# Patient Record
Sex: Female | Born: 1935
Health system: Southern US, Community
[De-identification: ages and names within clinical notes are randomized; demographics above are authoritative.]

## PROBLEM LIST (undated history)

## (undated) ENCOUNTER — Emergency Department (HOSPITAL_COMMUNITY): Admission: EM | Discharge status: Absent without leave | Payer: Self-pay

## (undated) DIAGNOSIS — J90 Pleural effusion, not elsewhere classified: Secondary | ICD-10-CM

## (undated) DIAGNOSIS — E039 Hypothyroidism, unspecified: Secondary | ICD-10-CM

## (undated) DIAGNOSIS — F419 Anxiety disorder, unspecified: Secondary | ICD-10-CM

## (undated) DIAGNOSIS — C8590 Non-Hodgkin lymphoma, unspecified, unspecified site: Secondary | ICD-10-CM

## (undated) DIAGNOSIS — R600 Localized edema: Secondary | ICD-10-CM

## (undated) DIAGNOSIS — E559 Vitamin D deficiency, unspecified: Secondary | ICD-10-CM

## (undated) DIAGNOSIS — R059 Cough, unspecified: Secondary | ICD-10-CM

## (undated) DIAGNOSIS — I509 Heart failure, unspecified: Secondary | ICD-10-CM

## (undated) DIAGNOSIS — R05 Cough: Secondary | ICD-10-CM

## (undated) DIAGNOSIS — F329 Major depressive disorder, single episode, unspecified: Secondary | ICD-10-CM

## (undated) DIAGNOSIS — I10 Essential (primary) hypertension: Secondary | ICD-10-CM

## (undated) DIAGNOSIS — Z0000 Encounter for general adult medical examination without abnormal findings: Secondary | ICD-10-CM

## (undated) DIAGNOSIS — D649 Anemia, unspecified: Secondary | ICD-10-CM

## (undated) DIAGNOSIS — R918 Other nonspecific abnormal finding of lung field: Secondary | ICD-10-CM

## (undated) DIAGNOSIS — Z973 Presence of spectacles and contact lenses: Secondary | ICD-10-CM

## (undated) DIAGNOSIS — F32A Depression, unspecified: Secondary | ICD-10-CM

## (undated) DIAGNOSIS — Z972 Presence of dental prosthetic device (complete) (partial): Secondary | ICD-10-CM

## (undated) DIAGNOSIS — K08109 Complete loss of teeth, unspecified cause, unspecified class: Secondary | ICD-10-CM

## (undated) DIAGNOSIS — R0600 Dyspnea, unspecified: Secondary | ICD-10-CM

## (undated) HISTORY — DX: Anemia, unspecified: D64.9

## (undated) HISTORY — DX: Hypothyroidism, unspecified: E03.9

## (undated) HISTORY — PX: MULTIPLE TOOTH EXTRACTIONS: SHX2053

## (undated) HISTORY — DX: Vitamin D deficiency, unspecified: E55.9

## (undated) HISTORY — DX: Encounter for general adult medical examination without abnormal findings: Z00.00

## (undated) HISTORY — DX: Hypercalcemia: E83.52

## (undated) HISTORY — DX: Anxiety disorder, unspecified: F41.9

## (undated) HISTORY — DX: Essential (primary) hypertension: I10

## (undated) HISTORY — DX: Heart failure, unspecified: I50.9

## (undated) HISTORY — PX: DILATION AND CURETTAGE OF UTERUS: SHX78

---
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
## 1960-11-16 HISTORY — PX: NASAL POLYP EXCISION: SHX2068

## 1999-11-17 DIAGNOSIS — C8590 Non-Hodgkin lymphoma, unspecified, unspecified site: Secondary | ICD-10-CM

## 1999-11-17 HISTORY — DX: Non-Hodgkin lymphoma, unspecified, unspecified site: C85.90

## 2000-08-03 ENCOUNTER — Other Ambulatory Visit: Admission: RE | Admit: 2000-08-03 | Discharge: 2000-08-03 | Payer: Self-pay | Admitting: Otolaryngology

## 2000-08-23 ENCOUNTER — Encounter: Payer: Self-pay | Admitting: Otolaryngology

## 2000-08-23 ENCOUNTER — Encounter: Admission: RE | Admit: 2000-08-23 | Discharge: 2000-08-23 | Payer: Self-pay | Admitting: Otolaryngology

## 2000-08-25 ENCOUNTER — Encounter (INDEPENDENT_AMBULATORY_CARE_PROVIDER_SITE_OTHER): Payer: Self-pay | Admitting: *Deleted

## 2000-08-25 ENCOUNTER — Ambulatory Visit (HOSPITAL_BASED_OUTPATIENT_CLINIC_OR_DEPARTMENT_OTHER): Admission: RE | Admit: 2000-08-25 | Discharge: 2000-08-25 | Payer: Self-pay | Admitting: Otolaryngology

## 2000-09-05 ENCOUNTER — Emergency Department (HOSPITAL_COMMUNITY): Admission: EM | Admit: 2000-09-05 | Discharge: 2000-09-05 | Payer: Self-pay | Admitting: Emergency Medicine

## 2000-09-14 ENCOUNTER — Ambulatory Visit (HOSPITAL_COMMUNITY): Admission: RE | Admit: 2000-09-14 | Discharge: 2000-09-14 | Payer: Self-pay | Admitting: *Deleted

## 2000-09-14 ENCOUNTER — Encounter: Payer: Self-pay | Admitting: *Deleted

## 2000-09-20 ENCOUNTER — Encounter (INDEPENDENT_AMBULATORY_CARE_PROVIDER_SITE_OTHER): Payer: Self-pay | Admitting: *Deleted

## 2000-09-20 ENCOUNTER — Ambulatory Visit (HOSPITAL_COMMUNITY): Admission: RE | Admit: 2000-09-20 | Discharge: 2000-09-20 | Payer: Self-pay | Admitting: *Deleted

## 2001-07-22 ENCOUNTER — Ambulatory Visit (HOSPITAL_COMMUNITY): Admission: RE | Admit: 2001-07-22 | Discharge: 2001-07-22 | Payer: Self-pay | Admitting: Hematology & Oncology

## 2001-07-22 ENCOUNTER — Encounter: Payer: Self-pay | Admitting: Hematology & Oncology

## 2001-07-26 ENCOUNTER — Ambulatory Visit (HOSPITAL_COMMUNITY): Admission: RE | Admit: 2001-07-26 | Discharge: 2001-07-26 | Payer: Self-pay | Admitting: Hematology & Oncology

## 2001-07-26 ENCOUNTER — Encounter: Payer: Self-pay | Admitting: Hematology & Oncology

## 2001-09-06 ENCOUNTER — Ambulatory Visit (HOSPITAL_COMMUNITY): Admission: RE | Admit: 2001-09-06 | Discharge: 2001-09-06 | Payer: Self-pay | Admitting: Hematology & Oncology

## 2001-09-06 ENCOUNTER — Encounter: Payer: Self-pay | Admitting: Hematology & Oncology

## 2001-11-03 ENCOUNTER — Ambulatory Visit (HOSPITAL_COMMUNITY): Admission: RE | Admit: 2001-11-03 | Discharge: 2001-11-03 | Payer: Self-pay | Admitting: Hematology & Oncology

## 2001-11-03 ENCOUNTER — Encounter: Payer: Self-pay | Admitting: Hematology & Oncology

## 2001-12-23 ENCOUNTER — Encounter: Payer: Self-pay | Admitting: Hematology & Oncology

## 2001-12-23 ENCOUNTER — Ambulatory Visit (HOSPITAL_COMMUNITY): Admission: RE | Admit: 2001-12-23 | Discharge: 2001-12-23 | Payer: Self-pay | Admitting: Hematology & Oncology

## 2002-04-07 ENCOUNTER — Encounter: Payer: Self-pay | Admitting: Hematology & Oncology

## 2002-04-07 ENCOUNTER — Ambulatory Visit (HOSPITAL_COMMUNITY): Admission: RE | Admit: 2002-04-07 | Discharge: 2002-04-07 | Payer: Self-pay | Admitting: Hematology & Oncology

## 2002-04-14 ENCOUNTER — Ambulatory Visit: Admission: RE | Admit: 2002-04-14 | Discharge: 2002-07-13 | Payer: Self-pay | Admitting: Radiation Oncology

## 2002-06-28 ENCOUNTER — Ambulatory Visit (HOSPITAL_COMMUNITY): Admission: RE | Admit: 2002-06-28 | Discharge: 2002-06-28 | Payer: Self-pay | Admitting: Hematology & Oncology

## 2002-06-28 ENCOUNTER — Encounter: Payer: Self-pay | Admitting: Hematology & Oncology

## 2002-09-26 ENCOUNTER — Encounter: Payer: Self-pay | Admitting: Hematology & Oncology

## 2002-09-26 ENCOUNTER — Ambulatory Visit (HOSPITAL_COMMUNITY): Admission: RE | Admit: 2002-09-26 | Discharge: 2002-09-26 | Payer: Self-pay | Admitting: Hematology & Oncology

## 2002-12-19 ENCOUNTER — Ambulatory Visit (HOSPITAL_COMMUNITY): Admission: RE | Admit: 2002-12-19 | Discharge: 2002-12-19 | Payer: Self-pay | Admitting: Hematology & Oncology

## 2002-12-19 ENCOUNTER — Encounter: Payer: Self-pay | Admitting: Hematology & Oncology

## 2003-03-26 ENCOUNTER — Ambulatory Visit (HOSPITAL_COMMUNITY): Admission: RE | Admit: 2003-03-26 | Discharge: 2003-03-26 | Payer: Self-pay | Admitting: Hematology & Oncology

## 2003-03-26 ENCOUNTER — Encounter: Payer: Self-pay | Admitting: Hematology & Oncology

## 2003-07-30 ENCOUNTER — Ambulatory Visit (HOSPITAL_COMMUNITY): Admission: RE | Admit: 2003-07-30 | Discharge: 2003-07-30 | Payer: Self-pay | Admitting: Hematology & Oncology

## 2003-07-30 ENCOUNTER — Encounter: Payer: Self-pay | Admitting: Hematology & Oncology

## 2003-12-24 ENCOUNTER — Ambulatory Visit (HOSPITAL_COMMUNITY): Admission: RE | Admit: 2003-12-24 | Discharge: 2003-12-24 | Payer: Self-pay | Admitting: Hematology & Oncology

## 2004-04-08 ENCOUNTER — Ambulatory Visit (HOSPITAL_COMMUNITY): Admission: RE | Admit: 2004-04-08 | Discharge: 2004-04-08 | Payer: Self-pay | Admitting: Hematology & Oncology

## 2004-08-25 ENCOUNTER — Ambulatory Visit (HOSPITAL_COMMUNITY): Admission: RE | Admit: 2004-08-25 | Discharge: 2004-08-25 | Payer: Self-pay | Admitting: Hematology & Oncology

## 2004-12-12 ENCOUNTER — Ambulatory Visit: Payer: Self-pay | Admitting: Hematology & Oncology

## 2005-02-20 ENCOUNTER — Ambulatory Visit: Payer: Self-pay | Admitting: Hematology & Oncology

## 2005-02-24 ENCOUNTER — Ambulatory Visit (HOSPITAL_COMMUNITY): Admission: RE | Admit: 2005-02-24 | Discharge: 2005-02-24 | Payer: Self-pay | Admitting: Hematology & Oncology

## 2005-04-10 ENCOUNTER — Ambulatory Visit: Payer: Self-pay | Admitting: Hematology & Oncology

## 2005-07-06 ENCOUNTER — Ambulatory Visit: Payer: Self-pay | Admitting: Hematology & Oncology

## 2005-07-07 ENCOUNTER — Emergency Department (HOSPITAL_COMMUNITY): Admission: EM | Admit: 2005-07-07 | Discharge: 2005-07-07 | Payer: Self-pay | Admitting: Emergency Medicine

## 2005-08-21 ENCOUNTER — Ambulatory Visit: Payer: Self-pay | Admitting: Hematology & Oncology

## 2005-08-27 ENCOUNTER — Ambulatory Visit (HOSPITAL_COMMUNITY): Admission: RE | Admit: 2005-08-27 | Discharge: 2005-08-27 | Payer: Self-pay | Admitting: Hematology & Oncology

## 2005-09-15 ENCOUNTER — Ambulatory Visit (HOSPITAL_COMMUNITY): Admission: RE | Admit: 2005-09-15 | Discharge: 2005-09-15 | Payer: Self-pay | Admitting: Hematology & Oncology

## 2006-02-22 ENCOUNTER — Ambulatory Visit: Payer: Self-pay | Admitting: Hematology & Oncology

## 2006-02-22 LAB — CBC WITH DIFFERENTIAL/PLATELET
BASO%: 0.6 % (ref 0.0–2.0)
Basophils Absolute: 0 10*3/uL (ref 0.0–0.1)
EOS%: 3.1 % (ref 0.0–7.0)
HCT: 40.5 % (ref 34.8–46.6)
HGB: 13.6 g/dL (ref 11.6–15.9)
LYMPH%: 42.6 % (ref 14.0–48.0)
MCH: 29 pg (ref 26.0–34.0)
MCHC: 33.4 g/dL (ref 32.0–36.0)
MCV: 86.7 fL (ref 81.0–101.0)
MONO%: 13.7 % — ABNORMAL HIGH (ref 0.0–13.0)
NEUT%: 40 % (ref 39.6–76.8)
lymph#: 0.9 10*3/uL (ref 0.9–3.3)

## 2006-02-22 LAB — COMPREHENSIVE METABOLIC PANEL
AST: 42 U/L — ABNORMAL HIGH (ref 0–37)
Alkaline Phosphatase: 140 U/L — ABNORMAL HIGH (ref 39–117)
BUN: 15 mg/dL (ref 6–23)
Calcium: 9.5 mg/dL (ref 8.4–10.5)
Chloride: 106 mEq/L (ref 96–112)
Creatinine, Ser: 0.8 mg/dL (ref 0.4–1.2)
Total Bilirubin: 0.6 mg/dL (ref 0.3–1.2)

## 2006-02-25 ENCOUNTER — Ambulatory Visit (HOSPITAL_COMMUNITY): Admission: RE | Admit: 2006-02-25 | Discharge: 2006-02-25 | Payer: Self-pay | Admitting: Hematology & Oncology

## 2006-03-19 ENCOUNTER — Ambulatory Visit: Payer: Self-pay | Admitting: Gastroenterology

## 2006-04-09 ENCOUNTER — Ambulatory Visit: Payer: Self-pay | Admitting: Gastroenterology

## 2006-08-26 ENCOUNTER — Ambulatory Visit: Payer: Self-pay | Admitting: Hematology & Oncology

## 2007-02-23 ENCOUNTER — Ambulatory Visit: Payer: Self-pay | Admitting: Hematology & Oncology

## 2007-02-28 LAB — CBC WITH DIFFERENTIAL/PLATELET
Basophils Absolute: 0 10*3/uL (ref 0.0–0.1)
Eosinophils Absolute: 0.1 10*3/uL (ref 0.0–0.5)
HGB: 13.6 g/dL (ref 11.6–15.9)
MCV: 85.1 fL (ref 81.0–101.0)
MONO%: 12.9 % (ref 0.0–13.0)
NEUT#: 1 10*3/uL — ABNORMAL LOW (ref 1.5–6.5)
RDW: 12.7 % (ref 11.3–14.5)
lymph#: 1 10*3/uL (ref 0.9–3.3)

## 2007-02-28 LAB — COMPREHENSIVE METABOLIC PANEL
Albumin: 4 g/dL (ref 3.5–5.2)
BUN: 8 mg/dL (ref 6–23)
Calcium: 9.6 mg/dL (ref 8.4–10.5)
Chloride: 107 mEq/L (ref 96–112)
Glucose, Bld: 100 mg/dL — ABNORMAL HIGH (ref 70–99)
Potassium: 4.5 mEq/L (ref 3.5–5.3)

## 2007-02-28 LAB — LACTATE DEHYDROGENASE: LDH: 203 U/L (ref 94–250)

## 2007-08-25 ENCOUNTER — Ambulatory Visit: Payer: Self-pay | Admitting: Hematology & Oncology

## 2007-08-29 LAB — COMPREHENSIVE METABOLIC PANEL
ALT: 34 U/L (ref 0–35)
CO2: 25 mEq/L (ref 19–32)
Calcium: 9.4 mg/dL (ref 8.4–10.5)
Chloride: 106 mEq/L (ref 96–112)
Sodium: 142 mEq/L (ref 135–145)
Total Protein: 8.2 g/dL (ref 6.0–8.3)

## 2007-08-29 LAB — CBC WITH DIFFERENTIAL/PLATELET
BASO%: 1.1 % (ref 0.0–2.0)
HCT: 40.6 % (ref 34.8–46.6)
MCHC: 34.2 g/dL (ref 32.0–36.0)
MONO#: 0.3 10*3/uL (ref 0.1–0.9)
RBC: 4.64 10*6/uL (ref 3.70–5.32)
WBC: 2.8 10*3/uL — ABNORMAL LOW (ref 3.9–10.0)
lymph#: 1.2 10*3/uL (ref 0.9–3.3)

## 2007-08-29 LAB — LACTATE DEHYDROGENASE: LDH: 190 U/L (ref 94–250)

## 2008-02-22 ENCOUNTER — Ambulatory Visit: Payer: Self-pay | Admitting: Hematology & Oncology

## 2008-02-27 LAB — CBC WITH DIFFERENTIAL/PLATELET
BASO%: 0.7 % (ref 0.0–2.0)
EOS%: 2.6 % (ref 0.0–7.0)
MCH: 29.4 pg (ref 26.0–34.0)
MCHC: 34.4 g/dL (ref 32.0–36.0)
MONO#: 0.3 10*3/uL (ref 0.1–0.9)
RBC: 4.72 10*6/uL (ref 3.70–5.32)
WBC: 2.7 10*3/uL — ABNORMAL LOW (ref 3.9–10.0)
lymph#: 1.1 10*3/uL (ref 0.9–3.3)

## 2008-02-27 LAB — LACTATE DEHYDROGENASE: LDH: 178 U/L (ref 94–250)

## 2008-02-27 LAB — COMPREHENSIVE METABOLIC PANEL
ALT: 29 U/L (ref 0–35)
AST: 24 U/L (ref 0–37)
CO2: 27 mEq/L (ref 19–32)
Calcium: 9.5 mg/dL (ref 8.4–10.5)
Chloride: 103 mEq/L (ref 96–112)
Creatinine, Ser: 0.87 mg/dL (ref 0.40–1.20)
Sodium: 139 mEq/L (ref 135–145)
Total Bilirubin: 0.6 mg/dL (ref 0.3–1.2)
Total Protein: 8 g/dL (ref 6.0–8.3)

## 2008-08-29 ENCOUNTER — Ambulatory Visit: Payer: Self-pay | Admitting: Hematology & Oncology

## 2008-08-30 ENCOUNTER — Ambulatory Visit (HOSPITAL_BASED_OUTPATIENT_CLINIC_OR_DEPARTMENT_OTHER): Admission: RE | Admit: 2008-08-30 | Discharge: 2008-08-30 | Payer: Self-pay | Admitting: Hematology & Oncology

## 2008-08-30 LAB — CBC WITH DIFFERENTIAL (CANCER CENTER ONLY)
BASO#: 0 10*3/uL (ref 0.0–0.2)
EOS%: 2.5 % (ref 0.0–7.0)
Eosinophils Absolute: 0.1 10*3/uL (ref 0.0–0.5)
HCT: 39.2 % (ref 34.8–46.6)
HGB: 13.4 g/dL (ref 11.6–15.9)
LYMPH#: 1.1 10*3/uL (ref 0.9–3.3)
MONO#: 0.2 10*3/uL (ref 0.1–0.9)
NEUT#: 1.3 10*3/uL — ABNORMAL LOW (ref 1.5–6.5)
NEUT%: 46.7 % (ref 39.6–80.0)
RBC: 4.68 10*6/uL (ref 3.70–5.32)
WBC: 2.7 10*3/uL — ABNORMAL LOW (ref 3.9–10.0)

## 2008-08-30 LAB — COMPREHENSIVE METABOLIC PANEL
AST: 25 U/L (ref 0–37)
BUN: 18 mg/dL (ref 6–23)
CO2: 26 mEq/L (ref 19–32)
Calcium: 9.6 mg/dL (ref 8.4–10.5)
Chloride: 106 mEq/L (ref 96–112)
Creatinine, Ser: 0.83 mg/dL (ref 0.40–1.20)

## 2008-08-30 LAB — LACTATE DEHYDROGENASE: LDH: 184 U/L (ref 94–250)

## 2009-02-26 ENCOUNTER — Ambulatory Visit: Payer: Self-pay | Admitting: Hematology & Oncology

## 2009-02-27 LAB — CBC WITH DIFFERENTIAL (CANCER CENTER ONLY)
BASO#: 0 10*3/uL (ref 0.0–0.2)
Eosinophils Absolute: 0.1 10*3/uL (ref 0.0–0.5)
HGB: 12.7 g/dL (ref 11.6–15.9)
LYMPH#: 1.2 10*3/uL (ref 0.9–3.3)
MCH: 28.3 pg (ref 26.0–34.0)
MONO#: 0.2 10*3/uL (ref 0.1–0.9)
NEUT#: 1.4 10*3/uL — ABNORMAL LOW (ref 1.5–6.5)
RBC: 4.5 10*6/uL (ref 3.70–5.32)
WBC: 2.9 10*3/uL — ABNORMAL LOW (ref 3.9–10.0)

## 2009-02-27 LAB — COMPREHENSIVE METABOLIC PANEL
AST: 29 U/L (ref 0–37)
Albumin: 3.9 g/dL (ref 3.5–5.2)
Alkaline Phosphatase: 111 U/L (ref 39–117)
BUN: 11 mg/dL (ref 6–23)
Potassium: 4 mEq/L (ref 3.5–5.3)

## 2009-08-26 ENCOUNTER — Ambulatory Visit: Payer: Self-pay | Admitting: Hematology & Oncology

## 2009-08-26 LAB — CBC WITH DIFFERENTIAL (CANCER CENTER ONLY)
BASO#: 0 10*3/uL (ref 0.0–0.2)
BASO%: 0.6 % (ref 0.0–2.0)
EOS%: 2.5 % (ref 0.0–7.0)
HGB: 13.7 g/dL (ref 11.6–15.9)
LYMPH#: 1.4 10*3/uL (ref 0.9–3.3)
MCHC: 33.4 g/dL (ref 32.0–36.0)
NEUT#: 1.7 10*3/uL (ref 1.5–6.5)
Platelets: 172 10*3/uL (ref 145–400)
RBC: 4.79 10*6/uL (ref 3.70–5.32)

## 2009-08-26 LAB — COMPREHENSIVE METABOLIC PANEL
ALT: 62 U/L — ABNORMAL HIGH (ref 0–35)
AST: 33 U/L (ref 0–37)
Albumin: 3.8 g/dL (ref 3.5–5.2)
BUN: 17 mg/dL (ref 6–23)
Calcium: 9.4 mg/dL (ref 8.4–10.5)
Chloride: 101 mEq/L (ref 96–112)
Potassium: 3.5 mEq/L (ref 3.5–5.3)
Sodium: 138 mEq/L (ref 135–145)
Total Protein: 8.2 g/dL (ref 6.0–8.3)

## 2009-08-26 LAB — CHCC SATELLITE - SMEAR

## 2009-08-26 LAB — LACTATE DEHYDROGENASE: LDH: 204 U/L (ref 94–250)

## 2009-08-26 LAB — RETICULOCYTES (CHCC): ABS Retic: 38.9 10*3/uL (ref 19.0–186.0)

## 2009-09-05 ENCOUNTER — Ambulatory Visit: Payer: Self-pay | Admitting: Internal Medicine

## 2009-09-05 ENCOUNTER — Inpatient Hospital Stay (HOSPITAL_COMMUNITY): Admission: EM | Admit: 2009-09-05 | Discharge: 2009-09-12 | Payer: Self-pay | Admitting: Emergency Medicine

## 2009-09-06 ENCOUNTER — Encounter (INDEPENDENT_AMBULATORY_CARE_PROVIDER_SITE_OTHER): Payer: Self-pay | Admitting: Internal Medicine

## 2009-09-06 ENCOUNTER — Encounter (INDEPENDENT_AMBULATORY_CARE_PROVIDER_SITE_OTHER): Payer: Self-pay | Admitting: Radiology

## 2009-09-06 ENCOUNTER — Ambulatory Visit: Payer: Self-pay | Admitting: Hematology & Oncology

## 2009-09-07 ENCOUNTER — Encounter (INDEPENDENT_AMBULATORY_CARE_PROVIDER_SITE_OTHER): Payer: Self-pay | Admitting: Interventional Radiology

## 2009-09-09 ENCOUNTER — Ambulatory Visit: Payer: Self-pay | Admitting: Oncology

## 2009-09-10 ENCOUNTER — Other Ambulatory Visit: Payer: Self-pay | Admitting: Cardiology

## 2009-09-20 DIAGNOSIS — I428 Other cardiomyopathies: Secondary | ICD-10-CM | POA: Insufficient documentation

## 2009-09-20 DIAGNOSIS — F411 Generalized anxiety disorder: Secondary | ICD-10-CM | POA: Insufficient documentation

## 2009-09-20 DIAGNOSIS — I509 Heart failure, unspecified: Secondary | ICD-10-CM | POA: Insufficient documentation

## 2009-09-20 DIAGNOSIS — Z87898 Personal history of other specified conditions: Secondary | ICD-10-CM | POA: Insufficient documentation

## 2009-09-20 DIAGNOSIS — I10 Essential (primary) hypertension: Secondary | ICD-10-CM | POA: Insufficient documentation

## 2009-09-23 ENCOUNTER — Encounter: Payer: Self-pay | Admitting: Nurse Practitioner

## 2009-09-23 ENCOUNTER — Ambulatory Visit: Payer: Self-pay | Admitting: Internal Medicine

## 2009-09-24 LAB — CONVERTED CEMR LAB
Chloride: 99 meq/L (ref 96–112)
Potassium: 4.5 meq/L (ref 3.5–5.1)

## 2009-09-25 ENCOUNTER — Telehealth: Payer: Self-pay | Admitting: Cardiovascular Disease

## 2009-10-01 ENCOUNTER — Telehealth: Payer: Self-pay | Admitting: Cardiovascular Disease

## 2009-10-18 ENCOUNTER — Ambulatory Visit: Payer: Self-pay | Admitting: Hematology & Oncology

## 2009-10-21 ENCOUNTER — Encounter: Payer: Self-pay | Admitting: Internal Medicine

## 2009-10-21 LAB — COMPREHENSIVE METABOLIC PANEL
Alkaline Phosphatase: 99 U/L (ref 39–117)
Glucose, Bld: 143 mg/dL — ABNORMAL HIGH (ref 70–99)
Sodium: 138 mEq/L (ref 135–145)
Total Bilirubin: 0.3 mg/dL (ref 0.3–1.2)
Total Protein: 8.1 g/dL (ref 6.0–8.3)

## 2009-10-21 LAB — CBC WITH DIFFERENTIAL (CANCER CENTER ONLY)
BASO%: 0.4 % (ref 0.0–2.0)
EOS%: 2.6 % (ref 0.0–7.0)
LYMPH#: 1 10*3/uL (ref 0.9–3.3)
MCHC: 34.4 g/dL (ref 32.0–36.0)
NEUT#: 1.6 10*3/uL (ref 1.5–6.5)
NEUT%: 55.3 % (ref 39.6–80.0)
RDW: 12.2 % (ref 10.5–14.6)

## 2009-10-21 LAB — BRAIN NATRIURETIC PEPTIDE: Brain Natriuretic Peptide: 325 pg/mL — ABNORMAL HIGH (ref 0.0–100.0)

## 2009-11-06 ENCOUNTER — Encounter: Payer: Self-pay | Admitting: Internal Medicine

## 2009-11-06 ENCOUNTER — Ambulatory Visit: Payer: Self-pay | Admitting: Internal Medicine

## 2009-11-06 ENCOUNTER — Ambulatory Visit: Payer: Self-pay

## 2009-11-06 ENCOUNTER — Ambulatory Visit: Payer: Self-pay | Admitting: Cardiology

## 2009-11-06 ENCOUNTER — Ambulatory Visit (HOSPITAL_COMMUNITY): Admission: RE | Admit: 2009-11-06 | Discharge: 2009-11-06 | Payer: Self-pay | Admitting: Internal Medicine

## 2009-11-06 ENCOUNTER — Ambulatory Visit: Payer: Self-pay | Admitting: Vascular Surgery

## 2009-11-06 ENCOUNTER — Emergency Department (HOSPITAL_COMMUNITY): Admission: EM | Admit: 2009-11-06 | Discharge: 2009-11-06 | Payer: Self-pay | Admitting: Emergency Medicine

## 2009-11-06 DIAGNOSIS — M79609 Pain in unspecified limb: Secondary | ICD-10-CM

## 2009-11-18 ENCOUNTER — Encounter: Payer: Self-pay | Admitting: Internal Medicine

## 2009-11-18 ENCOUNTER — Ambulatory Visit: Payer: Self-pay | Admitting: Vascular Surgery

## 2009-12-20 ENCOUNTER — Ambulatory Visit: Payer: Self-pay | Admitting: Hematology & Oncology

## 2010-01-09 ENCOUNTER — Ambulatory Visit: Payer: Self-pay | Admitting: Internal Medicine

## 2010-01-09 ENCOUNTER — Telehealth: Payer: Self-pay | Admitting: Internal Medicine

## 2010-01-09 ENCOUNTER — Encounter: Payer: Self-pay | Admitting: Internal Medicine

## 2010-01-09 DIAGNOSIS — C8590 Non-Hodgkin lymphoma, unspecified, unspecified site: Secondary | ICD-10-CM

## 2010-01-09 DIAGNOSIS — I7300 Raynaud's syndrome without gangrene: Secondary | ICD-10-CM | POA: Insufficient documentation

## 2010-01-24 ENCOUNTER — Ambulatory Visit: Payer: Self-pay | Admitting: Internal Medicine

## 2010-01-24 DIAGNOSIS — R599 Enlarged lymph nodes, unspecified: Secondary | ICD-10-CM | POA: Insufficient documentation

## 2010-01-24 DIAGNOSIS — E039 Hypothyroidism, unspecified: Secondary | ICD-10-CM

## 2010-01-24 DIAGNOSIS — R799 Abnormal finding of blood chemistry, unspecified: Secondary | ICD-10-CM

## 2010-02-21 ENCOUNTER — Ambulatory Visit: Payer: Self-pay | Admitting: Hematology & Oncology

## 2010-02-26 ENCOUNTER — Encounter: Payer: Self-pay | Admitting: Internal Medicine

## 2010-02-26 LAB — CONVERTED CEMR LAB
Cholesterol: 175 mg/dL (ref 0–200)
HCV Ab: NEGATIVE
LDL Cholesterol: 105 mg/dL — ABNORMAL HIGH (ref 0–99)
TSH: 2.704 microintl units/mL (ref 0.350–4.500)
Total CHOL/HDL Ratio: 3.4
Triglycerides: 92 mg/dL (ref <150)

## 2010-03-02 ENCOUNTER — Encounter: Payer: Self-pay | Admitting: Internal Medicine

## 2010-03-27 ENCOUNTER — Ambulatory Visit: Payer: Self-pay | Admitting: Internal Medicine

## 2010-06-17 ENCOUNTER — Ambulatory Visit: Payer: Self-pay | Admitting: Hematology & Oncology

## 2010-06-19 ENCOUNTER — Encounter: Payer: Self-pay | Admitting: Internal Medicine

## 2010-06-19 LAB — CBC WITH DIFFERENTIAL (CANCER CENTER ONLY)
BASO%: 0.3 % (ref 0.0–2.0)
EOS%: 3.1 % (ref 0.0–7.0)
HCT: 36.6 % (ref 34.8–46.6)
LYMPH#: 1.4 10*3/uL (ref 0.9–3.3)
MCHC: 34 g/dL (ref 32.0–36.0)
MONO#: 0.2 10*3/uL (ref 0.1–0.9)
MONO%: 6.3 % (ref 0.0–13.0)
NEUT#: 1.8 10*3/uL (ref 1.5–6.5)

## 2010-06-19 LAB — COMPREHENSIVE METABOLIC PANEL
Alkaline Phosphatase: 114 U/L (ref 39–117)
CO2: 28 mEq/L (ref 19–32)
Creatinine, Ser: 0.88 mg/dL (ref 0.40–1.20)
Glucose, Bld: 103 mg/dL — ABNORMAL HIGH (ref 70–99)
Total Bilirubin: 0.6 mg/dL (ref 0.3–1.2)

## 2010-06-19 LAB — LACTATE DEHYDROGENASE: LDH: 171 U/L (ref 94–250)

## 2010-09-25 LAB — CONVERTED CEMR LAB
CO2: 29 meq/L (ref 19–32)
Calcium: 10.1 mg/dL (ref 8.4–10.5)
Creatinine, Ser: 1.04 mg/dL (ref 0.40–1.20)
Glucose, Bld: 95 mg/dL (ref 70–99)
Sodium: 141 meq/L (ref 135–145)
TSH: 2.734 microintl units/mL (ref 0.350–4.500)
Thyroglobulin Ab: 60.6 — ABNORMAL HIGH (ref <40.0)

## 2010-10-02 ENCOUNTER — Ambulatory Visit: Payer: Self-pay | Admitting: Internal Medicine

## 2010-10-02 DIAGNOSIS — H9319 Tinnitus, unspecified ear: Secondary | ICD-10-CM | POA: Insufficient documentation

## 2010-10-02 DIAGNOSIS — D696 Thrombocytopenia, unspecified: Secondary | ICD-10-CM

## 2010-10-15 ENCOUNTER — Ambulatory Visit: Payer: Self-pay | Admitting: Hematology & Oncology

## 2010-10-16 ENCOUNTER — Encounter: Payer: Self-pay | Admitting: Internal Medicine

## 2010-10-16 ENCOUNTER — Ambulatory Visit (HOSPITAL_BASED_OUTPATIENT_CLINIC_OR_DEPARTMENT_OTHER)
Admission: RE | Admit: 2010-10-16 | Discharge: 2010-10-16 | Payer: Self-pay | Source: Home / Self Care | Admitting: Hematology & Oncology

## 2010-10-16 LAB — CBC WITH DIFFERENTIAL (CANCER CENTER ONLY)
BASO#: 0 10*3/uL (ref 0.0–0.2)
BASO%: 0.6 % (ref 0.0–2.0)
Eosinophils Absolute: 0.1 10*3/uL (ref 0.0–0.5)
HCT: 38.3 % (ref 34.8–46.6)
HGB: 12.7 g/dL (ref 11.6–15.9)
LYMPH#: 1.5 10*3/uL (ref 0.9–3.3)
MCV: 87 fL (ref 81–101)
MONO#: 0.3 10*3/uL (ref 0.1–0.9)
NEUT%: 50.7 % (ref 39.6–80.0)
WBC: 3.9 10*3/uL (ref 3.9–10.0)

## 2010-10-16 LAB — COMPREHENSIVE METABOLIC PANEL
ALT: 34 U/L (ref 0–35)
BUN: 21 mg/dL (ref 6–23)
CO2: 24 mEq/L (ref 19–32)
Calcium: 9.7 mg/dL (ref 8.4–10.5)
Chloride: 100 mEq/L (ref 96–112)
Creatinine, Ser: 1.01 mg/dL (ref 0.40–1.20)
Glucose, Bld: 97 mg/dL (ref 70–99)

## 2010-10-22 ENCOUNTER — Encounter: Payer: Self-pay | Admitting: Cardiology

## 2010-10-22 ENCOUNTER — Ambulatory Visit: Payer: Self-pay

## 2010-10-22 ENCOUNTER — Encounter: Payer: Self-pay | Admitting: Internal Medicine

## 2010-10-22 ENCOUNTER — Ambulatory Visit (HOSPITAL_COMMUNITY)
Admission: RE | Admit: 2010-10-22 | Discharge: 2010-10-22 | Payer: Self-pay | Source: Home / Self Care | Attending: Internal Medicine | Admitting: Internal Medicine

## 2010-12-14 LAB — CONVERTED CEMR LAB
AST: 24 units/L (ref 0–37)
Alkaline Phosphatase: 122 units/L — ABNORMAL HIGH (ref 39–117)
Anticardiolipin IgA: 3 (ref <10)
Anticardiolipin IgG: 3 (ref <10)
Anticardiolipin IgM: 9 (ref <10)
BUN: 14 mg/dL (ref 6–23)
Basophils Absolute: 0 10*3/uL (ref 0.0–0.1)
CO2: 27 meq/L (ref 19–32)
Chloride: 103 meq/L (ref 96–112)
Creatinine, Ser: 0.81 mg/dL (ref 0.40–1.20)
Free T4: 0.97 ng/dL (ref 0.80–1.80)
Glucose, Bld: 84 mg/dL (ref 70–99)
Hemoglobin: 12.3 g/dL (ref 12.0–15.0)
Indirect Bilirubin: 0.4 mg/dL (ref 0.0–0.9)
Lymphocytes Relative: 25 % (ref 12–46)
Monocytes Absolute: 0.2 10*3/uL (ref 0.1–1.0)
Monocytes Relative: 7 % (ref 3–12)
Platelets: 146 10*3/uL — ABNORMAL LOW (ref 150–400)
Potassium: 4.1 meq/L (ref 3.5–5.3)
RBC: 4.31 M/uL (ref 3.87–5.11)
TSH: 4.582 microintl units/mL — ABNORMAL HIGH (ref 0.350–4.500)
Triglycerides: 72 mg/dL (ref <150)
WBC: 3.2 10*3/uL — ABNORMAL LOW (ref 4.0–10.5)

## 2010-12-16 NOTE — Miscellaneous (Signed)
Summary: Orders Update  Clinical Lists Changes  Orders: Added new Test order of TLB-TSH (Thyroid Stimulating Hormone) [phone]) - Signed Added new Test order of TLB-Lipid Panel ([id number]) - Signed Added new Test order of T-Hepatitis C Antibody ([id number]) - Signed Added new Test order of T-RPR (Syphilis) ([id number]) - Signed Added new Test order of T- * Misc. Laboratory test [phone]) - Signed

## 2010-12-16 NOTE — Letter (Signed)
[facility]   [facility]   Imported By: Roderic Ovens 12/09/2009 12:48:13  _____________________________________________________________________  External Attachment:    Type:   Image     Comment:   External Document

## 2010-12-16 NOTE — Assessment & Plan Note (Signed)
Summary: new to be est/mhf   Vital Signs:  Patient profile:   77 year old female Height:      62.25 inches Weight:      129.75 pounds BMI:     23.63 O2 Sat:      99 % on Room air Temp:     97.9 degrees F oral Pulse rate:   86 / minute Pulse rhythm:   irregular Resp:     16 per minute BP sitting:   170 / 80  (right arm) Cuff size:   regular  Vitals Entered By: Glendell Docker CMA (January 09, 2010 9:27 AM)  O2 Flow:  Room air CC: RM 2- New Patient Comments no concerns would like to establish care, fasting for labs if needed, questiong about 5k race    Primary Care Provider:  Dondra Spry DO  CC:  RM 2- New Patient.  History of Present Illness: 77 y/o AA female to est.   She has hx of cardiomyopathy diagnosed in Oct, 2010.  Pt admited with dyspnea and orthopnea.   Cardiac cath was negative for obstructive CAD.  She is followed by cardiology.   It is unclear why she developed global cardiomyopathy.  no prev illness.   Hx of lymphoma - followed by Dr. Myna Hidalgo She finished chemo in 2003 Her f/u 2D echo in  10/2009    showed improved EF to 35-40 %.     Left ventricle: The cavity size was normal. Wall thickness was       normal. Systolic function was moderately reduced. The estimated       ejection fraction was in the range of 35% to 40%. Diffuse       hypokinesis. Doppler parameters are consistent with abnormal left       ventricular relaxation (grade 1 diastolic dysfunction).     - Mitral valve: Moderate to severe regurgitation.     - Left atrium: The atrium was mildly dilated.     - Pericardium, extracardiac: A trivial pericardial effusion was       identified.   She was started on Coreg but may have experienced side effect.  both of her toes turned blue. She was  seen by vascular specialist.  ABIs were normal.  Coreg /b blocker was suspected as potential contributing factor.  she reports hx of raynaud's symptoms. her hands and toes change color when exposed to cold  temperature.  no hx of connective tissue dz.  she denies rash, arthralgias, oral sores and alopecia.  Preventive Screening-Counseling & Management  Alcohol-Tobacco     Alcohol drinks/day: 0     Smoking Status: never  Caffeine-Diet-Exercise     Caffeine use/day: None     Does Patient Exercise: yes     Times/week: 7  Allergies: No Known Drug Allergies  Past History:  Past Medical History: CARDIOMYOPATHY (ICD-425.4)-  Nonischemic      a.   NL cors on cath 08/2009, EF 15-20% CONGESTIVE HEART FAILURE (ICD-428.0) HYPERTENSION (ICD-401.9) ANXIETY (ICD-300.00)  LYMPHOMA, HX OF (ICD-V10.79)   Past Surgical History: Tonsillectomy as a child     Family History:  The patient's mother died at age 61  secondary to diabetes. Her father died at age 68 due to complications of  pneumonia.  No obvious premature cardiovascular disease or familial cardiomyopathy is noted.      Social History:  The patient is married ( husband Alegandra Sommers)  She just recently celebrated   her 52nd anniversary.  She has  five children.  There is no active   tobacco or alcohol use history.      Smoking Status:  never Caffeine use/day:  None Does Patient Exercise:  yes  Review of Systems       feet turned blue after starting carvedilol.    Physical Exam  General:  alert, well-developed, and well-nourished.   Head:  normocephalic and atraumatic.   Neck:  supple without bruits or JVD. Lungs:  normal respiratory effort and normal breath sounds.   Heart:  normal rate and irregular rhythm.  S4 Abdomen:  soft, non-tender, normal bowel sounds, no masses, no hepatomegaly, and no splenomegaly.   Pulses:  dorsalis pedis and posterior tibial pulses are full and equal bilaterally Extremities:  trace left pedal edema and trace right pedal edema.   Neurologic:  cranial nerves II-XII intact and gait normal.     Impression & Recommendations:  Problem # 1:  CARDIOMYOPATHY (ICD-425.4) non ischemic cardiomyopathy  of unclear etiology.  check TFTs Orders: T-TSH ([id number]) EKG w/ Interpretation (93000)  Problem # 2:  NON-HODGKIN'S LYMPHOMA (ICD-202.80)  diagnosed in 2001.  lymph nodes of neck was enlarged.  completed chemo and radiation in 2003.    Orders: T-BNP  (B Natriuretic Peptide) [id number]) T-Hepatic Function [id number]) T-Lipid Profile [phone]) T-T4, Free [phone])  Problem # 3:  HYPERTENSION (ICD-401.9) she has not been taking coreg.   change to bisoprolol.  we discussed importance of medication compliance  The following medications were removed from the medication list:    Carvedilol 6.25 Mg Tabs (Carvedilol) .Marland Kitchen... Take 1/2 tablet by mouth twice a day    Lasix 40 Mg Tabs (Furosemide) ..... I tab b.i.d. Her updated medication list for this problem includes:    Lisinopril 10 Mg Tabs (Lisinopril) .Marland Kitchen... 1 by mouth dialy    Bisoprolol Fumarate 5 Mg Tabs (Bisoprolol fumarate) ..... One by mouth once daily    Nifedipine Cr Osmotic 30 Mg Xr24h-tab (Nifedipine) ..... One by mouth once daily  Orders: T-Basic Metabolic Panel [id number]) T-CBC w/Diff [id number])  BP today: 170/80 Prior BP: 152/76 (11/06/2009)  Labs Reviewed: K+: 4.5 (09/23/2009) Creat: : 0.9 (09/23/2009)     Problem # 4:  RAYNAUDS SYNDROME (ICD-443.0) Hx of raynauds.  rule out secondary cause  Orders: T-Rheumatoid Factor [phone]) T-Antinuclear Antib (ANA) [id number]) T- * Misc. Laboratory test [id number])  Complete Medication List: 1)  Digoxin 0.125 Mg Tabs (Digoxin) .Marland Kitchen.. 1 by mouth daily 2)  Lisinopril 10 Mg Tabs (Lisinopril) .Marland Kitchen.. 1 by mouth dialy 3)  Centrum Silver Tabs (Multiple vitamins-minerals) .... Take 1 tablet by mouth once a day 4)  Bisoprolol Fumarate 5 Mg Tabs (Bisoprolol fumarate) .... One by mouth once daily 5)  Nifedipine Cr Osmotic 30 Mg Xr24h-tab (Nifedipine) .... One by mouth once daily  Patient Instructions: 1)  Please schedule a follow-up appointment in 1 week 2)   BMP prior to visit, ICD-9: 401.9 3)  BNP:  425.4 4)  Hepatic Panel prior to visit, ICD-9: 425.4 5)  Lipid Panel prior to visit, ICD-9: 425.4 6)  TSH, Free T4 prior to visit, ICD-9: 425.4 7)  CBC w/ Diff prior to visit, ICD-9: 401.9 8)  ANA,  RF,  Anti phospholipid syndrome panel  (8017) -443.0 9)  Please return for lab work on Monday Prescriptions: NIFEDIPINE CR OSMOTIC 30 MG XR24H-TAB (NIFEDIPINE) one by mouth once daily  #30 x 2   Entered and Authorized by:   D. Thomos Lemons DO   Signed by:   D. Thomos Lemons  DO on 01/09/2010   Method used:   Electronically to        [id number] (retail)       [address]       [place], [place]  [address]       Ph: [phone] or [phone]       Fax: [phone]   RxID:   [id number] BISOPROLOL FUMARATE 5 MG TABS (BISOPROLOL FUMARATE) one by mouth once daily  #30 x 1   Entered and Authorized by:   D. Thomos Lemons DO   Signed by:   D. Thomos Lemons DO on 01/09/2010   Method used:   Electronically to        [id number]* (retail)       [address]       [place], [place]  [address]       Ph: [phone] or [phone]       Fax: [phone]   RxID:   [id number]    Immunization History:  Influenza Immunization History:    Influenza:  declined (01/09/2010)   Contraindications/Deferment of Procedures/Staging:    Test/Procedure: FLU VAX    Reason for deferment: patient declined    Preventive Care Screening  Last Flu Shot:    Date:  01/09/2010    Results:  Declined  Mammogram:    Date:  09/11/2008    Results:  normal   Colonoscopy:    Date:  05/10/2007    Results:  normal

## 2010-12-16 NOTE — Consult Note (Signed)
[facility]   Imported By: Roderic Ovens 11/29/2009 16:42:51  _____________________________________________________________________  External Attachment:    Type:   Image     Comment:   External Document

## 2010-12-16 NOTE — Assessment & Plan Note (Signed)
Summary: 1 weejk follow up/mhf   Vital Signs:  Patient profile:   77 year old female Weight:      130.25 pounds BMI:     23.72 O2 Sat:      98 % on Room air Temp:     97.8 degrees F oral Pulse rate:   55 / minute Pulse rhythm:   irregular Resp:     18 per minute BP sitting:   140 / 60  (right arm) Cuff size:   regular  Vitals Entered By: Glendell Docker CMA (January 24, 2010 8:44 AM)  O2 Flow:  Room air  Contraindications/Deferment of Procedures/Staging:    Test/Procedure: PAP Smear    Reason for deferment: patient declined  CC: Rm 3- 1 Week Follow up  Comments no concerns, review of labs     Last PAP Result No Longer Done   Primary Care Provider:  Dondra Spry DO  CC:  Rm 3- 1 Week Follow up .  History of Present Illness: 77 y/o AA female with hx of lymphoma, non ischemic cardiomyopathy, and raynaud's for follow up  raynaud's much better since starting nifedipine. lab results reviewed  Htn - BP better.  no dizziness.  she gets anxious before doctor's visit  RF positive.   no arthralgias.  fam hx reviewed.  daughter and sister have sarcoid  Allergies (verified): No Known Drug Allergies  Past History:  Past Medical History: CARDIOMYOPATHY (ICD-425.4)-  Nonischemic      a.   NL cors on cath 08/2009, EF 15-20%  Her f/u 2D echo in  10/2009    showed improved EF to 35-40 %. CONGESTIVE HEART FAILURE (ICD-428.0) HYPERTENSION (ICD-401.9) ANXIETY (ICD-300.00)   LYMPHOMA, HX OF (ICD-V10.79)  - Dr. Myna Hidalgo  Family History:  The patient's mother died at age 60  secondary to diabetes. Her father died at age 66 due to complications of  pneumonia.  No obvious premature cardiovascular disease or familial cardiomyopathy is noted.  Daughter has sarcoid sister also may have sarcoid  Review of Systems  The patient denies chest pain and syncope.    Physical Exam  General:  alert, well-developed, and well-nourished.   Head:  normocephalic and atraumatic.   Neck:   supple without bruits or JVD. Lungs:  normal respiratory effort and normal breath sounds.   Heart:  normal rate, regular rhythm, and no gallop.   Extremities:  trace left pedal edema and trace right pedal edema.   Neurologic:  cranial nerves II-XII intact and gait normal.   Psych:  normally interactive and good eye contact.     Impression & Recommendations:  Problem # 1:  RAYNAUDS SYNDROME (ICD-443.0) Assessment Improved good response to nifedipine.  ANA neg.  RF positive.  No signs or symptoms of RA.  antiphospholipid panel negative  Problem # 2:  RHEUMATOID FACTOR, POSITIVE (ICD-790.99) RF elevated.  consider sarcoid.  check ACE level.  screen for hepatitis and also check RPR  Problem # 3:  HYPERTENSION (ICD-401.9) Assessment: Improved she may have component of white coat htn.  Maintain current medication regimen.  Her updated medication list for this problem includes:    Lisinopril 10 Mg Tabs (Lisinopril) .Marland Kitchen... 1 by mouth dialy    Bisoprolol Fumarate 5 Mg Tabs (Bisoprolol fumarate) ..... One by mouth once daily    Nifedipine Cr Osmotic 30 Mg Xr24h-tab (Nifedipine) ..... One by mouth once daily  BP today: 140/60 Prior BP: 170/80 (01/09/2010)  Labs Reviewed: K+: 4.1 (01/09/2010) Creat: : 0.81 (01/09/2010)  Chol: 182 (01/09/2010)   HDL: 62 (01/09/2010)   LDL: 106 (01/09/2010)   TG: 72 (01/09/2010)  Problem # 4:  HYPOTHYROIDISM (ICD-244.9) start thyroid replacement  Her updated medication list for this problem includes:    Levothyroxine Sodium 25 Mcg Tabs (Levothyroxine sodium) ..... One by mouth at bedtime  Labs Reviewed: TSH: 4.582 (01/09/2010)    Chol: 182 (01/09/2010)   HDL: 62 (01/09/2010)   LDL: 106 (01/09/2010)   TG: 72 (01/09/2010)  Her updated medication list for this problem includes:    Levothyroxine Sodium 25 Mcg Tabs (Levothyroxine sodium) ..... One by mouth at bedtime  Complete Medication List: 1)  Digoxin 0.125 Mg Tabs (Digoxin) .... 1/2 tablet  by mouth  once daily 2)  Lisinopril 10 Mg Tabs (Lisinopril) .Marland Kitchen.. 1 by mouth dialy 3)  Centrum Silver Tabs (Multiple vitamins-minerals) .... Take 1 tablet by mouth once a day 4)  Bisoprolol Fumarate 5 Mg Tabs (Bisoprolol fumarate) .... One by mouth once daily 5)  Nifedipine Cr Osmotic 30 Mg Xr24h-tab (Nifedipine) .... One by mouth once daily 6)  Levothyroxine Sodium 25 Mcg Tabs (Levothyroxine sodium) .... One by mouth at bedtime  Patient Instructions: 1)  Please schedule a follow-up appointment in 2 months. 2)  TSH prior to visit, ICD-9: 244.9 3)  ACE level:  785.6 4)  LFT's:  790.4 5)  Please return for lab work one (1) week before your next appointment.  Prescriptions: LEVOTHYROXINE SODIUM 25 MCG TABS (LEVOTHYROXINE SODIUM) one by mouth at bedtime  #30 x 2   Entered and Authorized by:   D. Thomos Lemons DO   Signed by:   D. Thomos Lemons DO on 01/24/2010   Method used:   Electronically to        [id number] (retail)       [address]       [place], [place]  [address]       Ph: [phone] or [phone]       Fax: [phone]   RxID:   [id number]   Current Allergies (reviewed today): No known allergies     Preventive Care Screening  Pap Smear:    Date:  01/24/2010    Results:  No Longer Done

## 2010-12-16 NOTE — Letter (Signed)
[facility]   [facility]   Imported By: Roderic Ovens 08/01/2010 10:20:01  _____________________________________________________________________  External Attachment:    Type:   Image     Comment:   External Document

## 2010-12-16 NOTE — Assessment & Plan Note (Signed)
Summary: 6 month follow up/mhf   Vital Signs:  Patient profile:   77 year old female Height:      62.25 inches Weight:      133.50 pounds BMI:     24.31 Temp:     97.8 degrees F oral Pulse rate:   60 / minute Resp:     16 per minute BP sitting:   142 / 70  (right arm) Cuff size:   regular  Vitals Entered By: Glendell Docker CMA (October 02, 2010 9:53 AM) CC: 6 Month follow up  Is Patient Diabetic? No Pain Assessment Patient in pain? no      Comments c/o headaches and skin irritation, she states this started after taking the  Levothyroxine- but she is not sure. She would like to discuss platelet count, c/o bilateral ringing in her ears -constant and she states that it is getting louder   Primary Care Ashe Graybeal:  D. Thomos Lemons DO  CC:  6 Month follow up .  History of Present Illness: 77 y/o AA female c/o intermittent headaches she wakes up with headaches not severe goes away with activity feels like stress headache  she also has chronic left sided ringing in the ear no hearing loss prev eval by ENT she is not interested in "white noise device"  Preventive Screening-Counseling & Management  Alcohol-Tobacco     Smoking Status: never  Allergies (verified): No Known Drug Allergies  Past History:  Past Medical History: CARDIOMYOPATHY (ICD-425.4)-  Nonischemic      a.   NL cors on cath 08/2009, EF 15-20%  Her f/u 2D echo in  10/2009    showed improved EF to 35-40 %. CONGESTIVE HEART FAILURE (ICD-428.0) HYPERTENSION (ICD-401.9)  ANXIETY (ICD-300.00)    LYMPHOMA, HX OF (ICD-V10.79)  - Dr. Myna Hidalgo  Family History:  The patient's mother died at age 2  secondary to diabetes. Her father died at age 35 due to complications of  pneumonia.  No obvious premature cardiovascular disease or familial cardiomyopathy is noted.  Daughter has sarcoid sister also may have sarcoid    Physical Exam  General:  alert, well-developed, and well-nourished.   Lungs:  normal  respiratory effort and normal breath sounds.   Heart:  normal rate, regular rhythm, no murmur, and no gallop.   Extremities:  trace left pedal edema and trace right pedal edema.     Impression & Recommendations:  Problem # 1:  HYPOTHYROIDISM (ICD-244.9) Assessment Improved  Her updated medication list for this problem includes:    Levothyroxine Sodium 25 Mcg Tabs (Levothyroxine sodium) ..... One by mouth at bedtime  Problem # 2:  HYPERTENSION (ICD-401.9) Assessment: Unchanged pt did not take her meds this AM Her updated medication list for this problem includes:    Lisinopril 10 Mg Tabs (Lisinopril) .Marland Kitchen... 1 by mouth dialy    Bisoprolol Fumarate 5 Mg Tabs (Bisoprolol fumarate) ..... One by mouth once daily    Nifedipine Cr Osmotic 30 Mg Xr24h-tab (Nifedipine) ..... One by mouth once daily  BP today: 142/70 Prior BP: 140/80 (03/27/2010)  Labs Reviewed: K+: 4.8 (09/25/2010) Creat: : 1.04 (09/25/2010)   Chol: 175 (02/26/2010)   HDL: 52 (02/26/2010)   LDL: 105 (02/26/2010)   TG: 92 (02/26/2010)  Problem # 3:  CARDIOMYOPATHY (ICD-425.4)  Orders: Echo Referral (Echo)  Problem # 4:  TINNITUS, LEFT (ICD-388.30) she reports prev eval by ENT.  they suggested white noise device in the past she has high freq hearing loss she defers ENT referral  Complete Medication List: 1)  Digoxin 0.125 Mg Tabs (Digoxin) .... 1/2 tablet  by mouth once daily 2)  Lisinopril 10 Mg Tabs (Lisinopril) .Marland Kitchen.. 1 by mouth dialy 3)  Centrum Silver Tabs (Multiple vitamins-minerals) .... Take 1 tablet by mouth once a day 4)  Bisoprolol Fumarate 5 Mg Tabs (Bisoprolol fumarate) .... One by mouth once daily 5)  Nifedipine Cr Osmotic 30 Mg Xr24h-tab (Nifedipine) .... One by mouth once daily 6)  Levothyroxine Sodium 25 Mcg Tabs (Levothyroxine sodium) .... One by mouth at bedtime  Patient Instructions: 1)  Please schedule a follow-up appointment in 6 months. 2)  BMP prior to visit, ICD-9:  401.9 3)  TSH prior to  visit, ICD-9: 272.4 4)  CBC:  287.5 5)  Please return for lab work one (1) week before your next appointment.  Prescriptions: DIGOXIN 0.125 MG TABS (DIGOXIN) 1/2 tablet  by mouth once daily  #90 x 1   Entered and Authorized by:   D. Thomos Lemons DO   Signed by:   D. Thomos Lemons DO on 10/02/2010   Method used:   Electronically to        [id number] (retail)       [address]       [place], [place]  [address]       Ph: [phone] or [phone]       Fax: [phone]   RxID:   [id number] LEVOTHYROXINE SODIUM 25 MCG TABS (LEVOTHYROXINE SODIUM) one by mouth at bedtime  #90 x 1   Entered and Authorized by:   D. Thomos Lemons DO   Signed by:   D. Thomos Lemons DO on 10/02/2010   Method used:   Electronically to        [id number] (retail)       [address]       [place], [place]  [address]       Ph: [phone] or [phone]       Fax: [phone]   RxID:   [id number] NIFEDIPINE CR OSMOTIC 30 MG XR24H-TAB (NIFEDIPINE) one by mouth once daily  #90 x 1   Entered and Authorized by:   D. Thomos Lemons DO   Signed by:   D. Thomos Lemons DO on 10/02/2010   Method used:   Electronically to        [id number] (retail)       [address]       [place], [place]  [address]       Ph: [phone] or [phone]       Fax: [phone]   RxID:   [id number] BISOPROLOL FUMARATE 5 MG TABS (BISOPROLOL FUMARATE) one by mouth once daily  #90 x 1   Entered and Authorized by:   D. Thomos Lemons DO   Signed by:   D. Thomos Lemons DO on 10/02/2010   Method used:   Electronically to        [id number] (retail)       [address]       [place], [place]  [address]       Ph: [phone] or [phone]       Fax: [phone]   RxID:   [id number] LISINOPRIL  10 MG TABS (LISINOPRIL) 1 by mouth dialy  #90 x 1   Entered and Authorized by:   D.  Thomos Lemons DO   Signed by:   D. Thomos Lemons DO on 10/02/2010   Method used:   Electronically to        [id number] (retail)       [address]       [place], [place]  [address]       Ph: [phone] or [phone]       Fax: [phone]   RxID:   [id number]    Orders Added: 1)  Echo Referral [Echo] 2)  Est. Patient Level III [[address]]   Immunization History:  Influenza Immunization History:    Influenza:  declined (10/02/2010)   Contraindications/Deferment of Procedures/Staging:    Test/Procedure: FLU VAX    Reason for deferment: patient declined   Immunization History:  Influenza Immunization History:    Influenza:  Declined (10/02/2010)  Current Allergies (reviewed today): No known allergies    Preventive Care Screening  Mammogram:    Date:  10/02/2010    Results:  appt  12/1

## 2010-12-16 NOTE — Assessment & Plan Note (Signed)
Summary: 2 month follow up/mhf   Vital Signs:  Patient profile:   77 year old female Height:      62.25 inches Weight:      132.25 pounds BMI:     24.08 O2 Sat:      100 % on Room air Temp:     97.8 degrees F oral Pulse rate:   54 / minute Pulse rhythm:   regular Resp:     16 per minute BP sitting:   140 / 80  (left arm) Cuff size:   regular  Vitals Entered By: Glendell Docker CMA (Mar 27, 2010 9:00 AM)  O2 Flow:  Room air CC: Rm 2- 2 Month Follow up disease management  Does patient need assistance? Ambulation Normal Comments discuss lab results and continuing thyroid  medication    Primary Care Provider:  D. Thomos Lemons DO  CC:  Rm 2- 2 Month Follow up disease management.  History of Present Illness: 77 y/o AA female with hx of lymphoma, non ischemic cardiomyopathy, and raynaud's for follow up  she is feeling much better.  no flare of raynauds.  she completed senior games we reviewed recent blood work thyroid studies better since starting thyroid replacement  Allergies (verified): No Known Drug Allergies  Past History:  Past Medical History: CARDIOMYOPATHY (ICD-425.4)-  Nonischemic      a.   NL cors on cath 08/2009, EF 15-20%  Her f/u 2D echo in  10/2009    showed improved EF to 35-40 %. CONGESTIVE HEART FAILURE (ICD-428.0) HYPERTENSION (ICD-401.9) ANXIETY (ICD-300.00)    LYMPHOMA, HX OF (ICD-V10.79)  - Dr. Myna Hidalgo  Past Surgical History: Tonsillectomy as a child      Family History:  The patient's mother died at age 58  secondary to diabetes. Her father died at age 48 due to complications of  pneumonia.  No obvious premature cardiovascular disease or familial cardiomyopathy is noted.  Daughter has sarcoid sister also may have sarcoid   Social History:  The patient is married ( husband Gerda Yin)  She just recently celebrated   her 52nd anniversary.  She has five children.  There is no active   tobacco or alcohol use history.        Physical  Exam  General:  alert, well-developed, and well-nourished.   Eyes:  pupils equal, pupils round, and pupils reactive to light.   Lungs:  normal respiratory effort and normal breath sounds.   Heart:  normal rate, regular rhythm, no murmur, and no gallop.   Extremities:  trace left pedal edema and trace right pedal edema.     Impression & Recommendations:  Problem # 1:  HYPOTHYROIDISM (ICD-244.9) Assessment Improved continue same dose.   monitor TSH  Her updated medication list for this problem includes:    Levothyroxine Sodium 25 Mcg Tabs (Levothyroxine sodium) ..... One by mouth at bedtime  Future Orders: T-TSH ([id number]) ... 09/27/2010 T- * Misc. Laboratory test [id number]) ... 09/27/2010  Problem # 2:  HYPERTENSION (ICD-401.9) stable.  Maintain current medication regimen.  Her updated medication list for this problem includes:    Lisinopril 10 Mg Tabs (Lisinopril) .Marland Kitchen... 1 by mouth dialy    Bisoprolol Fumarate 5 Mg Tabs (Bisoprolol fumarate) ..... One by mouth once daily    Nifedipine Cr Osmotic 30 Mg Xr24h-tab (Nifedipine) ..... One by mouth once daily  Future Orders: T-Basic Metabolic Panel [id number]) ... 09/27/2010  BP today: 140/80 Prior BP: 140/60 (01/24/2010)  Labs Reviewed: K+: 4.1 (01/09/2010) Creat: :  0.81 (01/09/2010)   Chol: 175 (02/26/2010)   HDL: 52 (02/26/2010)   LDL: 105 (02/26/2010)   TG: 92 (02/26/2010)  Complete Medication List: 1)  Digoxin 0.125 Mg Tabs (Digoxin) .... 1/2 tablet  by mouth once daily 2)  Lisinopril 10 Mg Tabs (Lisinopril) .Marland Kitchen.. 1 by mouth dialy 3)  Centrum Silver Tabs (Multiple vitamins-minerals) .... Take 1 tablet by mouth once a day 4)  Bisoprolol Fumarate 5 Mg Tabs (Bisoprolol fumarate) .... One by mouth once daily 5)  Nifedipine Cr Osmotic 30 Mg Xr24h-tab (Nifedipine) .... One by mouth once daily 6)  Levothyroxine Sodium 25 Mcg Tabs (Levothyroxine sodium) .... One by mouth at bedtime  Patient Instructions: 1)  Please schedule a  follow-up appointment in 6 months. 2)  BMP prior to visit, ICD-9:  401.9 3)  TSH prior to visit, ICD-9:  244.9 4)  Thyroid antibodies:  244.9 5)  Please return for lab work one (1) week before your next appointment.  Prescriptions: LEVOTHYROXINE SODIUM 25 MCG TABS (LEVOTHYROXINE SODIUM) one by mouth at bedtime  #30 x 2   Entered and Authorized by:   D. Thomos Lemons DO   Signed by:   D. Thomos Lemons DO on 03/27/2010   Method used:   Electronically to        [id number] (retail)       [address]       [place], [place]  [address]       Ph: [phone] or [phone]       Fax: [phone]   RxID:   [id number] NIFEDIPINE CR OSMOTIC 30 MG XR24H-TAB (NIFEDIPINE) one by mouth once daily  #30 x 5   Entered and Authorized by:   D. Thomos Lemons DO   Signed by:   D. Thomos Lemons DO on 03/27/2010   Method used:   Electronically to        [id number]* (retail)       [address]       [place], [place]  [address]       Ph: [phone] or [phone]       Fax: [phone]   RxID:   [id number] BISOPROLOL FUMARATE 5 MG TABS (BISOPROLOL FUMARATE) one by mouth once daily  #30 Tablet x 5   Entered and Authorized by:   D. Thomos Lemons DO   Signed by:   D. Thomos Lemons DO on 03/27/2010   Method used:   Electronically to        [id number]* (retail)       [address]       [place], [place]  [address]       Ph: [phone] or [phone]       Fax: [phone]   RxID:   [id number] DIGOXIN 0.125 MG TABS (DIGOXIN) 1/2 tablet  by mouth once daily  #30 x 5   Entered and Authorized by:   D. Thomos Lemons DO   Signed by:   D. Thomos Lemons DO on 03/27/2010   Method used:   Electronically to        [id number] (retail)       [address]       [place], [place]  [address]       Ph: [phone] or [phone]       Fax: [phone]   RxID:    [id number] LISINOPRIL 10 MG TABS (LISINOPRIL) 1 by mouth dialy  #30 x 5   Entered and Authorized by:   D. Thomos Lemons DO   Signed by:   D. Thomos Lemons DO on 03/27/2010   Method used:   Electronically to        [id number] (retail)       [address]       [place], [place]  [address]       Ph: [phone] or [phone]       Fax: [phone]   RxID:   [id number]   Current Allergies (reviewed today): No known allergies

## 2010-12-16 NOTE — Letter (Signed)
Summary: [facility]   [facility]   Imported By: Roderic Ovens 11/27/2009 15:19:24  _____________________________________________________________________  External Attachment:    Type:   Image     Comment:   External Document  Appended Document: [facility]  start norvasc 2.5. bring back to see me in next few weeks.  Appended Document: [facility]  pt refused to add new med at this time, she states she is feeling better w/the decreased dose of carvedilol and she is on 4 meds and is not going to add anymore at this time, explained meds to pt she still insist she is not adding anymore at this time and follow up w/Dr Aracelys Glade at a later time.

## 2010-12-16 NOTE — Letter (Signed)
Summary: [facility]  [facility]   Imported By: Kassie Mends 04/03/2010 09:49:00  _____________________________________________________________________  External Attachment:    Type:   Image     Comment:   External Document

## 2010-12-16 NOTE — Miscellaneous (Signed)
Summary: Appointment Canceled  Appointment status changed to canceled by [employer] on 10/14/2010 1:21 PM.  Cancellation Comments --------------------- ECHO. DX: CARDIOMYOPATHY. PT HAS MEDICARE. OFFICE PRECRET. GD  Appointment Information ----------------------- Appt Type:  CARDIOLOGY ANCILLARY VISIT      Date:  Monday, October 20, 2010      Time:  11:30 AM for 60 min   Urgency:  Routine   Made By:  Pearson Grippe  To Visit:  [id number]    Reason:  ECHO. DX: CARDIOMYOPATHY. PT HAS MEDICARE. OFFICE PRECRET. GD  Appt Comments ------------- -- 10/14/10 13:21: ([facility]) CANCELED -- ECHO. DX: CARDIOMYOPATHY. PT HAS MEDICARE. OFFICE PRECRET. GD -- 10/06/10 15:38: ([facility]) BOOKED -- Routine CARDIOLOGY ANCILLARY VISIT at 10/20/2010 11:30 AM for 60 min ECHO. DX: CARDIOMYOPATHY. PT HAS MEDICARE. OFF

## 2010-12-16 NOTE — Progress Notes (Signed)
Summary: med question about Drug interactions  Phone Note Call from Patient   Caller: Patient Summary of Call: Pt. is here in and is wondering if the meds.  Nifedical and Digoxin have a drug interaction problem with each other.... Pt. needs to know before she takes  the meds. Call [phone]  as soon as possible. Thank you Initial call taken by: Michaelle Copas,  January 09, 2010 3:13 PM  Follow-up for Phone Call        take 1/2 dose of digoxin while on nifedipine Follow-up by: D. Thomos Lemons DO,  January 09, 2010 4:13 PM  Additional Follow-up for Phone Call Additional follow up Details #1::        informed pt. to take 1/2 dose of Digoxin while on nifedipine Additional Follow-up by: Michaelle Copas,  January 09, 2010 4:31 PM

## 2010-12-16 NOTE — Letter (Signed)
   [facility] [address] [place], [place]  [address]  [place] Phone: [phone]      March 13, 2010   Crystal [name] [address] [place], [place] [address]  RE:  LAB RESULTS  Dear  [name],  The following is an interpretation of your most recent lab tests.  Please take note of any instructions provided or changes to medications that have resulted from your lab work.  LIPID PANEL:  Good - no changes needed Triglyceride: 92   Cholesterol: 175   LDL: 105   HDL: 52   Chol/HDL%:  3.4 Ratio  THYROID STUDIES:  Thyroid studies normal TSH: 2.704     Hepatitis C antibody - negative  RPR - negative     Sincerely Yours,    Dr. Thomos Lemons

## 2010-12-18 NOTE — Letter (Signed)
Summary:  [facility]  [facility]   Imported By: Lester Barnard 10/29/2010 09:33:59  _____________________________________________________________________  External Attachment:    Type:   Image     Comment:   External Document

## 2011-02-19 LAB — BASIC METABOLIC PANEL
BUN: 18 mg/dL (ref 6–23)
CO2: 28 mEq/L (ref 19–32)
Calcium: 8.7 mg/dL (ref 8.4–10.5)
Calcium: 9.1 mg/dL (ref 8.4–10.5)
Chloride: 102 mEq/L (ref 96–112)
Chloride: 104 mEq/L (ref 96–112)
Creatinine, Ser: 0.81 mg/dL (ref 0.4–1.2)
GFR calc Af Amer: >60 mL/min (ref >60)
GFR calc non Af Amer: >60 mL/min (ref >60)
GFR calc non Af Amer: >60 mL/min (ref >60)
Glucose, Bld: 101 mg/dL — ABNORMAL HIGH (ref 70–99)
Glucose, Bld: 117 mg/dL — ABNORMAL HIGH (ref 70–99)
Potassium: 3.7 mEq/L (ref 3.5–5.1)
Potassium: 3.8 mEq/L (ref 3.5–5.1)
Potassium: 4 mEq/L (ref 3.5–5.1)
Sodium: 136 mEq/L (ref 135–145)
Sodium: 137 mEq/L (ref 135–145)

## 2011-02-19 LAB — COMPREHENSIVE METABOLIC PANEL
ALT: 51 U/L — ABNORMAL HIGH (ref 0–35)
ALT: 86 U/L — ABNORMAL HIGH (ref 0–35)
AST: 47 U/L — ABNORMAL HIGH (ref 0–37)
Albumin: 2.4 g/dL — ABNORMAL LOW (ref 3.5–5.2)
Alkaline Phosphatase: 120 U/L — ABNORMAL HIGH (ref 39–117)
Alkaline Phosphatase: 124 U/L — ABNORMAL HIGH (ref 39–117)
Alkaline Phosphatase: 168 U/L — ABNORMAL HIGH (ref 39–117)
BUN: 11 mg/dL (ref 6–23)
BUN: 15 mg/dL (ref 6–23)
CO2: 27 mEq/L (ref 19–32)
CO2: 28 mEq/L (ref 19–32)
CO2: 28 mEq/L (ref 19–32)
CO2: 31 mEq/L (ref 19–32)
Calcium: 8.4 mg/dL (ref 8.4–10.5)
Chloride: 104 mEq/L (ref 96–112)
Chloride: 104 mEq/L (ref 96–112)
Chloride: 106 mEq/L (ref 96–112)
Creatinine, Ser: 0.74 mg/dL (ref 0.4–1.2)
Creatinine, Ser: 0.75 mg/dL (ref 0.4–1.2)
Creatinine, Ser: 0.84 mg/dL (ref 0.4–1.2)
GFR calc Af Amer: >60 mL/min (ref >60)
GFR calc non Af Amer: >60 mL/min (ref >60)
GFR calc non Af Amer: >60 mL/min (ref >60)
GFR calc non Af Amer: >60 mL/min (ref >60)
GFR calc non Af Amer: >60 mL/min (ref >60)
Glucose, Bld: 106 mg/dL — ABNORMAL HIGH (ref 70–99)
Glucose, Bld: 107 mg/dL — ABNORMAL HIGH (ref 70–99)
Glucose, Bld: 109 mg/dL — ABNORMAL HIGH (ref 70–99)
Potassium: 3.8 mEq/L (ref 3.5–5.1)
Potassium: 4 mEq/L (ref 3.5–5.1)
Potassium: 4.6 mEq/L (ref 3.5–5.1)
Sodium: 135 mEq/L (ref 135–145)
Sodium: 140 mEq/L (ref 135–145)
Total Bilirubin: 0.5 mg/dL (ref 0.3–1.2)
Total Bilirubin: 0.8 mg/dL (ref 0.3–1.2)
Total Bilirubin: 0.9 mg/dL (ref 0.3–1.2)
Total Protein: 6.3 g/dL (ref 6.0–8.3)

## 2011-02-19 LAB — LIPID PANEL: Cholesterol: 131 mg/dL (ref 0–200)

## 2011-02-19 LAB — CBC
HCT: 36.2 % (ref 36.0–46.0)
HCT: 37.2 % (ref 36.0–46.0)
HCT: 40.3 % (ref 36.0–46.0)
Hemoglobin: 11.9 g/dL — ABNORMAL LOW (ref 12.0–15.0)
Hemoglobin: 12.3 g/dL (ref 12.0–15.0)
MCV: 86.4 fL (ref 78.0–100.0)
MCV: 87.5 fL (ref 78.0–100.0)
MCV: 87.7 fL (ref 78.0–100.0)
MCV: 87.7 fL (ref 78.0–100.0)
Platelets: 158 10*3/uL (ref 150–400)
Platelets: 186 10*3/uL (ref 150–400)
Platelets: 197 10*3/uL (ref 150–400)
RBC: 4.61 MIL/uL (ref 3.87–5.11)
RDW: 12.6 % (ref 11.5–15.5)
WBC: 3.9 10*3/uL — ABNORMAL LOW (ref 4.0–10.5)
WBC: 4.6 10*3/uL (ref 4.0–10.5)
WBC: 4.7 10*3/uL (ref 4.0–10.5)
WBC: 7 10*3/uL (ref 4.0–10.5)

## 2011-02-19 LAB — MAGNESIUM: Magnesium: 1.9 mg/dL (ref 1.5–2.5)

## 2011-02-19 LAB — CARDIAC PANEL(CRET KIN+CKTOT+MB+TROPI)
CK, MB: 2.1 ng/mL (ref 0.3–4.0)
Relative Index: INVALID (ref 0.0–2.5)
Troponin I: 0.04 ng/mL (ref 0.00–0.06)
Troponin I: 0.05 ng/mL (ref 0.00–0.06)

## 2011-02-19 LAB — DIFFERENTIAL
Basophils Relative: 3 % — ABNORMAL HIGH (ref 0–1)
Eosinophils Absolute: 0 10*3/uL (ref 0.0–0.7)
Neutrophils Relative %: 75 % (ref 43–77)

## 2011-02-19 LAB — HEPATITIS PANEL, ACUTE
HCV Ab: NEGATIVE
Hep A IgM: NEGATIVE
Hepatitis B Surface Ag: NEGATIVE

## 2011-02-19 LAB — POCT I-STAT 3, VENOUS BLOOD GAS (G3P V)
Acid-Base Excess: 2 mmol/L (ref 0.0–2.0)
Bicarbonate: 27.5 mEq/L — ABNORMAL HIGH (ref 20.0–24.0)
pO2, Ven: 34 mmHg (ref 30.0–45.0)

## 2011-02-19 LAB — BRAIN NATRIURETIC PEPTIDE: Pro B Natriuretic peptide (BNP): 640 pg/mL — ABNORMAL HIGH (ref 0.0–100.0)

## 2011-02-19 LAB — POCT I-STAT 3, ART BLOOD GAS (G3+)
Bicarbonate: 28.4 mEq/L — ABNORMAL HIGH (ref 20.0–24.0)
pCO2 arterial: 42.4 mmHg (ref 35.0–45.0)
pH, Arterial: 7.434 — ABNORMAL HIGH (ref 7.350–7.400)

## 2011-02-19 LAB — APTT: aPTT: 31 seconds (ref 24–37)

## 2011-02-19 LAB — T4, FREE: Free T4: 0.92 ng/dL (ref 0.80–1.80)

## 2011-02-19 LAB — D-DIMER, QUANTITATIVE: D-Dimer, Quant: 5.26 ug/mL-FEU — ABNORMAL HIGH (ref 0.00–0.48)

## 2011-02-19 LAB — TSH: TSH: 2.906 u[IU]/mL (ref 0.350–4.500)

## 2011-02-19 LAB — PROTIME-INR: INR: 1.08 (ref 0.00–1.49)

## 2011-02-19 LAB — CK TOTAL AND CKMB (NOT AT ARMC)
CK, MB: 4.1 ng/mL — ABNORMAL HIGH (ref 0.3–4.0)
Total CK: 125 U/L (ref 7–177)

## 2011-02-19 LAB — LACTATE DEHYDROGENASE: LDH: 225 U/L (ref 94–250)

## 2011-03-26 ENCOUNTER — Other Ambulatory Visit: Payer: Self-pay | Admitting: Internal Medicine

## 2011-03-26 DIAGNOSIS — I10 Essential (primary) hypertension: Secondary | ICD-10-CM

## 2011-03-26 DIAGNOSIS — E785 Hyperlipidemia, unspecified: Secondary | ICD-10-CM

## 2011-03-26 DIAGNOSIS — D696 Thrombocytopenia, unspecified: Secondary | ICD-10-CM

## 2011-03-26 LAB — CBC WITH DIFFERENTIAL/PLATELET
Basophils Absolute: 0 K/uL (ref 0.0–0.1)
Basophils Relative: 1 % (ref 0–1)
Eosinophils Absolute: 0 K/uL (ref 0.0–0.7)
Eosinophils Relative: 1 % (ref 0–5)
HCT: 40.2 % (ref 36.0–46.0)
Hemoglobin: 13.1 g/dL (ref 12.0–15.0)
Lymphocytes Relative: 40 % (ref 12–46)
Lymphs Abs: 1.4 K/uL (ref 0.7–4.0)
MCH: 28.5 pg (ref 26.0–34.0)
MCHC: 32.6 g/dL (ref 30.0–36.0)
MCV: 87.4 fL (ref 78.0–100.0)
Monocytes Absolute: 0.3 K/uL (ref 0.1–1.0)
Monocytes Relative: 8 % (ref 3–12)
Neutro Abs: 1.7 K/uL (ref 1.7–7.7)
Neutrophils Relative %: 50 % (ref 43–77)
Platelets: 182 K/uL (ref 150–400)
RBC: 4.6 MIL/uL (ref 3.87–5.11)
RDW: 12.4 % (ref 11.5–15.5)
WBC: 3.5 K/uL — ABNORMAL LOW (ref 4.0–10.5)

## 2011-03-26 NOTE — Progress Notes (Signed)
Orders entered and faxed to the lab.  

## 2011-03-27 LAB — BASIC METABOLIC PANEL WITH GFR
BUN: 15 mg/dL (ref 6–23)
Calcium: 10.2 mg/dL (ref 8.4–10.5)
GFR, Est African American: >60 mL/min (ref >60)
Glucose, Bld: 98 mg/dL (ref 70–99)
Sodium: 139 mEq/L (ref 135–145)

## 2011-03-31 ENCOUNTER — Encounter: Payer: Self-pay | Admitting: Internal Medicine

## 2011-03-31 NOTE — Assessment & Plan Note (Signed)
OFFICE VISIT   Crystal, [name]  DOB:  08-03-1934                                       11/18/2009  ZOXWR#:[id number]   Patient returns today for further evaluation of her bluish discoloration  of the toes of both feet, which occurs intermittently.  Dr. Teena Dunk had  referred her to the emergency department for me to evaluate on December  22nd, and at that time, it appeared that her circulation was intact, and  it was not felt that this represented a peripheral emboli.  She is being  seen in followup today.  She is able to ambulate long distances greater  than 1 mile with no buttock, thigh, or calf claudication.  She has no  history of ischemic ulcers, gangrene, or infection of the feet.  She  does state that intermittently she will develop bluish discoloration of  the toes of both feet, but in general, this does not involve the same  toe on each occasion, and this will cause some mild discomfort but when  the feet are warmed, this is resolved.   CURRENT CHRONIC STABLE MEDICAL PROBLEMS:  1. Cardiomyopathy.  2. Congestive heart failure.  3. Hypertension.  4. History of lymphoma, previously treated by Dr. Myna Hidalgo.   SOCIAL HISTORY:  The patient is married for 52 years, has 5 children.  Does not use tobacco or alcohol.   REVIEW OF SYSTEMS:  The patient denies any chest pain, dyspnea on  exertion, PND, orthopnea.  No cardiac arrhythmias.  Denies any abdominal  symptoms.  No joint symptoms.   PHYSICAL EXAMINATION:  Blood pressure 153/80.  Heart rate is 85.  Respirations 16.  Generally, she is alert, oriented x3.  She is a well-  developed, well-nourished female in no apparent distress.  Neck is  supple, 3+ carotid pulses are palpable.  No bruits are audible.  Neurologic exam is normal.  Chest:  Clear to auscultation.  Cardiovascular:  Regular rhythm, no murmurs.  Abdomen is soft,  nontender, with no masses.  Extremity exam reveals 3+ femoral, 3+  popliteal,  3+ right dorsalis pedis, and 2-3+ left posterior tibial pulse  palpable.  Both feet are well-perfused.  There is some mild bluish  discoloration of the third toe of the right foot and the fourth toe of  the left foot.  She states that this occurs when the room is cold.  She  has good capillary refill.   I ordered lower extremity arterial Dopplers today as well as a scan of  her aortoiliac system, which I have reviewed and interpreted.  She does  not have any evidence of aortic aneurysm or aortoiliac occlusive disease  as a source for emboli.  Her lower extremity arterial system is widely  patent with normal ABIs bilaterally, greater than 1.0.  She does have  slight diminished digital flow of the first toes of both feet.   I think she has a vasospastic phenomenon, possibly affected by her  medications, which has been decreased (carvedilol 6.25 mg 1/2 tablet  b.i.d.).  I would recommend continuing her aspirin as I prescribed.  Does not need any further arterial vascular workup.  I do not think that  this phenomenon represents peripheral emboli.     Quita Skye Hart Rochester, M.D.  Electronically Signed   JDL/MEDQ  D:  11/18/2009  T:  11/19/2009  Job:  [id number]   cc:   Bevelyn Buckles. Bensimhon, MD

## 2011-03-31 NOTE — Procedures (Signed)
DUPLEX ULTRASOUND OF ABDOMINAL AORTA   INDICATION:  Possible lower extremity digit emboli.   HISTORY:  Diabetes:  no  Cardiac:  CHF  Hypertension:  no  Smoking:  no  Family History:  Previous Surgery:  No   DUPLEX EXAM:         AP (cm)                   TRANSVERSE (cm)  Proximal             1.57 cm                   1.64 cm  Mid                  1.59 cm                   1.56 cm  Distal               1.56 cm                   1.63 cm  Right Iliac          1.02 cm                   1.08 cm  Left Iliac           1.01 cm                   1.08 cm   PREVIOUS:  Date:  AP:  TRANSVERSE:   IMPRESSION:  No evidence of aortic or iliac aneurysms.  \  Note:  Some areas of iliac arteries not visualized well due to bowel gas  and acoustic shadowing.   ___________________________________________  Quita Skye. Hart Rochester, M.D.   AS/MEDQ  D:  11/18/2009  T:  11/19/2009  Job:  [id number]

## 2011-04-02 ENCOUNTER — Ambulatory Visit (INDEPENDENT_AMBULATORY_CARE_PROVIDER_SITE_OTHER): Payer: Medicare Other | Admitting: Internal Medicine

## 2011-04-02 ENCOUNTER — Encounter: Payer: Self-pay | Admitting: Internal Medicine

## 2011-04-02 DIAGNOSIS — I428 Other cardiomyopathies: Secondary | ICD-10-CM

## 2011-04-02 DIAGNOSIS — R002 Palpitations: Secondary | ICD-10-CM

## 2011-04-02 MED ORDER — LISINOPRIL 10 MG PO TABS: 10.0000 mg | ORAL_TABLET | Freq: Every day | ORAL | Status: DC

## 2011-04-02 MED ORDER — NIFEDIPINE ER 30 MG PO TB24: 30.0000 mg | ORAL_TABLET | Freq: Every day | ORAL | Status: DC

## 2011-04-02 MED ORDER — BISOPROLOL FUMARATE 5 MG PO TABS: 5.0000 mg | ORAL_TABLET | Freq: Every day | ORAL | Status: DC

## 2011-04-02 MED ORDER — LEVOTHYROXINE SODIUM 25 MCG PO TABS: 25.0000 ug | ORAL_TABLET | Freq: Every day | ORAL | Status: DC

## 2011-04-02 NOTE — Assessment & Plan Note (Deleted)
2 D Echo shows global hypokinesis.  EF 35 - 40 %.  Moderate MR Continue ACE and b blockers She senses intermittent palpitations  Arrange cardiology follow up with Dr. Jens Som

## 2011-04-02 NOTE — Assessment & Plan Note (Signed)
2 D Echo shows global hypokinesis.  EF 35 - 40 %.  Moderate MR Continue ACE and b blockers She senses intermittent palpitations  Arrange cardiology follow up with Dr. Crenshaw 

## 2011-04-03 NOTE — Op Note (Signed)
[facility]  Patient:    Crystal [name], Crystal [name]                       MRN: [id number] Proc. Date: 08/25/00 Adm. Date:  [id number] Attending:  Fernande Boyden CC:         Gloris Manchester. Lazarus Salines, M.D.  Dolan Amen, M.D.  Gabriel Earing, M.D.   Operative Report  PREOPERATIVE DIAGNOSIS:  Bulky left deep jugular adenopathy, rule out lymphoma.  POSTOPERATIVE DIAGNOSIS:  Bulky left deep jugular adenopathy, rule out lymphoma.  OPERATION PERFORMED:  Excisional biopsy, left deep jugular lymph node.  SURGEON:  Gloris Manchester. Lazarus Salines, M.D.  ANESTHESIA:  General orotracheal.  ESTIMATED BLOOD LOSS:  Minimal.  COMPLICATIONS:  None.  FINDINGS:  A yellowish rubbery, roughly 3 x 3 x 4  cm ovoid apparent lymph node posterior to the jugular vein at level 3.  Several additional lymph nodes in levels 2 and 4 along the jugular vein.  Frozen section consistent with lymphoma.  DESCRIPTION OF PROCEDURE:  With the patient in a comfortable supine position, general orotracheal anesthesia was induced without difficulty.  At an appropriate level, the table was turned slightly, the patient placed in reverse Trendelenburg and a shoulder roll placed.  The neck was extended and the head rotated towards the right for access to the left neck.  The neck was palpated with the findings as described above.  A suitable skin wrinkle was identified and infiltrated with 6 cc of 1% Xylocaine with 1:100,000 epinephrine. A sterile preparation and draping of the left neck was accomplished.  A 6 cm incision was made in a pre-existing skin wrinkle and carried down through skin, subcutaneous fat and platysma muscle.  The subplatysmal planes were elevated  superiorly and inferiorly.  The external jugular vein was identified and ligated.  The fascia anterior to the sternocleidomastoid was divided and carried down on the medial surface of the muscle.  The jugular vein was identified, dissected  and carried forward.  A very large node was identified and working on the capsule of the node, dissection was carried around gradually and the whole mass was delivered intact.  Spinal accessory nerve was noted to be mildly adherent to the posterior surface, was identified and was dissected clean without trauma to the nerve.  Small additional amount of bleeding in the wound was controlled with silk ligatures under direct vision.  The wound was irrigated and hemostasis was observed.  The specimen was sent off for lymphoma interpretation.  Frozen section returned as consistent with lymphoma with final diagnosis pending further testing.  At this point the patient had Valsalva and no further bleeding was noted.  The fascia of the sternocleidomastoid was reclosed with 4-0 chromic suture.  The platysmal layer was reclosed with 4-0 chromic and finally the skin was closed with a running subcuticular 5-0 Ethilon.  Benzoin and Steri-Strips were applied for final closure.  A fluff and Ace wrap pressure dressing was applied.  No drains had been used.  Hemostasis was observed prior to closing the wound.  The patient tolerated the procedure nicely.  At this point the patient was returned to anesthesia, awakened, extubated, and transferred to recovery in stable condition.  COMMENT:  A 77 year old black female with a four-month history of progressive left neck adenopathy with needle aspiration suspicious for lymphoma was the indication for todays procedure.  Anticipate routine postoperative  recovery with attention to ice, elevation and pressure wrap x 48 hours.  Will anticipate suture removal in one week.  The specimen is undergoing evaluation in [facility].  Dr. Letha Cape has been contacted to assist in the management of the probable lymphoma.  Given low anticipated risk of postanesthetic or postsurgical complications, I feel an outpatient venue is appropriate. DD:  08/25/00 TD:  08/26/00 Job:  [id number]

## 2011-04-12 ENCOUNTER — Other Ambulatory Visit: Payer: Self-pay | Admitting: Internal Medicine

## 2011-04-14 ENCOUNTER — Other Ambulatory Visit: Payer: Self-pay | Admitting: Internal Medicine

## 2011-04-14 NOTE — Telephone Encounter (Signed)
Rx refill sent to pharmacy on 04/02/2011

## 2011-04-14 NOTE — Telephone Encounter (Signed)
Rx refill resubmitted to pharmacy, original date 04/02/2011

## 2011-04-21 ENCOUNTER — Other Ambulatory Visit: Payer: Self-pay | Admitting: Internal Medicine

## 2011-04-22 NOTE — Telephone Encounter (Signed)
Call placed to [facility] [phone], spoke with [facility], she was informed a rx was sent in on 5/17 for a qty of 90 with no refills. She stated that rx has not been received. She was informed the electronic request would be submitted for refill.   Rx refill has been sent to pharmacy

## 2011-04-23 ENCOUNTER — Encounter (HOSPITAL_BASED_OUTPATIENT_CLINIC_OR_DEPARTMENT_OTHER): Payer: Medicare Other | Admitting: Hematology & Oncology

## 2011-04-23 ENCOUNTER — Other Ambulatory Visit: Payer: Self-pay | Admitting: Hematology & Oncology

## 2011-04-23 DIAGNOSIS — D696 Thrombocytopenia, unspecified: Secondary | ICD-10-CM

## 2011-04-23 DIAGNOSIS — C8589 Other specified types of non-Hodgkin lymphoma, extranodal and solid organ sites: Secondary | ICD-10-CM

## 2011-04-23 DIAGNOSIS — D72819 Decreased white blood cell count, unspecified: Secondary | ICD-10-CM

## 2011-04-23 LAB — CBC WITH DIFFERENTIAL (CANCER CENTER ONLY)
BASO#: 0 10*3/uL (ref 0.0–0.2)
Eosinophils Absolute: 0.1 10*3/uL (ref 0.0–0.5)
HCT: 38.3 % (ref 34.8–46.6)
LYMPH%: 41.6 % (ref 14.0–48.0)
MCV: 85 fL (ref 81–101)
MONO#: 0.4 10*3/uL (ref 0.1–0.9)
NEUT%: 43.7 % (ref 39.6–80.0)
RBC: 4.5 10*6/uL (ref 3.70–5.32)
WBC: 3.3 10*3/uL — ABNORMAL LOW (ref 3.9–10.0)

## 2011-04-23 LAB — COMPREHENSIVE METABOLIC PANEL
CO2: 28 mEq/L (ref 19–32)
Creatinine, Ser: 0.92 mg/dL (ref 0.50–1.10)
Glucose, Bld: 105 mg/dL — ABNORMAL HIGH (ref 70–99)
Total Bilirubin: 0.5 mg/dL (ref 0.3–1.2)
Total Protein: 8 g/dL (ref 6.0–8.3)

## 2011-04-23 LAB — LACTATE DEHYDROGENASE: LDH: 183 U/L (ref 94–250)

## 2011-05-06 ENCOUNTER — Encounter: Payer: Self-pay | Admitting: Cardiology

## 2011-05-06 ENCOUNTER — Ambulatory Visit (INDEPENDENT_AMBULATORY_CARE_PROVIDER_SITE_OTHER): Payer: Medicare Other | Admitting: Cardiology

## 2011-05-06 DIAGNOSIS — I428 Other cardiomyopathies: Secondary | ICD-10-CM

## 2011-05-06 DIAGNOSIS — I10 Essential (primary) hypertension: Secondary | ICD-10-CM

## 2011-05-06 NOTE — Progress Notes (Signed)
HPI: 77 year old female previously followed by Dr. Kittie Plater with nonischemic cardiomyopathy for evaluation of palpitations. Cardiac catheterization in 2010 showed an ejection fraction of 15-20% and normal coronary arteries. Last echocardiogram in December of 2011 revealed an ejection fraction of 35-40% and moderate mitral regurgitation. She was last seen in 2010. She denies dyspnea on exertion, orthopnea, PND, pedal edema, palpitations, syncope or exertional chest pain.  Current Outpatient Prescriptions  Medication Sig Dispense Refill  . bisoprolol (ZEBETA) 5 MG tablet Take 1 tablet (5 mg total) by mouth daily.  90 tablet  1  . levothyroxine (SYNTHROID, LEVOTHROID) 25 MCG tablet take 1 tablet by mouth at bedtime  90 tablet  0  . lisinopril (PRINIVIL,ZESTRIL) 10 MG tablet take 1 tablet by mouth once daily  90 tablet  1  . Multiple Vitamins-Minerals (CENTRUM PO) Take by mouth daily.        Marland Kitchen NIFEdipine (PROCARDIA-XL/ADALAT CC) 30 MG 24 hr tablet Take 1 tablet (30 mg total) by mouth daily.  90 tablet  1  . DISCONTD: digoxin (LANOXIN) 0.125 MG tablet Take 125 mcg by mouth. 1/2 tablet by mouth once a day          Past Medical History  Diagnosis Date  . Cardiomyopathy     non ischemic NL cos on cath 08/2009, EF to 15-20%, Her follow up  2D echo  in 10/2009 showed impoved EFo 35-40%  . CHF (congestive heart failure)   . Hypertension   . Anxiety   . History of lymphoma     Dr Drue Dun  . Hypothyroid     Past Surgical History  Procedure Date  . Tonsillectomy     as a child    History   Social History  . Marital Status: Married    Spouse Name: N/A    Number of Children: N/A  . Years of Education: N/A   Occupational History  . Not on file.   Social History Main Topics  . Smoking status: Never Smoker   . Smokeless tobacco: Not on file  . Alcohol Use: No  . Drug Use: Not on file  . Sexually Active: Not on file   Other Topics Concern  . Not on file   Social History Narrative   The patient is married ( husband Kashena Novitski)  She just recently celebrated  her 52nd anniversary.  She has five children.  There is no active  tobacco or alcohol use history.    Smoking Status:  neverCaffeine use/day:  NoneDoes Patient Exercise:  yes    ROS: no fevers or chills, productive cough, hemoptysis, dysphasia, odynophagia, melena, hematochezia, dysuria, hematuria, rash, seizure activity, orthopnea, PND, pedal edema, claudication. Remaining systems are negative.  Physical Exam: Well-developed well-nourished in no acute distress.  Skin is warm and dry.  HEENT is normal.  Neck is supple. No thyromegaly.  Chest is clear to auscultation with normal expansion.  Cardiovascular exam is regular rate and rhythm.  Abdominal exam nontender or distended. No masses palpated. Extremities show no edema. neuro grossly intact  ECG 04/02/11 Sinus Rhythm with Frequent PVCs and Nonspecific ST Changes.

## 2011-05-06 NOTE — Assessment & Plan Note (Signed)
Continue present blood pressure medications. Note she is on Procardia for history of peripheral vasospasm.

## 2011-05-06 NOTE — Assessment & Plan Note (Signed)
Patientis doing well from a symptomatic standpoint. Continue ACE inhibitor and beta blocker. She is not in heart failure. Discontinue digoxin. Plan followup echocardiograms in the future.

## 2011-05-06 NOTE — Patient Instructions (Signed)
Your physician wants you to follow-up in: ONE YEAR You will receive a reminder letter in the mail two months in advance. If you don't receive a letter, please call our office to schedule the follow-up appointment.   STOP DIGOXIN/LANOXIN

## 2011-05-17 ENCOUNTER — Other Ambulatory Visit: Payer: Self-pay | Admitting: Internal Medicine

## 2011-05-18 NOTE — Telephone Encounter (Signed)
Call placed to [facility] [phone], spoke with Judeth Cornfield, she stated medication was on  Automatic refill. Call placed to patient at  [phone], she was asked about rx refill request. She stated that she did not request refill on medication to pharmacy. She was advised the printed rx's  (which she has verified that she has) should be presented to pharmacy, when she is ready for medication refill. Patient has verbalized understanding and agrees

## 2011-06-01 ENCOUNTER — Encounter: Payer: Self-pay | Admitting: Gastroenterology

## 2011-06-17 NOTE — Progress Notes (Signed)
  Subjective:    Patient ID: Crystal [name], female    DOB: Nov 14, 1934, 77 y.o.   MRN: [id number]  HPI    Review of Systems     Objective:   Physical Exam        Assessment & Plan:

## 2011-07-15 ENCOUNTER — Encounter: Payer: Self-pay | Admitting: Gastroenterology

## 2011-07-27 ENCOUNTER — Ambulatory Visit (AMBULATORY_SURGERY_CENTER): Payer: Medicare Other | Admitting: *Deleted

## 2011-07-27 VITALS — Ht 61.0 in | Wt 138.4 lb

## 2011-07-27 DIAGNOSIS — Z1211 Encounter for screening for malignant neoplasm of colon: Secondary | ICD-10-CM

## 2011-07-27 MED ORDER — SUPREP BOWEL PREP KIT 17.5-3.13-1.6 GM/177ML PO SOLN: 1.0000 | Freq: Once | ORAL | Status: DC

## 2011-08-11 ENCOUNTER — Encounter: Payer: Self-pay | Admitting: Gastroenterology

## 2011-08-11 ENCOUNTER — Ambulatory Visit (AMBULATORY_SURGERY_CENTER): Payer: Medicare Other | Admitting: Gastroenterology

## 2011-08-11 DIAGNOSIS — Z800 Family history of malignant neoplasm of digestive organs: Secondary | ICD-10-CM

## 2011-08-11 DIAGNOSIS — D126 Benign neoplasm of colon, unspecified: Secondary | ICD-10-CM

## 2011-08-11 DIAGNOSIS — Z1211 Encounter for screening for malignant neoplasm of colon: Secondary | ICD-10-CM

## 2011-08-11 DIAGNOSIS — K5730 Diverticulosis of large intestine without perforation or abscess without bleeding: Secondary | ICD-10-CM

## 2011-08-11 MED ORDER — SODIUM CHLORIDE 0.9 % IV SOLN: 500.0000 mL | INTRAVENOUS | Status: DC

## 2011-08-11 NOTE — Patient Instructions (Addendum)
Diverticulosis Diverticulosis is a common condition that develops when small pouches (diverticula) form in the wall of the colon. The risk of diverticulosis increases with age. It happens more often in people who eat a low-fiber diet. Most individuals with diverticulosis have no symptoms. Those individuals with symptoms usually experience belly (abdominal) pain, constipation, or loose stools (diarrhea). HOME CARE INSTRUCTIONS  Increase the amount of fiber in your diet as directed by your caregiver or dietician. This may reduce symptoms of diverticulosis.   Your caregiver may recommend taking a dietary fiber supplement.   Drink at least 6 to 8 glasses of water each day to prevent constipation.   Try not to strain when you have a bowel movement.   Your caregiver may recommend avoiding nuts and seeds to prevent complications, although this is still an uncertain benefit.   Only take over-the-counter or prescription medicines for pain, discomfort, or fever as directed by your caregiver.  FOODS HAVING HIGH FIBER CONTENT INCLUDE:  Fruits. Apple, peach, pear, tangerine, raisins, prunes.   Vegetables. Brussels sprouts, asparagus, broccoli, cabbage, carrot, cauliflower, romaine lettuce, spinach, summer squash, tomato, winter squash, zucchini.   Starchy Vegetables. Baked beans, kidney beans, lima beans, split peas, lentils, potatoes (with skin).   Grains. Whole wheat bread, brown rice, bran flake cereal, plain oatmeal, white rice, shredded wheat, bran muffins.  SEEK IMMEDIATE MEDICAL CARE IF:  You develop increasing pain or severe bloating.  You have an oral temperature above 100  Polyps, Colon  A polyp is extra tissue that grows inside your body. Colon polyps grow in the large intestine. The large intestine, also called the colon, is part of your digestive system. It is a long, hollow tube at the end of your digestive tract where your body makes and stores stool. Most polyps are not dangerous.  They are benign. This means they are not cancerous. But over time, some types of polyps can turn into cancer. Polyps that are smaller than a pea are usually not harmful. But larger polyps could someday become or may already be cancerous. To be safe, doctors remove all polyps and test them.  WHO GETS POLYPS? Anyone can get polyps, but certain people are more likely than others. You may have a greater chance of getting polyps if: You are over 50.  You have had polyps before.  Someone in your family has had polyps.  Someone in your family has had cancer of the large intestine.  Find out if someone in your family has had polyps. You may also be more likely to get polyps if you:  Eat a lot of fatty foods  Smoke  Drink alcohol  Do not exercise Eat too much  SYMPTOMS Most small polyps do not cause symptoms. People often do not know they have one until their caregiver finds it during a regular checkup or while testing them for something else. Some people do have symptoms like these: Bleeding from the anus. You might notice blood on your underwear or on toilet paper after you have had a bowel movement.  Constipation or diarrhea that lasts more than a week.  Blood in the stool. Blood can make stool look black or it can show up as red streaks in the stool.  If you have any of these symptoms, see your caregiver. HOW DOES THE DOCTOR TEST FOR POLYPS? The doctor can use four tests to check for polyps: Digital rectal exam. The caregiver wears gloves and checks your rectum (the last part of the large intestine)  to see if it feels normal. This test would find polyps only in the rectum. Your caregiver may need to do one of the other tests listed below to find polyps higher up in the intestine.  Barium enema. The caregiver puts a liquid called barium into your rectum before taking x-rays of your large intestine. Barium makes your intestine look white in the pictures. Polyps are dark, so they are easy to see.    Sigmoidoscopy. With this test, the caregiver can see inside your large intestine. A thin flexible tube is placed into your rectum. The device is called a sigmoidoscope, which has a light and a tiny video camera in it. The caregiver uses the sigmoidoscope to look at the last third of your large intestine.  Colonoscopy. This test is like sigmoidoscopy, but the caregiver looks at all of the large intestine. It usually requires sedation. This is the most common method for finding and removing polyps.  TREATMENT The caregiver will remove the polyp during sigmoidoscopy or colonoscopy. The polyp is then tested for cancer.  If you have had polyps, your caregiver may want you to get tested regularly in the future.  PREVENTION There is not one sure way to prevent polyps. You might be able to lower your risk of getting them if you: Eat more fruits and vegetables and less fatty food.  Do not smoke.  Avoid alcohol.  Exercise every day.  Lose weight if you are overweight.  Eating more calcium and folate can also lower your risk of getting polyps. Some foods that are rich in calcium are milk, cheese, and broccoli. Some foods that are rich in folate are chickpeas, kidney beans, and spinach.  Aspirin might help prevent polyps. Studies are under way.  Document Released: 07/29/2004 Document Re-Released: 04/22/2010  [facility] Patient Information 2011 [place], [place]., not controlled by medicine.   You develop vomiting or bowel movements that are bloody or black.  Document Released: 07/30/2004 Document Re-Released: 04/22/2010 [facility] Patient Information 2011 [place], [place].  Please review discharge instructions (blue and green sheets)  Resume normal medications  Review information about polyps, high fiber diets, and diverticulosis

## 2011-08-11 NOTE — Progress Notes (Signed)
Pt rhythm strip multifocal PVC's.  I ran a strip and let Dr. Arlyce Dice see it.  He looked up her cardiology visits in the past and Dr. Arlyce Dice siad the cardiologist had described the rhyth the same in the past.  He said we will proceed with the colonoscopy.  Pt will be carefully monitored. Maw  Pt tolerated the colonoscopy exam well. maw

## 2011-08-12 ENCOUNTER — Telehealth: Payer: Self-pay | Admitting: *Deleted

## 2011-08-12 NOTE — Telephone Encounter (Signed)

## 2011-09-28 ENCOUNTER — Telehealth: Payer: Self-pay | Admitting: *Deleted

## 2011-09-28 DIAGNOSIS — E039 Hypothyroidism, unspecified: Secondary | ICD-10-CM

## 2011-09-28 DIAGNOSIS — I10 Essential (primary) hypertension: Secondary | ICD-10-CM

## 2011-09-28 DIAGNOSIS — Z79899 Other long term (current) drug therapy: Secondary | ICD-10-CM

## 2011-09-28 NOTE — Telephone Encounter (Signed)
Patient called and left voice message stating she is scheduled for follow up with Dr. Rodena Medin on 10/02/2011 and she would like to know if she will need blood work.

## 2011-09-28 NOTE — Telephone Encounter (Signed)
Cbc 401.9 chem12 v58.69 tsh hypothyroidism

## 2011-09-29 ENCOUNTER — Other Ambulatory Visit: Payer: Self-pay | Admitting: *Deleted

## 2011-09-29 DIAGNOSIS — Z79899 Other long term (current) drug therapy: Secondary | ICD-10-CM

## 2011-09-29 DIAGNOSIS — I10 Essential (primary) hypertension: Secondary | ICD-10-CM

## 2011-09-29 DIAGNOSIS — E039 Hypothyroidism, unspecified: Secondary | ICD-10-CM

## 2011-09-29 LAB — CBC
HCT: 39.4 % (ref 36.0–46.0)
Hemoglobin: 12.9 g/dL (ref 12.0–15.0)
MCHC: 32.7 g/dL (ref 30.0–36.0)
RBC: 4.42 MIL/uL (ref 3.87–5.11)
WBC: 4.3 10*3/uL (ref 4.0–10.5)

## 2011-09-29 LAB — BASIC METABOLIC PANEL
BUN: 17 mg/dL (ref 6–23)
Chloride: 102 mEq/L (ref 96–112)
Glucose, Bld: 79 mg/dL (ref 70–99)
Potassium: 4.6 mEq/L (ref 3.5–5.3)
Sodium: 137 mEq/L (ref 135–145)

## 2011-09-29 LAB — TSH: TSH: 2.692 u[IU]/mL (ref 0.350–4.500)

## 2011-09-29 NOTE — Telephone Encounter (Signed)
Call placed to patient at [phone],she was informed of the non fasting blood work. Lab orders have been entered.

## 2011-10-01 ENCOUNTER — Ambulatory Visit (INDEPENDENT_AMBULATORY_CARE_PROVIDER_SITE_OTHER): Payer: Medicare Other | Admitting: Internal Medicine

## 2011-10-01 ENCOUNTER — Encounter: Payer: Self-pay | Admitting: Internal Medicine

## 2011-10-01 ENCOUNTER — Ambulatory Visit: Payer: Medicare Other | Admitting: Internal Medicine

## 2011-10-01 DIAGNOSIS — E039 Hypothyroidism, unspecified: Secondary | ICD-10-CM

## 2011-10-01 DIAGNOSIS — K5730 Diverticulosis of large intestine without perforation or abscess without bleeding: Secondary | ICD-10-CM

## 2011-10-01 DIAGNOSIS — I10 Essential (primary) hypertension: Secondary | ICD-10-CM

## 2011-10-01 DIAGNOSIS — K5790 Diverticulosis of intestine, part unspecified, without perforation or abscess without bleeding: Secondary | ICD-10-CM | POA: Insufficient documentation

## 2011-10-01 NOTE — Assessment & Plan Note (Signed)
Stable and asx. Continue current dosing of synthroid.

## 2011-10-01 NOTE — Assessment & Plan Note (Signed)
asx without s/s of diverticulitis. Explained pathophysiology and potential for diverticulitis. Is avoiding small seeds/nuts.

## 2011-10-01 NOTE — Assessment & Plan Note (Signed)
Normotensive and stable. Continue current regimen. Monitor bp as outpt and followup in clinic as scheduled.  

## 2011-10-01 NOTE — Patient Instructions (Signed)
Please schedule chem7 (v58.69) and tsh/free t4 (hypothyroidism) prior to next visit 

## 2011-10-01 NOTE — Progress Notes (Signed)
  Subjective:    Patient ID: Crystal [name], female    DOB: [birth year], 77 y.o.   MRN: [id number]  HPI Pt presents to clinic for followup of multiple medical problems. Has questions about colonoscopy report showing diverticulosis. No abd pain or known h/o diverticulitis. On stable dose of synthroid with no sx's of hypo or hyperthyroidism. TSH reviewed nl. No active complaints.  Past Medical History  Diagnosis Date  . Cardiomyopathy     non ischemic NL cos on cath 08/2009, EF to 15-20%, Her follow up  2D echo  in 10/2009 showed impoved EFo 35-40%  . CHF (congestive heart failure)   . Hypertension   . Anxiety   . History of lymphoma     Dr Drue Dun  . Hypothyroid    Past Surgical History  Procedure Date  . Tonsillectomy 1952  . Dilation and curettage of uterus   . Nasal polyps removed     age 73    reports that she has never smoked. She does not have any smokeless tobacco history on file. She reports that she does not drink alcohol or use illicit drugs. family history includes Colon cancer (age of onset:50) in her brother; Colon polyps in her father; Diabetes in her mother; Other in an unspecified family member; Pneumonia in her father; and Sarcoidosis in her daughter and sister. Allergies  Allergen Reactions  . Iohexol      Desc: hives,itiching      Review of Systems see hpi     Objective:   Physical Exam  Physical Exam  Nursing note and vitals reviewed. Constitutional: Appears well-developed and well-nourished. No distress.  HENT:  Head: Normocephalic and atraumatic.  Right Ear: External ear normal. TM nl  Left Ear: External ear normal. TM nl Eyes: Conjunctivae are normal. No scleral icterus.  Neck: Neck supple. Carotid bruit is not present.  Cardiovascular: Normal rate, regular rhythm and normal heart sounds.  Exam reveals no gallop and no friction rub.   No murmur heard. Pulmonary/Chest: Effort normal and breath sounds normal. No respiratory distress. He has no  wheezes. no rales.  Lymphadenopathy:    He has no cervical adenopathy.  Neurological:Alert.  Skin: Skin is warm and dry. Not diaphoretic.  Psychiatric: Has a normal mood and affect.        Assessment & Plan:

## 2011-10-15 ENCOUNTER — Other Ambulatory Visit (HOSPITAL_BASED_OUTPATIENT_CLINIC_OR_DEPARTMENT_OTHER): Payer: Medicare Other | Admitting: Lab

## 2011-10-15 ENCOUNTER — Other Ambulatory Visit: Payer: Self-pay | Admitting: Hematology & Oncology

## 2011-10-15 ENCOUNTER — Ambulatory Visit (HOSPITAL_BASED_OUTPATIENT_CLINIC_OR_DEPARTMENT_OTHER): Payer: Medicare Other | Admitting: Hematology & Oncology

## 2011-10-15 ENCOUNTER — Encounter: Payer: Self-pay | Admitting: Hematology & Oncology

## 2011-10-15 VITALS — BP 138/66 | HR 63 | Temp 97.1°F | Ht 62.0 in | Wt 140.0 lb

## 2011-10-15 DIAGNOSIS — D72819 Decreased white blood cell count, unspecified: Secondary | ICD-10-CM

## 2011-10-15 DIAGNOSIS — D696 Thrombocytopenia, unspecified: Secondary | ICD-10-CM

## 2011-10-15 DIAGNOSIS — C8589 Other specified types of non-Hodgkin lymphoma, extranodal and solid organ sites: Secondary | ICD-10-CM

## 2011-10-15 DIAGNOSIS — Z87898 Personal history of other specified conditions: Secondary | ICD-10-CM

## 2011-10-15 LAB — COMPREHENSIVE METABOLIC PANEL
AST: 31 U/L (ref 0–37)
Alkaline Phosphatase: 134 U/L — ABNORMAL HIGH (ref 39–117)
BUN: 18 mg/dL (ref 6–23)
Creatinine, Ser: 0.93 mg/dL (ref 0.50–1.10)
Total Bilirubin: 0.5 mg/dL (ref 0.3–1.2)

## 2011-10-15 LAB — CBC WITH DIFFERENTIAL (CANCER CENTER ONLY)
BASO#: 0 10*3/uL (ref 0.0–0.2)
EOS%: 1.9 % (ref 0.0–7.0)
HCT: 39.1 % (ref 34.8–46.6)
HGB: 13.3 g/dL (ref 11.6–15.9)
LYMPH#: 1.6 10*3/uL (ref 0.9–3.3)
LYMPH%: 43.9 % (ref 14.0–48.0)
MCHC: 34 g/dL (ref 32.0–36.0)
MCV: 87 fL (ref 81–101)
MONO#: 0.5 10*3/uL (ref 0.1–0.9)
NEUT%: 41.2 % (ref 39.6–80.0)

## 2011-10-15 LAB — RETICULOCYTES (CHCC)
ABS Retic: 49.7 10*3/uL (ref 19.0–186.0)
Retic Ct Pct: 1.1 % (ref 0.4–2.3)

## 2011-10-15 NOTE — Progress Notes (Signed)
This office note has been dictated.

## 2011-10-16 NOTE — Progress Notes (Signed)
DIAGNOSIS:  Diffuse large cell non-Hodgkin lymphoma, clinical remission.  CURRENT THERAPY:  Observation.  INTERIM HISTORY:  Ms. Renton comes in for her follow-up .  We see her every 6 months.  She is doing well.  She had a nice summer.  She has been very active.  She participates in the [employer].  She usually wins the events that she enters.  She has had no problems with cough or shortness of breath.  She has had a little bit of a rash.  She says that this started when she started taking medications.  She is not a big "medicine taker."  She has not had any bleeding.  There has been no change in bowel or bladder habits.  She continues on Synthroid for hypothyroidism.  She has not noticed any leg swelling.  There have been no joint aches or pains.  PHYSICAL EXAMINATION:  General Appearance:  This is a well-developed, well-nourished African American female in no obvious distress.  Vital Signs:  97.1, pulse 63, respiratory rate 18, blood pressure 138/66. Weight is 140.  Head and Neck Exam:  Shows a normocephalic, atraumatic skull.  There are no ocular or oral lesions.  There are no palpable cervical or supraclavicular lymph nodes.  Lungs:  Clear to percussion and auscultation bilaterally.  Cardiac Exam:  Irregular rate and rhythm. She has numerous extra beats.  No murmurs are noted.  Axillary Exam: Shows no bilateral axillary adenopathy.  Abdominal Exam:  Soft with good bowel sounds.  There is no palpable abdominal mass.  There is no palpable hepatosplenomegaly.  Extremities:  Show no clubbing, cyanosis or edema.  Skin Exam:  No rashes, ecchymosis, or petechia.  LABORATORY STUDIES:  White cell count is 3.7, hemoglobin 13.3, hematocrit 39, platelet count 147.  IMPRESSION:  Ms. Ciccone is a 77 year old African American female with a past history of diffuse large cell lymphoma.  She has been in remission now for 9 years.  I am glad to see that her platelet count is back up.  She  has chronic leukopenia, which is benign.  We will plan to get her back in 6 more months.  I do not see that we need any blood work or x-ray studies.   ______________________________ Josph Macho, M.D. PRE/MEDQ  D:  10/15/2011  T:  10/16/2011  Job:  [id number]

## 2011-11-03 ENCOUNTER — Telehealth: Payer: Self-pay | Admitting: *Deleted

## 2011-11-03 NOTE — Telephone Encounter (Signed)
Patient called and left voice message requesting a return phone call. She would like to discuss stopping the Levothyroxine.  Call returned to patient at  [phone] line busy x 3.

## 2011-11-04 NOTE — Telephone Encounter (Signed)
Call placed to patient at [phone], hair loss, sensitive to heat, nervousness, chest pains, and headaches,. She states that she has had ongoing symptoms and believes the thyroid medication is causing her problems. She stated that she has checked with her pharmacy and they can substitute Amlodipine for Nifedipine, and they did not have a comparison for Bisoprolol. She was advised to contact her insurance company to check for alternative to see what we be least expensive for her on the Bisoprolol.

## 2011-11-04 NOTE — Telephone Encounter (Signed)
Call placed to patient [phone], she was informed per Dr Rodena Medin instructions. She has declined to have the additional blood work done. She states that she will try to continue with current dosing and deal with her headaches.

## 2011-11-04 NOTE — Telephone Encounter (Signed)
tsh was nl 1 month ago suggesting that her thyroid dose was right. If she still has concerns can recheck tsh and free t4 -hypothyroidism

## 2011-11-15 ENCOUNTER — Other Ambulatory Visit: Payer: Self-pay | Admitting: Internal Medicine

## 2012-01-14 ENCOUNTER — Other Ambulatory Visit: Payer: Self-pay | Admitting: Internal Medicine

## 2012-03-02 ENCOUNTER — Other Ambulatory Visit: Payer: Self-pay | Admitting: Internal Medicine

## 2012-03-02 DIAGNOSIS — Z79899 Other long term (current) drug therapy: Secondary | ICD-10-CM

## 2012-03-02 DIAGNOSIS — E039 Hypothyroidism, unspecified: Secondary | ICD-10-CM

## 2012-03-21 ENCOUNTER — Ambulatory Visit (HOSPITAL_BASED_OUTPATIENT_CLINIC_OR_DEPARTMENT_OTHER): Payer: Medicare Other | Admitting: Hematology & Oncology

## 2012-03-21 ENCOUNTER — Other Ambulatory Visit (HOSPITAL_BASED_OUTPATIENT_CLINIC_OR_DEPARTMENT_OTHER): Payer: Medicare Other | Admitting: Lab

## 2012-03-21 VITALS — BP 146/72 | HR 72 | Temp 97.2°F | Ht 62.0 in | Wt 145.0 lb

## 2012-03-21 DIAGNOSIS — D708 Other neutropenia: Secondary | ICD-10-CM | POA: Diagnosis not present

## 2012-03-21 DIAGNOSIS — E039 Hypothyroidism, unspecified: Secondary | ICD-10-CM | POA: Diagnosis not present

## 2012-03-21 DIAGNOSIS — C8589 Other specified types of non-Hodgkin lymphoma, extranodal and solid organ sites: Secondary | ICD-10-CM

## 2012-03-21 DIAGNOSIS — C8590 Non-Hodgkin lymphoma, unspecified, unspecified site: Secondary | ICD-10-CM

## 2012-03-21 DIAGNOSIS — I10 Essential (primary) hypertension: Secondary | ICD-10-CM

## 2012-03-21 DIAGNOSIS — Z79899 Other long term (current) drug therapy: Secondary | ICD-10-CM | POA: Diagnosis not present

## 2012-03-21 LAB — BASIC METABOLIC PANEL
CO2: 27 mEq/L (ref 19–32)
Calcium: 9.8 mg/dL (ref 8.4–10.5)
Chloride: 104 mEq/L (ref 96–112)
Sodium: 140 mEq/L (ref 135–145)

## 2012-03-21 LAB — CBC WITH DIFFERENTIAL (CANCER CENTER ONLY)
Eosinophils Absolute: 0.1 10*3/uL (ref 0.0–0.5)
HCT: 39.3 % (ref 34.8–46.6)
LYMPH#: 1.3 10*3/uL (ref 0.9–3.3)
LYMPH%: 42.2 % (ref 14.0–48.0)
MCV: 90 fL (ref 81–101)
MONO#: 0.4 10*3/uL (ref 0.1–0.9)
Platelets: 136 10*3/uL — ABNORMAL LOW (ref 145–400)
RBC: 4.39 10*6/uL (ref 3.70–5.32)
WBC: 3 10*3/uL — ABNORMAL LOW (ref 3.9–10.0)

## 2012-03-21 LAB — COMPREHENSIVE METABOLIC PANEL
Albumin: 3.9 g/dL (ref 3.5–5.2)
CO2: 32 mEq/L (ref 19–32)
Calcium: 9.5 mg/dL (ref 8.4–10.5)
Chloride: 107 mEq/L (ref 96–112)
Glucose, Bld: 118 mg/dL — ABNORMAL HIGH (ref 70–99)
Potassium: 3.9 mEq/L (ref 3.5–5.3)
Sodium: 143 mEq/L (ref 135–145)
Total Bilirubin: 0.4 mg/dL (ref 0.3–1.2)
Total Protein: 7.7 g/dL (ref 6.0–8.3)

## 2012-03-21 LAB — LACTATE DEHYDROGENASE: LDH: 165 U/L (ref 94–250)

## 2012-03-21 NOTE — Progress Notes (Signed)
This office note has been dictated.

## 2012-03-22 NOTE — Progress Notes (Signed)
CC:   Marguarite Arbour, MD  DIAGNOSES: 1. History of diffuse large-cell non-Hodgkin's lymphoma. 2. Transient leukopenia.  CURRENT THERAPY:  Observation.  INTERIM HISTORY:  Ms. Inzunza comes in for followup.  We see her every 6 months.  Since we last saw her, she has had no real complaints although she wants to get off some of the medicine she is taking.  She is getting worried about a little weight gain.  She does have hypothyroidism.  This is being monitored by Dr. Rodena Medin.  She did have a TSH done back in November which was 2.69.  She has had no problems with fevers, sweats or chills.  There is no change in bowel or bladder habits.  The patient is still participating in [employer].  I think she won 5 events this year so far.  She did have a little bit of a "fall".  She did this while hiking.  She developed a little swelling of the right foot and ankle.  She has no pain in this area.  PHYSICAL EXAM:  This is a well-developed, well-nourished black female in no obvious distress.  Vital signs:  97.4, pulse 72, respiratory rate 18, blood pressure 146/72, weight is 145.  Head/Neck:  A normocephalic, atraumatic skull.  There are no ocular or oral lesions.  There are no palpable cervical or supraclavicular lymph nodes.  Lungs:  Clear bilaterally.  Cardiac:  Regular rate and rhythm with a normal S1, S2. There are no murmurs, rubs or bruits.  Abdomen:  Soft with good bowel sounds.  There is no palpable abdominal mass.  There is no fluid wave. There is no palpable hepatosplenomegaly.  Axillary:  No bilateral axillary adenopathy.  Extremities:  Mild nonpitting edema of the left leg ankle and foot.  She has good pulses in her distal extremities. Skin:  No rash, ecchymosis or petechia.  LABORATORY STUDIES:  White cell count is 3, hemoglobin 13, hematocrit 39, platelet count 136.  IMPRESSION:  Ms. Weisheit is a 78 year old African American female with history of diffuse large-cell lymphoma.  She  has been in remission now for 9-1/2 years.  Again, I do not see any evidence of recurrent lymphoma.  I do not see that we need to do any scans on her.  Her leukopenia is chronic and asymptomatic.  Will go ahead and plan to get her back in 6 more months.   ______________________________ Josph Macho, M.D. PRE/MEDQ  D:  03/21/2012  T:  03/22/2012  Job:  [id number]

## 2012-03-23 ENCOUNTER — Telehealth: Payer: Self-pay | Admitting: *Deleted

## 2012-03-23 NOTE — Telephone Encounter (Signed)
Message copied by Anselm Jungling on Wed Mar 23, 2012  2:17 PM ------      Message from: Arlan Organ R      Created: Mon Mar 21, 2012  9:05 PM       Call - labs are great.  pete

## 2012-03-23 NOTE — Telephone Encounter (Signed)
Called patient to let her know that her labwork looked great per dr. Myna Hidalgo

## 2012-03-24 ENCOUNTER — Encounter: Payer: Self-pay | Admitting: Internal Medicine

## 2012-03-24 ENCOUNTER — Ambulatory Visit (INDEPENDENT_AMBULATORY_CARE_PROVIDER_SITE_OTHER): Payer: Medicare Other | Admitting: Internal Medicine

## 2012-03-24 ENCOUNTER — Other Ambulatory Visit: Payer: Self-pay | Admitting: Internal Medicine

## 2012-03-24 VITALS — BP 118/72 | HR 61 | Temp 97.9°F | Resp 18 | Ht 62.25 in | Wt 144.0 lb

## 2012-03-24 DIAGNOSIS — R7309 Other abnormal glucose: Secondary | ICD-10-CM | POA: Diagnosis not present

## 2012-03-24 DIAGNOSIS — I10 Essential (primary) hypertension: Secondary | ICD-10-CM

## 2012-03-24 DIAGNOSIS — R739 Hyperglycemia, unspecified: Secondary | ICD-10-CM

## 2012-03-24 DIAGNOSIS — E039 Hypothyroidism, unspecified: Secondary | ICD-10-CM

## 2012-03-24 MED ORDER — BISOPROLOL FUMARATE 5 MG PO TABS: 5.0000 mg | ORAL_TABLET | Freq: Every day | ORAL | Status: DC

## 2012-03-24 MED ORDER — LISINOPRIL 10 MG PO TABS: 10.0000 mg | ORAL_TABLET | Freq: Every day | ORAL | Status: DC

## 2012-03-24 MED ORDER — LEVOTHYROXINE SODIUM 25 MCG PO TABS: 25.0000 ug | ORAL_TABLET | Freq: Every day | ORAL | Status: DC

## 2012-03-24 MED ORDER — NIFEDIPINE ER OSMOTIC RELEASE 30 MG PO TB24: 30.0000 mg | ORAL_TABLET | Freq: Every day | ORAL | Status: DC

## 2012-03-24 NOTE — Progress Notes (Signed)
  Subjective:    Patient ID: Crystal [name], female    DOB: 1934/06/25, 78 y.o.   MRN: [id number]  HPI Pt presents to clinic for followup of multiple medical problems. Notes increase in wt since last visit. Is exercising. Reviewed nl tsh/free t4. Has intermittently mildly high glucose on chem7 but some are nonfasting. No h/o DM. BP reviewed normotensive.   Past Medical History  Diagnosis Date  . Cardiomyopathy     non ischemic NL cos on cath 08/2009, EF to 15-20%, Her follow up  2D echo  in 10/2009 showed impoved EFo 35-40%  . CHF (congestive heart failure)   . Hypertension   . Anxiety   . History of lymphoma     Dr Drue Dun  . Hypothyroid    Past Surgical History  Procedure Date  . Tonsillectomy 1952  . Dilation and curettage of uterus   . Nasal polyps removed     age 3    reports that she has never smoked. She has never used smokeless tobacco. She reports that she does not drink alcohol or use illicit drugs. family history includes Colon cancer (age of onset:50) in her brother; Colon polyps in her father; Diabetes in her mother; Other in an unspecified family member; Pneumonia in her father; and Sarcoidosis in her daughter and sister. Allergies  Allergen Reactions  . Iohexol      Desc: hives,itiching       Review of Systems see hpi     Objective:   Physical Exam  Physical Exam  Nursing note and vitals reviewed. Constitutional: Appears well-developed and well-nourished. No distress.  HENT:  Head: Normocephalic and atraumatic.  Right Ear: External ear normal.  Left Ear: External ear normal.  Eyes: Conjunctivae are normal. No scleral icterus.  Neck: Neck supple. Carotid bruit is not present.  Cardiovascular: Normal rate, regular rhythm and normal heart sounds.  Exam reveals no gallop and no friction rub.   No murmur heard. Pulmonary/Chest: Effort normal and breath sounds normal. No respiratory distress. He has no wheezes. no rales.  Lymphadenopathy:    He has no  cervical adenopathy.  Neurological:Alert.  Skin: Skin is warm and dry. Not diaphoretic.  Psychiatric: Has a normal mood and affect.        Assessment & Plan:

## 2012-03-24 NOTE — Assessment & Plan Note (Signed)
Obtain a1c with next visit labs

## 2012-03-24 NOTE — Assessment & Plan Note (Addendum)
Normotensive and stable. Continue current regimen. Monitor bp as outpt and followup in clinic as scheduled. Obtain lipid prior to next visit

## 2012-03-24 NOTE — Patient Instructions (Signed)
Please schedule fasting labs prior to next visit Tsh/free t4-hypothyroidism, a1c-hyperglycemia and lipid-htn

## 2012-03-24 NOTE — Assessment & Plan Note (Signed)
Well controlled. Continue current dosing. Recheck 6 months

## 2012-09-17 ENCOUNTER — Other Ambulatory Visit: Payer: Self-pay | Admitting: Internal Medicine

## 2012-09-19 ENCOUNTER — Other Ambulatory Visit: Payer: Self-pay | Admitting: Internal Medicine

## 2012-09-21 ENCOUNTER — Ambulatory Visit: Payer: Medicare Other | Admitting: Hematology & Oncology

## 2012-09-21 ENCOUNTER — Other Ambulatory Visit (HOSPITAL_BASED_OUTPATIENT_CLINIC_OR_DEPARTMENT_OTHER): Payer: Medicare Other | Admitting: Lab

## 2012-09-21 ENCOUNTER — Ambulatory Visit (HOSPITAL_BASED_OUTPATIENT_CLINIC_OR_DEPARTMENT_OTHER): Payer: Medicare Other | Admitting: Hematology & Oncology

## 2012-09-21 ENCOUNTER — Other Ambulatory Visit: Payer: Medicare Other | Admitting: Lab

## 2012-09-21 VITALS — BP 146/58 | HR 69 | Temp 97.7°F | Resp 20 | Ht 62.0 in | Wt 145.0 lb

## 2012-09-21 DIAGNOSIS — E039 Hypothyroidism, unspecified: Secondary | ICD-10-CM | POA: Diagnosis not present

## 2012-09-21 DIAGNOSIS — I10 Essential (primary) hypertension: Secondary | ICD-10-CM | POA: Diagnosis not present

## 2012-09-21 DIAGNOSIS — C8590 Non-Hodgkin lymphoma, unspecified, unspecified site: Secondary | ICD-10-CM

## 2012-09-21 DIAGNOSIS — R7309 Other abnormal glucose: Secondary | ICD-10-CM | POA: Diagnosis not present

## 2012-09-21 DIAGNOSIS — I519 Heart disease, unspecified: Secondary | ICD-10-CM | POA: Diagnosis not present

## 2012-09-21 DIAGNOSIS — C8589 Other specified types of non-Hodgkin lymphoma, extranodal and solid organ sites: Secondary | ICD-10-CM

## 2012-09-21 DIAGNOSIS — M810 Age-related osteoporosis without current pathological fracture: Secondary | ICD-10-CM

## 2012-09-21 LAB — CBC WITH DIFFERENTIAL (CANCER CENTER ONLY)
BASO#: 0 10*3/uL (ref 0.0–0.2)
BASO%: 0.6 % (ref 0.0–2.0)
EOS%: 4.5 % (ref 0.0–7.0)
HGB: 13.5 g/dL (ref 11.6–15.9)
LYMPH#: 1.5 10*3/uL (ref 0.9–3.3)
MCHC: 33.3 g/dL (ref 32.0–36.0)
NEUT#: 1.6 10*3/uL (ref 1.5–6.5)
WBC: 3.6 10*3/uL — ABNORMAL LOW (ref 3.9–10.0)

## 2012-09-21 LAB — COMPREHENSIVE METABOLIC PANEL
ALT: 41 U/L — ABNORMAL HIGH (ref 0–35)
AST: 27 U/L (ref 0–37)
Albumin: 3.8 g/dL (ref 3.5–5.2)
BUN: 14 mg/dL (ref 6–23)
Calcium: 9.5 mg/dL (ref 8.4–10.5)
Chloride: 104 mEq/L (ref 96–112)
Potassium: 4.2 mEq/L (ref 3.5–5.3)

## 2012-09-21 NOTE — Progress Notes (Signed)
This office note has been dictated.

## 2012-09-22 NOTE — Progress Notes (Signed)
CC:   Crystal Arbour, MD  DIAGNOSIS:  Diffuse large cell non-Hodgkin lymphoma--clinical remission.  CURRENT THERAPY:  Observation.  INTERIM HISTORY:  Crystal [name] comes for followup.  She is doing well.  She had a good summer.  She participated in the [employer].  She has had no problems with fatigue or weakness.  Her heart has been doing well. She is followed by Dr. Rodena Medin.  He is monitoring her thyroid.  REVIEW OF SYSTEMS:  She has had no fevers, sweats, or chills.  She has had no leg swelling.  She has had no change in bowel or bladder habits. There have been no rashes.  PHYSICAL EXAMINATION:  GENERAL:  This is an elderly but well-nourished black female in no obvious distress. VITAL SIGNS:  Temperature 98, pulse 84, respiratory rate 16, blood pressure 122/74. HEAD AND NECK:  She has a normocephalic and atraumatic skull.  There are no ocular or oral lesions.  There are no palpable cervical or supraclavicular lymph nodes. LUNGS:  Clear bilaterally.  There are no rales, wheezes, or rhonchi. CARDIAC:  Regular rate and rhythm with normal S1, S2.  There are no murmurs, rubs, or bruits. ABDOMEN:  Soft with good bowel sounds.  There is no palpable abdominal mass.  There is no palpable hepatosplenomegaly. BACK:  No tenderness over the spine, ribs, or hips. EXTREMITIES:  No clubbing, cyanosis, or edema. SKIN:  No rashes, ecchymosis, or petechia.  LABORATORY STUDIES:  White cell count 3.6, hemoglobin 13.5, hematocrit 40.6, platelet count 138.  IMPRESSION:  Crystal [name] is a very nice 78 year old black female with a history of diffuse large-cell lymphoma.  She is in remission now.  She has been in remission now for 10 years.  She has had some transient cardiac failure.  This has resolved.  She is hypothyroid.  This seems to be under good control via the direction of Dr. Rodena Medin.  We are going to see her back in 6 more months.  I do not see the need for any interim x-rays or lab  work.   ______________________________ Josph Macho, M.D. PRE/MEDQ  D:  09/21/2012  T:  09/22/2012  Job:  [id number]

## 2012-09-27 ENCOUNTER — Ambulatory Visit (INDEPENDENT_AMBULATORY_CARE_PROVIDER_SITE_OTHER): Payer: Medicare Other | Admitting: Internal Medicine

## 2012-09-27 ENCOUNTER — Encounter: Payer: Self-pay | Admitting: Internal Medicine

## 2012-09-27 VITALS — BP 146/74 | HR 61 | Temp 97.8°F | Resp 14 | Ht 62.0 in | Wt 147.0 lb

## 2012-09-27 DIAGNOSIS — I10 Essential (primary) hypertension: Secondary | ICD-10-CM | POA: Diagnosis not present

## 2012-09-27 DIAGNOSIS — R7309 Other abnormal glucose: Secondary | ICD-10-CM

## 2012-09-27 DIAGNOSIS — R739 Hyperglycemia, unspecified: Secondary | ICD-10-CM

## 2012-09-27 DIAGNOSIS — E039 Hypothyroidism, unspecified: Secondary | ICD-10-CM

## 2012-09-27 LAB — LIPID PANEL
HDL: 52 mg/dL (ref >39)
LDL Cholesterol: 116 mg/dL — ABNORMAL HIGH (ref 0–99)
Total CHOL/HDL Ratio: 3.8 Ratio
VLDL: 27 mg/dL (ref 0–40)

## 2012-09-27 NOTE — Progress Notes (Signed)
  Subjective:    Patient ID: Crystal [name], female    DOB: December 10, 1933, 78 y.o.   MRN: [id number]  HPI Pt presents to clinic for followup of multiple medical problems. Blood pressure viewed mildly elevated without symptoms. Has been typically normotensive. Compliant with medications without adverse effect. Weight up 3 pounds since last visit. Declines influenza vaccine.  Past Medical History  Diagnosis Date  . Cardiomyopathy     non ischemic NL cos on cath 08/2009, EF to 15-20%, Her follow up  2D echo  in 10/2009 showed impoved EFo 35-40%  . CHF (congestive heart failure)   . Hypertension   . Anxiety   . History of lymphoma     Dr Drue Dun  . Hypothyroid    Past Surgical History  Procedure Date  . Tonsillectomy 1952  . Dilation and curettage of uterus   . Nasal polyps removed     age 39    reports that she has never smoked. She has never used smokeless tobacco. She reports that she does not drink alcohol or use illicit drugs. family history includes Colon cancer (age of onset:50) in her brother; Colon polyps in her father; Diabetes in her mother; Other in an unspecified family member; Pneumonia in her father; and Sarcoidosis in her daughter and sister. Allergies  Allergen Reactions  . Iohexol      Desc: hives,itiching       Review of Systems see hpi     Objective:   Physical Exam  Physical Exam  Nursing note and vitals reviewed. Constitutional: Appears well-developed and well-nourished. No distress.  HENT:  Head: Normocephalic and atraumatic.  Right Ear: External ear normal.  Left Ear: External ear normal.  Eyes: Conjunctivae are normal. No scleral icterus.  Neck: Neck supple. Carotid bruit is not present.  Cardiovascular: Normal rate, regular rhythm and normal heart sounds.  Exam reveals no gallop and no friction rub.   No murmur heard. Pulmonary/Chest: Effort normal and breath sounds normal. No respiratory distress. He has no wheezes. no rales.  Lymphadenopathy:      He has no cervical adenopathy.  Neurological:Alert.  Skin: Skin is warm and dry. Not diaphoretic.  Psychiatric: Has a normal mood and affect.        Assessment & Plan:

## 2012-10-01 NOTE — Assessment & Plan Note (Signed)
Obtain A1c.  

## 2012-10-01 NOTE — Assessment & Plan Note (Signed)
Obtain TSH and free T4 

## 2012-10-01 NOTE — Assessment & Plan Note (Signed)
Isolated elevation. Asymptomatic. Maintain outpatient blood pressure log to be submitted for review

## 2012-12-06 ENCOUNTER — Telehealth: Payer: Self-pay | Admitting: Cardiology

## 2012-12-06 NOTE — Telephone Encounter (Signed)
Spoke with pt, she requested an appt to follow up and see if she would be able to stop some of her meds. Follow up scheduled in [place].

## 2012-12-06 NOTE — Telephone Encounter (Signed)
PT WANTS TO KNOW IF SHE STILL NEEDS TO BE ON bp meds pls call [phone]

## 2012-12-08 ENCOUNTER — Other Ambulatory Visit: Payer: Self-pay | Admitting: Internal Medicine

## 2012-12-14 ENCOUNTER — Ambulatory Visit: Payer: Medicare Other | Admitting: Cardiology

## 2012-12-14 ENCOUNTER — Telehealth: Payer: Self-pay | Admitting: Cardiology

## 2012-12-14 ENCOUNTER — Ambulatory Visit (INDEPENDENT_AMBULATORY_CARE_PROVIDER_SITE_OTHER): Payer: Medicare Other | Admitting: Cardiology

## 2012-12-14 ENCOUNTER — Encounter: Payer: Self-pay | Admitting: Cardiology

## 2012-12-14 VITALS — BP 158/80 | HR 78 | Wt 146.0 lb

## 2012-12-14 DIAGNOSIS — I428 Other cardiomyopathies: Secondary | ICD-10-CM

## 2012-12-14 DIAGNOSIS — I10 Essential (primary) hypertension: Secondary | ICD-10-CM

## 2012-12-14 DIAGNOSIS — I429 Cardiomyopathy, unspecified: Secondary | ICD-10-CM

## 2012-12-14 NOTE — Assessment & Plan Note (Addendum)
Blood pressure is elevated. However she states it typically is 114/60. I have asked her to follow this and we will increase medications as needed. Question if this is contributing to her cardiomyopathy. She is noted to have left ventricular hypertrophy on her electrocardiogram. She is on Procardia for history of vasospasm.

## 2012-12-14 NOTE — Telephone Encounter (Signed)
Spoke with pt, she will come in at 10:30 today

## 2012-12-14 NOTE — Assessment & Plan Note (Signed)
Plan continue ACE inhibitor and beta blocker. Repeat echocardiogram for LV function.

## 2012-12-14 NOTE — Progress Notes (Signed)
   HPI: Pleasant female for fu of nonischemic cardiomyopathy. Cardiac catheterization in 2010 showed an ejection fraction of 15-20% and normal coronary arteries. Last echocardiogram in December of 2011 revealed an ejection fraction of 35-40% and moderate mitral regurgitation. She was last seen in June 2012. Since then, she denies dyspnea on exertion, orthopnea, PND, pedal edema, palpitations, syncope or exertional chest pain.   Current Outpatient Prescriptions  Medication Sig Dispense Refill  . bisoprolol (ZEBETA) 5 MG tablet take 1 tablet by mouth once daily  30 tablet  2  . levothyroxine (SYNTHROID, LEVOTHROID) 25 MCG tablet       . lisinopril (PRINIVIL,ZESTRIL) 10 MG tablet take 1 tablet by mouth once daily  90 tablet  1  . Multiple Vitamins-Minerals (CENTRUM PO) Take by mouth daily.        Marland Kitchen NIFEDICAL XL 30 MG 24 hr tablet take 1 tablet by mouth once daily  30 each  2     Past Medical History  Diagnosis Date  . Cardiomyopathy     non ischemic NL cos on cath 08/2009, EF to 15-20%, Her follow up  2D echo  in 10/2009 showed impoved EFo 35-40%  . CHF (congestive heart failure)   . Hypertension   . Anxiety   . History of lymphoma     Dr Drue Dun  . Hypothyroid     Past Surgical History  Procedure Date  . Tonsillectomy 1952  . Dilation and curettage of uterus   . Nasal polyps removed     age 79    History   Social History  . Marital Status: Married    Spouse Name: N/A    Number of Children: N/A  . Years of Education: N/A   Occupational History  . Not on file.   Social History Main Topics  . Smoking status: Never Smoker   . Smokeless tobacco: Never Used  . Alcohol Use: No  . Drug Use: No  . Sexually Active: Not on file   Other Topics Concern  . Not on file   Social History Narrative   The patient is married ( husband Lynisha Osuch)  She just recently celebrated  her 52nd anniversary.  She has five children.  There is no active  tobacco or alcohol use history.     Smoking Status:  neverCaffeine use/day:  NoneDoes Patient Exercise:  yes    ROS: no fevers or chills, productive cough, hemoptysis, dysphasia, odynophagia, melena, hematochezia, dysuria, hematuria, rash, seizure activity, orthopnea, PND, pedal edema, claudication. Remaining systems are negative.  Physical Exam: Well-developed well-nourished in no acute distress.  Skin is warm and dry.  HEENT is normal.  Neck is supple.  Chest is clear to auscultation with normal expansion.  Cardiovascular exam is regular rate and rhythm.  Abdominal exam nontender or distended. No masses palpated. Extremities show no edema. neuro grossly intact  ECG sinus rhythm with frequent PVCs and couplets. Left ventricular hypertrophy. Left axis deviation. Nonspecific ST changes.

## 2012-12-14 NOTE — Patient Instructions (Addendum)
Your physician recommends that you schedule a follow-up appointment in: 3 MONTHS WITH DR Jens Som IN [place]  Your physician has requested that you have an echocardiogram. Echocardiography is a painless test that uses sound waves to create images of your heart. It provides your doctor with information about the size and shape of your heart and how well your heart's chambers and valves are working. This procedure takes approximately one hour. There are no restrictions for this procedure.

## 2012-12-14 NOTE — Telephone Encounter (Signed)
Pt would like to come today

## 2012-12-19 ENCOUNTER — Other Ambulatory Visit: Payer: Self-pay | Admitting: Internal Medicine

## 2012-12-20 ENCOUNTER — Other Ambulatory Visit (HOSPITAL_COMMUNITY): Payer: Medicare Other

## 2012-12-20 ENCOUNTER — Ambulatory Visit (HOSPITAL_COMMUNITY): Payer: Medicare Other | Attending: Cardiovascular Disease

## 2012-12-20 DIAGNOSIS — I059 Rheumatic mitral valve disease, unspecified: Secondary | ICD-10-CM | POA: Insufficient documentation

## 2012-12-20 DIAGNOSIS — I428 Other cardiomyopathies: Secondary | ICD-10-CM | POA: Insufficient documentation

## 2012-12-20 DIAGNOSIS — I509 Heart failure, unspecified: Secondary | ICD-10-CM | POA: Diagnosis not present

## 2012-12-20 DIAGNOSIS — C8589 Other specified types of non-Hodgkin lymphoma, extranodal and solid organ sites: Secondary | ICD-10-CM | POA: Insufficient documentation

## 2012-12-20 DIAGNOSIS — I10 Essential (primary) hypertension: Secondary | ICD-10-CM | POA: Diagnosis not present

## 2012-12-20 DIAGNOSIS — I429 Cardiomyopathy, unspecified: Secondary | ICD-10-CM

## 2012-12-20 NOTE — Progress Notes (Signed)
Echocardiogram performed.  

## 2013-02-06 ENCOUNTER — Other Ambulatory Visit: Payer: Self-pay | Admitting: Internal Medicine

## 2013-02-15 ENCOUNTER — Ambulatory Visit (INDEPENDENT_AMBULATORY_CARE_PROVIDER_SITE_OTHER): Payer: Medicare Other | Admitting: Cardiology

## 2013-02-15 ENCOUNTER — Encounter: Payer: Self-pay | Admitting: Cardiology

## 2013-02-15 VITALS — BP 140/80 | HR 70 | Ht 62.0 in | Wt 148.0 lb

## 2013-02-15 DIAGNOSIS — I428 Other cardiomyopathies: Secondary | ICD-10-CM | POA: Diagnosis not present

## 2013-02-15 DIAGNOSIS — I10 Essential (primary) hypertension: Secondary | ICD-10-CM | POA: Diagnosis not present

## 2013-02-15 MED ORDER — LISINOPRIL 20 MG PO TABS: 20.0000 mg | ORAL_TABLET | Freq: Every day | ORAL | Status: DC

## 2013-02-15 NOTE — Assessment & Plan Note (Signed)
Adjust blood pressure medications as described under cardiomyopathy.

## 2013-02-15 NOTE — Progress Notes (Signed)
   HPI: Pleasant female for fu of nonischemic cardiomyopathy. Cardiac catheterization in 2010 showed an ejection fraction of 15-20% and normal coronary arteries. Last echocardiogram in Feb 2014 showed an EF of 30-35, grade 1 diastolic dysfunction and moderate MR. She was last seen in Jan 2014. Since then, she denies dyspnea on exertion, orthopnea, PND, pedal edema, palpitations, syncope or exertional chest pain.   Current Outpatient Prescriptions  Medication Sig Dispense Refill  . bisoprolol (ZEBETA) 5 MG tablet take 1 tablet by mouth once daily  30 tablet  2  . levothyroxine (SYNTHROID, LEVOTHROID) 25 MCG tablet 1/2 tab po qd      . lisinopril (PRINIVIL,ZESTRIL) 10 MG tablet take 1 tablet by mouth once daily  90 tablet  1  . Multiple Vitamins-Minerals (CENTRUM PO) Take by mouth daily.        Marland Kitchen NIFEDICAL XL 30 MG 24 hr tablet take 1 tablet by mouth once daily  30 each  2   No current facility-administered medications for this visit.     Past Medical History  Diagnosis Date  . Cardiomyopathy     non ischemic NL cos on cath 08/2009, EF to 15-20%, Her follow up  2D echo  in 10/2009 showed impoved EFo 35-40%  . CHF (congestive heart failure)   . Hypertension   . Anxiety   . History of lymphoma     Dr Drue Dun  . Hypothyroid     Past Surgical History  Procedure Laterality Date  . Tonsillectomy  1952  . Dilation and curettage of uterus    . Nasal polyps removed      age 89    History   Social History  . Marital Status: Married    Spouse Name: N/A    Number of Children: N/A  . Years of Education: N/A   Occupational History  . Not on file.   Social History Main Topics  . Smoking status: Never Smoker   . Smokeless tobacco: Never Used  . Alcohol Use: No  . Drug Use: No  . Sexually Active: Not on file   Other Topics Concern  . Not on file   Social History Narrative   The patient is married ( husband Shaya Reddick)  She just recently celebrated     her 52nd anniversary.   She has five children.  There is no active     tobacco or alcohol use history.          Smoking Status:  never   Caffeine use/day:  None   Does Patient Exercise:  yes    ROS: no fevers or chills, productive cough, hemoptysis, dysphasia, odynophagia, melena, hematochezia, dysuria, hematuria, rash, seizure activity, orthopnea, PND, pedal edema, claudication. Remaining systems are negative.  Physical Exam: Well-developed well-nourished in no acute distress.  Skin is warm and dry.  HEENT is normal.  Neck is supple.  Chest is clear to auscultation with normal expansion.  Cardiovascular exam is regular rate and rhythm.  Abdominal exam nontender or distended. No masses palpated. Extremities show no edema. neuro grossly intact

## 2013-02-15 NOTE — Patient Instructions (Addendum)
Your physician recommends that you schedule a follow-up appointment in: 8 WEEKS WITH DR CRENSHAW IN [place]  STOP NIFEDIPINE  INCREASE LISINOPRIL TO 20 MG ONCE DAILY  Your physician recommends that you return for lab work in: ONE WEEK IN THE [place] OFFICE

## 2013-02-15 NOTE — Assessment & Plan Note (Signed)
Patient with history of nonischemic cardiomyopathy. LV function is decreased compared to previous. Discontinue Procardia. Increase lisinopril to 20 mg daily and continue bisoprolol. We will continue to titrate beta blocker and ACE inhibitor as tolerated her pulse and blood pressure. Once fully titrated I will plan a MUGA to reassess LV function. If ejection fraction less than 35% we will discuss ICD.

## 2013-02-24 DIAGNOSIS — I10 Essential (primary) hypertension: Secondary | ICD-10-CM | POA: Diagnosis not present

## 2013-02-24 LAB — BASIC METABOLIC PANEL WITH GFR
Calcium: 9.7 mg/dL (ref 8.4–10.5)
Creat: 0.95 mg/dL (ref 0.50–1.10)
GFR, Est African American: 67 mL/min
Glucose, Bld: 100 mg/dL — ABNORMAL HIGH (ref 70–99)
Sodium: 140 mEq/L (ref 135–145)

## 2013-02-28 ENCOUNTER — Ambulatory Visit: Payer: Medicare Other | Admitting: Internal Medicine

## 2013-03-12 ENCOUNTER — Other Ambulatory Visit: Payer: Self-pay | Admitting: Family

## 2013-03-22 ENCOUNTER — Ambulatory Visit: Payer: Medicare Other | Admitting: Hematology & Oncology

## 2013-03-22 ENCOUNTER — Other Ambulatory Visit: Payer: Medicare Other | Admitting: Lab

## 2013-03-23 ENCOUNTER — Ambulatory Visit (HOSPITAL_BASED_OUTPATIENT_CLINIC_OR_DEPARTMENT_OTHER): Payer: Medicare Other | Admitting: Hematology & Oncology

## 2013-03-23 ENCOUNTER — Other Ambulatory Visit (HOSPITAL_BASED_OUTPATIENT_CLINIC_OR_DEPARTMENT_OTHER): Payer: Medicare Other | Admitting: Lab

## 2013-03-23 VITALS — BP 163/66 | HR 57 | Temp 98.2°F | Resp 16 | Ht 62.0 in | Wt 147.0 lb

## 2013-03-23 DIAGNOSIS — E559 Vitamin D deficiency, unspecified: Secondary | ICD-10-CM | POA: Diagnosis not present

## 2013-03-23 DIAGNOSIS — M810 Age-related osteoporosis without current pathological fracture: Secondary | ICD-10-CM

## 2013-03-23 DIAGNOSIS — E039 Hypothyroidism, unspecified: Secondary | ICD-10-CM

## 2013-03-23 DIAGNOSIS — C8589 Other specified types of non-Hodgkin lymphoma, extranodal and solid organ sites: Secondary | ICD-10-CM

## 2013-03-23 DIAGNOSIS — C8590 Non-Hodgkin lymphoma, unspecified, unspecified site: Secondary | ICD-10-CM

## 2013-03-23 LAB — CBC WITH DIFFERENTIAL (CANCER CENTER ONLY)
BASO%: 0.6 % (ref 0.0–2.0)
EOS%: 1.8 % (ref 0.0–7.0)
Eosinophils Absolute: 0.1 10*3/uL (ref 0.0–0.5)
LYMPH%: 41.3 % (ref 14.0–48.0)
MCH: 29.7 pg (ref 26.0–34.0)
MCHC: 33.1 g/dL (ref 32.0–36.0)
MONO%: 13.4 % — ABNORMAL HIGH (ref 0.0–13.0)
NEUT#: 1.4 10*3/uL — ABNORMAL LOW (ref 1.5–6.5)
Platelets: 139 10*3/uL — ABNORMAL LOW (ref 145–400)
RBC: 4.51 10*6/uL (ref 3.70–5.32)
RDW: 12.3 % (ref 11.1–15.7)

## 2013-03-23 NOTE — Progress Notes (Signed)
This office note has been dictated.

## 2013-03-24 LAB — COMPREHENSIVE METABOLIC PANEL
Albumin: 4 g/dL (ref 3.5–5.2)
BUN: 12 mg/dL (ref 6–23)
CO2: 26 mEq/L (ref 19–32)
Glucose, Bld: 96 mg/dL (ref 70–99)
Sodium: 140 mEq/L (ref 135–145)
Total Bilirubin: 0.4 mg/dL (ref 0.3–1.2)
Total Protein: 7.9 g/dL (ref 6.0–8.3)

## 2013-03-24 LAB — VITAMIN D 25 HYDROXY (VIT D DEFICIENCY, FRACTURES): Vit D, 25-Hydroxy: 33 ng/mL (ref 30–89)

## 2013-03-24 NOTE — Progress Notes (Signed)
CC:   Crystal Edge, MD  DIAGNOSIS:  Diffuse large cell lymphoma-clinical remission.  CURRENT THERAPY:  Observation.  INTERIM HISTORY:  Crystal [name] comes in for her followup.  We see her every 6 months.  I think we can probably move her out to every year now.  It has been about, I think, 12 years since she had treatment and has been in remission.  She still doing a lot of the [employer].  She keeps Engineer, civil (consulting).  Thankfully, she enjoys what she does and really is being blessed by God with this ability.  She has had no cough or shortness of breath.  There has been no change in bowel or bladder habits.  She does have hypothyroidism.  I think this is being followed by her family doctor, Dr. Abner Greenspan.  She is on Synthroid.  She only takes 0.0125 mg daily.  Her last TSH back in November was 2.7.  There have been no rashes.  She has had no swollen lymph nodes.  There has been no dysphagia or odynophagia.  PHYSICAL EXAMINATION:  General:  This is an elderly, but well-nourished black female in no obvious distress.  Vital signs:  Temperature of 98.2, pulse 57, respiratory rate 18, blood pressure 163/66.  Weight is 147. Head and neck:  Normocephalic, atraumatic skull.  There are no ocular or oral lesions.  There are no palpable cervical or supraclavicular lymph nodes.  Lungs:  Clear bilaterally.  Cardiac:  Regular rate and rhythm with a normal S1 and S2.  There are no murmurs, rubs, or bruits. Abdomen:  Soft with good bowel sounds.  There is no palpable abdominal mass.  There is no fluid wave.  There is no palpable hepatosplenomegaly. Back:  No tenderness over the spine, ribs, or hips.  Extremities:  No clubbing, cyanosis or edema.  LABORATORY STUDIES:  White cell count is 3.3, hemoglobin 13.4, hematocrit 40.5, platelet count 139.  IMPRESSION:  Crystal [name] is a nice 79 year old African American female with history of diffuse large cell lymphoma.  She is in remission.  She  has been in remission now for about 12 years.  I think we can probably follow her yearly now.  I do not see any need for scans on her.  As always, we have had good fellowship.    ______________________________ Josph Macho, M.D. PRE/MEDQ  D:  03/23/2013  T:  03/24/2013  Job:  [id number]

## 2013-03-28 ENCOUNTER — Telehealth: Payer: Self-pay | Admitting: Hematology & Oncology

## 2013-03-28 NOTE — Telephone Encounter (Addendum)
Message copied by Cathi Roan on Tue Mar 28, 2013  3:02 PM ------      Message from: Josph Macho      Created: Tue Mar 28, 2013  7:36 AM       Call and let her know that her labs look okay. Thanks. Pete ------  2-13-  3:02  Called patient at home and left message regarding above MD message, and if any questions to call office.   Lupita Raider LPN

## 2013-04-05 ENCOUNTER — Ambulatory Visit (INDEPENDENT_AMBULATORY_CARE_PROVIDER_SITE_OTHER): Payer: Medicare Other | Admitting: Cardiology

## 2013-04-05 ENCOUNTER — Encounter: Payer: Self-pay | Admitting: Cardiology

## 2013-04-05 VITALS — BP 174/80 | HR 58 | Wt 148.0 lb

## 2013-04-05 DIAGNOSIS — I10 Essential (primary) hypertension: Secondary | ICD-10-CM

## 2013-04-05 MED ORDER — LISINOPRIL 40 MG PO TABS: 40.0000 mg | ORAL_TABLET | Freq: Every day | ORAL | Status: DC

## 2013-04-05 NOTE — Assessment & Plan Note (Signed)
Possibly related to hypertension. She has tracked her blood pressure at home and her systolic is in the 130 to 150 range. I will increase her lisinopril to 40 mg daily. Continue bisoprolol. Check potassium and renal function in one week. She will continue to monitor her blood pressure at home. Once it is controlled we will plan to repeat her echocardiogram. If ejection fraction less than 35% we will consider ICD.

## 2013-04-05 NOTE — Patient Instructions (Addendum)
Your physician recommends that you schedule a follow-up appointment in: 3 MONTHS WITH DR CRENSHAW  INCREASE LISINOPRIL TO 40 MG ONCE DAILY  Your physician recommends that you return for lab work in: ONE WEEK IN [place]

## 2013-04-05 NOTE — Progress Notes (Signed)
   HPI: Pleasant female for fu of nonischemic cardiomyopathy. Cardiac catheterization in 2010 showed an ejection fraction of 15-20% and normal coronary arteries. Last echocardiogram in Feb 2014 showed an EF of 30-35, grade 1 diastolic dysfunction and moderate MR. She was last seen in April 2014. Since then, she denies dyspnea on exertion, orthopnea, PND, pedal edema, palpitations, syncope or exertional chest pain.   Current Outpatient Prescriptions  Medication Sig Dispense Refill  . bisoprolol (ZEBETA) 5 MG tablet take 1 tablet by mouth once daily  30 tablet  2  . levothyroxine (SYNTHROID, LEVOTHROID) 25 MCG tablet 1/2 tab po qd      . lisinopril (PRINIVIL,ZESTRIL) 20 MG tablet Take 1 tablet (20 mg total) by mouth daily.  30 tablet  12  . Multiple Vitamins-Minerals (CENTRUM PO) Take by mouth daily.         No current facility-administered medications for this visit.     Past Medical History  Diagnosis Date  . Cardiomyopathy     non ischemic NL cos on cath 08/2009, EF to 15-20%, Her follow up  2D echo  in 10/2009 showed impoved EFo 35-40%  . CHF (congestive heart failure)   . Hypertension   . Anxiety   . History of lymphoma     Dr Drue Dun  . Hypothyroid     Past Surgical History  Procedure Laterality Date  . Tonsillectomy  1952  . Dilation and curettage of uterus    . Nasal polyps removed      age 50    History   Social History  . Marital Status: Married    Spouse Name: N/A    Number of Children: N/A  . Years of Education: N/A   Occupational History  . Not on file.   Social History Main Topics  . Smoking status: Never Smoker   . Smokeless tobacco: Never Used  . Alcohol Use: No  . Drug Use: No  . Sexually Active: Not on file   Other Topics Concern  . Not on file   Social History Narrative   The patient is married ( husband Bea Duren)  She just recently celebrated     her 52nd anniversary.  She has five children.  There is no active     tobacco or alcohol use  history.          Smoking Status:  never   Caffeine use/day:  None   Does Patient Exercise:  yes    ROS: no fevers or chills, productive cough, hemoptysis, dysphasia, odynophagia, melena, hematochezia, dysuria, hematuria, rash, seizure activity, orthopnea, PND, pedal edema, claudication. Remaining systems are negative.  Physical Exam: Well-developed well-nourished in no acute distress.  Skin is warm and dry.  HEENT is normal.  Neck is supple.  Chest is clear to auscultation with normal expansion.  Cardiovascular exam is regular rate and rhythm.  Abdominal exam nontender or distended. No masses palpated. Extremities show no edema. neuro grossly intact  ECG sinus bradycardia, left ventricular hypertrophy, nonspecific ST changes.

## 2013-04-05 NOTE — Assessment & Plan Note (Signed)
Increase lisinopril as described under cardiomyopathy.

## 2013-04-11 ENCOUNTER — Encounter: Payer: Self-pay | Admitting: Family Medicine

## 2013-04-11 ENCOUNTER — Ambulatory Visit (INDEPENDENT_AMBULATORY_CARE_PROVIDER_SITE_OTHER): Payer: Medicare Other | Admitting: Family Medicine

## 2013-04-11 VITALS — BP 138/72 | HR 64 | Temp 98.6°F | Ht 62.0 in | Wt 147.1 lb

## 2013-04-11 DIAGNOSIS — E039 Hypothyroidism, unspecified: Secondary | ICD-10-CM

## 2013-04-11 DIAGNOSIS — R7309 Other abnormal glucose: Secondary | ICD-10-CM

## 2013-04-11 DIAGNOSIS — D696 Thrombocytopenia, unspecified: Secondary | ICD-10-CM

## 2013-04-11 DIAGNOSIS — I10 Essential (primary) hypertension: Secondary | ICD-10-CM

## 2013-04-11 DIAGNOSIS — R739 Hyperglycemia, unspecified: Secondary | ICD-10-CM

## 2013-04-11 LAB — CBC
Hemoglobin: 14 g/dL (ref 12.0–15.0)
Platelets: 137 10*3/uL — ABNORMAL LOW (ref 150–400)
RBC: 4.89 MIL/uL (ref 3.87–5.11)
WBC: 3.3 10*3/uL — ABNORMAL LOW (ref 4.0–10.5)

## 2013-04-11 LAB — BASIC METABOLIC PANEL WITH GFR
CO2: 27 mEq/L (ref 19–32)
Calcium: 10 mg/dL (ref 8.4–10.5)
GFR, Est African American: 71 mL/min
Potassium: 4.8 mEq/L (ref 3.5–5.3)
Sodium: 139 mEq/L (ref 135–145)

## 2013-04-11 LAB — T4, FREE: Free T4: 0.93 ng/dL (ref 0.80–1.80)

## 2013-04-11 LAB — TSH: TSH: 2.574 u[IU]/mL (ref 0.350–4.500)

## 2013-04-11 LAB — HEMOGLOBIN A1C: Hgb A1c MFr Bld: 5.5 % (ref <5.7)

## 2013-04-11 NOTE — Patient Instructions (Addendum)
64 oz of fluids daily Probiotic daily such as Digestive Advantage or a generic For the hair try Biotin and the Cod Liver oil  Call the eye doctor for exam  Hypothyroidism The thyroid is a large gland located in the lower front of your neck. The thyroid gland helps control metabolism. Metabolism is how your body handles food. It controls metabolism with the hormone thyroxine. When this gland is underactive (hypothyroid), it produces too little hormone.  CAUSES These include:   Absence or destruction of thyroid tissue.  Goiter due to iodine deficiency.  Goiter due to medications.  Congenital defects (since birth).  Problems with the pituitary. This causes a lack of TSH (thyroid stimulating hormone). This hormone tells the thyroid to turn out more hormone. SYMPTOMS  Lethargy (feeling as though you have no energy)  Cold intolerance  Weight gain (in spite of normal food intake)  Dry skin  Coarse hair  Menstrual irregularity (if severe, may lead to infertility)  Slowing of thought processes Cardiac problems are also caused by insufficient amounts of thyroid hormone. Hypothyroidism in the newborn is cretinism, and is an extreme form. It is important that this form be treated adequately and immediately or it will lead rapidly to retarded physical and mental development. DIAGNOSIS  To prove hypothyroidism, your caregiver may do blood tests and ultrasound tests. Sometimes the signs are hidden. It may be necessary for your caregiver to watch this illness with blood tests either before or after diagnosis and treatment. TREATMENT  Low levels of thyroid hormone are increased by using synthetic thyroid hormone. This is a safe, effective treatment. It usually takes about four weeks to gain the full effects of the medication. After you have the full effect of the medication, it will generally take another four weeks for problems to leave. Your caregiver may start you on low doses. If you have  had heart problems the dose may be gradually increased. It is generally not an emergency to get rapidly to normal. HOME CARE INSTRUCTIONS   Take your medications as your caregiver suggests. Let your caregiver know of any medications you are taking or start taking. Your caregiver will help you with dosage schedules.  As your condition improves, your dosage needs may increase. It will be necessary to have continuing blood tests as suggested by your caregiver.  Report all suspected medication side effects to your caregiver. SEEK MEDICAL CARE IF: Seek medical care if you develop:  Sweating.  Tremulousness (tremors).  Anxiety.  Rapid weight loss.  Heat intolerance.  Emotional swings.  Diarrhea.  Weakness. SEEK IMMEDIATE MEDICAL CARE IF:  You develop chest pain, an irregular heart beat (palpitations), or a rapid heart beat. MAKE SURE YOU:   Understand these instructions.  Will watch your condition.  Will get help right away if you are not doing well or get worse. Document Released: 11/02/2005 Document Revised: 01/25/2012 Document Reviewed: 06/22/2008 [facility] Patient Information 2014 [place], [place].

## 2013-04-11 NOTE — Progress Notes (Signed)
Patient ID: Crystal [name], female   DOB: Oct 03, 1934, 79 y.o.   MRN: [id number] Crystal [name] [id number] September 20, 1934 04/11/2013      Progress Note-Follow Up  Subjective  Chief Complaint  Chief Complaint  Patient presents with  . Follow-up    6 month    HPI  Patient is a 79 year old female in today for followup doing well. States he takes regularly and exercises routinely has been feeling well except for some complaints of stress and occasional headaches. C. fully she takes the full dose of thyroxine headaches are worse. As usual she's been taking just half a tablet she says was just half a tablet the headaches are better. Blood pressure has been between 1 and 60 at home worse when she is excited or stressed. Complaint is of some clear and her field of vision assessment and watching TV. No chest pain, palpitations, shortness of breath, GI or GU concerns noted.  Past Medical History  Diagnosis Date  . Cardiomyopathy     non ischemic NL cos on cath 08/2009, EF to 15-20%, Her follow up  2D echo  in 10/2009 showed impoved EFo 35-40%  . CHF (congestive heart failure)   . Hypertension   . Anxiety   . History of lymphoma     Dr Drue Dun  . Hypothyroid     Past Surgical History  Procedure Laterality Date  . Tonsillectomy  1952  . Dilation and curettage of uterus    . Nasal polyps removed      age 17    Family History  Problem Relation Age of Onset  . Diabetes Mother     deceased secondary to diabetes  . Pneumonia Father     died age 71 due to complications of pneumonia  . Colon polyps Father   . Sarcoidosis Daughter   . Sarcoidosis Sister     may have had  . Other      no obvious premature cardiovascular disease or familial cardiomyopathy is noted  . Colon cancer Brother 26    History   Social History  . Marital Status: Married    Spouse Name: N/A    Number of Children: N/A  . Years of Education: N/A   Occupational History  . Not on file.   Social History Main  Topics  . Smoking status: Never Smoker   . Smokeless tobacco: Never Used  . Alcohol Use: No  . Drug Use: No  . Sexually Active: Not on file   Other Topics Concern  . Not on file   Social History Narrative   The patient is married ( husband Crystal [name])  She just recently celebrated     her 52nd anniversary.  She has five children.  There is no active     tobacco or alcohol use history.          Smoking Status:  never   Caffeine use/day:  None   Does Patient Exercise:  yes    Current Outpatient Prescriptions on File Prior to Visit  Medication Sig Dispense Refill  . bisoprolol (ZEBETA) 5 MG tablet take 1 tablet by mouth once daily  30 tablet  2  . levothyroxine (SYNTHROID, LEVOTHROID) 25 MCG tablet 1/2 tab po qd      . lisinopril (PRINIVIL,ZESTRIL) 40 MG tablet Take 1 tablet (40 mg total) by mouth daily.  30 tablet  12  . Multiple Vitamins-Minerals (CENTRUM PO) Take by mouth daily.         No  current facility-administered medications on file prior to visit.    Allergies  Allergen Reactions  . Iohexol      Desc: hives,itiching     Review of Systems  Review of Systems  Constitutional: Negative for fever and malaise/fatigue.  HENT: Negative for congestion.   Eyes: Positive for blurred vision. Negative for double vision, photophobia, pain, discharge and redness.  Respiratory: Negative for shortness of breath.   Cardiovascular: Negative for chest pain, palpitations and leg swelling.  Gastrointestinal: Negative for nausea, abdominal pain and diarrhea.  Genitourinary: Negative for dysuria.  Musculoskeletal: Negative for falls.  Skin: Negative for rash.  Neurological: Negative for loss of consciousness and headaches.  Endo/Heme/Allergies: Negative for polydipsia.  Psychiatric/Behavioral: Negative for depression and suicidal ideas. The patient is not nervous/anxious and does not have insomnia.     Objective  BP 138/72  Pulse 64  Temp(Src) 98.6 F (37 C) (Oral)  Ht 5'  2" (1.575 m)  Wt 147 lb 1.3 oz (66.715 kg)  BMI 26.89 kg/m2  SpO2 98%  Physical Exam  Physical Exam  Constitutional: She is oriented to person, place, and time and well-developed, well-nourished, and in no distress. No distress.  HENT:  Head: Normocephalic and atraumatic.  Eyes: Conjunctivae are normal.  Neck: Neck supple. No thyromegaly present.  Cardiovascular: Normal rate, regular rhythm and normal heart sounds.   No murmur heard. Pulmonary/Chest: Effort normal and breath sounds normal. She has no wheezes.  Abdominal: She exhibits no distension and no mass.  Musculoskeletal: She exhibits no edema.  Lymphadenopathy:    She has no cervical adenopathy.  Neurological: She is alert and oriented to person, place, and time.  Skin: Skin is warm and dry. No rash noted. She is not diaphoretic.  Psychiatric: Memory, affect and judgment normal.   Lab Results  Component Value Date   TSH 2.773 09/27/2012   Lab Results  Component Value Date   WBC 3.3* 03/23/2013   HGB 13.4 03/23/2013   HCT 40.5 03/23/2013   MCV 90 03/23/2013   PLT 139* 03/23/2013   Lab Results  Component Value Date   CREATININE 0.87 03/23/2013   BUN 12 03/23/2013   NA 140 03/23/2013   K 4.1 03/23/2013   CL 105 03/23/2013   CO2 26 03/23/2013   Lab Results  Component Value Date   ALT 23 03/23/2013   AST 26 03/23/2013   ALKPHOS 104 03/23/2013   BILITOT 0.4 03/23/2013   Lab Results  Component Value Date   CHOL 195 09/27/2012   Lab Results  Component Value Date   HDL 52 09/27/2012   Lab Results  Component Value Date   LDLCALC 116* 09/27/2012   Lab Results  Component Value Date   TRIG 134 09/27/2012   Lab Results  Component Value Date   CHOLHDL 3.8 09/27/2012     Assessment & Plan  HYPERTENSION Lisinopril has been increased to 40 mg, systolic bp range between 118-160 at home only at the high end with excitation. Encouraged DASH diet and monitor bp call if running hi or feeling poorly.  HYPOTHYROIDISM tsh wnl. Continue  current dose of Levothyroxine.  Hyperglycemia hgba1c 5.5. Patient c/o a glare around her field of vision while watching TV recently. Patient agrees to establish with an eye doctor

## 2013-04-12 ENCOUNTER — Telehealth: Payer: Self-pay | Admitting: Cardiology

## 2013-04-12 NOTE — Telephone Encounter (Signed)
New Problem:    Patient returned you call regarding her latest labs.  Please call back.

## 2013-04-12 NOTE — Telephone Encounter (Signed)
Spoke with pt, aware of labs 

## 2013-04-13 ENCOUNTER — Telehealth: Payer: Self-pay | Admitting: *Deleted

## 2013-04-13 NOTE — Telephone Encounter (Signed)
Patient notified of lab results. Patient wanted to know if she is to continue to take levothyroxine 1/2 tablet daily. If so patient is needing refill. Please advise?

## 2013-04-13 NOTE — Telephone Encounter (Signed)
Message copied by Marlene Lard on Thu Apr 13, 2013  4:28 PM ------      Message from: Danise Edge A      Created: Tue Apr 11, 2013  7:32 PM       Notify labs look good, sugar good, thyroid normal, EKG without changes ------

## 2013-04-13 NOTE — Telephone Encounter (Signed)
Her numbers for her thyroid are perfect on her Levothyroxine 12.5 mg daily (1/2 of a 25 mg tab), OK to send in #30 with 5 rf

## 2013-04-14 ENCOUNTER — Telehealth: Payer: Self-pay

## 2013-04-14 MED ORDER — LEVOTHYROXINE SODIUM 25 MCG PO TABS: 25.0000 ug | ORAL_TABLET | Freq: Every day | ORAL | Status: DC

## 2013-04-14 NOTE — Telephone Encounter (Signed)
rx sent in to pharmacy. Patient notified.

## 2013-04-14 NOTE — Telephone Encounter (Signed)
Pharmacist called asking what pts Levothyroxine dose was supposed to be because it states 1 tab and 1/2 tab.  It is supposed to be 1/2 of 25 mcg.  Pharmacist informed

## 2013-04-16 NOTE — Assessment & Plan Note (Signed)
hgba1c 5.5. Patient c/o a glare around her field of vision while watching TV recently. Patient agrees to establish with an eye doctor

## 2013-04-16 NOTE — Assessment & Plan Note (Signed)
tsh wnl. Continue current dose of Levothyroxine.

## 2013-04-16 NOTE — Assessment & Plan Note (Signed)
Lisinopril has been increased to 40 mg, systolic bp range between 118-160 at home only at the high end with excitation. Encouraged DASH diet and monitor bp call if running hi or feeling poorly.

## 2013-06-18 ENCOUNTER — Other Ambulatory Visit: Payer: Self-pay | Admitting: Family Medicine

## 2013-06-19 NOTE — Telephone Encounter (Signed)
Rx request to pharmacy/SLS  

## 2013-07-05 ENCOUNTER — Encounter: Payer: Self-pay | Admitting: Cardiology

## 2013-07-05 ENCOUNTER — Ambulatory Visit (INDEPENDENT_AMBULATORY_CARE_PROVIDER_SITE_OTHER): Payer: Medicare Other | Admitting: Cardiology

## 2013-07-05 VITALS — BP 152/86 | HR 60 | Wt 145.0 lb

## 2013-07-05 DIAGNOSIS — I428 Other cardiomyopathies: Secondary | ICD-10-CM | POA: Diagnosis not present

## 2013-07-05 DIAGNOSIS — I10 Essential (primary) hypertension: Secondary | ICD-10-CM

## 2013-07-05 MED ORDER — HYDROCHLOROTHIAZIDE 12.5 MG PO CAPS: 12.5000 mg | ORAL_CAPSULE | Freq: Every day | ORAL | Status: DC

## 2013-07-05 NOTE — Assessment & Plan Note (Signed)
Possibly related to hypertension. She has tracked her blood pressure and is improving. I will add HCTZ 12.5 mg daily. BMET one week. Continue bisoprolol and lisinopril. Check potassium and renal function in one week. She will continue to monitor her blood pressure at home. Repeat echocardiogram now that blood pressure controlled. If ejection fraction less than 35% we will consider ICD.

## 2013-07-05 NOTE — Assessment & Plan Note (Signed)
Blood pressure mildly increased.add HCTZ 12.5 mg daily. Check potassium and renal function in one week.

## 2013-07-05 NOTE — Progress Notes (Signed)
   Pleasant female for fu of nonischemic cardiomyopathy. Cardiac catheterization in 2010 showed an ejection fraction of 15-20% and normal coronary arteries. Last echocardiogram in Feb 2014 showed an EF of 30-35, grade 1 diastolic dysfunction and moderate MR. She was last seen in May 2014. Lisinopril increased. Since then, she denies dyspnea on exertion, orthopnea, PND, pedal edema, palpitations, syncope or exertional chest pain.  Current Outpatient Prescriptions  Medication Sig Dispense Refill  . bisoprolol (ZEBETA) 5 MG tablet take 1 tablet by mouth once daily  30 tablet  2  . levothyroxine (SYNTHROID, LEVOTHROID) 25 MCG tablet Take 12.5 mcg by mouth daily before breakfast.      . lisinopril (PRINIVIL,ZESTRIL) 40 MG tablet Take 1 tablet (40 mg total) by mouth daily.  30 tablet  12  . Multiple Vitamins-Minerals (CENTRUM PO) Take by mouth daily.        . Omega-3 Fatty Acids (FISH OIL PO) Take 1 tablet by mouth daily.       No current facility-administered medications for this visit.     Past Medical History  Diagnosis Date  . Cardiomyopathy     non ischemic NL cos on cath 08/2009, EF to 15-20%, Her follow up  2D echo  in 10/2009 showed impoved EFo 35-40%  . CHF (congestive heart failure)   . Hypertension   . Anxiety   . History of lymphoma     Dr Drue Dun  . Hypothyroid     Past Surgical History  Procedure Laterality Date  . Tonsillectomy  1952  . Dilation and curettage of uterus    . Nasal polyps removed      age 79    History   Social History  . Marital Status: Married    Spouse Name: N/A    Number of Children: N/A  . Years of Education: N/A   Occupational History  . Not on file.   Social History Main Topics  . Smoking status: Never Smoker   . Smokeless tobacco: Never Used  . Alcohol Use: No  . Drug Use: No  . Sexual Activity: Not on file   Other Topics Concern  . Not on file   Social History Narrative   The patient is married ( husband Dashia Caldeira)  She just  recently celebrated     her 52nd anniversary.  She has five children.  There is no active     tobacco or alcohol use history.          Smoking Status:  never   Caffeine use/day:  None   Does Patient Exercise:  yes    ROS: no fevers or chills, productive cough, hemoptysis, dysphasia, odynophagia, melena, hematochezia, dysuria, hematuria, rash, seizure activity, orthopnea, PND, pedal edema, claudication. Remaining systems are negative.  Physical Exam: Well-developed well-nourished in no acute distress.  Skin is warm and dry.  HEENT is normal.  Neck is supple.  Chest is clear to auscultation with normal expansion.  Cardiovascular exam is regular rate and rhythm.  Abdominal exam nontender or distended. No masses palpated. Extremities show no edema. neuro grossly intact  ECG sinus rhythm at a rate of 60. Left ventricular hypertrophy. Cannot rule out prior septal infarct. Nonspecific ST changes.

## 2013-07-05 NOTE — Patient Instructions (Addendum)
Your physician recommends that you schedule a follow-up appointment in: 3 MONTHS WITH DR CRENSHAW IN [place]  START HCTZ 12.5 MG ONCE DAILY  Your physician recommends that you return for lab work IN ONE WEEK IN [place]  Your physician has requested that you have an echocardiogram. Echocardiography is a painless test that uses sound waves to create images of your heart. It provides your doctor with information about the size and shape of your heart and how well your heart's chambers and valves are working. This procedure takes approximately one hour. There are no restrictions for this procedure.

## 2013-07-12 DIAGNOSIS — I428 Other cardiomyopathies: Secondary | ICD-10-CM | POA: Diagnosis not present

## 2013-07-13 LAB — BASIC METABOLIC PANEL WITH GFR
GFR, Est African American: 60 mL/min
GFR, Est Non African American: 52 mL/min — ABNORMAL LOW
Glucose, Bld: 85 mg/dL (ref 70–99)
Potassium: 4.1 mEq/L (ref 3.5–5.3)
Sodium: 136 mEq/L (ref 135–145)

## 2013-07-19 ENCOUNTER — Ambulatory Visit (HOSPITAL_BASED_OUTPATIENT_CLINIC_OR_DEPARTMENT_OTHER)
Admission: RE | Admit: 2013-07-19 | Discharge: 2013-07-19 | Disposition: A | Discharge status: Home / Self Care | Payer: Medicare Other | Source: Ambulatory Visit | Attending: Cardiology | Admitting: Cardiology

## 2013-07-19 DIAGNOSIS — I059 Rheumatic mitral valve disease, unspecified: Secondary | ICD-10-CM | POA: Insufficient documentation

## 2013-07-19 DIAGNOSIS — I509 Heart failure, unspecified: Secondary | ICD-10-CM | POA: Insufficient documentation

## 2013-07-19 DIAGNOSIS — M069 Rheumatoid arthritis, unspecified: Secondary | ICD-10-CM | POA: Insufficient documentation

## 2013-07-19 DIAGNOSIS — I10 Essential (primary) hypertension: Secondary | ICD-10-CM | POA: Insufficient documentation

## 2013-07-19 DIAGNOSIS — E039 Hypothyroidism, unspecified: Secondary | ICD-10-CM | POA: Diagnosis not present

## 2013-07-19 DIAGNOSIS — I7300 Raynaud's syndrome without gangrene: Secondary | ICD-10-CM | POA: Diagnosis not present

## 2013-07-19 DIAGNOSIS — F411 Generalized anxiety disorder: Secondary | ICD-10-CM | POA: Insufficient documentation

## 2013-07-19 DIAGNOSIS — D696 Thrombocytopenia, unspecified: Secondary | ICD-10-CM | POA: Insufficient documentation

## 2013-07-19 DIAGNOSIS — I428 Other cardiomyopathies: Secondary | ICD-10-CM | POA: Diagnosis not present

## 2013-07-19 DIAGNOSIS — C8589 Other specified types of non-Hodgkin lymphoma, extranodal and solid organ sites: Secondary | ICD-10-CM | POA: Insufficient documentation

## 2013-07-19 NOTE — Progress Notes (Signed)
  Echocardiogram 2D Echocardiogram has been performed.  Horris Speros 07/19/2013, 10:12 AM

## 2013-08-31 ENCOUNTER — Encounter: Payer: Self-pay | Admitting: Cardiology

## 2013-08-31 ENCOUNTER — Ambulatory Visit (INDEPENDENT_AMBULATORY_CARE_PROVIDER_SITE_OTHER): Payer: Medicare Other | Admitting: Cardiology

## 2013-08-31 VITALS — BP 132/70 | HR 60 | Ht 62.0 in | Wt 144.0 lb

## 2013-08-31 DIAGNOSIS — I428 Other cardiomyopathies: Secondary | ICD-10-CM

## 2013-08-31 DIAGNOSIS — I10 Essential (primary) hypertension: Secondary | ICD-10-CM | POA: Diagnosis not present

## 2013-08-31 NOTE — Assessment & Plan Note (Signed)
LV function has improved on medications. Question hypertensive-induced cardiomyopathy. Continue beta blocker and ACE inhibitor.

## 2013-08-31 NOTE — Assessment & Plan Note (Signed)
Patient is complaining of orthostatic symptoms with mildly reduced blood pressure. Discontinue HCTZ to see if this improves.

## 2013-08-31 NOTE — Progress Notes (Signed)
      HPI: Pleasant female for fu of nonischemic cardiomyopathy. Cardiac catheterization in 2010 showed an ejection fraction of 15-20% and normal coronary arteries. Last echocardiogram in September 2014 showed improvement of LV function. Her ejection fraction was 45-50% and there was mild mitral regurgitation. She was last seen in August 2014. We added HCTZ. Since then, she has noticed some dizziness with standing. Her systolic blood pressure is between 110 and 120. She denies dyspnea, chest pain or syncope.   Current Outpatient Prescriptions  Medication Sig Dispense Refill  . bisoprolol (ZEBETA) 5 MG tablet take 1 tablet by mouth once daily  30 tablet  2  . hydrochlorothiazide (MICROZIDE) 12.5 MG capsule Take 1 capsule (12.5 mg total) by mouth daily.  90 capsule  3  . levothyroxine (SYNTHROID, LEVOTHROID) 25 MCG tablet Take 12.5 mcg by mouth daily before breakfast.      . lisinopril (PRINIVIL,ZESTRIL) 40 MG tablet Take 1 tablet (40 mg total) by mouth daily.  30 tablet  12  . Multiple Vitamins-Minerals (CENTRUM PO) Take by mouth daily.        . Omega-3 Fatty Acids (FISH OIL PO) Take 1 tablet by mouth daily.       No current facility-administered medications for this visit.     Past Medical History  Diagnosis Date  . Cardiomyopathy     non ischemic NL cos on cath 08/2009, EF to 15-20%, Her follow up  2D echo  in 10/2009 showed impoved EFo 35-40%  . CHF (congestive heart failure)   . Hypertension   . Anxiety   . History of lymphoma     Dr Drue Dun  . Hypothyroid     Past Surgical History  Procedure Laterality Date  . Tonsillectomy  1952  . Dilation and curettage of uterus    . Nasal polyps removed      age 71    History   Social History  . Marital Status: Married    Spouse Name: N/A    Number of Children: N/A  . Years of Education: N/A   Occupational History  . Not on file.   Social History Main Topics  . Smoking status: Never Smoker   . Smokeless tobacco: Never  Used  . Alcohol Use: No  . Drug Use: No  . Sexual Activity: Not on file   Other Topics Concern  . Not on file   Social History Narrative   The patient is married ( husband Alexiana Laverdure)  She just recently celebrated     her 52nd anniversary.  She has five children.  There is no active     tobacco or alcohol use history.          Smoking Status:  never   Caffeine use/day:  None   Does Patient Exercise:  yes    ROS: no fevers or chills, productive cough, hemoptysis, dysphasia, odynophagia, melena, hematochezia, dysuria, hematuria, rash, seizure activity, orthopnea, PND, pedal edema, claudication. Remaining systems are negative.  Physical Exam: Well-developed well-nourished in no acute distress.  Skin is warm and dry.  HEENT is normal.  Neck is supple.  Chest is clear to auscultation with normal expansion.  Cardiovascular exam is irregular Abdominal exam nontender or distended. No masses palpated. Extremities show no edema. neuro grossly intact  ECG - sinus rhythm, left ventricular hypertrophy, left anterior fascicular block, prior septal infarct cannot be excluded.

## 2013-08-31 NOTE — Patient Instructions (Signed)
Your physician wants you to follow-up in: 6 MONTHS WITH DR Jens Som You will receive a reminder letter in the mail two months in advance. If you don't receive a letter, please call our office to schedule the follow-up appointment.   STOP HCTZ

## 2013-09-15 ENCOUNTER — Other Ambulatory Visit: Payer: Self-pay | Admitting: Family Medicine

## 2013-09-27 ENCOUNTER — Ambulatory Visit: Payer: Medicare Other | Admitting: Cardiology

## 2013-12-15 ENCOUNTER — Other Ambulatory Visit: Payer: Self-pay | Admitting: Family Medicine

## 2013-12-15 NOTE — Telephone Encounter (Signed)
Rx request to pharmacy, 30-day,**PATIENT DUE FOR FOLLOW-UP OFFICE VISIT** /SLS

## 2014-01-18 ENCOUNTER — Other Ambulatory Visit: Payer: Self-pay | Admitting: Family Medicine

## 2014-01-18 NOTE — Telephone Encounter (Signed)
Please inform pt that we sent 1 more 30 day supply of medication but there needs to be an appt made for additional refills. Dr B wanted pt to return by 08-12-13

## 2014-01-22 NOTE — Telephone Encounter (Signed)
Informed patient of medication refill and she states that she does not want to schedule appointment right now, she will call back to schedule

## 2014-01-29 ENCOUNTER — Ambulatory Visit (INDEPENDENT_AMBULATORY_CARE_PROVIDER_SITE_OTHER): Payer: Medicare Other | Admitting: Cardiology

## 2014-01-29 ENCOUNTER — Encounter: Payer: Self-pay | Admitting: Cardiology

## 2014-01-29 VITALS — BP 150/78 | HR 60 | Ht 62.0 in | Wt 144.0 lb

## 2014-01-29 DIAGNOSIS — I428 Other cardiomyopathies: Secondary | ICD-10-CM | POA: Diagnosis not present

## 2014-01-29 DIAGNOSIS — I10 Essential (primary) hypertension: Secondary | ICD-10-CM | POA: Diagnosis not present

## 2014-01-29 MED ORDER — BISOPROLOL FUMARATE 5 MG PO TABS: 5.0000 mg | ORAL_TABLET | Freq: Every day | ORAL | Status: DC

## 2014-01-29 NOTE — Progress Notes (Signed)
      HPI: Pleasant female for fu of nonischemic cardiomyopathy. Cardiac catheterization in 2010 showed an ejection fraction of 15-20% and normal coronary arteries. Last echocardiogram in September 2014 showed improvement of LV function. Her ejection fraction was 45-50% and there was mild mitral regurgitation. She was last seen in Oct 2014. Since then, the patient denies any dyspnea on exertion, orthopnea, PND, pedal edema, palpitations, syncope or chest pain.   Current Outpatient Prescriptions  Medication Sig Dispense Refill  . bisoprolol (ZEBETA) 5 MG tablet take 1 tablet by mouth once daily  30 tablet  0  . levothyroxine (SYNTHROID, LEVOTHROID) 25 MCG tablet Take 12.5 mcg by mouth daily before breakfast.      . lisinopril (PRINIVIL,ZESTRIL) 40 MG tablet Take 1 tablet (40 mg total) by mouth daily.  30 tablet  12  . Multiple Vitamins-Minerals (CENTRUM PO) Take by mouth daily.        . Omega-3 Fatty Acids (FISH OIL PO) Take 1 tablet by mouth daily.       No current facility-administered medications for this visit.     Past Medical History  Diagnosis Date  . Cardiomyopathy     non ischemic NL cos on cath 08/2009, EF to 15-20%, Her follow up  2D echo  in 10/2009 showed impoved EFo 35-40%  . CHF (congestive heart failure)   . Hypertension   . Anxiety   . History of lymphoma     Dr Martha Clan  . Hypothyroid     Past Surgical History  Procedure Laterality Date  . Tonsillectomy  1952  . Dilation and curettage of uterus    . Nasal polyps removed      age 34    History   Social History  . Marital Status: Married    Spouse Name: N/A    Number of Children: N/A  . Years of Education: N/A   Occupational History  . Not on file.   Social History Main Topics  . Smoking status: Never Smoker   . Smokeless tobacco: Never Used  . Alcohol Use: No  . Drug Use: No  . Sexual Activity: Not on file   Other Topics Concern  . Not on file   Social History Narrative   The patient is  married ( husband Mailani Degroote)  She just recently celebrated     her 52nd anniversary.  She has five children.  There is no active     tobacco or alcohol use history.          Smoking Status:  never   Caffeine use/day:  None   Does Patient Exercise:  yes    ROS: no fevers or chills, productive cough, hemoptysis, dysphasia, odynophagia, melena, hematochezia, dysuria, hematuria, rash, seizure activity, orthopnea, PND, pedal edema, claudication. Remaining systems are negative.  Physical Exam: Well-developed well-nourished in no acute distress.  Skin is warm and dry.  HEENT is normal.  Neck is supple.  Chest is clear to auscultation with normal expansion.  Cardiovascular exam is regular rate and rhythm.  Abdominal exam nontender or distended. No masses palpated. Extremities show no edema. neuro grossly intact  ECG sinus rhythm at a rate of 60. Left ventricular hypertrophy. Left axis deviation.

## 2014-01-29 NOTE — Assessment & Plan Note (Signed)
Blood pressure is mildly elevated today but she follows this closely at home and it is typically controlled. Continue present medications.

## 2014-01-29 NOTE — Patient Instructions (Signed)
Your physician wants you to follow-up in: ONE YEAR WITH DR CRENSHAW You will receive a reminder letter in the mail two months in advance. If you don't receive a letter, please call our office to schedule the follow-up appointment.  

## 2014-01-29 NOTE — Assessment & Plan Note (Signed)
Continue beta blocker and ACE inhibitor. Her cardiomyopathy may have been related to hypertension. I am hesitant to increase medications as she had orthostatic symptoms with low-dose diuretic previously.

## 2014-04-16 ENCOUNTER — Other Ambulatory Visit: Payer: Self-pay | Admitting: Family Medicine

## 2014-04-18 ENCOUNTER — Other Ambulatory Visit: Payer: Self-pay | Admitting: *Deleted

## 2014-04-18 DIAGNOSIS — C8589 Other specified types of non-Hodgkin lymphoma, extranodal and solid organ sites: Secondary | ICD-10-CM

## 2014-04-19 ENCOUNTER — Encounter: Payer: Self-pay | Admitting: Hematology & Oncology

## 2014-04-19 ENCOUNTER — Other Ambulatory Visit (HOSPITAL_BASED_OUTPATIENT_CLINIC_OR_DEPARTMENT_OTHER): Payer: Medicare Other | Admitting: Lab

## 2014-04-19 ENCOUNTER — Ambulatory Visit (HOSPITAL_BASED_OUTPATIENT_CLINIC_OR_DEPARTMENT_OTHER): Payer: Medicare Other | Admitting: Hematology & Oncology

## 2014-04-19 VITALS — BP 164/50 | HR 61 | Temp 98.1°F | Resp 14 | Ht 62.0 in | Wt 139.0 lb

## 2014-04-19 DIAGNOSIS — C8589 Other specified types of non-Hodgkin lymphoma, extranodal and solid organ sites: Secondary | ICD-10-CM | POA: Diagnosis not present

## 2014-04-19 LAB — LACTATE DEHYDROGENASE: LDH: 199 U/L (ref 94–250)

## 2014-04-19 LAB — CBC WITH DIFFERENTIAL (CANCER CENTER ONLY)
BASO#: 0 10*3/uL (ref 0.0–0.2)
BASO%: 0.3 % (ref 0.0–2.0)
EOS%: 2.7 % (ref 0.0–7.0)
Eosinophils Absolute: 0.1 10*3/uL (ref 0.0–0.5)
HEMATOCRIT: 40.9 % (ref 34.8–46.6)
HEMOGLOBIN: 13.5 g/dL (ref 11.6–15.9)
LYMPH#: 1.2 10*3/uL (ref 0.9–3.3)
LYMPH%: 41.8 % (ref 14.0–48.0)
MCH: 29.5 pg (ref 26.0–34.0)
MCHC: 33 g/dL (ref 32.0–36.0)
MCV: 89 fL (ref 81–101)
MONO#: 0.5 10*3/uL (ref 0.1–0.9)
MONO%: 16.8 % — ABNORMAL HIGH (ref 0.0–13.0)
NEUT%: 38.4 % — AB (ref 39.6–80.0)
NEUTROS ABS: 1.1 10*3/uL — AB (ref 1.5–6.5)
Platelets: 127 10*3/uL — ABNORMAL LOW (ref 145–400)
RBC: 4.58 10*6/uL (ref 3.70–5.32)
RDW: 12.3 % (ref 11.1–15.7)
WBC: 2.9 10*3/uL — ABNORMAL LOW (ref 3.9–10.0)

## 2014-04-19 LAB — COMPREHENSIVE METABOLIC PANEL
ALK PHOS: 139 U/L — AB (ref 39–117)
ALT: 34 U/L (ref 0–35)
AST: 30 U/L (ref 0–37)
Albumin: 3.8 g/dL (ref 3.5–5.2)
BUN: 12 mg/dL (ref 6–23)
CALCIUM: 9.6 mg/dL (ref 8.4–10.5)
CHLORIDE: 104 meq/L (ref 96–112)
CO2: 30 mEq/L (ref 19–32)
Creatinine, Ser: 0.97 mg/dL (ref 0.50–1.10)
Glucose, Bld: 104 mg/dL — ABNORMAL HIGH (ref 70–99)
Potassium: 4.1 mEq/L (ref 3.5–5.3)
SODIUM: 139 meq/L (ref 135–145)
TOTAL PROTEIN: 7.9 g/dL (ref 6.0–8.3)
Total Bilirubin: 0.5 mg/dL (ref 0.2–1.2)

## 2014-04-19 NOTE — Progress Notes (Signed)
Hematology and Oncology Follow Up Visit  IVY PURYEAR [id number] 1934/07/14 80 y.o. 04/19/2014   Principle Diagnosis:   Diffuse large cell non-Hodgkin's lymphoma-remission  Current Therapy:    Observation     Interim History:  Ms.  Marshman is is back for followup. We see her yearly. She's doing quite well. She had no problems since her last saw her. She still doing the [employer]. She probably has 1/80 medals so far.  She's had no problems with fatigue or weakness. Had no cough. She's had no rashes present no fever. Said no change in bowel or bladder habits.  As always, her faith has been incredibly strong.  She does have hypothyroidism and is on Synthroid.  Medications: Current outpatient prescriptions:bisoprolol (ZEBETA) 5 MG tablet, Take 1 tablet (5 mg total) by mouth daily., Disp: 30 tablet, Rfl: 12;  levothyroxine (SYNTHROID, LEVOTHROID) 25 MCG tablet, take 1/2 tablet by mouth once daily, Disp: 30 tablet, Rfl: 5;  lisinopril (PRINIVIL,ZESTRIL) 40 MG tablet, Take 1 tablet (40 mg total) by mouth daily., Disp: 30 tablet, Rfl: 12;  Multiple Vitamins-Minerals (CENTRUM PO), Take by mouth daily.  , Disp: , Rfl:  Omega-3 Fatty Acids (FISH OIL PO), Take 1 tablet by mouth daily., Disp: , Rfl:   Allergies:  Allergies  Allergen Reactions  . Iohexol      Desc: hives,itiching     Past Medical History, Surgical history, Social history, and Family History were reviewed and updated.  Review of Systems: As above  Physical Exam:  height is 5\' 2"  (1.575 m) and weight is 139 lb (63.05 kg). Her oral temperature is 98.1 F (36.7 C). Her blood pressure is 164/50 and her pulse is 61. Her respiration is 14.   Well-developed and well-nourished African American female. Head and neck exam shows no ocular or oral lesions. There is no adenopathy in the neck. Lungs are clear. Cardiac exam regular rate rhythm with no murmurs rubs or bruits. Abdomen is soft. She's good bowel sounds. There is no fluid  wave. There is no palpable liver or spleen tip. Neck exam no tenderness over the spine ribs or hips. Skin exam no rashes count was or petechia. Extremities shows good range of motion of her joints. She has good muscle strength. She has good pulses. Neurological exam is nonfocal.  Lab Results  Component Value Date   WBC 2.9* 04/19/2014   HGB 13.5 04/19/2014   HCT 40.9 04/19/2014   MCV 89 04/19/2014   PLT 127* 04/19/2014     Chemistry      Component Value Date/Time   NA 136 07/12/2013 1430   K 4.1 07/12/2013 1430   CL 99 07/12/2013 1430   CO2 33* 07/12/2013 1430   BUN 26* 07/12/2013 1430   CREATININE 1.04 07/12/2013 1430   CREATININE 0.87 03/23/2013 0824      Component Value Date/Time   CALCIUM 10.1 07/12/2013 1430   ALKPHOS 104 03/23/2013 0824   AST 26 03/23/2013 0824   ALT 23 03/23/2013 0824   BILITOT 0.4 03/23/2013 0824         Impression and Plan: Ms. Deol is a 80 year old aftermath of female. Has a history of diffuse large cell non-Hodgkin lymphoma. She is now about 14 years from therapy. She is cured from my point of view.  I have noted that her white cells and plans are down below but.  I did look at her blood smear. I did not see anything that looked suspicious. She had good maturation of  her white blood cells and platelets. Platelets were well granulated. There were no nucleated red cells. She is asymptomatic.  We will still plan for one-year followup. If there are any problems, then we can certainly see her sooner.   Volanda Napoleon, MD 6/4/20159:10 AM

## 2014-05-14 ENCOUNTER — Other Ambulatory Visit: Payer: Self-pay

## 2014-05-14 DIAGNOSIS — I10 Essential (primary) hypertension: Secondary | ICD-10-CM

## 2014-05-14 MED ORDER — LISINOPRIL 40 MG PO TABS: 40.0000 mg | ORAL_TABLET | Freq: Every day | ORAL | Status: DC

## 2014-06-07 ENCOUNTER — Encounter: Payer: Self-pay | Admitting: Family Medicine

## 2014-06-07 ENCOUNTER — Ambulatory Visit (INDEPENDENT_AMBULATORY_CARE_PROVIDER_SITE_OTHER): Payer: Medicare Other | Admitting: Family Medicine

## 2014-06-07 VITALS — BP 130/80 | HR 70 | Temp 98.2°F | Ht 62.0 in | Wt 138.1 lb

## 2014-06-07 DIAGNOSIS — R7989 Other specified abnormal findings of blood chemistry: Secondary | ICD-10-CM | POA: Diagnosis not present

## 2014-06-07 DIAGNOSIS — D696 Thrombocytopenia, unspecified: Secondary | ICD-10-CM

## 2014-06-07 DIAGNOSIS — E039 Hypothyroidism, unspecified: Secondary | ICD-10-CM

## 2014-06-07 DIAGNOSIS — E785 Hyperlipidemia, unspecified: Secondary | ICD-10-CM | POA: Diagnosis not present

## 2014-06-07 DIAGNOSIS — R739 Hyperglycemia, unspecified: Secondary | ICD-10-CM

## 2014-06-07 DIAGNOSIS — I10 Essential (primary) hypertension: Secondary | ICD-10-CM

## 2014-06-07 DIAGNOSIS — R7309 Other abnormal glucose: Secondary | ICD-10-CM

## 2014-06-07 DIAGNOSIS — I509 Heart failure, unspecified: Secondary | ICD-10-CM

## 2014-06-07 LAB — CBC
HEMATOCRIT: 38.7 % (ref 36.0–46.0)
Hemoglobin: 13.2 g/dL (ref 12.0–15.0)
MCH: 28.8 pg (ref 26.0–34.0)
MCHC: 34.1 g/dL (ref 30.0–36.0)
MCV: 84.5 fL (ref 78.0–100.0)
Platelets: 134 10*3/uL — ABNORMAL LOW (ref 150–400)
RBC: 4.58 MIL/uL (ref 3.87–5.11)
RDW: 12.7 % (ref 11.5–15.5)
WBC: 3.2 10*3/uL — ABNORMAL LOW (ref 4.0–10.5)

## 2014-06-07 LAB — RENAL FUNCTION PANEL
Albumin: 3.6 g/dL (ref 3.5–5.2)
BUN: 9 mg/dL (ref 6–23)
CALCIUM: 9.3 mg/dL (ref 8.4–10.5)
CO2: 28 mEq/L (ref 19–32)
Chloride: 102 mEq/L (ref 96–112)
Creat: 0.83 mg/dL (ref 0.50–1.10)
GLUCOSE: 101 mg/dL — AB (ref 70–99)
PHOSPHORUS: 2.3 mg/dL (ref 2.3–4.6)
Potassium: 4.6 mEq/L (ref 3.5–5.3)
Sodium: 137 mEq/L (ref 135–145)

## 2014-06-07 LAB — LIPID PANEL
CHOLESTEROL: 151 mg/dL (ref 0–200)
HDL: 48 mg/dL (ref >39)
LDL Cholesterol: 88 mg/dL (ref 0–99)
TRIGLYCERIDES: 77 mg/dL (ref <150)
Total CHOL/HDL Ratio: 3.1 Ratio
VLDL: 15 mg/dL (ref 0–40)

## 2014-06-07 LAB — HEPATIC FUNCTION PANEL
ALT: 37 U/L — AB (ref 0–35)
AST: 36 U/L (ref 0–37)
Albumin: 3.6 g/dL (ref 3.5–5.2)
Alkaline Phosphatase: 139 U/L — ABNORMAL HIGH (ref 39–117)
Bilirubin, Direct: 0.1 mg/dL (ref 0.0–0.3)
Indirect Bilirubin: 0.6 mg/dL (ref 0.2–1.2)
TOTAL PROTEIN: 8.1 g/dL (ref 6.0–8.3)
Total Bilirubin: 0.7 mg/dL (ref 0.2–1.2)

## 2014-06-07 LAB — HEMOGLOBIN A1C
Hgb A1c MFr Bld: 5.9 % — ABNORMAL HIGH (ref <5.7)
Mean Plasma Glucose: 123 mg/dL — ABNORMAL HIGH (ref <117)

## 2014-06-07 LAB — TSH: TSH: 3.583 u[IU]/mL (ref 0.350–4.500)

## 2014-06-07 MED ORDER — LISINOPRIL 10 MG PO TABS: 10.0000 mg | ORAL_TABLET | Freq: Every day | ORAL | Status: DC

## 2014-06-07 NOTE — Progress Notes (Signed)
Pre visit review using our clinic review tool, if applicable. No additional management support is needed unless otherwise documented below in the visit note. 

## 2014-06-07 NOTE — Patient Instructions (Signed)

## 2014-06-08 ENCOUNTER — Telehealth: Payer: Self-pay | Admitting: Family Medicine

## 2014-06-08 NOTE — Telephone Encounter (Signed)
Relevant patient education mailed to patient.  

## 2014-06-10 ENCOUNTER — Encounter: Payer: Self-pay | Admitting: Family Medicine

## 2014-06-10 NOTE — Assessment & Plan Note (Signed)
Well controlled, no changes to meds. Encouraged heart healthy diet such as the DASH diet and exercise as tolerated.  °

## 2014-06-10 NOTE — Assessment & Plan Note (Signed)
hgba1c acceptable, minimize simple carbs. Increase exercise as tolerated.  

## 2014-06-10 NOTE — Assessment & Plan Note (Signed)
Mild, stable.  

## 2014-06-10 NOTE — Assessment & Plan Note (Signed)
Follows with cardiology, asymptomatic, exercises regularly, continue the same, minimize sodium

## 2014-06-10 NOTE — Progress Notes (Signed)
Patient ID: Crystal [name], female   DOB: 07-20-1934, 80 y.o.   MRN: [id number] Crystal [name] [id number] 28-Feb-1934 06/10/2014      Progress Note-Follow Up  Subjective  Chief Complaint  No chief complaint on file.   HPI  Patient is a 80 year old female in today for routine medical care. In today for followup and refill on medications. Feels well. Exercises regularly including in the [employer]. No recent illness. Denies CP/palp/SOB/HA/congestion/fevers/GI or GU c/o. Taking meds as prescribed  Past Medical History  Diagnosis Date  . Cardiomyopathy     non ischemic NL cos on cath 08/2009, EF to 15-20%, Her follow up  2D echo  in 10/2009 showed impoved EFo 35-40%  . CHF (congestive heart failure)   . Hypertension   . Anxiety   . History of lymphoma     Dr Martha Clan  . Hypothyroid     Past Surgical History  Procedure Laterality Date  . Tonsillectomy  1952  . Dilation and curettage of uterus    . Nasal polyps removed      age 45    Family History  Problem Relation Age of Onset  . Diabetes Mother     deceased secondary to diabetes  . Pneumonia Father     died age 50 due to complications of pneumonia  . Colon polyps Father   . Sarcoidosis Daughter   . Sarcoidosis Sister     may have had  . Other      no obvious premature cardiovascular disease or familial cardiomyopathy is noted  . Colon cancer Brother 54    History   Social History  . Marital Status: Married    Spouse Name: N/A    Number of Children: N/A  . Years of Education: N/A   Occupational History  . Not on file.   Social History Main Topics  . Smoking status: Never Smoker   . Smokeless tobacco: Never Used     Comment: never used tobacco  . Alcohol Use: No  . Drug Use: No  . Sexual Activity: Not on file   Other Topics Concern  . Not on file   Social History Narrative   The patient is married ( husband Sanvi Ehler)  She just recently celebrated     her 52nd anniversary.  She has five  children.  There is no active     tobacco or alcohol use history.          Smoking Status:  never   Caffeine use/day:  None   Does Patient Exercise:  yes    Current Outpatient Prescriptions on File Prior to Visit  Medication Sig Dispense Refill  . bisoprolol (ZEBETA) 5 MG tablet Take 1 tablet (5 mg total) by mouth daily.  30 tablet  12  . levothyroxine (SYNTHROID, LEVOTHROID) 25 MCG tablet take 1/2 tablet by mouth once daily  30 tablet  5  . Multiple Vitamins-Minerals (CENTRUM PO) Take by mouth daily.        . Omega-3 Fatty Acids (FISH OIL PO) Take 1 tablet by mouth daily.       No current facility-administered medications on file prior to visit.    Allergies  Allergen Reactions  . Iohexol      Desc: hives,itiching     Review of Systems  Review of Systems  Constitutional: Negative for fever and malaise/fatigue.  HENT: Negative for congestion.   Eyes: Negative for discharge.  Respiratory: Negative for shortness of breath.  Cardiovascular: Negative for chest pain, palpitations and leg swelling.  Gastrointestinal: Negative for nausea, abdominal pain and diarrhea.  Genitourinary: Negative for dysuria.  Musculoskeletal: Negative for falls.  Skin: Negative for rash.  Neurological: Negative for loss of consciousness and headaches.  Endo/Heme/Allergies: Negative for polydipsia.  Psychiatric/Behavioral: Negative for depression and suicidal ideas. The patient is not nervous/anxious and does not have insomnia.     Objective  BP 130/80  Pulse 70  Temp(Src) 98.2 F (36.8 C) (Oral)  Ht 5\' 2"  (1.575 m)  Wt 138 lb 1.9 oz (62.651 kg)  BMI 25.26 kg/m2  SpO2 98%  Physical Exam  Physical Exam  Constitutional: She is oriented to person, place, and time and well-developed, well-nourished, and in no distress. No distress.  HENT:  Head: Normocephalic and atraumatic.  Eyes: Conjunctivae are normal.  Neck: Neck supple. No thyromegaly present.  Cardiovascular: Normal rate, regular  rhythm and normal heart sounds.   No murmur heard. Pulmonary/Chest: Effort normal and breath sounds normal. She has no wheezes.  Abdominal: She exhibits no distension and no mass.  Musculoskeletal: She exhibits no edema.  Lymphadenopathy:    She has no cervical adenopathy.  Neurological: She is alert and oriented to person, place, and time.  Skin: Skin is warm and dry. No rash noted. She is not diaphoretic.  Psychiatric: Memory, affect and judgment normal.    Lab Results  Component Value Date   TSH 3.583 06/07/2014   Lab Results  Component Value Date   WBC 3.2* 06/07/2014   HGB 13.2 06/07/2014   HCT 38.7 06/07/2014   MCV 84.5 06/07/2014   PLT 134* 06/07/2014   Lab Results  Component Value Date   CREATININE 0.83 06/07/2014   BUN 9 06/07/2014   NA 137 06/07/2014   K 4.6 06/07/2014   CL 102 06/07/2014   CO2 28 06/07/2014   Lab Results  Component Value Date   ALT 37* 06/07/2014   AST 36 06/07/2014   ALKPHOS 139* 06/07/2014   BILITOT 0.7 06/07/2014   Lab Results  Component Value Date   CHOL 151 06/07/2014   Lab Results  Component Value Date   HDL 48 06/07/2014   Lab Results  Component Value Date   LDLCALC 88 06/07/2014   Lab Results  Component Value Date   TRIG 77 06/07/2014   Lab Results  Component Value Date   CHOLHDL 3.1 06/07/2014     Assessment & Plan  HYPERTENSION Well controlled, no changes to meds. Encouraged heart healthy diet such as the DASH diet and exercise as tolerated.   HYPOTHYROIDISM On Levothyroxine, continue to monitor  Hyperglycemia hgba1c acceptable, minimize simple carbs. Increase exercise as tolerated.  THROMBOCYTOPENIA Mild, stable  CONGESTIVE HEART FAILURE Follows with cardiology, asymptomatic, exercises regularly, continue the same, minimize sodium

## 2014-06-10 NOTE — Assessment & Plan Note (Signed)
On Levothyroxine, continue to monitor 

## 2014-12-07 ENCOUNTER — Other Ambulatory Visit: Payer: Self-pay | Admitting: Family Medicine

## 2014-12-14 ENCOUNTER — Encounter: Payer: Self-pay | Admitting: Family Medicine

## 2014-12-14 ENCOUNTER — Ambulatory Visit (INDEPENDENT_AMBULATORY_CARE_PROVIDER_SITE_OTHER): Payer: Medicare Other | Admitting: Family Medicine

## 2014-12-14 VITALS — BP 170/81 | HR 71 | Temp 97.7°F | Ht 63.0 in | Wt 140.0 lb

## 2014-12-14 DIAGNOSIS — R739 Hyperglycemia, unspecified: Secondary | ICD-10-CM | POA: Diagnosis not present

## 2014-12-14 DIAGNOSIS — Z0000 Encounter for general adult medical examination without abnormal findings: Secondary | ICD-10-CM | POA: Diagnosis not present

## 2014-12-14 DIAGNOSIS — E782 Mixed hyperlipidemia: Secondary | ICD-10-CM

## 2014-12-14 DIAGNOSIS — D696 Thrombocytopenia, unspecified: Secondary | ICD-10-CM

## 2014-12-14 DIAGNOSIS — E039 Hypothyroidism, unspecified: Secondary | ICD-10-CM

## 2014-12-14 DIAGNOSIS — I10 Essential (primary) hypertension: Secondary | ICD-10-CM

## 2014-12-14 DIAGNOSIS — H547 Unspecified visual loss: Secondary | ICD-10-CM

## 2014-12-14 HISTORY — DX: Encounter for general adult medical examination without abnormal findings: Z00.00

## 2014-12-14 LAB — CBC
HCT: 41.2 % (ref 36.0–46.0)
HEMOGLOBIN: 13.9 g/dL (ref 12.0–15.0)
MCHC: 33.7 g/dL (ref 30.0–36.0)
MCV: 87 fl (ref 78.0–100.0)
Platelets: 134 10*3/uL — ABNORMAL LOW (ref 150.0–400.0)
RBC: 4.74 Mil/uL (ref 3.87–5.11)
RDW: 13 % (ref 11.5–15.5)
WBC: 3.1 10*3/uL — AB (ref 4.0–10.5)

## 2014-12-14 LAB — COMPREHENSIVE METABOLIC PANEL
ALT: 33 U/L (ref 0–35)
AST: 30 U/L (ref 0–37)
Albumin: 3.9 g/dL (ref 3.5–5.2)
Alkaline Phosphatase: 143 U/L — ABNORMAL HIGH (ref 39–117)
BUN: 16 mg/dL (ref 6–23)
CO2: 30 meq/L (ref 19–32)
Calcium: 10 mg/dL (ref 8.4–10.5)
Chloride: 102 mEq/L (ref 96–112)
Creatinine, Ser: 0.99 mg/dL (ref 0.40–1.20)
GFR: 69.59 mL/min (ref >60.00)
Glucose, Bld: 99 mg/dL (ref 70–99)
Potassium: 3.9 mEq/L (ref 3.5–5.1)
Sodium: 138 mEq/L (ref 135–145)
TOTAL PROTEIN: 8.8 g/dL — AB (ref 6.0–8.3)
Total Bilirubin: 0.6 mg/dL (ref 0.2–1.2)

## 2014-12-14 LAB — MAGNESIUM: MAGNESIUM: 1.7 mg/dL (ref 1.5–2.5)

## 2014-12-14 LAB — LIPID PANEL
Cholesterol: 198 mg/dL (ref 0–200)
HDL: 58.9 mg/dL (ref >39.00)
LDL CALC: 121 mg/dL — AB (ref 0–99)
NonHDL: 139.1
Total CHOL/HDL Ratio: 3
Triglycerides: 93 mg/dL (ref 0.0–149.0)
VLDL: 18.6 mg/dL (ref 0.0–40.0)

## 2014-12-14 LAB — TSH: TSH: 4.45 u[IU]/mL (ref 0.35–4.50)

## 2014-12-14 LAB — HEMOGLOBIN A1C: HEMOGLOBIN A1C: 5.8 % (ref 4.6–6.5)

## 2014-12-14 MED ORDER — LISINOPRIL 10 MG PO TABS: 10.0000 mg | ORAL_TABLET | Freq: Two times a day (BID) | ORAL | Status: DC

## 2014-12-14 NOTE — Progress Notes (Signed)
Pre visit review using our clinic review tool, if applicable. No additional management support is needed unless otherwise documented below in the visit note. 

## 2014-12-14 NOTE — Progress Notes (Signed)
Crystal [name]  [id number] 11-24-1933 12/14/2014      Progress Note-Follow Up  Subjective  Chief Complaint  Chief Complaint  Patient presents with  . Medicare Wellness    HPI  Patient is a 81 y.o. female in today for routine medical care. Patient in for annual exam. No recent illness. Notes home BP of 120 to 130 over 70-80. Denies CP/palp/SOB/HA/congestion/fevers/GI or GU c/o. Taking meds as prescribed. Is doing well at home. No trouble with falls or depression. Managing ADLs well.  Past Medical History  Diagnosis Date  . Cardiomyopathy     non ischemic NL cos on cath 08/2009, EF to 15-20%, Her follow up  2D echo  in 10/2009 showed impoved EFo 35-40%  . CHF (congestive heart failure)   . Hypertension   . Anxiety   . History of lymphoma     Dr Martha Clan  . Hypothyroid     Past Surgical History  Procedure Laterality Date  . Tonsillectomy  1952  . Dilation and curettage of uterus    . Nasal polyps removed      age 32    Family History  Problem Relation Age of Onset  . Diabetes Mother     deceased secondary to diabetes  . Pneumonia Father     died age 23 due to complications of pneumonia  . Colon polyps Father   . Sarcoidosis Daughter   . Sarcoidosis Sister     may have had  . Other      no obvious premature cardiovascular disease or familial cardiomyopathy is noted  . Colon cancer Brother 4    History   Social History  . Marital Status: Married    Spouse Name: N/A    Number of Children: N/A  . Years of Education: N/A   Occupational History  . Not on file.   Social History Main Topics  . Smoking status: Never Smoker   . Smokeless tobacco: Never Used     Comment: never used tobacco  . Alcohol Use: No  . Drug Use: No  . Sexual Activity: Not on file   Other Topics Concern  . Not on file   Social History Narrative   The patient is married ( husband Mozel Burdett)  She just recently celebrated     her 52nd anniversary.  She has five children.  There  is no active     tobacco or alcohol use history.          Smoking Status:  never   Caffeine use/day:  None   Does Patient Exercise:  yes    Current Outpatient Prescriptions on File Prior to Visit  Medication Sig Dispense Refill  . bisoprolol (ZEBETA) 5 MG tablet Take 1 tablet (5 mg total) by mouth daily. 30 tablet 12  . levothyroxine (SYNTHROID, LEVOTHROID) 25 MCG tablet take 1/2 tablet by mouth once daily 30 tablet 5  . lisinopril (PRINIVIL,ZESTRIL) 10 MG tablet take 1 tablet by mouth once daily 30 tablet 5  . Multiple Vitamins-Minerals (CENTRUM PO) Take by mouth daily.      . Omega-3 Fatty Acids (FISH OIL PO) Take 1 tablet by mouth daily.     No current facility-administered medications on file prior to visit.    Allergies  Allergen Reactions  . Iohexol      Desc: hives,itiching     Review of Systems  Review of Systems  Constitutional: Negative for fever, chills and malaise/fatigue.  HENT: Negative for congestion, hearing loss and  nosebleeds.   Eyes: Negative for discharge.  Respiratory: Negative for cough, sputum production, shortness of breath and wheezing.   Cardiovascular: Negative for chest pain, palpitations and leg swelling.  Gastrointestinal: Negative for heartburn, nausea, vomiting, abdominal pain, diarrhea, constipation and blood in stool.  Genitourinary: Negative for dysuria, urgency, frequency and hematuria.  Musculoskeletal: Negative for myalgias, back pain and falls.  Skin: Negative for rash.  Neurological: Negative for dizziness, tremors, sensory change, focal weakness, loss of consciousness, weakness and headaches.  Endo/Heme/Allergies: Negative for polydipsia. Does not bruise/bleed easily.  Psychiatric/Behavioral: Negative for depression and suicidal ideas. The patient is nervous/anxious and has insomnia.     Objective  BP 170/81 mmHg  Pulse 71  Temp(Src) 97.7 F (36.5 C) (Oral)  Ht 5\' 3"  (1.6 m)  Wt 140 lb (63.504 kg)  BMI 24.81 kg/m2  SpO2  98%  Physical Exam  Physical Exam  Constitutional: She is oriented to person, place, and time and well-developed, well-nourished, and in no distress. No distress.  HENT:  Head: Normocephalic and atraumatic.  Right Ear: External ear normal.  Left Ear: External ear normal.  Nose: Nose normal.  Mouth/Throat: Oropharynx is clear and moist. No oropharyngeal exudate.  Eyes: Conjunctivae are normal. Pupils are equal, round, and reactive to light. Right eye exhibits no discharge. Left eye exhibits no discharge. No scleral icterus.  Neck: Normal range of motion. Neck supple. No thyromegaly present.  Cardiovascular: Normal rate, regular rhythm, normal heart sounds and intact distal pulses.   No murmur heard. Pulmonary/Chest: Effort normal and breath sounds normal. No respiratory distress. She has no wheezes. She has no rales.  Abdominal: Soft. Bowel sounds are normal. She exhibits no distension and no mass. There is no tenderness.  Musculoskeletal: Normal range of motion. She exhibits no edema or tenderness.  Lymphadenopathy:    She has no cervical adenopathy.  Neurological: She is alert and oriented to person, place, and time. She has normal reflexes. No cranial nerve deficit. Coordination normal.  Skin: Skin is warm and dry. No rash noted. She is not diaphoretic.  Psychiatric: Mood, memory and affect normal.    Lab Results  Component Value Date   TSH 3.583 06/07/2014   Lab Results  Component Value Date   WBC 3.2* 06/07/2014   HGB 13.2 06/07/2014   HCT 38.7 06/07/2014   MCV 84.5 06/07/2014   PLT 134* 06/07/2014   Lab Results  Component Value Date   CREATININE 0.83 06/07/2014   BUN 9 06/07/2014   NA 137 06/07/2014   K 4.6 06/07/2014   CL 102 06/07/2014   CO2 28 06/07/2014   Lab Results  Component Value Date   ALT 37* 06/07/2014   AST 36 06/07/2014   ALKPHOS 139* 06/07/2014   BILITOT 0.7 06/07/2014   Lab Results  Component Value Date   CHOL 151 06/07/2014   Lab Results   Component Value Date   HDL 48 06/07/2014   Lab Results  Component Value Date   LDLCALC 88 06/07/2014   Lab Results  Component Value Date   TRIG 77 06/07/2014   Lab Results  Component Value Date   CHOLHDL 3.1 06/07/2014     Assessment & Plan  Essential hypertension Patient reports bp 478 to 295 systolic at home. Will increase Lisinopril to bid and reassess   Hypothyroidism On Levothyroxine, continue to monitor   Hyperglycemia [id number] acceptable, minimize simple carbs. Increase exercise as tolerated.    Medicare annual wellness visit, subsequent Patient denies any difficulties at home.  No trouble with ADLs, depression or falls. No recent changes to vision or hearing. Is UTD with immunizations. Is UTD with screening. Discussed Advanced Directives, patient agrees to bring [place] copies of documents if can. Encouraged heart healthy diet, exercise as tolerated and adequate sleep. Follows with Dr Marin Olp Follows with Dr Deatra Ina, gastroenterology, last colonoscopy in 2012, repeat in 2017 Last Pap 2011, always normal, no need for repeat Last MGM in 2011, no concerns, declines for now Follows with Dr Stanford Breed of cardiology    Thrombocytopenia Mild, stable

## 2014-12-14 NOTE — Patient Instructions (Signed)
Preventive Care for Adults A healthy lifestyle and preventive care can promote health and wellness. Preventive health guidelines for women include the following key practices.  A routine yearly physical is a good way to check with your health care provider about your health and preventive screening. It is a chance to share any concerns and updates on your health and to receive a thorough exam.  Visit your dentist for a routine exam and preventive care every 6 months. Brush your teeth twice a day and floss once a day. Good oral hygiene prevents tooth decay and gum disease.  The frequency of eye exams is based on your age, health, family medical history, use of contact lenses, and other factors. Follow your health care provider's recommendations for frequency of eye exams.  Eat a healthy diet. Foods like vegetables, fruits, whole grains, low-fat dairy products, and lean protein foods contain the nutrients you need without too many calories. Decrease your intake of foods high in solid fats, added sugars, and salt. Eat the right amount of calories for you.Get information about a proper diet from your health care provider, if necessary.  Regular physical exercise is one of the most important things you can do for your health. Most adults should get at least 150 minutes of moderate-intensity exercise (any activity that increases your heart rate and causes you to sweat) each week. In addition, most adults need muscle-strengthening exercises on 2 or more days a week.  Maintain a healthy weight. The body mass index (BMI) is a screening tool to identify possible weight problems. It provides an estimate of body fat based on height and weight. Your health care provider can find your BMI and can help you achieve or maintain a healthy weight.For adults 20 years and older:  A BMI below 18.5 is considered underweight.  A BMI of 18.5 to 24.9 is normal.  A BMI of 25 to 29.9 is considered overweight.  A BMI of  30 and above is considered obese.  Maintain normal blood lipids and cholesterol levels by exercising and minimizing your intake of saturated fat. Eat a balanced diet with plenty of fruit and vegetables. Blood tests for lipids and cholesterol should begin at age 76 and be repeated every 5 years. If your lipid or cholesterol levels are high, you are over 50, or you are at high risk for heart disease, you may need your cholesterol levels checked more frequently.Ongoing high lipid and cholesterol levels should be treated with medicines if diet and exercise are not working.  If you smoke, find out from your health care provider how to quit. If you do not use tobacco, do not start.  Lung cancer screening is recommended for adults aged 22-80 years who are at high risk for developing lung cancer because of a history of smoking. A yearly low-dose CT scan of the lungs is recommended for people who have at least a 30-pack-year history of smoking and are a current smoker or have quit within the past 15 years. A pack year of smoking is smoking an average of 1 pack of cigarettes a day for 1 year (for example: 1 pack a day for 30 years or 2 packs a day for 15 years). Yearly screening should continue until the smoker has stopped smoking for at least 15 years. Yearly screening should be stopped for people who develop a health problem that would prevent them from having lung cancer treatment.  If you are pregnant, do not drink alcohol. If you are breastfeeding,  be very cautious about drinking alcohol. If you are not pregnant and choose to drink alcohol, do not have more than 1 drink per day. One drink is considered to be 12 ounces (355 mL) of beer, 5 ounces (148 mL) of wine, or 1.5 ounces (44 mL) of liquor.  Avoid use of street drugs. Do not share needles with anyone. Ask for help if you need support or instructions about stopping the use of drugs.  High blood pressure causes heart disease and increases the risk of  stroke. Your blood pressure should be checked at least every 1 to 2 years. Ongoing high blood pressure should be treated with medicines if weight loss and exercise do not work.  If you are 3-86 years old, ask your health care provider if you should take aspirin to prevent strokes.  Diabetes screening involves taking a blood sample to check your fasting blood sugar level. This should be done once every 3 years, after age 67, if you are within normal weight and without risk factors for diabetes. Testing should be considered at a younger age or be carried out more frequently if you are overweight and have at least 1 risk factor for diabetes.  Breast cancer screening is essential preventive care for women. You should practice "breast self-awareness." This means understanding the normal appearance and feel of your breasts and may include breast self-examination. Any changes detected, no matter how small, should be reported to a health care provider. Women in their 8s and 30s should have a clinical breast exam (CBE) by a health care provider as part of a regular health exam every 1 to 3 years. After age 70, women should have a CBE every year. Starting at age 25, women should consider having a mammogram (breast X-ray test) every year. Women who have a family history of breast cancer should talk to their health care provider about genetic screening. Women at a high risk of breast cancer should talk to their health care providers about having an MRI and a mammogram every year.  Breast cancer gene (BRCA)-related cancer risk assessment is recommended for women who have family members with BRCA-related cancers. BRCA-related cancers include breast, ovarian, tubal, and peritoneal cancers. Having family members with these cancers may be associated with an increased risk for harmful changes (mutations) in the breast cancer genes BRCA1 and BRCA2. Results of the assessment will determine the need for genetic counseling and  BRCA1 and BRCA2 testing.  Routine pelvic exams to screen for cancer are no longer recommended for nonpregnant women who are considered low risk for cancer of the pelvic organs (ovaries, uterus, and vagina) and who do not have symptoms. Ask your health care provider if a screening pelvic exam is right for you.  If you have had past treatment for cervical cancer or a condition that could lead to cancer, you need Pap tests and screening for cancer for at least 20 years after your treatment. If Pap tests have been discontinued, your risk factors (such as having a new sexual partner) need to be reassessed to determine if screening should be resumed. Some women have medical problems that increase the chance of getting cervical cancer. In these cases, your health care provider may recommend more frequent screening and Pap tests.  The HPV test is an additional test that may be used for cervical cancer screening. The HPV test looks for the virus that can cause the cell changes on the cervix. The cells collected during the Pap test can be  tested for HPV. The HPV test could be used to screen women aged 30 years and older, and should be used in women of any age who have unclear Pap test results. After the age of 30, women should have HPV testing at the same frequency as a Pap test.  Colorectal cancer can be detected and often prevented. Most routine colorectal cancer screening begins at the age of 50 years and continues through age 75 years. However, your health care provider may recommend screening at an earlier age if you have risk factors for colon cancer. On a yearly basis, your health care provider may provide home test kits to check for hidden blood in the stool. Use of a small camera at the end of a tube, to directly examine the colon (sigmoidoscopy or colonoscopy), can detect the earliest forms of colorectal cancer. Talk to your health care provider about this at age 50, when routine screening begins. Direct  exam of the colon should be repeated every 5-10 years through age 75 years, unless early forms of pre-cancerous polyps or small growths are found.  People who are at an increased risk for hepatitis B should be screened for this virus. You are considered at high risk for hepatitis B if:  You were born in a country where hepatitis B occurs often. Talk with your health care provider about which countries are considered high risk.  Your parents were born in a high-risk country and you have not received a shot to protect against hepatitis B (hepatitis B vaccine).  You have HIV or AIDS.  You use needles to inject street drugs.  You live with, or have sex with, someone who has hepatitis B.  You get hemodialysis treatment.  You take certain medicines for conditions like cancer, organ transplantation, and autoimmune conditions.  Hepatitis C blood testing is recommended for all people born from 1945 through 1965 and any individual with known risks for hepatitis C.  Practice safe sex. Use condoms and avoid high-risk sexual practices to reduce the spread of sexually transmitted infections (STIs). STIs include gonorrhea, chlamydia, syphilis, trichomonas, herpes, HPV, and human immunodeficiency virus (HIV). Herpes, HIV, and HPV are viral illnesses that have no cure. They can result in disability, cancer, and death.  You should be screened for sexually transmitted illnesses (STIs) including gonorrhea and chlamydia if:  You are sexually active and are younger than 24 years.  You are older than 24 years and your health care provider tells you that you are at risk for this type of infection.  Your sexual activity has changed since you were last screened and you are at an increased risk for chlamydia or gonorrhea. Ask your health care provider if you are at risk.  If you are at risk of being infected with HIV, it is recommended that you take a prescription medicine daily to prevent HIV infection. This is  called preexposure prophylaxis (PrEP). You are considered at risk if:  You are a heterosexual woman, are sexually active, and are at increased risk for HIV infection.  You take drugs by injection.  You are sexually active with a partner who has HIV.  Talk with your health care provider about whether you are at high risk of being infected with HIV. If you choose to begin PrEP, you should first be tested for HIV. You should then be tested every 3 months for as long as you are taking PrEP.  Osteoporosis is a disease in which the bones lose minerals and strength   with aging. This can result in serious bone fractures or breaks. The risk of osteoporosis can be identified using a bone density scan. Women ages 65 years and over and women at risk for fractures or osteoporosis should discuss screening with their health care providers. Ask your health care provider whether you should take a calcium supplement or vitamin D to reduce the rate of osteoporosis.  Menopause can be associated with physical symptoms and risks. Hormone replacement therapy is available to decrease symptoms and risks. You should talk to your health care provider about whether hormone replacement therapy is right for you.  Use sunscreen. Apply sunscreen liberally and repeatedly throughout the day. You should seek shade when your shadow is shorter than you. Protect yourself by wearing long sleeves, pants, a wide-brimmed hat, and sunglasses year round, whenever you are outdoors.  Once a month, do a whole body skin exam, using a mirror to look at the skin on your back. Tell your health care provider of new moles, moles that have irregular borders, moles that are larger than a pencil eraser, or moles that have changed in shape or color.  Stay current with required vaccines (immunizations).  Influenza vaccine. All adults should be immunized every year.  Tetanus, diphtheria, and acellular pertussis (Td, Tdap) vaccine. Pregnant women should  receive 1 dose of Tdap vaccine during each pregnancy. The dose should be obtained regardless of the length of time since the last dose. Immunization is preferred during the 27th-36th week of gestation. An adult who has not previously received Tdap or who does not know her vaccine status should receive 1 dose of Tdap. This initial dose should be followed by tetanus and diphtheria toxoids (Td) booster doses every 10 years. Adults with an unknown or incomplete history of completing a 3-dose immunization series with Td-containing vaccines should begin or complete a primary immunization series including a Tdap dose. Adults should receive a Td booster every 10 years.  Varicella vaccine. An adult without evidence of immunity to varicella should receive 2 doses or a second dose if she has previously received 1 dose. Pregnant females who do not have evidence of immunity should receive the first dose after pregnancy. This first dose should be obtained before leaving the health care facility. The second dose should be obtained 4-8 weeks after the first dose.  Human papillomavirus (HPV) vaccine. Females aged 13-26 years who have not received the vaccine previously should obtain the 3-dose series. The vaccine is not recommended for use in pregnant females. However, pregnancy testing is not needed before receiving a dose. If a female is found to be pregnant after receiving a dose, no treatment is needed. In that case, the remaining doses should be delayed until after the pregnancy. Immunization is recommended for any person with an immunocompromised condition through the age of 26 years if she did not get any or all doses earlier. During the 3-dose series, the second dose should be obtained 4-8 weeks after the first dose. The third dose should be obtained 24 weeks after the first dose and 16 weeks after the second dose.  Zoster vaccine. One dose is recommended for adults aged 60 years or older unless certain conditions are  present.  Measles, mumps, and rubella (MMR) vaccine. Adults born before 1957 generally are considered immune to measles and mumps. Adults born in 1957 or later should have 1 or more doses of MMR vaccine unless there is a contraindication to the vaccine or there is laboratory evidence of immunity to   each of the three diseases. A routine second dose of MMR vaccine should be obtained at least 28 days after the first dose for students attending postsecondary schools, health care workers, or international travelers. People who received inactivated measles vaccine or an unknown type of measles vaccine during 1963-1967 should receive 2 doses of MMR vaccine. People who received inactivated mumps vaccine or an unknown type of mumps vaccine before 1979 and are at high risk for mumps infection should consider immunization with 2 doses of MMR vaccine. For females of childbearing age, rubella immunity should be determined. If there is no evidence of immunity, females who are not pregnant should be vaccinated. If there is no evidence of immunity, females who are pregnant should delay immunization until after pregnancy. Unvaccinated health care workers born before 1957 who lack laboratory evidence of measles, mumps, or rubella immunity or laboratory confirmation of disease should consider measles and mumps immunization with 2 doses of MMR vaccine or rubella immunization with 1 dose of MMR vaccine.  Pneumococcal 13-valent conjugate (PCV13) vaccine. When indicated, a person who is uncertain of her immunization history and has no record of immunization should receive the PCV13 vaccine. An adult aged 19 years or older who has certain medical conditions and has not been previously immunized should receive 1 dose of PCV13 vaccine. This PCV13 should be followed with a dose of pneumococcal polysaccharide (PPSV23) vaccine. The PPSV23 vaccine dose should be obtained at least 8 weeks after the dose of PCV13 vaccine. An adult aged 19  years or older who has certain medical conditions and previously received 1 or more doses of PPSV23 vaccine should receive 1 dose of PCV13. The PCV13 vaccine dose should be obtained 1 or more years after the last PPSV23 vaccine dose.  Pneumococcal polysaccharide (PPSV23) vaccine. When PCV13 is also indicated, PCV13 should be obtained first. All adults aged 65 years and older should be immunized. An adult younger than age 65 years who has certain medical conditions should be immunized. Any person who resides in a nursing home or long-term care facility should be immunized. An adult smoker should be immunized. People with an immunocompromised condition and certain other conditions should receive both PCV13 and PPSV23 vaccines. People with human immunodeficiency virus (HIV) infection should be immunized as soon as possible after diagnosis. Immunization during chemotherapy or radiation therapy should be avoided. Routine use of PPSV23 vaccine is not recommended for American Indians, [place] Natives, or people younger than 65 years unless there are medical conditions that require PPSV23 vaccine. When indicated, people who have unknown immunization and have no record of immunization should receive PPSV23 vaccine. One-time revaccination 5 years after the first dose of PPSV23 is recommended for people aged 19-64 years who have chronic kidney failure, nephrotic syndrome, asplenia, or immunocompromised conditions. People who received 1-2 doses of PPSV23 before age 65 years should receive another dose of PPSV23 vaccine at age 65 years or later if at least 5 years have passed since the previous dose. Doses of PPSV23 are not needed for people immunized with PPSV23 at or after age 65 years.  Meningococcal vaccine. Adults with asplenia or persistent complement component deficiencies should receive 2 doses of quadrivalent meningococcal conjugate (MenACWY-D) vaccine. The doses should be obtained at least 2 months apart.  Microbiologists working with certain meningococcal bacteria, military recruits, people at risk during an outbreak, and people who travel to or live in countries with a high rate of meningitis should be immunized. A first-year college student up through age   21 years who is living in a residence hall should receive a dose if she did not receive a dose on or after her 16th birthday. Adults who have certain high-risk conditions should receive one or more doses of vaccine.  Hepatitis A vaccine. Adults who wish to be protected from this disease, have certain high-risk conditions, work with hepatitis A-infected animals, work in hepatitis A research labs, or travel to or work in countries with a high rate of hepatitis A should be immunized. Adults who were previously unvaccinated and who anticipate close contact with an international adoptee during the first 60 days after arrival in the [place] from a country with a high rate of hepatitis A should be immunized.  Hepatitis B vaccine. Adults who wish to be protected from this disease, have certain high-risk conditions, may be exposed to blood or other infectious body fluids, are household contacts or sex partners of hepatitis B positive people, are clients or workers in certain care facilities, or travel to or work in countries with a high rate of hepatitis B should be immunized.  Haemophilus influenzae type b (Hib) vaccine. A previously unvaccinated person with asplenia or sickle cell disease or having a scheduled splenectomy should receive 1 dose of Hib vaccine. Regardless of previous immunization, a recipient of a hematopoietic stem cell transplant should receive a 3-dose series 6-12 months after her successful transplant. Hib vaccine is not recommended for adults with HIV infection. Preventive Services / Frequency Ages 64 to 68 years  Blood pressure check.** / Every 1 to 2 years.  Lipid and cholesterol check.** / Every 5 years beginning at age  22.  Clinical breast exam.** / Every 3 years for women in their 88s and 53s.  BRCA-related cancer risk assessment.** / For women who have family members with a BRCA-related cancer (breast, ovarian, tubal, or peritoneal cancers).  Pap test.** / Every 2 years from ages 90 through 51. Every 3 years starting at age 21 through age 56 or 3 with a history of 3 consecutive normal Pap tests.  HPV screening.** / Every 3 years from ages 24 through ages 1 to 46 with a history of 3 consecutive normal Pap tests.  Hepatitis C blood test.** / For any individual with known risks for hepatitis C.  Skin self-exam. / Monthly.  Influenza vaccine. / Every year.  Tetanus, diphtheria, and acellular pertussis (Tdap, Td) vaccine.** / Consult your health care provider. Pregnant women should receive 1 dose of Tdap vaccine during each pregnancy. 1 dose of Td every 10 years.  Varicella vaccine.** / Consult your health care provider. Pregnant females who do not have evidence of immunity should receive the first dose after pregnancy.  HPV vaccine. / 3 doses over 6 months, if 72 and younger. The vaccine is not recommended for use in pregnant females. However, pregnancy testing is not needed before receiving a dose.  Measles, mumps, rubella (MMR) vaccine.** / You need at least 1 dose of MMR if you were born in 1957 or later. You may also need a 2nd dose. For females of childbearing age, rubella immunity should be determined. If there is no evidence of immunity, females who are not pregnant should be vaccinated. If there is no evidence of immunity, females who are pregnant should delay immunization until after pregnancy.  Pneumococcal 13-valent conjugate (PCV13) vaccine.** / Consult your health care provider.  Pneumococcal polysaccharide (PPSV23) vaccine.** / 1 to 2 doses if you smoke cigarettes or if you have certain conditions.  Meningococcal vaccine.** /  1 dose if you are age 19 to 21 years and a first-year college  student living in a residence hall, or have one of several medical conditions, you need to get vaccinated against meningococcal disease. You may also need additional booster doses.  Hepatitis A vaccine.** / Consult your health care provider.  Hepatitis B vaccine.** / Consult your health care provider.  Haemophilus influenzae type b (Hib) vaccine.** / Consult your health care provider. Ages 40 to 64 years  Blood pressure check.** / Every 1 to 2 years.  Lipid and cholesterol check.** / Every 5 years beginning at age 20 years.  Lung cancer screening. / Every year if you are aged 55-80 years and have a 30-pack-year history of smoking and currently smoke or have quit within the past 15 years. Yearly screening is stopped once you have quit smoking for at least 15 years or develop a health problem that would prevent you from having lung cancer treatment.  Clinical breast exam.** / Every year after age 40 years.  BRCA-related cancer risk assessment.** / For women who have family members with a BRCA-related cancer (breast, ovarian, tubal, or peritoneal cancers).  Mammogram.** / Every year beginning at age 40 years and continuing for as long as you are in good health. Consult with your health care provider.  Pap test.** / Every 3 years starting at age 30 years through age 65 or 70 years with a history of 3 consecutive normal Pap tests.  HPV screening.** / Every 3 years from ages 30 years through ages 65 to 70 years with a history of 3 consecutive normal Pap tests.  Fecal occult blood test (FOBT) of stool. / Every year beginning at age 50 years and continuing until age 75 years. You may not need to do this test if you get a colonoscopy every 10 years.  Flexible sigmoidoscopy or colonoscopy.** / Every 5 years for a flexible sigmoidoscopy or every 10 years for a colonoscopy beginning at age 50 years and continuing until age 75 years.  Hepatitis C blood test.** / For all people born from 1945 through  1965 and any individual with known risks for hepatitis C.  Skin self-exam. / Monthly.  Influenza vaccine. / Every year.  Tetanus, diphtheria, and acellular pertussis (Tdap/Td) vaccine.** / Consult your health care provider. Pregnant women should receive 1 dose of Tdap vaccine during each pregnancy. 1 dose of Td every 10 years.  Varicella vaccine.** / Consult your health care provider. Pregnant females who do not have evidence of immunity should receive the first dose after pregnancy.  Zoster vaccine.** / 1 dose for adults aged 60 years or older.  Measles, mumps, rubella (MMR) vaccine.** / You need at least 1 dose of MMR if you were born in 1957 or later. You may also need a 2nd dose. For females of childbearing age, rubella immunity should be determined. If there is no evidence of immunity, females who are not pregnant should be vaccinated. If there is no evidence of immunity, females who are pregnant should delay immunization until after pregnancy.  Pneumococcal 13-valent conjugate (PCV13) vaccine.** / Consult your health care provider.  Pneumococcal polysaccharide (PPSV23) vaccine.** / 1 to 2 doses if you smoke cigarettes or if you have certain conditions.  Meningococcal vaccine.** / Consult your health care provider.  Hepatitis A vaccine.** / Consult your health care provider.  Hepatitis B vaccine.** / Consult your health care provider.  Haemophilus influenzae type b (Hib) vaccine.** / Consult your health care provider. Ages 65   years and over  Blood pressure check.** / Every 1 to 2 years.  Lipid and cholesterol check.** / Every 5 years beginning at age 22 years.  Lung cancer screening. / Every year if you are aged 73-80 years and have a 30-pack-year history of smoking and currently smoke or have quit within the past 15 years. Yearly screening is stopped once you have quit smoking for at least 15 years or develop a health problem that would prevent you from having lung cancer  treatment.  Clinical breast exam.** / Every year after age 4 years.  BRCA-related cancer risk assessment.** / For women who have family members with a BRCA-related cancer (breast, ovarian, tubal, or peritoneal cancers).  Mammogram.** / Every year beginning at age 40 years and continuing for as long as you are in good health. Consult with your health care provider.  Pap test.** / Every 3 years starting at age 9 years through age 34 or 91 years with 3 consecutive normal Pap tests. Testing can be stopped between 65 and 70 years with 3 consecutive normal Pap tests and no abnormal Pap or HPV tests in the past 10 years.  HPV screening.** / Every 3 years from ages 57 years through ages 64 or 45 years with a history of 3 consecutive normal Pap tests. Testing can be stopped between 65 and 70 years with 3 consecutive normal Pap tests and no abnormal Pap or HPV tests in the past 10 years.  Fecal occult blood test (FOBT) of stool. / Every year beginning at age 15 years and continuing until age 17 years. You may not need to do this test if you get a colonoscopy every 10 years.  Flexible sigmoidoscopy or colonoscopy.** / Every 5 years for a flexible sigmoidoscopy or every 10 years for a colonoscopy beginning at age 86 years and continuing until age 71 years.  Hepatitis C blood test.** / For all people born from [birth year] through 1963 and any individual with known risks for hepatitis C.  Osteoporosis screening.** / A one-time screening for women ages 83 years and over and women at risk for fractures or osteoporosis.  Skin self-exam. / Monthly.  Influenza vaccine. / Every year.  Tetanus, diphtheria, and acellular pertussis (Tdap/Td) vaccine.** / 1 dose of Td every 10 years.  Varicella vaccine.** / Consult your health care provider.  Zoster vaccine.** / 1 dose for adults aged 61 years or older.  Pneumococcal 13-valent conjugate (PCV13) vaccine.** / Consult your health care provider.  Pneumococcal  polysaccharide (PPSV23) vaccine.** / 1 dose for all adults aged 28 years and older.  Meningococcal vaccine.** / Consult your health care provider.  Hepatitis A vaccine.** / Consult your health care provider.  Hepatitis B vaccine.** / Consult your health care provider.  Haemophilus influenzae type b (Hib) vaccine.** / Consult your health care provider. ** Family history and personal history of risk and conditions may change your health care provider's recommendations. Document Released: 12/29/2001 Document Revised: 03/19/2014 Document Reviewed: 03/30/2011 [facility] Patient Information 2015 [place], [place]. This information is not intended to replace advice given to you by your health care provider. Make sure you discuss any questions you have with your health care provider.

## 2014-12-17 ENCOUNTER — Encounter: Payer: Self-pay | Admitting: *Deleted

## 2014-12-19 DIAGNOSIS — H524 Presbyopia: Secondary | ICD-10-CM | POA: Diagnosis not present

## 2014-12-19 DIAGNOSIS — H0289 Other specified disorders of eyelid: Secondary | ICD-10-CM | POA: Diagnosis not present

## 2014-12-19 DIAGNOSIS — H2513 Age-related nuclear cataract, bilateral: Secondary | ICD-10-CM | POA: Diagnosis not present

## 2014-12-19 LAB — HM DIABETES EYE EXAM

## 2014-12-23 NOTE — Assessment & Plan Note (Signed)
Patient reports bp 034 to 742 systolic at home. Will increase Lisinopril to bid and reassess

## 2014-12-23 NOTE — Assessment & Plan Note (Signed)
hgba1c acceptable, minimize simple carbs. Increase exercise as tolerated.  

## 2014-12-23 NOTE — Assessment & Plan Note (Signed)
On Levothyroxine, continue to monitor 

## 2014-12-23 NOTE — Assessment & Plan Note (Signed)
Patient denies any difficulties at home. No trouble with ADLs, depression or falls. No recent changes to vision or hearing. Is UTD with immunizations. Is UTD with screening. Discussed Advanced Directives, patient agrees to bring [place] copies of documents if can. Encouraged heart healthy diet, exercise as tolerated and adequate sleep. Follows with Dr Marin Olp Follows with Dr Deatra Ina, gastroenterology, last colonoscopy in 2012, repeat in 2017 Last Pap 2011, always normal, no need for repeat Last MGM in 2011, no concerns, declines for now Follows with Dr Stanford Breed of cardiology

## 2014-12-23 NOTE — Assessment & Plan Note (Signed)
Mild, stable.  

## 2014-12-24 ENCOUNTER — Encounter: Payer: Self-pay | Admitting: *Deleted

## 2015-02-09 ENCOUNTER — Other Ambulatory Visit: Payer: Self-pay | Admitting: Cardiology

## 2015-02-10 NOTE — Progress Notes (Signed)
HPI: FU nonischemic cardiomyopathy. Cardiac catheterization in 2010 showed an ejection fraction of 15-20% and normal coronary arteries. Last echocardiogram in September 2014 showed improvement of LV function. Her ejection fraction was 45-50% and there was mild mitral regurgitation. Since last seen, the patient denies any dyspnea on exertion, orthopnea, PND, pedal edema, palpitations, syncope or chest pain.    Current Outpatient Prescriptions  Medication Sig Dispense Refill  . bisoprolol (ZEBETA) 5 MG tablet Take 1 tablet (5 mg total) by mouth daily. 30 tablet 6  . levothyroxine (SYNTHROID, LEVOTHROID) 25 MCG tablet take 1/2 tablet by mouth once daily 30 tablet 5  . lisinopril (PRINIVIL,ZESTRIL) 10 MG tablet Take 10 mg by mouth daily.    . Multiple Vitamins-Minerals (CENTRUM PO) Take by mouth daily.      . Omega-3 Fatty Acids (FISH OIL PO) Take 1 tablet by mouth daily.     No current facility-administered medications for this visit.     Past Medical History  Diagnosis Date  . Cardiomyopathy     non ischemic NL cos on cath 08/2009, EF to 15-20%, Her follow up  2D echo  in 10/2009 showed impoved EFo 35-40%  . CHF (congestive heart failure)   . Hypertension   . Anxiety   . History of lymphoma     Dr Martha Clan  . Hypothyroid   . Medicare annual wellness visit, subsequent 12/14/2014    Follows with Dr Marin Olp Follows with Dr Deatra Ina, gastroenterology, last colonoscopy in 2012, repeat in 2017 Last Pap 2011, always normal, no need for repeat Last MGM in 2011, no concerns, declines for now Follows with Dr Stanford Breed of cardiology     Past Surgical History  Procedure Laterality Date  . Tonsillectomy  1952  . Dilation and curettage of uterus    . Nasal polyps removed      age 16    History   Social History  . Marital Status: Married    Spouse Name: N/A  . Number of Children: N/A  . Years of Education: N/A   Occupational History  . Not on file.   Social History Main Topics    . Smoking status: Never Smoker   . Smokeless tobacco: Never Used     Comment: never used tobacco  . Alcohol Use: No  . Drug Use: No  . Sexual Activity: Not on file     Comment: lives with husband, no dietary restrictions, minimizes dairy   Other Topics Concern  . Not on file   Social History Narrative   The patient is married ( husband Jolette Lana)  She just recently celebrated     her 52nd anniversary.  She has five children.  There is no active     tobacco or alcohol use history.          Smoking Status:  never   Caffeine use/day:  None   Does Patient Exercise:  yes    ROS: no fevers or chills, productive cough, hemoptysis, dysphasia, odynophagia, melena, hematochezia, dysuria, hematuria, rash, seizure activity, orthopnea, PND, pedal edema, claudication. Remaining systems are negative.  Physical Exam: Well-developed well-nourished in no acute distress.  Skin is warm and dry.  HEENT is normal.  Neck is supple.  Chest is clear to auscultation with normal expansion.  Cardiovascular exam is regular rate and rhythm.  Abdominal exam nontender or distended. No masses palpated. Extremities show no edema. neuro grossly intact  ECG sinus rhythm at a rate of 56. Left ventricular hypertrophy.  Left anterior fascicular block. Cannot rule out prior septal infarct. Inferior lateral T-wave inversion.

## 2015-02-11 ENCOUNTER — Other Ambulatory Visit: Payer: Self-pay

## 2015-02-11 MED ORDER — BISOPROLOL FUMARATE 5 MG PO TABS: 5.0000 mg | ORAL_TABLET | Freq: Every day | ORAL | Status: DC

## 2015-02-12 ENCOUNTER — Ambulatory Visit (INDEPENDENT_AMBULATORY_CARE_PROVIDER_SITE_OTHER): Payer: Medicare Other | Admitting: Cardiology

## 2015-02-12 ENCOUNTER — Encounter: Payer: Self-pay | Admitting: Cardiology

## 2015-02-12 VITALS — BP 120/70 | HR 56 | Ht 63.0 in | Wt 140.7 lb

## 2015-02-12 DIAGNOSIS — I10 Essential (primary) hypertension: Secondary | ICD-10-CM | POA: Diagnosis not present

## 2015-02-12 NOTE — Assessment & Plan Note (Signed)
Continue beta blocker and ACE inhibitor. Her cardiomyopathy may have been related to hypertension. I am hesitant to increase medications as she had orthostatic symptoms with low-dose diuretic previously.

## 2015-02-12 NOTE — Assessment & Plan Note (Signed)
Blood pressure appears to be controlled on present medications. She will follow this at home and we will increase lisinopril as needed.

## 2015-02-12 NOTE — Patient Instructions (Signed)
Your physician wants you to follow-up in: ONE YEAR WITH DR CRENSHAW You will receive a reminder letter in the mail two months in advance. If you don't receive a letter, please call our office to schedule the follow-up appointment.  

## 2015-04-02 ENCOUNTER — Encounter: Payer: Self-pay | Admitting: Gastroenterology

## 2015-04-07 ENCOUNTER — Other Ambulatory Visit: Payer: Self-pay | Admitting: Family Medicine

## 2015-04-18 ENCOUNTER — Other Ambulatory Visit (HOSPITAL_BASED_OUTPATIENT_CLINIC_OR_DEPARTMENT_OTHER): Payer: Medicare Other

## 2015-04-18 ENCOUNTER — Encounter: Payer: Self-pay | Admitting: Family

## 2015-04-18 ENCOUNTER — Ambulatory Visit (HOSPITAL_BASED_OUTPATIENT_CLINIC_OR_DEPARTMENT_OTHER): Payer: Medicare Other | Admitting: Family

## 2015-04-18 VITALS — BP 164/59 | HR 61 | Temp 97.8°F | Resp 14 | Ht 63.0 in | Wt 137.0 lb

## 2015-04-18 DIAGNOSIS — Z8572 Personal history of non-Hodgkin lymphomas: Secondary | ICD-10-CM | POA: Diagnosis not present

## 2015-04-18 DIAGNOSIS — C8590 Non-Hodgkin lymphoma, unspecified, unspecified site: Secondary | ICD-10-CM

## 2015-04-18 DIAGNOSIS — C8599 Non-Hodgkin lymphoma, unspecified, extranodal and solid organ sites: Secondary | ICD-10-CM

## 2015-04-18 LAB — CBC WITH DIFFERENTIAL (CANCER CENTER ONLY)
BASO#: 0 10*3/uL (ref 0.0–0.2)
BASO%: 0.9 % (ref 0.0–2.0)
EOS%: 2.4 % (ref 0.0–7.0)
Eosinophils Absolute: 0.1 10*3/uL (ref 0.0–0.5)
HEMATOCRIT: 40.4 % (ref 34.8–46.6)
HGB: 13.4 g/dL (ref 11.6–15.9)
LYMPH#: 1.5 10*3/uL (ref 0.9–3.3)
LYMPH%: 44.9 % (ref 14.0–48.0)
MCH: 29.9 pg (ref 26.0–34.0)
MCHC: 33.2 g/dL (ref 32.0–36.0)
MCV: 90 fL (ref 81–101)
MONO#: 0.5 10*3/uL (ref 0.1–0.9)
MONO%: 14.1 % — ABNORMAL HIGH (ref 0.0–13.0)
NEUT%: 37.7 % — ABNORMAL LOW (ref 39.6–80.0)
NEUTROS ABS: 1.3 10*3/uL — AB (ref 1.5–6.5)
Platelets: 133 10*3/uL — ABNORMAL LOW (ref 145–400)
RBC: 4.48 10*6/uL (ref 3.70–5.32)
RDW: 12.2 % (ref 11.1–15.7)
WBC: 3.3 10*3/uL — ABNORMAL LOW (ref 3.9–10.0)

## 2015-04-18 LAB — CHCC SATELLITE - SMEAR

## 2015-04-18 NOTE — Progress Notes (Signed)
Hematology and Oncology Follow Up Visit  Crystal [name] [id number] January 05, 1934 81 y.o. 04/18/2015   Principle Diagnosis:  Diffuse large cell non-Hodgkin's lymphoma-remission  Current Therapy:   Observation    Interim History:  Crystal [name] is here today for a follow-up. She is doing well and staying very active. She went hiking yesterday and just finished first in the [employer] in [place]. She is very excited about this.  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  She has had no problem with infections. No fever, chills, n/v, cough, rash, dizziness, SOB, chest pain, palpitations, abdominal pain, constipation, diarrhea, blood in urine or stool.  No lymphadenopathy on exam.  No swelling, tenderness, numbness or tingling in her extremities. No new aches or pains.  Her appetite is good and she is making sure to stay hydrated. Her weight is stable.   Medications:    Medication List       This list is accurate as of: 04/18/15 10:59 AM.  Always use your most recent med list.               bisoprolol 5 MG tablet  Commonly known as:  ZEBETA  Take 1 tablet (5 mg total) by mouth daily.     CENTRUM PO  Take by mouth daily.     FISH OIL PO  Take 1 tablet by mouth daily.     levothyroxine 25 MCG tablet  Commonly known as:  SYNTHROID, LEVOTHROID  take 1/2 tablet by mouth once daily     lisinopril 10 MG tablet  Commonly known as:  PRINIVIL,ZESTRIL  Take 10 mg by mouth daily.        Allergies:  Allergies  Allergen Reactions  . Iohexol      Desc: hives,itiching     Past Medical History, Surgical history, Social history, and Family History were reviewed and updated.  Review of Systems: All other 10 point review of systems is negative.   Physical Exam:  height is '5\' 3"'$  (1.6 m) and weight is 137 lb (62.143 kg). Her oral temperature is 97.8 F (36.6 C). Her blood pressure is 164/59 and her pulse is 61. Her respiration is  14.   Wt Readings from Last 3 Encounters:  04/18/15 137 lb (62.143 kg)  02/12/15 140 lb 11.2 oz (63.821 kg)  12/14/14 140 lb (63.504 kg)    Ocular: Sclerae unicteric, pupils equal, round and reactive to light Ear-nose-throat: Oropharynx clear, dentition fair Lymphatic: No cervical or supraclavicular adenopathy Lungs no rales or rhonchi, good excursion bilaterally Heart regular rate and rhythm, no murmur appreciated Abd soft, nontender, positive bowel sounds MSK no focal spinal tenderness, no joint edema Neuro: non-focal, well-oriented, appropriate affect Breasts: Deferred  Lab Results  Component Value Date   WBC 3.3* 04/18/2015   HGB 13.4 04/18/2015   HCT 40.4 04/18/2015   MCV 90 04/18/2015   PLT 133* 04/18/2015   No results found for: FERRITIN, IRON, TIBC, UIBC, IRONPCTSAT Lab Results  Component Value Date   RETICCTPCT 1.1 10/15/2011   RBC 4.48 04/18/2015   RETICCTABS 49.7 10/15/2011   No results found for: KPAFRELGTCHN, LAMBDASER, KAPLAMBRATIO No results found for: IGGSERUM, IGA, IGMSERUM No results found for: TOTALPROTELP, ALBUMINELP, A1GS, A2GS, BETS, BETA2SER, GAMS, MSPIKE, SPEI   Chemistry      Component Value Date/Time   NA 138 12/14/2014 1015   K 3.9 12/14/2014 1015   CL 102 12/14/2014 1015   CO2 30  12/14/2014 1015   BUN 16 12/14/2014 1015   CREATININE 0.99 12/14/2014 1015   CREATININE 0.83 06/07/2014 1224      Component Value Date/Time   CALCIUM 10.0 12/14/2014 1015   ALKPHOS 143* 12/14/2014 1015   AST 30 12/14/2014 1015   ALT 33 12/14/2014 1015   BILITOT 0.6 12/14/2014 1015     Impression and Plan: Crystal [name] is a very pleasant 81 year old African American female with a history of diffuse large cell non-Hodgkin lymphoma. She is now 14 years from therapy and doing very well. She is asymptomatic at this time and there has been no evidence of recurrence.  Her WBC count is 3.3 and platelet count is 133. No anemia.  Dr. Marin Olp will view her blood smear  later today.  We will plan to see her back in 1 year for follow-up and labs.  She knows to contact us with any questions or concerns. We can certainly see her sooner if need be.   Eliezer Bottom, NP 6/2/201610:59 AM

## 2015-06-11 ENCOUNTER — Encounter: Payer: Self-pay | Admitting: Family

## 2015-06-11 ENCOUNTER — Ambulatory Visit (INDEPENDENT_AMBULATORY_CARE_PROVIDER_SITE_OTHER): Payer: Medicare Other | Admitting: Family

## 2015-06-11 VITALS — BP 150/70 | HR 76 | Temp 97.8°F | Resp 18 | Ht 63.0 in | Wt 138.8 lb

## 2015-06-11 DIAGNOSIS — R310 Gross hematuria: Secondary | ICD-10-CM | POA: Diagnosis not present

## 2015-06-11 DIAGNOSIS — Z87898 Personal history of other specified conditions: Secondary | ICD-10-CM | POA: Diagnosis not present

## 2015-06-11 DIAGNOSIS — I509 Heart failure, unspecified: Secondary | ICD-10-CM

## 2015-06-11 DIAGNOSIS — N390 Urinary tract infection, site not specified: Secondary | ICD-10-CM

## 2015-06-11 DIAGNOSIS — R319 Hematuria, unspecified: Secondary | ICD-10-CM

## 2015-06-11 DIAGNOSIS — E039 Hypothyroidism, unspecified: Secondary | ICD-10-CM

## 2015-06-11 DIAGNOSIS — I10 Essential (primary) hypertension: Secondary | ICD-10-CM

## 2015-06-11 LAB — CBC WITH DIFFERENTIAL/PLATELET
Basophils Absolute: 0 10*3/uL (ref 0.0–0.1)
Basophils Relative: 0.6 % (ref 0.0–3.0)
EOS PCT: 1.7 % (ref 0.0–5.0)
Eosinophils Absolute: 0.1 10*3/uL (ref 0.0–0.7)
HCT: 39.9 % (ref 36.0–46.0)
HEMOGLOBIN: 13.3 g/dL (ref 12.0–15.0)
LYMPHS ABS: 1.5 10*3/uL (ref 0.7–4.0)
LYMPHS PCT: 39.3 % (ref 12.0–46.0)
MCHC: 33.2 g/dL (ref 30.0–36.0)
MCV: 88.6 fl (ref 78.0–100.0)
Monocytes Absolute: 0.5 10*3/uL (ref 0.1–1.0)
Monocytes Relative: 11.6 % (ref 3.0–12.0)
NEUTROS PCT: 46.8 % (ref 43.0–77.0)
Neutro Abs: 1.8 10*3/uL (ref 1.4–7.7)
Platelets: 129 10*3/uL — ABNORMAL LOW (ref 150.0–400.0)
RBC: 4.5 Mil/uL (ref 3.87–5.11)
RDW: 13 % (ref 11.5–15.5)
WBC: 3.9 10*3/uL — ABNORMAL LOW (ref 4.0–10.5)

## 2015-06-11 LAB — POCT URINALYSIS DIPSTICK
Bilirubin, UA: NEGATIVE
Glucose, UA: NEGATIVE
KETONES UA: NEGATIVE
Nitrite, UA: NEGATIVE
PH UA: 6.5
Protein, UA: NEGATIVE
Spec Grav, UA: 1.015
UROBILINOGEN UA: 0.2

## 2015-06-11 LAB — TSH: TSH: 3.62 u[IU]/mL (ref 0.35–4.50)

## 2015-06-11 MED ORDER — BISOPROLOL FUMARATE 10 MG PO TABS: 10.0000 mg | ORAL_TABLET | Freq: Every day | ORAL | Status: DC

## 2015-06-11 MED ORDER — CIPROFLOXACIN HCL 250 MG PO TABS: 250.0000 mg | ORAL_TABLET | Freq: Two times a day (BID) | ORAL | Status: DC

## 2015-06-11 NOTE — Patient Instructions (Addendum)
Complete lab work prior to leaving. Start cipro for urinary tract infection- call if you see bright red blood again in your urine. Stop Lisinopril due to swelling. Increase bisoprolol from '5mg'$  to '10mg'$  once daily.   Go to ER if you develop tongue/lip swelling or shortness of breath. Start antibiotics for urinary tract infection. Follow up in 2 weeks.

## 2015-06-11 NOTE — Progress Notes (Signed)
Subjective:    Patient ID: Crystal [name], female    DOB: February 20, 1934, 81 y.o.   MRN: [id number]  HPI  Crystal [name] is a 81 yr old female who presents today with two concerns:  1) Facial swelling- reports that she woke up in the AM 6/9  with left cheek swollen, went down. She had swelling last Tuesday her lips and cheek swelled. She denied associated sob.    2) Hematuria-  X 2 day. Denies dysuria.  Denies fever or low back pain.  No significant frequency.  3) Hypothyroid- requests tsh to be checked.  Reports + med compliance with synthroid.   Review of Systems See HPI  Past Medical History  Diagnosis Date  . Cardiomyopathy     non ischemic NL cos on cath 08/2009, EF to 15-20%, Her follow up  2D echo  in 10/2009 showed impoved EFo 35-40%  . CHF (congestive heart failure)   . Hypertension   . Anxiety   . History of lymphoma     Dr Martha Clan  . Hypothyroid   . Medicare annual wellness visit, subsequent 12/14/2014    Follows with Dr Marin Olp Follows with Dr Deatra Ina, gastroenterology, last colonoscopy in 2012, repeat in 2017 Last Pap 2011, always normal, no need for repeat Last MGM in 2011, no concerns, declines for now Follows with Dr Stanford Breed of cardiology     History   Social History  . Marital Status: Married    Spouse Name: N/A  . Number of Children: N/A  . Years of Education: N/A   Occupational History  . Not on file.   Social History Main Topics  . Smoking status: Never Smoker   . Smokeless tobacco: Never Used     Comment: never used tobacco  . Alcohol Use: No  . Drug Use: No  . Sexual Activity: Not on file     Comment: lives with husband, no dietary restrictions, minimizes dairy   Other Topics Concern  . Not on file   Social History Narrative   The patient is married ( husband Crystal [name])  She just recently celebrated     her 52nd anniversary.  She has five children.  There is no active     tobacco or alcohol use history.          Smoking Status:  never   Caffeine use/day:  None   Does Patient Exercise:  yes    Past Surgical History  Procedure Laterality Date  . Tonsillectomy  1952  . Dilation and curettage of uterus    . Nasal polyps removed      age 60    Family History  Problem Relation Age of Onset  . Diabetes Mother     deceased secondary to diabetes  . Hypertension Mother   . Vision loss Mother   . Pneumonia Father     died age 48 due to complications of pneumonia  . Colon polyps Father   . Liver disease Daughter   . Sarcoidosis Daughter   . Diabetes Sister   . Other      no obvious premature cardiovascular disease or familial cardiomyopathy is noted  . Colon cancer Brother 37  . Cancer Brother   . Obesity Son   . Alcohol abuse Neg Hx   . Diabetes Brother   . Kidney disease Brother   . Diabetes Sister   . Sarcoidosis Sister   . Arthritis Sister   . Arthritis Sister   . Diabetes Sister   .  Arthritis Sister   . Diabetes Sister   . Hypertension Daughter   . Hypertension Daughter     Allergies  Allergen Reactions  . Iohexol      Desc: hives,itiching     Current Outpatient Prescriptions on File Prior to Visit  Medication Sig Dispense Refill  . bisoprolol (ZEBETA) 5 MG tablet Take 1 tablet (5 mg total) by mouth daily. 30 tablet 6  . levothyroxine (SYNTHROID, LEVOTHROID) 25 MCG tablet take 1/2 tablet by mouth once daily 30 tablet 5  . lisinopril (PRINIVIL,ZESTRIL) 10 MG tablet Take 10 mg by mouth daily.    . Multiple Vitamins-Minerals (CENTRUM PO) Take by mouth daily.      . Omega-3 Fatty Acids (FISH OIL PO) Take 1 tablet by mouth daily.     No current facility-administered medications on file prior to visit.    BP 150/70 mmHg  Pulse 76  Temp(Src) 97.8 F (36.6 C) (Oral)  Resp 18  Ht '5\' 3"'$  (1.6 m)  Wt 138 lb 12.8 oz (62.959 kg)  BMI 24.59 kg/m2  SpO2 99%       Objective:   Physical Exam  Constitutional: She appears well-developed and well-nourished.  HENT:  No tongue or lip swelling noted    Cardiovascular: Normal rate, regular rhythm and normal heart sounds.   No murmur heard. Pulmonary/Chest: Effort normal and breath sounds normal. No respiratory distress. She has no wheezes.  Abdominal: Soft. She exhibits no distension.  Genitourinary:  Neg CVAT bilaterally  Musculoskeletal: She exhibits no edema.  Psychiatric: She has a normal mood and affect. Her behavior is normal. Judgment and thought content normal.          Assessment & Plan:

## 2015-06-11 NOTE — Assessment & Plan Note (Signed)
Suspect due to ACE. No symptoms currently. D/C lisinopril. Pt advised to go to the ED if recurrent tongue/lip/facial swelling.

## 2015-06-11 NOTE — Assessment & Plan Note (Addendum)
Gross hematuria is resolved but + Leuks + blood on UA.  Will rx with cipro and send urine for culture.  Obtain CBC to evaluate for anemia. Will need follow up UA at next visit to ensure resolution of microscopic hematuria. If persistent hematuria will need additional work up.

## 2015-06-11 NOTE — Assessment & Plan Note (Signed)
Increase bisoprolol from '5mg'$  to '10mg'$  for BP control since we are d/cing her ACE inhibitor.

## 2015-06-11 NOTE — Assessment & Plan Note (Signed)
Clinically stable. Will forward this note to her cardiologist since we are d/cing ACE.

## 2015-06-11 NOTE — Assessment & Plan Note (Signed)
Obtain follow up TSH.

## 2015-06-11 NOTE — Progress Notes (Signed)
Pre visit review using our clinic review tool, if applicable. No additional management support is needed unless otherwise documented below in the visit note. 

## 2015-06-14 LAB — URINE CULTURE: Colony Count: >=100000

## 2015-06-19 ENCOUNTER — Encounter: Payer: Self-pay | Admitting: Family

## 2015-07-18 ENCOUNTER — Encounter: Payer: Self-pay | Admitting: Family Medicine

## 2015-07-18 ENCOUNTER — Ambulatory Visit (INDEPENDENT_AMBULATORY_CARE_PROVIDER_SITE_OTHER): Payer: Medicare Other | Admitting: Family Medicine

## 2015-07-18 VITALS — BP 132/80 | HR 58 | Temp 98.0°F | Ht 63.0 in | Wt 139.1 lb

## 2015-07-18 DIAGNOSIS — Z87898 Personal history of other specified conditions: Secondary | ICD-10-CM

## 2015-07-18 DIAGNOSIS — D696 Thrombocytopenia, unspecified: Secondary | ICD-10-CM | POA: Diagnosis not present

## 2015-07-18 DIAGNOSIS — R739 Hyperglycemia, unspecified: Secondary | ICD-10-CM | POA: Diagnosis not present

## 2015-07-18 DIAGNOSIS — E039 Hypothyroidism, unspecified: Secondary | ICD-10-CM

## 2015-07-18 DIAGNOSIS — I10 Essential (primary) hypertension: Secondary | ICD-10-CM | POA: Diagnosis not present

## 2015-07-18 NOTE — Assessment & Plan Note (Signed)
Well controlled, no changes to meds. Encouraged heart healthy diet such as the DASH diet and exercise as tolerated.  °

## 2015-07-18 NOTE — Progress Notes (Signed)
Subjective:    Patient ID: Crystal [name], female    DOB: [birth year], 81 y.o.   MRN: [id number]  Chief Complaint  Patient presents with  . Follow-up    facial swelling    HPI Patient is in today for follow-up. Overall feeling better. Reports swelling is improved. No other recent illness. Denies any fevers. Is eating well, breathing well and offers no acute concerns. Denies CP/palp/SOB/HA/congestion/fevers/GI or GU c/o. Taking meds as prescribed  Past Medical History  Diagnosis Date  . Cardiomyopathy     non ischemic NL cos on cath 08/2009, EF to 15-20%, Her follow up  2D echo  in 10/2009 showed impoved EFo 35-40%  . CHF (congestive heart failure)   . Hypertension   . Anxiety   . History of lymphoma     Dr Martha Clan  . Hypothyroid   . Medicare annual wellness visit, subsequent 12/14/2014    Follows with Dr Marin Olp Follows with Dr Deatra Ina, gastroenterology, last colonoscopy in 2012, repeat in 2017 Last Pap 2011, always normal, no need for repeat Last MGM in 2011, no concerns, declines for now Follows with Dr Stanford Breed of cardiology     Past Surgical History  Procedure Laterality Date  . Tonsillectomy  1952  . Dilation and curettage of uterus    . Nasal polyps removed      age 81    Family History  Problem Relation Age of Onset  . Diabetes Mother     deceased secondary to diabetes  . Hypertension Mother   . Vision loss Mother   . Pneumonia Father     died age 74 due to complications of pneumonia  . Colon polyps Father   . Liver disease Daughter   . Sarcoidosis Daughter   . Diabetes Sister   . Other      no obvious premature cardiovascular disease or familial cardiomyopathy is noted  . Colon cancer Brother 37  . Cancer Brother   . Obesity Son   . Alcohol abuse Neg Hx   . Diabetes Brother   . Kidney disease Brother   . Diabetes Sister   . Sarcoidosis Sister   . Arthritis Sister   . Arthritis Sister   . Diabetes Sister   . Arthritis Sister   . Diabetes Sister   .  Hypertension Daughter   . Hypertension Daughter     Social History   Social History  . Marital Status: Married    Spouse Name: N/A  . Number of Children: N/A  . Years of Education: N/A   Occupational History  . Not on file.   Social History Main Topics  . Smoking status: Never Smoker   . Smokeless tobacco: Never Used     Comment: never used tobacco  . Alcohol Use: No  . Drug Use: No  . Sexual Activity: Not on file     Comment: lives with husband, no dietary restrictions, minimizes dairy   Other Topics Concern  . Not on file   Social History Narrative   The patient is married ( husband Uma Jerde)  She just recently celebrated     her 52nd anniversary.  She has five children.  There is no active     tobacco or alcohol use history.          Smoking Status:  never   Caffeine use/day:  None   Does Patient Exercise:  yes    Outpatient Prescriptions Prior to Visit  Medication Sig Dispense Refill  .  bisoprolol (ZEBETA) 10 MG tablet Take 1 tablet (10 mg total) by mouth daily. 30 tablet 2  . levothyroxine (SYNTHROID, LEVOTHROID) 25 MCG tablet take 1/2 tablet by mouth once daily 30 tablet 5  . Multiple Vitamins-Minerals (CENTRUM PO) Take by mouth daily.      . Omega-3 Fatty Acids (FISH OIL PO) Take 1 tablet by mouth daily.    . ciprofloxacin (CIPRO) 250 MG tablet Take 1 tablet (250 mg total) by mouth 2 (two) times daily. 6 tablet 0   No facility-administered medications prior to visit.    Allergies  Allergen Reactions  . Lisinopril Swelling    Facial and lip swelling  . Iohexol      Desc: hives,itiching     Review of Systems  Constitutional: Negative for fever and malaise/fatigue.  HENT: Negative for congestion.   Eyes: Negative for discharge.  Respiratory: Negative for shortness of breath.   Cardiovascular: Negative for chest pain, palpitations and leg swelling.  Gastrointestinal: Negative for nausea and abdominal pain.  Genitourinary: Negative for dysuria.    Musculoskeletal: Negative for falls.  Skin: Negative for rash.  Neurological: Negative for loss of consciousness and headaches.  Endo/Heme/Allergies: Negative for environmental allergies.  Psychiatric/Behavioral: Negative for depression. The patient is not nervous/anxious.        Objective:    Physical Exam  Constitutional: She is oriented to person, place, and time. She appears well-developed and well-nourished. No distress.  HENT:  Head: Normocephalic and atraumatic.  Nose: Nose normal.  Eyes: Right eye exhibits no discharge. Left eye exhibits no discharge.  Neck: Normal range of motion. Neck supple.  Cardiovascular: Normal rate and regular rhythm.   No murmur heard. Pulmonary/Chest: Effort normal and breath sounds normal.  Abdominal: Soft. Bowel sounds are normal. There is no tenderness.  Musculoskeletal: She exhibits no edema.  Neurological: She is alert and oriented to person, place, and time.  Skin: Skin is warm and dry.  Psychiatric: She has a normal mood and affect.  Nursing note and vitals reviewed.   BP 138/90 mmHg  Pulse 58  Temp(Src) 98 F (36.7 C) (Oral)  Ht '5\' 3"'$  (1.6 m)  Wt 139 lb 2 oz (63.107 kg)  BMI 24.65 kg/m2  SpO2 98% Wt Readings from Last 3 Encounters:  07/18/15 139 lb 2 oz (63.107 kg)  06/11/15 138 lb 12.8 oz (62.959 kg)  04/18/15 137 lb (62.143 kg)     Lab Results  Component Value Date   WBC 3.9* 06/11/2015   HGB 13.3 06/11/2015   HCT 39.9 06/11/2015   PLT 129.0* 06/11/2015   GLUCOSE 99 12/14/2014   CHOL 198 12/14/2014   TRIG 93.0 12/14/2014   HDL 58.90 12/14/2014   LDLCALC 121* 12/14/2014   ALT 33 12/14/2014   AST 30 12/14/2014   NA 138 12/14/2014   K 3.9 12/14/2014   CL 102 12/14/2014   CREATININE 0.99 12/14/2014   BUN 16 12/14/2014   CO2 30 12/14/2014   TSH 3.62 06/11/2015   INR 1.08 09/05/2009   HGBA1C 5.8 12/14/2014    Lab Results  Component Value Date   TSH 3.62 06/11/2015   Lab Results  Component Value Date    WBC 3.9* 06/11/2015   HGB 13.3 06/11/2015   HCT 39.9 06/11/2015   MCV 88.6 06/11/2015   PLT 129.0* 06/11/2015   Lab Results  Component Value Date   NA 138 12/14/2014   K 3.9 12/14/2014   CO2 30 12/14/2014   GLUCOSE 99 12/14/2014   BUN  16 12/14/2014   CREATININE 0.99 12/14/2014   BILITOT 0.6 12/14/2014   ALKPHOS 143* 12/14/2014   AST 30 12/14/2014   ALT 33 12/14/2014   PROT 8.8* 12/14/2014   ALBUMIN 3.9 12/14/2014   CALCIUM 10.0 12/14/2014   GFR 69.59 12/14/2014   Lab Results  Component Value Date   CHOL 198 12/14/2014   Lab Results  Component Value Date   HDL 58.90 12/14/2014   Lab Results  Component Value Date   LDLCALC 121* 12/14/2014   Lab Results  Component Value Date   TRIG 93.0 12/14/2014   Lab Results  Component Value Date   CHOLHDL 3 12/14/2014   Lab Results  Component Value Date   HGBA1C 5.8 12/14/2014       Assessment & Plan:   Essential hypertension Well controlled, no changes to meds. Encouraged heart healthy diet such as the DASH diet and exercise as tolerated.   Hyperglycemia hgba1c acceptable, minimize simple carbs. Increase exercise as tolerated. Continue current meds  History of angioedema No more episodes since stopping Lisinopril  Thrombocytopenia Mild, stable, asymptomatic   I have discontinued Ms. Sockwell's ciprofloxacin. I am also having her maintain her Multiple Vitamins-Minerals (CENTRUM PO), Omega-3 Fatty Acids (FISH OIL PO), levothyroxine, and bisoprolol.  No orders of the defined types were placed in this encounter.     Penni Homans, MD

## 2015-07-18 NOTE — Assessment & Plan Note (Signed)
hgba1c acceptable, minimize simple carbs. Increase exercise as tolerated. Continue current meds 

## 2015-07-18 NOTE — Progress Notes (Signed)
Pre visit review using our clinic review tool, if applicable. No additional management support is needed unless otherwise documented below in the visit note. 

## 2015-07-18 NOTE — Patient Instructions (Signed)
Melatonin 2-10 mg prior to bedtime.  Encouraged good sleep hygiene such as dark, quiet room. No blue/green glowing lights such as computer screens in bedroom. No alcohol or stimulants in evening. Cut down on caffeine as able. Regular exercise is helpful but not just prior to bed time.   Insomnia Insomnia is frequent trouble falling and/or staying asleep. Insomnia can be a long term problem or a short term problem. Both are common. Insomnia can be a short term problem when the wakefulness is related to a certain stress or worry. Long term insomnia is often related to ongoing stress during waking hours and/or poor sleeping habits. Overtime, sleep deprivation itself can make the problem worse. Every little thing feels more severe because you are overtired and your ability to cope is decreased. CAUSES   Stress, anxiety, and depression.  Poor sleeping habits.  Distractions such as TV in the bedroom.  Naps close to bedtime.  Engaging in emotionally charged conversations before bed.  Technical reading before sleep.  Alcohol and other sedatives. They may make the problem worse. They can hurt normal sleep patterns and normal dream activity.  Stimulants such as caffeine for several hours prior to bedtime.  Pain syndromes and shortness of breath can cause insomnia.  Exercise late at night.  Changing time zones may cause sleeping problems (jet lag). It is sometimes helpful to have someone observe your sleeping patterns. They should look for periods of not breathing during the night (sleep apnea). They should also look to see how long those periods last. If you live alone or observers are uncertain, you can also be observed at a sleep clinic where your sleep patterns will be professionally monitored. Sleep apnea requires a checkup and treatment. Give your caregivers your medical history. Give your caregivers observations your family has made about your sleep.  SYMPTOMS   Not feeling rested in the  morning.  Anxiety and restlessness at bedtime.  Difficulty falling and staying asleep. TREATMENT   Your caregiver may prescribe treatment for an underlying medical disorders. Your caregiver can give advice or help if you are using alcohol or other drugs for self-medication. Treatment of underlying problems will usually eliminate insomnia problems.  Medications can be prescribed for short time use. They are generally not recommended for lengthy use.  Over-the-counter sleep medicines are not recommended for lengthy use. They can be habit forming.  You can promote easier sleeping by making lifestyle changes such as:  Using relaxation techniques that help with breathing and reduce muscle tension.  Exercising earlier in the day.  Changing your diet and the time of your last meal. No night time snacks.  Establish a regular time to go to bed.  Counseling can help with stressful problems and worry.  Soothing music and white noise may be helpful if there are background noises you cannot remove.  Stop tedious detailed work at least one hour before bedtime. HOME CARE INSTRUCTIONS   Keep a diary. Inform your caregiver about your progress. This includes any medication side effects. See your caregiver regularly. Take note of:  Times when you are asleep.  Times when you are awake during the night.  The quality of your sleep.  How you feel the next day. This information will help your caregiver care for you.  Get out of bed if you are still awake after 15 minutes. Read or do some quiet activity. Keep the lights down. Wait until you feel sleepy and go back to bed.  Keep regular sleeping and  waking hours. Avoid naps.  Exercise regularly.  Avoid distractions at bedtime. Distractions include watching television or engaging in any intense or detailed activity like attempting to balance the household checkbook.  Develop a bedtime ritual. Keep a familiar routine of bathing, brushing your  teeth, climbing into bed at the same time each night, listening to soothing music. Routines increase the success of falling to sleep faster.  Use relaxation techniques. This can be using breathing and muscle tension release routines. It can also include visualizing peaceful scenes. You can also help control troubling or intruding thoughts by keeping your mind occupied with boring or repetitive thoughts like the old concept of counting sheep. You can make it more creative like imagining planting one beautiful flower after another in your backyard garden.  During your day, work to eliminate stress. When this is not possible use some of the previous suggestions to help reduce the anxiety that accompanies stressful situations. MAKE SURE YOU:   Understand these instructions.  Will watch your condition.  Will get help right away if you are not doing well or get worse. Document Released: 10/30/2000 Document Revised: 01/25/2012 Document Reviewed: 11/30/2007 [facility] Patient Information 2015 [place], [place]. This information is not intended to replace advice given to you by your health care provider. Make sure you discuss any questions you have with your health care provider.

## 2015-07-18 NOTE — Assessment & Plan Note (Signed)
Mild, stable, asymptomatic

## 2015-07-18 NOTE — Assessment & Plan Note (Signed)
No more episodes since stopping Lisinopril

## 2015-08-04 NOTE — Assessment & Plan Note (Signed)
On Levothyroxine, continue to monitor 

## 2015-09-05 ENCOUNTER — Other Ambulatory Visit: Payer: Self-pay | Admitting: Family

## 2015-10-17 ENCOUNTER — Ambulatory Visit (INDEPENDENT_AMBULATORY_CARE_PROVIDER_SITE_OTHER): Payer: Medicare Other | Admitting: Family Medicine

## 2015-10-17 ENCOUNTER — Encounter: Payer: Self-pay | Admitting: Family Medicine

## 2015-10-17 VITALS — BP 120/88 | HR 70 | Temp 97.7°F | Ht 63.0 in | Wt 136.4 lb

## 2015-10-17 DIAGNOSIS — E039 Hypothyroidism, unspecified: Secondary | ICD-10-CM | POA: Diagnosis not present

## 2015-10-17 DIAGNOSIS — I5041 Acute combined systolic (congestive) and diastolic (congestive) heart failure: Secondary | ICD-10-CM | POA: Diagnosis not present

## 2015-10-17 DIAGNOSIS — R739 Hyperglycemia, unspecified: Secondary | ICD-10-CM | POA: Diagnosis not present

## 2015-10-17 DIAGNOSIS — D696 Thrombocytopenia, unspecified: Secondary | ICD-10-CM | POA: Diagnosis not present

## 2015-10-17 DIAGNOSIS — E782 Mixed hyperlipidemia: Secondary | ICD-10-CM | POA: Insufficient documentation

## 2015-10-17 DIAGNOSIS — I10 Essential (primary) hypertension: Secondary | ICD-10-CM

## 2015-10-17 DIAGNOSIS — E559 Vitamin D deficiency, unspecified: Secondary | ICD-10-CM | POA: Diagnosis not present

## 2015-10-17 HISTORY — DX: Vitamin D deficiency, unspecified: E55.9

## 2015-10-17 NOTE — Progress Notes (Signed)
Pre visit review using our clinic review tool, if applicable. No additional management support is needed unless otherwise documented below in the visit note. 

## 2015-10-17 NOTE — Patient Instructions (Addendum)
Vitamin D 1000 to 2000 IU daily in capsule   Diet and Congestive Heart Failure Congestive heart failure (CHF) occurs when the heart does not pump as well as it once did. Many diseases lead to CHF, such as high blood pressure and diabetes.  The heart does not have to work as hard when you make some changes in your diet. If you eat too much salt or drink too much fluid, your body's water content may increase and make your heart work harder. This can worsen your CHF. The following diet will help decrease some of your symptoms.  Please consider letting us refer you to a nutritionist for diet education and guidance. It is sometimes very surprising how much salt can be hidden in packaged foods and at restaurants.  Reduce the Salt in Your Diet Even if you crave salt you can learn to like foods that are lower in salt. Your taste buds will change soon, and you will not miss the salt. Removing salt can bring out flavors that may have been hidden by the salt. Reduce the salt content in your diet by trying the following suggestions:  1. Choose plenty of fresh fruits and vegetables. They contain only small amounts of salt. Choose foods that are low in salt, such as fresh meats, poultry, fish, dry and fresh legumes, eggs, milk and yogurt. Plain rice, pasta and oatmeal are good low-sodium choices. However, the sodium content can increase if salt or other high-sodium ingredients are added during their preparation.  2. Season with herbs, spices, herbed vinegar and fruit juices. Avoid herb or spice mixtures that contain salt or sodium. Use lemon juice or fresh ground pepper to accent natural flavors. Try orange or pineapple juice as a base for meat marinades.   3.Read food labels before you buy packaged foods. Check the nutrition facts on the label for sodium content per serving. Find out the number of servings in the package. How does the sodium in each serving compare to the total sodium you can eat each day? Try  to pick packaged foods with a sodium content less than 350 milligrams for each serving. It is also useful to check the list of ingredients. If salt or sodium is listed in the first five ingredients, it is too high in sodium.  When Checking Labels: Use the nutrition information included on packaged foods. Be sure to notice the number of servings per container. Here are tips for using this information.  1. Nutrient List - The list covers nutrients most important to your health.  2.% Daily Value - This number shows how foods meet recommended nutrient intake levels for a 2,000 calorie reference diet. Try to eat no more than 100 percent of total fat, cholesterol and sodium.  3. Daily Values Footnote - Some food labels list daily values for 2,000 and 2,500 calorie daily diets.  4. Calories Per Gram Footnote - Some labels give the approximate number of calories in a gram of fat, carbohydrate and protein.  5.Sodium Content - Always check the sodium content. Look for foods with a sodium content less than 350 milligrams for each serving.  When Cooking or Preparing Food: 1.Shake the habit. Remove the salt shaker from the kitchen counter and table. A 1/8 teaspoon "salt shake" adds more than 250 milligrams of sodium to your dish.  2.Be creative. Instead of adding salt, spark up the flavor with herbs and spices, garlic, onions and citrus juices.  3.Be a low-salt cook. In most recipes, you can  cut back on salt by 50 percent or even eliminate it altogether. You can bake, broil, grill, roast, poach, steam or microwave foods without salt. Skip the urge to add salt to cooking water for pasta, rice, cereal and vegetables. It is an easy way to cut back on sodium.  4.Be careful with condiments. High-sodium condiments include various flavored salts, lemon pepper, garlic salt, onion salt, meat tenderizers, flavor enhancers, bouillon cubes, catsup, mustard, steak sauce and soy sauce.  5.Stay away from hidden salt.  Canned and processed foods, such as gravies, instant cereal, packaged noodles and potato mixes, olives, pickles, soups and vegetables are high in salt. Choose the frozen item instead; or better yet, choose fresh foods when you can. Cheeses, cured meats (such as bacon, bologna, hot dogs and sausages), fast foods and frozen foods also may contain a lot of sodium.  When Eating Out: A low-sodium diet does not need to spoil the pleasure of a restaurant meal. However, you will have to be careful when ordering. Here are some tips for meals away from home:  1.Move the salt shaker to another table. Ask for a lemon wedge or bring your own herb blend to enhance the food's flavor.  2.Recognize menu terms that may indicate a high sodium content: pickled, au jus, soy sauce or in broth.  3.Select raw vegetables or fresh fruit rather than salty snacks.  4.Go easy on condiments such as mustard, catsup, pickles and tartar sauce. Choose lettuce, onions and tomatoes. Remember that bacon and cheeses are high in sodium.  5.Request that the cook prepare foods without adding salt or MSG. Or ask for sauces and salad dressings on the side since they are often high in sodium. For a salad, use a twist of lemon, a splash of vinegar or a light drizzle of dressing

## 2015-10-17 NOTE — Progress Notes (Signed)
Crystal [name] [id number] 09-21-1934 10/17/2015      Patient Progress Note   Subjective  Chief Complaint  Chief Complaint  Patient presents with  . Follow-up    HPI  81 year old female presents for 3 month follow up.  She is overall doing well. She had one episode of shortness of breath but it was during hiking and it was very windy at the time. She denies any shortness of breath during the day or at night when she lays down. She has increased her exercise and is doing daily walking and feels well overall. She is being seen yearly for her vision and denies any changes. Seeing a cardiologist yearly and has no current issues.  Patient denies shortness of breath, chest pain,changes in urination, GI issues, recent fevers or illnesses    Past Medical History  Diagnosis Date  . Cardiomyopathy     non ischemic NL cos on cath 08/2009, EF to 15-20%, Her follow up  2D echo  in 10/2009 showed impoved EFo 35-40%  . CHF (congestive heart failure)   . Hypertension   . Anxiety   . History of lymphoma     Dr Martha Clan  . Hypothyroid   . Medicare annual wellness visit, subsequent 12/14/2014    Follows with Dr Marin Olp Follows with Dr Deatra Ina, gastroenterology, last colonoscopy in 2012, repeat in 2017 Last Pap 2011, always normal, no need for repeat Last MGM in 2011, no concerns, declines for now Follows with Dr Stanford Breed of cardiology     Past Surgical History  Procedure Laterality Date  . Tonsillectomy  1952  . Dilation and curettage of uterus    . Nasal polyps removed      age 57    Family History  Problem Relation Age of Onset  . Diabetes Mother     deceased secondary to diabetes  . Hypertension Mother   . Vision loss Mother   . Pneumonia Father     died age 58 due to complications of pneumonia  . Colon polyps Father   . Liver disease Daughter   . Sarcoidosis Daughter   . Diabetes Sister   . Other      no obvious premature cardiovascular disease or familial cardiomyopathy is noted   . Colon cancer Brother 45  . Cancer Brother   . Obesity Son   . Alcohol abuse Neg Hx   . Diabetes Brother   . Kidney disease Brother   . Diabetes Sister   . Sarcoidosis Sister   . Arthritis Sister   . Arthritis Sister   . Diabetes Sister   . Arthritis Sister   . Diabetes Sister   . Hypertension Daughter   . Hypertension Daughter     Social History   Social History  . Marital Status: Married    Spouse Name: N/A  . Number of Children: N/A  . Years of Education: N/A   Occupational History  . Not on file.   Social History Main Topics  . Smoking status: Never Smoker   . Smokeless tobacco: Never Used     Comment: never used tobacco  . Alcohol Use: No  . Drug Use: No  . Sexual Activity: Not on file     Comment: lives with husband, no dietary restrictions, minimizes dairy   Other Topics Concern  . Not on file   Social History Narrative   The patient is married ( husband Majesta Leichter)  She just recently celebrated     her 52nd  anniversary.  She has five children.  There is no active     tobacco or alcohol use history.          Smoking Status:  never   Caffeine use/day:  None   Does Patient Exercise:  yes    Current Outpatient Prescriptions on File Prior to Visit  Medication Sig Dispense Refill  . bisoprolol (ZEBETA) 10 MG tablet take 1 tablet by mouth once daily 30 tablet 2  . levothyroxine (SYNTHROID, LEVOTHROID) 25 MCG tablet take 1/2 tablet by mouth once daily 30 tablet 5  . Multiple Vitamins-Minerals (CENTRUM PO) Take by mouth daily.      . Omega-3 Fatty Acids (FISH OIL PO) Take 1 tablet by mouth daily.     No current facility-administered medications on file prior to visit.    Allergies  Allergen Reactions  . Lisinopril Swelling    Facial and lip swelling  . Iohexol      Desc: hives,itiching     Review of Systems  Constitutional: Negative for fever and malaise/fatigue.  HENT: Negative for congestion.  Eyes: Negative for discharge.  Respiratory:  Negative for shortness of breath.  Cardiovascular: Negative for chest pain, palpitations and leg swelling.  Gastrointestinal: Negative for nausea, abdominal pain and diarrhea.  Genitourinary: Negative for dysuria and urgency, hematuria and flank pain.  Musculoskeletal: Negative for myalgias and falls.  Skin: Negative for rash.  Neurological: Negative for loss of consciousness and headaches.  Endo/Heme/Allergies: Negative for polydipsia.  Psychiatric/Behavioral: Negative for depression and suicidal ideas. The patient is not nervous/anxious and does not have insomnia.   Objective  There were no vitals taken for this visit.  Physical Exam  Constitutional: Oriented to person, place, and time. Appears well-nourished. No distress.  Eyes: EOM are normal. Pupils are equal, round, and reactive to light.  Cardiovascular: Normal rate and regular rhythm.  Pulmonary/Chest: Breath sounds normal.  Abdominal: Soft. Bowel sounds are normal.  Lymphadenopathy:   No cervical adenopathy.  Neurological: Alert and oriented to person, place, and time. Normal reflexes. No cranial nerve deficit.   Assessment & Plan  Prediabetic -HgA1c acceptable -Minimize simple carbs, increase exercise as tolerated  Essential hypertension -Well controlled, no changes to meds. -Encouraged heart healthy diet and exercise as tolerated.   Thrombocytopenia -Continue to monitor, report any concerns.   Hypothyroidism -On Levothyroxine -TSH level to see current status  Congestive Heart Failure -Patient asymptomatic at this time -Recommend seeing nutritionist to help with heart healthy diet -Continue current meds and seeing cardiologist  Lab Work Ordered -CBC, CMP, Lipid panel, TSH, A1c  Annual Physical Exam -Patient encouraged to maintain heart healthy diet, regular exercise, adequate sleep. -Consider daily probiotics. Take medications as prescribed. Labs ordered

## 2015-10-18 LAB — COMPREHENSIVE METABOLIC PANEL
ALBUMIN: 3.8 g/dL (ref 3.5–5.2)
ALT: 36 U/L — ABNORMAL HIGH (ref 0–35)
AST: 33 U/L (ref 0–37)
Alkaline Phosphatase: 141 U/L — ABNORMAL HIGH (ref 39–117)
BUN: 14 mg/dL (ref 6–23)
CHLORIDE: 100 meq/L (ref 96–112)
CO2: 30 mEq/L (ref 19–32)
CREATININE: 1.11 mg/dL (ref 0.40–1.20)
Calcium: 10.4 mg/dL (ref 8.4–10.5)
GFR: 60.85 mL/min (ref >60.00)
GLUCOSE: 83 mg/dL (ref 70–99)
Potassium: 4.7 mEq/L (ref 3.5–5.1)
SODIUM: 137 meq/L (ref 135–145)
TOTAL PROTEIN: 9.1 g/dL — AB (ref 6.0–8.3)
Total Bilirubin: 0.8 mg/dL (ref 0.2–1.2)

## 2015-10-18 LAB — TSH: TSH: 4.14 u[IU]/mL (ref 0.35–4.50)

## 2015-10-18 LAB — CBC
HEMATOCRIT: 43 % (ref 36.0–46.0)
Hemoglobin: 14.2 g/dL (ref 12.0–15.0)
MCHC: 33.1 g/dL (ref 30.0–36.0)
MCV: 88.6 fl (ref 78.0–100.0)
Platelets: 152 10*3/uL (ref 150.0–400.0)
RBC: 4.86 Mil/uL (ref 3.87–5.11)
RDW: 13.1 % (ref 11.5–15.5)
WBC: 4.8 10*3/uL (ref 4.0–10.5)

## 2015-10-18 LAB — LIPID PANEL
CHOLESTEROL: 201 mg/dL — AB (ref 0–200)
HDL: 53.7 mg/dL (ref >39.00)
LDL CALC: 128 mg/dL — AB (ref 0–99)
NONHDL: 147.24
Total CHOL/HDL Ratio: 4
Triglycerides: 98 mg/dL (ref 0.0–149.0)
VLDL: 19.6 mg/dL (ref 0.0–40.0)

## 2015-10-18 LAB — VITAMIN D 25 HYDROXY (VIT D DEFICIENCY, FRACTURES): VITD: 38.86 ng/mL (ref 30.00–100.00)

## 2015-10-18 LAB — HEMOGLOBIN A1C: HEMOGLOBIN A1C: 5.7 % (ref 4.6–6.5)

## 2015-10-20 NOTE — Progress Notes (Signed)
Subjective:    Patient ID: Crystal [name], female    DOB: March 07, 1934, 81 y.o.   MRN: [id number]  Chief Complaint  Patient presents with  . Follow-up    HPI Patient is in today for follow-up. Generally doing well but does endorse some issues with shortness of breath with exertion while hiking in the woods. This has not recurred. No waking qhs SOB. No recent illness otherwise. No recent hospitalization. Is only marginally following a heart healthy diet. Denies CP/palp/HA/congestion/fevers/GI or GU c/o. Taking meds as prescribed  Past Medical History  Diagnosis Date  . Cardiomyopathy     non ischemic NL cos on cath 08/2009, EF to 15-20%, Her follow up  2D echo  in 10/2009 showed impoved EFo 35-40%  . CHF (congestive heart failure) ([place])   . Hypertension   . Anxiety   . History of lymphoma     Dr Martha Clan  . Hypothyroid   . Medicare annual wellness visit, subsequent 12/14/2014    Follows with Dr Marin Olp Follows with Dr Deatra Ina, gastroenterology, last colonoscopy in 2012, repeat in 2017 Last Pap 2011, always normal, no need for repeat Last MGM in 2011, no concerns, declines for now Follows with Dr Stanford Breed of cardiology   . Vitamin D deficiency 10/17/2015    Past Surgical History  Procedure Laterality Date  . Tonsillectomy  1952  . Dilation and curettage of uterus    . Nasal polyps removed      age 83    Family History  Problem Relation Age of Onset  . Diabetes Mother     deceased secondary to diabetes  . Hypertension Mother   . Vision loss Mother   . Pneumonia Father     died age 77 due to complications of pneumonia  . Colon polyps Father   . Liver disease Daughter   . Sarcoidosis Daughter   . Diabetes Sister   . Other      no obvious premature cardiovascular disease or familial cardiomyopathy is noted  . Colon cancer Brother 89  . Cancer Brother   . Obesity Son   . Alcohol abuse Neg Hx   . Diabetes Brother   . Kidney disease Brother   . Diabetes Sister   .  Sarcoidosis Sister   . Arthritis Sister   . Arthritis Sister   . Diabetes Sister   . Arthritis Sister   . Diabetes Sister   . Hypertension Daughter   . Hypertension Daughter     Social History   Social History  . Marital Status: Married    Spouse Name: N/A  . Number of Children: N/A  . Years of Education: N/A   Occupational History  . Not on file.   Social History Main Topics  . Smoking status: Never Smoker   . Smokeless tobacco: Never Used     Comment: never used tobacco  . Alcohol Use: No  . Drug Use: No  . Sexual Activity: Not on file     Comment: lives with husband, no dietary restrictions, minimizes dairy   Other Topics Concern  . Not on file   Social History Narrative   The patient is married ( husband Cypress Hinkson)  She just recently celebrated     her 52nd anniversary.  She has five children.  There is no active     tobacco or alcohol use history.          Smoking Status:  never   Caffeine use/day:  None  Does Patient Exercise:  yes    Outpatient Prescriptions Prior to Visit  Medication Sig Dispense Refill  . bisoprolol (ZEBETA) 10 MG tablet take 1 tablet by mouth once daily 30 tablet 2  . levothyroxine (SYNTHROID, LEVOTHROID) 25 MCG tablet take 1/2 tablet by mouth once daily 30 tablet 5  . Multiple Vitamins-Minerals (CENTRUM PO) Take by mouth daily.      . Omega-3 Fatty Acids (FISH OIL PO) Take 1 tablet by mouth daily.     No facility-administered medications prior to visit.    Allergies  Allergen Reactions  . Lisinopril Swelling    Facial and lip swelling  . Iohexol      Desc: hives,itiching     Review of Systems  Constitutional: Positive for malaise/fatigue. Negative for fever.  HENT: Negative for congestion.   Eyes: Negative for discharge.  Respiratory: Positive for shortness of breath.   Cardiovascular: Negative for chest pain, palpitations and leg swelling.  Gastrointestinal: Negative for nausea and abdominal pain.  Genitourinary:  Negative for dysuria.  Musculoskeletal: Negative for falls.  Skin: Negative for rash.  Neurological: Negative for loss of consciousness and headaches.  Endo/Heme/Allergies: Negative for environmental allergies.  Psychiatric/Behavioral: Negative for depression. The patient is not nervous/anxious.        Objective:    Physical Exam  Constitutional: She is oriented to person, place, and time. She appears well-developed and well-nourished. No distress.  HENT:  Head: Normocephalic and atraumatic.  Nose: Nose normal.  Eyes: Right eye exhibits no discharge. Left eye exhibits no discharge.  Neck: Normal range of motion. Neck supple.  Cardiovascular: Normal rate and regular rhythm.   No murmur heard. Pulmonary/Chest: Effort normal and breath sounds normal.  Abdominal: Soft. Bowel sounds are normal. There is no tenderness.  Musculoskeletal: She exhibits no edema.  Neurological: She is alert and oriented to person, place, and time.  Skin: Skin is warm and dry.  Psychiatric: She has a normal mood and affect.  Nursing note and vitals reviewed.   BP 120/88 mmHg  Pulse 70  Temp(Src) 97.7 F (36.5 C) (Oral)  Ht '5\' 3"'$  (1.6 m)  Wt 136 lb 6 oz (61.859 kg)  BMI 24.16 kg/m2  SpO2 99% Wt Readings from Last 3 Encounters:  10/17/15 136 lb 6 oz (61.859 kg)  07/18/15 139 lb 2 oz (63.107 kg)  06/11/15 138 lb 12.8 oz (62.959 kg)     Lab Results  Component Value Date   WBC 4.8 10/17/2015   HGB 14.2 10/17/2015   HCT 43.0 10/17/2015   PLT 152.0 10/17/2015   GLUCOSE 83 10/17/2015   CHOL 201* 10/17/2015   TRIG 98.0 10/17/2015   HDL 53.70 10/17/2015   LDLCALC 128* 10/17/2015   ALT 36* 10/17/2015   AST 33 10/17/2015   NA 137 10/17/2015   K 4.7 10/17/2015   CL 100 10/17/2015   CREATININE 1.11 10/17/2015   BUN 14 10/17/2015   CO2 30 10/17/2015   TSH 4.14 10/17/2015   INR 1.08 09/05/2009   HGBA1C 5.7 10/17/2015    Lab Results  Component Value Date   TSH 4.14 10/17/2015   Lab Results   Component Value Date   WBC 4.8 10/17/2015   HGB 14.2 10/17/2015   HCT 43.0 10/17/2015   MCV 88.6 10/17/2015   PLT 152.0 10/17/2015   Lab Results  Component Value Date   NA 137 10/17/2015   K 4.7 10/17/2015   CO2 30 10/17/2015   GLUCOSE 83 10/17/2015   BUN 14 10/17/2015  CREATININE 1.11 10/17/2015   BILITOT 0.8 10/17/2015   ALKPHOS 141* 10/17/2015   AST 33 10/17/2015   ALT 36* 10/17/2015   PROT 9.1* 10/17/2015   ALBUMIN 3.8 10/17/2015   CALCIUM 10.4 10/17/2015   GFR 60.85 10/17/2015   Lab Results  Component Value Date   CHOL 201* 10/17/2015   Lab Results  Component Value Date   HDL 53.70 10/17/2015   Lab Results  Component Value Date   LDLCALC 128* 10/17/2015   Lab Results  Component Value Date   TRIG 98.0 10/17/2015   Lab Results  Component Value Date   CHOLHDL 4 10/17/2015   Lab Results  Component Value Date   HGBA1C 5.7 10/17/2015       Assessment & Plan:   Problem List Items Addressed This Visit    Congestive heart failure ([place]) - Primary    Discussed diet and lifestyle concerns with patient. Offered nutrition consult for further management.       Relevant Orders   Amb ref to Medical Nutrition Therapy-MNT   TSH (Completed)   CBC (Completed)   Lipid panel (Completed)   Comprehensive metabolic panel (Completed)   Hemoglobin A1c (Completed)   Essential hypertension    Well controlled, no changes to meds. Encouraged heart healthy diet such as the DASH diet and exercise as tolerated.       Hyperglycemia    hgba1c acceptable, minimize simple carbs. Increase exercise as tolerated.       Relevant Orders   TSH (Completed)   CBC (Completed)   Lipid panel (Completed)   Comprehensive metabolic panel (Completed)   Hemoglobin A1c (Completed)   Hyperlipidemia, mixed    Encouraged heart healthy diet, increase exercise, avoid trans fats, consider a krill oil cap daily      Relevant Orders   TSH (Completed)   CBC (Completed)   Lipid panel  (Completed)   Comprehensive metabolic panel (Completed)   Hemoglobin A1c (Completed)   Hypothyroidism    On Levothyroxine, continue to monitor      Relevant Orders   TSH (Completed)   CBC (Completed)   Lipid panel (Completed)   Comprehensive metabolic panel (Completed)   Hemoglobin A1c (Completed)   Thrombocytopenia (H[facility])    Resolved with recent lab      Relevant Orders   TSH (Completed)   CBC (Completed)   Lipid panel (Completed)   Comprehensive metabolic panel (Completed)   Hemoglobin A1c (Completed)   Vitamin D deficiency    Well treated      Relevant Orders   Vitamin D (25 hydroxy) (Completed)      I am having Ms. Mavity maintain her Multiple Vitamins-Minerals (CENTRUM PO), Omega-3 Fatty Acids (FISH OIL PO), levothyroxine, and bisoprolol.  No orders of the defined types were placed in this encounter.     Penni Homans, MD

## 2015-10-20 NOTE — Assessment & Plan Note (Signed)
Resolved with recent lab

## 2015-10-20 NOTE — Assessment & Plan Note (Signed)
Discussed diet and lifestyle concerns with patient. Offered nutrition consult for further management.

## 2015-10-20 NOTE — Assessment & Plan Note (Signed)
Encouraged heart healthy diet, increase exercise, avoid trans fats, consider a krill oil cap daily 

## 2015-10-20 NOTE — Assessment & Plan Note (Signed)
Well treated 

## 2015-10-20 NOTE — Assessment & Plan Note (Signed)
Well controlled, no changes to meds. Encouraged heart healthy diet such as the DASH diet and exercise as tolerated.  °

## 2015-10-20 NOTE — Assessment & Plan Note (Signed)
On Levothyroxine, continue to monitor 

## 2015-10-20 NOTE — Assessment & Plan Note (Signed)
hgba1c acceptable, minimize simple carbs. Increase exercise as tolerated.  

## 2015-11-13 ENCOUNTER — Encounter: Payer: Self-pay | Admitting: *Deleted

## 2015-11-15 ENCOUNTER — Ambulatory Visit (INDEPENDENT_AMBULATORY_CARE_PROVIDER_SITE_OTHER): Payer: Medicare Other

## 2015-11-15 ENCOUNTER — Ambulatory Visit: Payer: Medicare Other | Admitting: Physician Assistant

## 2015-11-15 ENCOUNTER — Telehealth: Payer: Self-pay | Admitting: Cardiology

## 2015-11-15 ENCOUNTER — Ambulatory Visit (INDEPENDENT_AMBULATORY_CARE_PROVIDER_SITE_OTHER): Payer: Medicare Other | Admitting: Cardiovascular Disease

## 2015-11-15 VITALS — BP 140/82 | HR 93 | Ht 63.0 in | Wt 137.6 lb

## 2015-11-15 DIAGNOSIS — E785 Hyperlipidemia, unspecified: Secondary | ICD-10-CM

## 2015-11-15 DIAGNOSIS — R0609 Other forms of dyspnea: Secondary | ICD-10-CM

## 2015-11-15 DIAGNOSIS — I10 Essential (primary) hypertension: Secondary | ICD-10-CM

## 2015-11-15 DIAGNOSIS — I5023 Acute on chronic systolic (congestive) heart failure: Secondary | ICD-10-CM

## 2015-11-15 MED ORDER — FUROSEMIDE 20 MG PO TABS: 20.0000 mg | ORAL_TABLET | Freq: Every day | ORAL | Status: DC

## 2015-11-15 NOTE — Telephone Encounter (Signed)
Mrs. Gasser is calling because she has been having shortness of breath , coughing and fatique. Please call   Thanks

## 2015-11-15 NOTE — Telephone Encounter (Signed)
Pt called in with increase SOB on exertion over the past 24 hours.  Pt stated this started a few months ago but over the past 24 hours it is getting much worse.  She has not been able to rest as she is having a none productive cough. Pt has no c/o of chest pain, pressure, dizziness, or syncope. Pt has taken medications as scheduled and has not checked her BP lately Pt stated that she has noticed that her shoes are tight but does not seem to have swelling any where else.  Pt scheduled for Nurse visit for EKG today at 11am.

## 2015-11-15 NOTE — Telephone Encounter (Signed)
[name]  

## 2015-11-15 NOTE — Progress Notes (Signed)
Cardiology Office Note   Date:  11/16/2015   ID:  Crystal [name], DOB Feb 10, 1934, MRN [id number]  PCP:  Penni Homans, MD  Cardiologist:   Sharol Harness, MD   Chief Complaint  Patient presents with  . Shortness of Breath      History of Present Illness: Crystal [name] is a 81 y.o. female with chronic systolic heart failure (LVEF 15-20% in 2010, 45-50% 07/2013), hypertension, and hypothyroidism who presents for an evaluation of dyspnea.  Crystal [name] is a patient of Dr. Stanford Breed with non-ischemic cardiomyopathy.  She was diagnosed with heart failure in 2010, at which time she underwent cardiac catheterization that revealed no significant coronary artery disease.  Her ejection fraction subsequently improved to 45-50% in 2014.  She has been doing fairly well into the last several months, at which time she started to notice exertional dyspnea. She is an avid hiker and has noticed limitations in her ability to height. Over the last several days to become much more significantly short of breath. She endorses orthopnea and has been unable to sleep in her bed at night. She denies any lower extremity edema or chest pain. She has noted a several pound weight gain over the last couple weeks.  She called the clinic regarding these symptoms and was asked to come in for an EKG.  She was noted to be dyspneic on exertion and her oxygen saturation decreased to 87% with ambulation. As the doctor of the day I was asked to evaluate her.   Past Medical History  Diagnosis Date  . Cardiomyopathy     non ischemic NL cos on cath 08/2009, EF to 15-20%, Her follow up  2D echo  in 10/2009 showed impoved EFo 35-40%  . CHF (congestive heart failure) ([place])   . Hypertension   . Anxiety   . History of lymphoma     Dr Martha Clan  . Hypothyroid   . Medicare annual wellness visit, subsequent 12/14/2014    Follows with Dr Marin Olp Follows with Dr Deatra Ina, gastroenterology, last colonoscopy in 2012, repeat in 2017 Last Pap  2011, always normal, no need for repeat Last MGM in 2011, no concerns, declines for now Follows with Dr Stanford Breed of cardiology   . Vitamin D deficiency 10/17/2015    Past Surgical History  Procedure Laterality Date  . Tonsillectomy  1952  . Dilation and curettage of uterus    . Nasal polyps removed      age 53     Current Outpatient Prescriptions  Medication Sig Dispense Refill  . bisoprolol (ZEBETA) 10 MG tablet take 1 tablet by mouth once daily 30 tablet 2  . furosemide (LASIX) 20 MG tablet Take 1 tablet (20 mg total) by mouth daily. 90 tablet 3  . levothyroxine (SYNTHROID, LEVOTHROID) 25 MCG tablet take 1/2 tablet by mouth once daily 30 tablet 5  . Multiple Vitamins-Minerals (CENTRUM PO) Take by mouth daily.      . Omega-3 Fatty Acids (FISH OIL PO) Take 1 tablet by mouth daily.     No current facility-administered medications for this visit.    Allergies:   Lisinopril and Iohexol    Social History:  The patient  reports that she has never smoked. She has never used smokeless tobacco. She reports that she does not drink alcohol or use illicit drugs.   Family History:  The patient's family history includes Arthritis in her sister, sister, and sister; Cancer in her brother; Colon cancer (age of onset: 31)  in her brother; Colon polyps in her father; Diabetes in her brother, mother, sister, sister, sister, and sister; Hypertension in her daughter, daughter, and mother; Kidney disease in her brother; Liver disease in her daughter; Obesity in her son; Pneumonia in her father; Sarcoidosis in her daughter and sister; Vision loss in her mother. There is no history of Alcohol abuse.    ROS:  Please see the history of present illness.   Otherwise, review of systems are positive for none.   All other systems are reviewed and negative.    PHYSICAL EXAM: VS:  BP 140/82 mmHg  Pulse 93  Ht '5\' 3"'$  (1.6 m)  Wt 62.415 kg (137 lb 9.6 oz)  BMI 24.38 kg/m2  SpO2 94% , BMI Body mass index is 24.38  kg/(m^2). GENERAL:  Well appearing.  Dyspneic with talking and laying at 45 degrees. HEENT:  Pupils equal round and reactive, fundi not visualized, oral mucosa unremarkable NECK:  JVP 4cm above clavicle at 45 degrees, waveform within normal limits, carotid upstroke brisk and symmetric, no bruits, no thyromegaly LYMPHATICS:  No cervical adenopathy LUNGS:  Clear to auscultation bilaterally HEART:  RRR.  PMI not displaced or sustained,S1 and S2 within normal limits, no S3, no S4, no clicks, no rubs, no murmurs ABD:  Flat, positive bowel sounds normal in frequency in pitch, no bruits, no rebound, no guarding, no midline pulsatile mass, no hepatomegaly, no splenomegaly EXT:  2 plus pulses throughout, no edema, no cyanosis no clubbing SKIN:  No rashes no nodules NEURO:  Cranial nerves II through XII grossly intact, motor grossly intact throughout PSYCH:  Cognitively intact, oriented to person place and time    EKG:  EKG is ordered today. The ekg ordered today demonstrates Sinus rhythm rate 86 bpm. PA-C with aberrant conduction., Fusion beat, left axis deviation. Left ventricular hypertrophy.   Recent Labs: 12/14/2014: Magnesium 1.7 10/17/2015: ALT 36*; BUN 14; Creatinine, Ser 1.11; Hemoglobin 14.2; Platelets 152.0; Potassium 4.7; Sodium 137; TSH 4.14    Lipid Panel    Component Value Date/Time   CHOL 201* 10/17/2015 1437   TRIG 98.0 10/17/2015 1437   HDL 53.70 10/17/2015 1437   CHOLHDL 4 10/17/2015 1437   VLDL 19.6 10/17/2015 1437   LDLCALC 128* 10/17/2015 1437      Wt Readings from Last 3 Encounters:  11/15/15 62.415 kg (137 lb 9.6 oz)  11/15/15 62.415 kg (137 lb 9.6 oz)  10/17/15 61.859 kg (136 lb 6 oz)      ASSESSMENT AND PLAN:  # Acute on chronic systolic heart failure:  I suspect that Crystal [name] has recurrent Her neck veins are elevated and she has classic orthopnea both on exam and by report. She appears to be mildly volume overloaded.  We will start Lasix 20 mg twice a day  for 2 days followed by 20 mg daily. We will obtain a repeat echocardiogram and have her followed up in clinic next week. We discussed the importance of presenting to the emergency department if her symptoms worsen.  # Hypertension:  Blood pressure slightly above goal.  Continue bisoprolol.  Angioedema with lisinopril, so no ACE-I or ARB.  Continue spironolactone, hydralazine/nitrates if LVEF is depressed.  # Hyperlipidemia: Continue fish oil.   Current medicines are reviewed at length with the patient today.  The patient does not have concerns regarding medicines.  The following changes have been made:  Start lasix 20 mg bid x2 days then 20 mg daily.    Labs/ tests ordered today include:  No orders of the defined types were placed in this encounter.     Disposition:   Follow up in clinic next week.   This note was written with the assistance of speech recognition software.  Please excuse any transcriptional errors.  Signed, Derron Pipkins C. Oval Linsey, MD, [facility]  11/16/2015 5:22 PM    [facility]

## 2015-11-15 NOTE — Patient Instructions (Signed)
Medication Instructions:  Your physician has recommended you make the following change in your medication:  1) START Lasix 20 mg by mouth TWICE daily for the next TWO days - Then take one 20 mg tablet of Lasix ONCE daily    Labwork: Your physician recommends that you return for lab work in: 1 week (bmet)  Testing/Procedures: Your physician has requested that you have an echocardiogram. Echocardiography is a painless test that uses sound waves to create images of your heart. It provides your doctor with information about the size and shape of your heart and how well your heart's chambers and valves are working. This procedure takes approximately one hour. There are no restrictions for this procedure.   Follow-Up: Your physician recommends that you schedule a follow-up appointment in: 1 week with Extender   Any Other Special Instructions Will Be Listed Below (If Applicable).     If you need a refill on your cardiac medications before your next appointment, please call your pharmacy.

## 2015-11-16 ENCOUNTER — Encounter: Payer: Self-pay | Admitting: Cardiovascular Disease

## 2015-11-21 ENCOUNTER — Ambulatory Visit (HOSPITAL_COMMUNITY): Payer: Medicare Other | Attending: Cardiovascular Disease

## 2015-11-21 ENCOUNTER — Other Ambulatory Visit (INDEPENDENT_AMBULATORY_CARE_PROVIDER_SITE_OTHER): Payer: Medicare Other | Admitting: *Deleted

## 2015-11-21 ENCOUNTER — Encounter: Payer: Self-pay | Admitting: Physician Assistant

## 2015-11-21 ENCOUNTER — Other Ambulatory Visit: Payer: Self-pay

## 2015-11-21 ENCOUNTER — Other Ambulatory Visit: Payer: Self-pay | Admitting: *Deleted

## 2015-11-21 ENCOUNTER — Ambulatory Visit (INDEPENDENT_AMBULATORY_CARE_PROVIDER_SITE_OTHER): Payer: Medicare Other | Admitting: Physician Assistant

## 2015-11-21 ENCOUNTER — Other Ambulatory Visit: Payer: Self-pay | Admitting: Physician Assistant

## 2015-11-21 ENCOUNTER — Encounter (HOSPITAL_COMMUNITY): Payer: Self-pay

## 2015-11-21 VITALS — BP 151/90 | HR 77 | Ht 63.0 in | Wt 136.8 lb

## 2015-11-21 DIAGNOSIS — R0602 Shortness of breath: Secondary | ICD-10-CM

## 2015-11-21 DIAGNOSIS — R0609 Other forms of dyspnea: Secondary | ICD-10-CM | POA: Insufficient documentation

## 2015-11-21 DIAGNOSIS — Z79899 Other long term (current) drug therapy: Secondary | ICD-10-CM | POA: Diagnosis not present

## 2015-11-21 DIAGNOSIS — E785 Hyperlipidemia, unspecified: Secondary | ICD-10-CM | POA: Insufficient documentation

## 2015-11-21 DIAGNOSIS — I272 Other secondary pulmonary hypertension: Secondary | ICD-10-CM | POA: Diagnosis not present

## 2015-11-21 DIAGNOSIS — J90 Pleural effusion, not elsewhere classified: Secondary | ICD-10-CM | POA: Diagnosis not present

## 2015-11-21 DIAGNOSIS — R0600 Dyspnea, unspecified: Secondary | ICD-10-CM | POA: Diagnosis present

## 2015-11-21 DIAGNOSIS — I10 Essential (primary) hypertension: Secondary | ICD-10-CM | POA: Insufficient documentation

## 2015-11-21 DIAGNOSIS — I340 Nonrheumatic mitral (valve) insufficiency: Secondary | ICD-10-CM | POA: Insufficient documentation

## 2015-11-21 LAB — BASIC METABOLIC PANEL
BUN: 16 mg/dL (ref 7–25)
CO2: 29 mmol/L (ref 20–31)
Calcium: 10 mg/dL (ref 8.6–10.4)
Chloride: 101 mmol/L (ref 98–110)
Creat: 1.04 mg/dL — ABNORMAL HIGH (ref 0.60–0.93)
GLUCOSE: 110 mg/dL — AB (ref 65–99)
POTASSIUM: 4 mmol/L (ref 3.5–5.3)
SODIUM: 138 mmol/L (ref 135–146)

## 2015-11-21 MED ORDER — ISOSORBIDE MONONITRATE ER 30 MG PO TB24: 15.0000 mg | ORAL_TABLET | Freq: Every day | ORAL | Status: DC

## 2015-11-21 MED ORDER — POTASSIUM CHLORIDE ER 20 MEQ PO TBCR: 20.0000 meq | EXTENDED_RELEASE_TABLET | Freq: Every day | ORAL | Status: DC

## 2015-11-21 MED ORDER — FUROSEMIDE 40 MG PO TABS: ORAL_TABLET | ORAL | Status: DC

## 2015-11-21 MED ORDER — HYDRALAZINE HCL 25 MG PO TABS: 25.0000 mg | ORAL_TABLET | Freq: Three times a day (TID) | ORAL | Status: DC

## 2015-11-21 NOTE — Patient Instructions (Addendum)
Medication Instructions:  Your physician has recommended you make the following change in your medication: + 1.  INCREASE the Lasix to 40 mg taking 1 tablet twice a day X's 3 days then take 1 tablet daily (you can take 2 20 mg tablets twice a day X's 3 days then take 2 daily to use them up) 2.  START Imdur 30 mg taking 1/2 tablet daily 3.  START Hydralazine 25 mg taking 1 tablet three times a day   Labwork: 12/05/15:  BMET  Testing/Procedures: None ordered  Follow-Up: Your physician recommends that you keep your scheduled follow-up appointment with Tarri Fuller 11/26/15   Any Other Special Instructions Will Be Listed Below (If Applicable).     If you need a refill on your cardiac medications before your next appointment, please call your pharmacy.

## 2015-11-21 NOTE — Progress Notes (Signed)
Cardiology Office Note   Date:  11/21/2015   ID:  CHARLETTE HENNINGS, DOB [birth year], MRN [id number]  PCP:  Penni Homans, MD  Cardiologist:  Dr. Stanford Breed  Chief Complaint  Patient presents with  . Other    echo review      History of Present Illness: Crystal [name] is a 82 y.o. female with a history of chronic systolic heart failure (LVEF 15-20% in 2010, 45-50% 07/2013), HTN and hypothyroidism who was added onto my schedule from the echo lab for evaluation of dyspnea.  Ms. Fakhouri is a patient of Dr. Stanford Breed with non-ischemic cardiomyopathy. She was diagnosed with heart failure in 2010, at which time she underwent cardiac catheterization that revealed no significant coronary artery disease. Her ejection fraction subsequently improved to 45-50% in 2014. She has been doing fairly well into the last several months, at which time she started to notice exertional dyspnea. She is an avid hiker and has noticed limitations in her ability to height. Over the last several days to become much more significantly short of breath. She was seen as an add on by Dr. Gaetano Hawthorne on 11/15/15 for dyspnea. She endorsed orthopnea and weight gain. (137lbs) She called the clinic regarding these symptoms and was asked to come in for an EKG. She was noted to be dyspneic on exertion and her oxygen saturation decreased to 87% with ambulation. Dr. Gaetano Hawthorne stated her on Lasix '20mg'$  BID x2 days followed by Lasix '20mg'$  daily. She also ordered a repeat 2D ECHO. She presented to the [address] office today for obtain 2 D ECHO. This revealed a worsened EF of 20-25%. She was noted to be tachypnic and added on to my schedule.   Since being started on lasix by Dr. Gaetano Hawthorne, she has only minimally improved. Not much increased UOP with the lasix. She continues to have DOE, orthopnea and PND. She denies CP. No LE edema. No syncope or dizziness.     Past Medical History  Diagnosis Date  . Cardiomyopathy     non ischemic NL cos  on cath 08/2009, EF to 15-20%, Her follow up  2D echo  in 10/2009 showed impoved EFo 35-40%  . CHF (congestive heart failure) ([place])   . Hypertension   . Anxiety   . History of lymphoma     Dr Martha Clan  . Hypothyroid   . Medicare annual wellness visit, subsequent 12/14/2014    Follows with Dr Marin Olp Follows with Dr Deatra Ina, gastroenterology, last colonoscopy in 2012, repeat in 2017 Last Pap 2011, always normal, no need for repeat Last MGM in 2011, no concerns, declines for now Follows with Dr Stanford Breed of cardiology   . Vitamin D deficiency 10/17/2015    Past Surgical History  Procedure Laterality Date  . Tonsillectomy  1952  . Dilation and curettage of uterus    . Nasal polyps removed      age 37     Current Outpatient Prescriptions  Medication Sig Dispense Refill  . bisoprolol (ZEBETA) 10 MG tablet take 1 tablet by mouth once daily 30 tablet 2  . levothyroxine (SYNTHROID, LEVOTHROID) 25 MCG tablet take 1/2 tablet by mouth once daily 30 tablet 5  . Multiple Vitamins-Minerals (CENTRUM PO) Take 1 tablet by mouth daily.     . Omega-3 Fatty Acids (FISH OIL PO) Take 1 tablet by mouth daily.    . furosemide (LASIX) 40 MG tablet Take 40 mg tablet by mouth twice a day X 3 days then take 40 mg  tablet daily 96 tablet 3  . hydrALAZINE (APRESOLINE) 25 MG tablet Take 1 tablet (25 mg total) by mouth 3 (three) times daily. 270 tablet 3  . isosorbide mononitrate (IMDUR) 30 MG 24 hr tablet Take 0.5 tablets (15 mg total) by mouth daily. 45 tablet 3   No current facility-administered medications for this visit.    Allergies:   Lisinopril and Iohexol    Social History:  The patient  reports that she has never smoked. She has never used smokeless tobacco. She reports that she does not drink alcohol or use illicit drugs.   Family History:  The patient's family history includes Arthritis in her sister, sister, and sister; Cancer in her brother; Colon cancer (age of onset: 45) in her brother; Colon  polyps in her father; Diabetes in her brother, mother, sister, sister, sister, and sister; Hypertension in her daughter, daughter, and mother; Kidney disease in her brother; Liver disease in her daughter; Obesity in her son; Pneumonia in her father; Sarcoidosis in her daughter and sister; Vision loss in her mother. There is no history of Alcohol abuse.    ROS:  Please see the history of present illness.   Otherwise, review of systems are positive for none.   All other systems are reviewed and negative.    PHYSICAL EXAM: VS:  BP 151/90 mmHg  Pulse 77  Ht '5\' 3"'$  (1.6 m)  Wt 136 lb 12.8 oz (62.052 kg)  BMI 24.24 kg/m2  SpO2 92% , BMI Body mass index is 24.24 kg/(m^2). GEN: Well nourished, well developed, in no acute distress HEENT: normal Neck: no JVD, carotid bruits, or masses Cardiac: RRR; no murmurs, rubs, or gallops,no edema  Respiratory:  clear to auscultation bilaterally, normal work of breathing GI: soft, nontender, nondistended, + BS MS: no deformity or atrophy Skin: warm and dry, no rash Neuro:  Strength and sensation are intact Psych: euthymic mood, full affect   EKG:  EKG is ordered today. The ekg ordered today demonstrates NSR HR 77. LVH w/ QRS widening   Recent Labs: 12/14/2014: Magnesium 1.7 10/17/2015: ALT 36*; BUN 14; Creatinine, Ser 1.11; Hemoglobin 14.2; Platelets 152.0; Potassium 4.7; Sodium 137; TSH 4.14    Lipid Panel    Component Value Date/Time   CHOL 201* 10/17/2015 1437   TRIG 98.0 10/17/2015 1437   HDL 53.70 10/17/2015 1437   CHOLHDL 4 10/17/2015 1437   VLDL 19.6 10/17/2015 1437   LDLCALC 128* 10/17/2015 1437      Wt Readings from Last 3 Encounters:  11/21/15 136 lb 12.8 oz (62.052 kg)  11/15/15 137 lb 9.6 oz (62.415 kg)  11/15/15 137 lb 9.6 oz (62.415 kg)      Other studies Reviewed: Additional studies/ records that were reviewed today include 2D ECHO Review of the above records demonstrates:   2D ECHO: 07/19/2013 LV EF: 45% -   50% Study Conclusions - Left ventricle: The cavity size was normal. Wall thickness was normal. Systolic function was mildly reduced. The estimated ejection fraction was in the range of 45% to 50%. Wall motion was normal; there were no regional wall motion abnormalities. - Mitral valve: Mild regurgitation.   ASSESSMENT AND PLAN: MARGEL JOENS is a 82 y.o. female with a history of chronic systolic heart failure (LVEF 15-20% in 2010, 45-50% 07/2013), HTN and hypothyroidism who was added onto my schedule from the echo lab for evaluation of dyspnea.  Acute on chronic systolic heart [id number]:2X ECHO with EF ~ 20 ( not formally read). She has  not had much response to Lasix '20mg'$  daily. I will increase lasix from '20mg'$  daily to 40 mg daily. She will take '40mg'$  BID x3 days. BMET from today pending.  -- She is on bisoprolol '10mg'$  daily. She has a hx of angioedema to Lisinopril. For this reason I will add hydralazine '25mg'$  TID + imdur '15mg'$ . We can titrate this up as BP allows. Will get BMET on Monday before appointment with Luisa Dago PA-C next Tuesday  Hypertension: Blood pressure slightly above goal. Continue bisoprolol. Angioedema with lisinopril, so no ACE-I or ARB. I have added hydralazine/nitrates in the setting of LV dysfunction as above  Hyperlipidemia: Continue fish oil.  Current medicines are reviewed at length with the patient today.  The patient does not have concerns regarding medicines.  The following changes have been made:  Increased Lasix from '20mg'$  to '40mg'$  daily. '40mg'$  BID x 3 days. I have also added hydralazine '25mg'$  TID and imdur '15mg'$  daily.   Labs/ tests ordered today include:   Orders Placed This Encounter  Procedures  . Basic Metabolic Panel (BMET)  . EKG 12-Lead   ADDENDUM: I FORGOT TO SUPPLEMENT HER POTASSIUM- I have added Kdur 20MEq to her med list- this has been called into her pharmacy.   Disposition:   FU with Luisa Dago 11/26/15 as previously  scheduled Signed, Crista Luria  11/21/2015 9:09 AM    [address], [place], [place]  [address] Phone: [phone]; Fax: [phone]

## 2015-11-21 NOTE — Progress Notes (Signed)
Crystal [name] presented for echocardiogram. Her 2010 echo showed 25-30% EF. 2014 showed 45-50% EF. Today's echo showed 20-25% EF. She told me that she had DOE as she walked to the office this morning and she woke up at 0230 this morning due to dyspnea and did not fall back asleep. Talked to DOD (Dr. Burt Knack) and he agreed to see her this morning after the echo.  [name], [place] 11/21/2015

## 2015-11-21 NOTE — Addendum Note (Signed)
Addended by: Eulis Foster on: 11/21/2015 08:05 AM   Modules accepted: Orders

## 2015-11-25 ENCOUNTER — Other Ambulatory Visit: Payer: Medicare Other

## 2015-11-25 ENCOUNTER — Other Ambulatory Visit: Payer: Medicare Other | Admitting: *Deleted

## 2015-11-25 DIAGNOSIS — R0602 Shortness of breath: Secondary | ICD-10-CM | POA: Diagnosis not present

## 2015-11-25 LAB — BASIC METABOLIC PANEL
BUN: 20 mg/dL (ref 7–25)
CHLORIDE: 97 mmol/L — AB (ref 98–110)
CO2: 31 mmol/L (ref 20–31)
Calcium: 10.5 mg/dL — ABNORMAL HIGH (ref 8.6–10.4)
Creat: 1.17 mg/dL — ABNORMAL HIGH (ref 0.60–0.93)
Glucose, Bld: 82 mg/dL (ref 65–99)
POTASSIUM: 4 mmol/L (ref 3.5–5.3)
SODIUM: 136 mmol/L (ref 135–146)

## 2015-11-26 ENCOUNTER — Encounter: Payer: Self-pay | Admitting: Physician Assistant

## 2015-11-26 ENCOUNTER — Ambulatory Visit (INDEPENDENT_AMBULATORY_CARE_PROVIDER_SITE_OTHER): Payer: Medicare Other | Admitting: Physician Assistant

## 2015-11-26 VITALS — BP 112/56 | HR 76 | Ht 63.0 in | Wt 135.9 lb

## 2015-11-26 DIAGNOSIS — I340 Nonrheumatic mitral (valve) insufficiency: Secondary | ICD-10-CM | POA: Diagnosis not present

## 2015-11-26 DIAGNOSIS — Z79899 Other long term (current) drug therapy: Secondary | ICD-10-CM

## 2015-11-26 DIAGNOSIS — I10 Essential (primary) hypertension: Secondary | ICD-10-CM

## 2015-11-26 DIAGNOSIS — I429 Cardiomyopathy, unspecified: Secondary | ICD-10-CM

## 2015-11-26 DIAGNOSIS — I428 Other cardiomyopathies: Secondary | ICD-10-CM

## 2015-11-26 DIAGNOSIS — I5022 Chronic systolic (congestive) heart failure: Secondary | ICD-10-CM | POA: Insufficient documentation

## 2015-11-26 NOTE — Progress Notes (Signed)
Patient ID: Crystal [name], female   DOB: 02/01/1934, 82 y.o.   MRN: [id number]    Date:  11/26/2015   ID:  Crystal [name], DOB March 11, 1934, MRN [id number]  PCP:  Crystal Homans, MD  Primary Cardiologist:  Crystal [name]   Chief Complaint  Patient presents with  . Follow-up    Feels much better since last week.  SOB has resolved.     History of Present Illness: Crystal [name] is a 82 y.o. female with a history of chronic systolic heart failure (LVEF 15-20% in 2010, 45-50% 07/2013), nonischemic cardiomyopathy, HTN and hypothyroidism. She was diagnosed with heart failure in 2010, at which time she underwent cardiac catheterization that revealed no significant coronary artery disease. Her ejection fraction subsequently improved to 45-50% in 2014. In October 2016 she started to notice exertional dyspnea. She is an avid hiker and has noticed limitations in her ability to hike.   She was seen by Dr. Oval [name] on 11/15/15 for dyspnea. She reported orthopnea and weight gain. (137lbs) She was noted to be dyspneic on exertion and her oxygen saturation decreased to 87% with ambulation. Dr. Gaetano [name] stated her on Lasix 81m BID x2 days followed by Lasix 243mdaily. She also ordered a repeat 2D ECHO which revealed her EF had decreased to 20/25 percent with severe diffuse hypokinesis.  She reported minimal improvement with the 204mf lasix BID then daily when seen again on January 5.  Crystal [name] increased her Lasix to 40 mg twice daily for 3 days and then 40 mg daily thereafter.  She is now here for follow-up. She reports feeling much better and feels like she's back to her normal baseline. She's not having any problems with her breathing and denies shortness of breath, apnea, PND. She also denies nausea, vomiting, fever, chest pain, dizziness, cough, congestion, abdominal pain, hematochezia, melena, lower extremity edema, claudication.  She had a basic metabolic panel yesterday her chloride was mildly decreased and  her creatinine was mildly increased. Went from 1.04-1.17.   Echocardiogram 11/21/15 Study Conclusions  - Left ventricle: The cavity size was normal. Wall thickness was normal. Systolic function was severely reduced. The estimated ejection fraction was in the range of 20% to 25%. Severe diffuse hypokinesis with no identifiable regional variations. Doppler parameters are consistent with restrictive physiology, indicative of decreased left ventricular diastolic compliance and/or increased left atrial pressure. - Mitral valve: Calcified annulus. Mildly thickened leaflets . There was moderate to severe regurgitation directed centrally. Valve area by continuity equation (using LVOT flow): 0.97 cm^2. - Left atrium: The atrium was mildly dilated. - Right ventricle: Systolic function was mildly reduced. - Pulmonary arteries: Systolic pressure was moderately increased. PA peak pressure: 56 mm Hg (S). - Pericardium, extracardiac: There was a left pleural effusion.  Impressions:  - Compared to 2014, left ventricular size has increased, LV function is substantially worse and there is evidence of severely increased filling pressures and pulmonary hypertension. Mitral insufficiency is worse, but may have been underestimated on the previous study. Right ventricular function is also worse.   BMET    Component Value Date/Time   NA 136 11/25/2015 1203   K 4.0 11/25/2015 1203   CL 97* 11/25/2015 1203   CO2 31 11/25/2015 1203   GLUCOSE 82 11/25/2015 1203   BUN 20 11/25/2015 1203   CREATININE 1.17* 11/25/2015 1203   CREATININE 1.11 10/17/2015 1437   CALCIUM 10.5* 11/25/2015 1203   GFRNONAA 52* 07/12/2013 1430   GFRNONAA 78.77 09/23/2009 1642  GFRAA 60 07/12/2013 1430   GFRAA  09/12/2009 0511    >60        The eGFR has been calculated using the MDRD equation. This calculation has not been validated in all clinical situations. eGFR's persistently <60 mL/min  signify possible Chronic Kidney Disease.     Wt Readings from Last 3 Encounters:  11/26/15 135 lb 14.4 oz (61.644 kg)  11/21/15 136 lb 12.8 oz (62.052 kg)  11/15/15 137 lb 9.6 oz (62.415 kg)     Past Medical History  Diagnosis Date  . Cardiomyopathy     non ischemic NL cos on cath 08/2009, EF to 15-20%, Her follow up  2D echo  in 10/2009 showed impoved EFo 35-40%  . CHF (congestive heart failure) ([place])   . Hypertension   . Anxiety   . History of lymphoma     Dr Crystal [name]  . Hypothyroid   . Medicare annual wellness visit, subsequent 12/14/2014    Follows with Dr Crystal [name] Follows with Dr Crystal [name], gastroenterology, last colonoscopy in 2012, repeat in 2017 Last Pap 2011, always normal, no need for repeat Last MGM in 2011, no concerns, declines for now Follows with Dr Crystal [name] of cardiology   . Vitamin D deficiency 10/17/2015    Current Outpatient Prescriptions  Medication Sig Dispense Refill  . bisoprolol (ZEBETA) 10 MG tablet take 1 tablet by mouth once daily 30 tablet 2  . furosemide (LASIX) 40 MG tablet Take 40 mg tablet by mouth twice a day X 3 days then take 40 mg tablet daily 96 tablet 3  . hydrALAZINE (APRESOLINE) 25 MG tablet Take 1 tablet (25 mg total) by mouth 3 (three) times daily. 270 tablet 3  . isosorbide mononitrate (IMDUR) 30 MG 24 hr tablet Take 0.5 tablets (15 mg total) by mouth daily. 45 tablet 3  . levothyroxine (SYNTHROID, LEVOTHROID) 25 MCG tablet take 1/2 tablet by mouth once daily 30 tablet 5  . Multiple Vitamins-Minerals (CENTRUM PO) Take 1 tablet by mouth daily.     . Omega-3 Fatty Acids (FISH OIL PO) Take 1 tablet by mouth daily.    . potassium chloride 20 MEQ TBCR Take 20 mEq by mouth daily. 30 tablet 1   No current facility-administered medications for this visit.    Allergies:    Allergies  Allergen Reactions  . Lisinopril Swelling    Facial and lip swelling  . Iohexol      Desc: hives,itiching     Social History:  The patient  reports that she  has never smoked. She has never used smokeless tobacco. She reports that she does not drink alcohol or use illicit drugs.   Family history:   Family History  Problem Relation Age of Onset  . Diabetes Mother     deceased secondary to diabetes  . Hypertension Mother   . Vision loss Mother   . Pneumonia Father     died age 62 due to complications of pneumonia  . Colon polyps Father   . Liver disease Daughter   . Sarcoidosis Daughter   . Diabetes Sister   . Other      no obvious premature cardiovascular disease or familial cardiomyopathy is noted  . Colon cancer Brother 50  . Cancer Brother   . Obesity Son   . Alcohol abuse Neg Hx   . Diabetes Brother   . Kidney disease Brother   . Diabetes Sister   . Sarcoidosis Sister   . Arthritis Sister   . Arthritis Sister   .  Diabetes Sister   . Arthritis Sister   . Diabetes Sister   . Hypertension Daughter   . Hypertension Daughter     ROS:  Please see the history of present illness.  All other systems reviewed and negative.   PHYSICAL EXAM: VS:  BP 112/56 mmHg  Pulse 76  Ht '5\' 3"'  (1.6 m)  Wt 135 lb 14.4 oz (61.644 kg)  BMI 24.08 kg/m2 Well nourished, well developed, in no acute distress HEENT: Pupils are equal round react to light accommodation extraocular movements are intact.  Neck: no JVDNo cervical lymphadenopathy. Cardiac: Regular rate and rhythm without murmurs rubs or gallops.She did have some ectopy.  Lungs:  clear to auscultation bilaterally, no wheezing, rhonchi or rales Abd: soft, nontender, positive bowel sounds all quadrants, no hepatosplenomegaly Ext: no lower extremity edema.  2+ radial and dorsalis pedis pulses. Skin: warm and dry Neuro:  Grossly normal    ASSESSMENT AND PLAN:  Problem List Items Addressed This Visit    Nonischemic cardiomyopathy ([place])   Mitral valve regurgitation, Moderate to severe   Essential hypertension   Chronic systolic heart failure ([place])    Other Visit Diagnoses     Polypharmacy    -  Primary    Relevant Orders    Basic metabolic panel      Patient reports significant improvement since her Lasix dose was increased. Kidney function was stable with only a mild increase in creatinine. We'll recheck this again on Friday. Will continue the 40 mg of Lasix daily. Her echocardiogram revealed a decrease in her ejection fraction to 20-25%.  She's not had any complaints of angina and her catheterization in 2010 revealed no evidence of disease.  I'm unsure of the etiology of her new decrease in ejection fraction.  Perhaps her BP has not been controlled. It is well controlled today and will not tolerate any medication increase. She is on hydralazine 25 mg 3 times a day, Imdur 15 mg daily, bisoprolol 10 mg and potassium 20 MG daily.  Echocardiogram also indicated moderate to severe mitral valve regurgitation. I did not appreciate any murmur on exam.  We will need to continue to monitor this.  Low-sodium diet daily weight monitoring was emphasized.

## 2015-11-26 NOTE — Patient Instructions (Signed)
Your physician recommends that you return for lab work on Friday.11/29/15  Your physician recommends that you schedule a follow-up appointment in: 3 months with Dr Stanford Breed

## 2015-11-29 ENCOUNTER — Other Ambulatory Visit (INDEPENDENT_AMBULATORY_CARE_PROVIDER_SITE_OTHER): Payer: Medicare Other | Admitting: *Deleted

## 2015-11-29 DIAGNOSIS — I10 Essential (primary) hypertension: Secondary | ICD-10-CM

## 2015-11-29 LAB — BASIC METABOLIC PANEL
BUN: 23 mg/dL (ref 7–25)
CALCIUM: 9.9 mg/dL (ref 8.6–10.4)
CHLORIDE: 100 mmol/L (ref 98–110)
CO2: 27 mmol/L (ref 20–31)
CREATININE: 1.12 mg/dL — AB (ref 0.60–0.93)
GLUCOSE: 96 mg/dL (ref 65–99)
Potassium: 4 mmol/L (ref 3.5–5.3)
Sodium: 137 mmol/L (ref 135–146)

## 2015-11-29 NOTE — Addendum Note (Signed)
Addended by: Eulis Foster on: 11/29/2015 10:50 AM   Modules accepted: Orders

## 2015-12-03 ENCOUNTER — Other Ambulatory Visit: Payer: Self-pay | Admitting: Family

## 2016-01-19 ENCOUNTER — Observation Stay (HOSPITAL_COMMUNITY): Payer: Medicare Other

## 2016-01-19 ENCOUNTER — Observation Stay (HOSPITAL_COMMUNITY)
Admission: EM | Admit: 2016-01-19 | Discharge: 2016-01-20 | Disposition: A | Discharge status: Home / Self Care | Payer: Medicare Other | Attending: Internal Medicine | Admitting: Internal Medicine

## 2016-01-19 ENCOUNTER — Encounter (HOSPITAL_COMMUNITY): Payer: Self-pay | Admitting: Emergency Medicine

## 2016-01-19 DIAGNOSIS — E039 Hypothyroidism, unspecified: Secondary | ICD-10-CM | POA: Insufficient documentation

## 2016-01-19 DIAGNOSIS — Z8572 Personal history of non-Hodgkin lymphomas: Secondary | ICD-10-CM | POA: Insufficient documentation

## 2016-01-19 DIAGNOSIS — I499 Cardiac arrhythmia, unspecified: Secondary | ICD-10-CM

## 2016-01-19 DIAGNOSIS — I340 Nonrheumatic mitral (valve) insufficiency: Secondary | ICD-10-CM | POA: Diagnosis not present

## 2016-01-19 DIAGNOSIS — R008 Other abnormalities of heart beat: Secondary | ICD-10-CM | POA: Insufficient documentation

## 2016-01-19 DIAGNOSIS — I110 Hypertensive heart disease with heart failure: Secondary | ICD-10-CM | POA: Diagnosis not present

## 2016-01-19 DIAGNOSIS — I5022 Chronic systolic (congestive) heart failure: Secondary | ICD-10-CM | POA: Insufficient documentation

## 2016-01-19 DIAGNOSIS — R55 Syncope and collapse: Principal | ICD-10-CM | POA: Insufficient documentation

## 2016-01-19 DIAGNOSIS — I428 Other cardiomyopathies: Secondary | ICD-10-CM | POA: Insufficient documentation

## 2016-01-19 DIAGNOSIS — R031 Nonspecific low blood-pressure reading: Secondary | ICD-10-CM | POA: Diagnosis not present

## 2016-01-19 DIAGNOSIS — C8590 Non-Hodgkin lymphoma, unspecified, unspecified site: Secondary | ICD-10-CM | POA: Diagnosis present

## 2016-01-19 DIAGNOSIS — D72819 Decreased white blood cell count, unspecified: Secondary | ICD-10-CM | POA: Insufficient documentation

## 2016-01-19 DIAGNOSIS — E559 Vitamin D deficiency, unspecified: Secondary | ICD-10-CM | POA: Diagnosis not present

## 2016-01-19 DIAGNOSIS — I498 Other specified cardiac arrhythmias: Secondary | ICD-10-CM | POA: Insufficient documentation

## 2016-01-19 DIAGNOSIS — Z79899 Other long term (current) drug therapy: Secondary | ICD-10-CM | POA: Insufficient documentation

## 2016-01-19 DIAGNOSIS — I509 Heart failure, unspecified: Secondary | ICD-10-CM

## 2016-01-19 LAB — BASIC METABOLIC PANEL
ANION GAP: 10 (ref 5–15)
BUN: 15 mg/dL (ref 6–20)
CHLORIDE: 100 mmol/L — AB (ref 101–111)
CO2: 25 mmol/L (ref 22–32)
Calcium: 9.7 mg/dL (ref 8.9–10.3)
Creatinine, Ser: 1.12 mg/dL — ABNORMAL HIGH (ref 0.44–1.00)
GFR calc Af Amer: 52 mL/min — ABNORMAL LOW (ref >60)
GFR, EST NON AFRICAN AMERICAN: 45 mL/min — AB (ref >60)
GLUCOSE: 101 mg/dL — AB (ref 65–99)
POTASSIUM: 4 mmol/L (ref 3.5–5.1)
Sodium: 135 mmol/L (ref 135–145)

## 2016-01-19 LAB — CBC WITH DIFFERENTIAL/PLATELET
Basophils Absolute: 0 10*3/uL (ref 0.0–0.1)
Basophils Relative: 1 %
Eosinophils Absolute: 0 10*3/uL (ref 0.0–0.7)
Eosinophils Relative: 1 %
HEMATOCRIT: 36 % (ref 36.0–46.0)
Hemoglobin: 11.6 g/dL — ABNORMAL LOW (ref 12.0–15.0)
LYMPHS ABS: 1 10*3/uL (ref 0.7–4.0)
LYMPHS PCT: 29 %
MCH: 28.2 pg (ref 26.0–34.0)
MCHC: 32.2 g/dL (ref 30.0–36.0)
MCV: 87.4 fL (ref 78.0–100.0)
MONO ABS: 0.2 10*3/uL (ref 0.1–1.0)
MONOS PCT: 6 %
NEUTROS ABS: 2.1 10*3/uL (ref 1.7–7.7)
Neutrophils Relative %: 63 %
Platelets: 127 10*3/uL — ABNORMAL LOW (ref 150–400)
RBC: 4.12 MIL/uL (ref 3.87–5.11)
RDW: 12.7 % (ref 11.5–15.5)
WBC: 3.3 10*3/uL — ABNORMAL LOW (ref 4.0–10.5)

## 2016-01-19 LAB — I-STAT TROPONIN, ED: Troponin i, poc: 0 ng/mL (ref 0.00–0.08)

## 2016-01-19 LAB — MAGNESIUM: MAGNESIUM: 1.7 mg/dL (ref 1.7–2.4)

## 2016-01-19 LAB — TROPONIN I

## 2016-01-19 LAB — BRAIN NATRIURETIC PEPTIDE: B Natriuretic Peptide: 606.3 pg/mL — ABNORMAL HIGH (ref 0.0–100.0)

## 2016-01-19 MED ORDER — ACETAMINOPHEN 325 MG PO TABS: 650.0000 mg | ORAL_TABLET | Freq: Four times a day (QID) | ORAL | Status: DC | PRN

## 2016-01-19 MED ORDER — BISOPROLOL FUMARATE 10 MG PO TABS
10.0000 mg | ORAL_TABLET | Freq: Every day | ORAL | Status: DC
  Administered 2016-01-20: 10 mg via ORAL
  Filled 2016-01-19 (×2): qty 1

## 2016-01-19 MED ORDER — SODIUM CHLORIDE 0.9% FLUSH: 3.0000 mL | INTRAVENOUS | Status: DC | PRN

## 2016-01-19 MED ORDER — MAGNESIUM SULFATE 2 GM/50ML IV SOLN
2.0000 g | Freq: Once | INTRAVENOUS | Status: AC
  Administered 2016-01-19: 2 g via INTRAVENOUS
  Filled 2016-01-19: qty 50

## 2016-01-19 MED ORDER — ONDANSETRON HCL 4 MG/2ML IJ SOLN: 4.0000 mg | Freq: Four times a day (QID) | INTRAMUSCULAR | Status: DC | PRN

## 2016-01-19 MED ORDER — HYDRALAZINE HCL 20 MG/ML IJ SOLN: 10.0000 mg | Freq: Three times a day (TID) | INTRAMUSCULAR | Status: DC | PRN

## 2016-01-19 MED ORDER — ENOXAPARIN SODIUM 40 MG/0.4ML ~~LOC~~ SOLN
40.0000 mg | SUBCUTANEOUS | Status: DC
  Administered 2016-01-19: 40 mg via SUBCUTANEOUS
  Filled 2016-01-19: qty 0.4

## 2016-01-19 MED ORDER — SODIUM CHLORIDE 0.9 % IV SOLN: 250.0000 mL | INTRAVENOUS | Status: DC | PRN

## 2016-01-19 MED ORDER — POTASSIUM CHLORIDE ER 10 MEQ PO TBCR
20.0000 meq | EXTENDED_RELEASE_TABLET | Freq: Every day | ORAL | Status: DC
  Administered 2016-01-20: 20 meq via ORAL
  Filled 2016-01-19 (×3): qty 2

## 2016-01-19 MED ORDER — HYDROCODONE-ACETAMINOPHEN 5-325 MG PO TABS: 1.0000 | ORAL_TABLET | ORAL | Status: DC | PRN

## 2016-01-19 MED ORDER — ALUM & MAG HYDROXIDE-SIMETH 200-200-20 MG/5ML PO SUSP: 30.0000 mL | Freq: Four times a day (QID) | ORAL | Status: DC | PRN

## 2016-01-19 MED ORDER — BISACODYL 5 MG PO TBEC: 5.0000 mg | DELAYED_RELEASE_TABLET | Freq: Every day | ORAL | Status: DC | PRN

## 2016-01-19 MED ORDER — SODIUM CHLORIDE 0.9% FLUSH
3.0000 mL | Freq: Two times a day (BID) | INTRAVENOUS | Status: DC
  Administered 2016-01-19 – 2016-01-20 (×2): 3 mL via INTRAVENOUS

## 2016-01-19 MED ORDER — LEVOTHYROXINE SODIUM 25 MCG PO TABS
12.5000 ug | ORAL_TABLET | Freq: Every day | ORAL | Status: DC
  Administered 2016-01-20: 12.5 ug via ORAL
  Filled 2016-01-19: qty 1

## 2016-01-19 MED ORDER — SODIUM CHLORIDE 0.9% FLUSH
3.0000 mL | Freq: Two times a day (BID) | INTRAVENOUS | Status: DC
  Administered 2016-01-19: 3 mL via INTRAVENOUS

## 2016-01-19 MED ORDER — ACETAMINOPHEN 650 MG RE SUPP: 650.0000 mg | Freq: Four times a day (QID) | RECTAL | Status: DC | PRN

## 2016-01-19 MED ORDER — ISOSORBIDE MONONITRATE ER 30 MG PO TB24
15.0000 mg | ORAL_TABLET | Freq: Every day | ORAL | Status: DC
Start: 2016-01-20 — End: 2016-01-20
  Administered 2016-01-20: 15 mg via ORAL
  Filled 2016-01-19: qty 1

## 2016-01-19 MED ORDER — FUROSEMIDE 40 MG PO TABS
40.0000 mg | ORAL_TABLET | Freq: Every day | ORAL | Status: DC
  Administered 2016-01-20: 40 mg via ORAL
  Filled 2016-01-19: qty 1

## 2016-01-19 MED ORDER — ONDANSETRON HCL 4 MG PO TABS: 4.0000 mg | ORAL_TABLET | Freq: Four times a day (QID) | ORAL | Status: DC | PRN

## 2016-01-19 NOTE — ED Provider Notes (Signed)
CSN: [id number]     Arrival date & time 01/19/16  1347 History   None    Chief Complaint  Patient presents with  . Loss of Consciousness   Patient is a 82 y.o. female presenting with syncope. The history is provided by the patient and a relative. No language interpreter was used.  Loss of Consciousness Episode history:  Single Most recent episode:  Today Duration:  30 seconds Timing:  Constant Progression:  Resolved Chronicity:  New Context: standing up   Context: not dehydration   Witnessed: yes   Relieved by:  Certain positions Worsened by:  Nothing tried Ineffective treatments:  None tried Associated symptoms: no chest pain, no confusion, no diaphoresis, no difficulty breathing, no dizziness, no fever, no focal weakness, no headaches, no nausea, no palpitations, no recent injury, no shortness of breath, no vomiting and no weakness   Risk factors: no congenital heart disease     Past Medical History  Diagnosis Date  . Cardiomyopathy     non ischemic NL cos on cath 08/2009, EF to 15-20%, Her follow up  2D echo  in 10/2009 showed impoved EFo 35-40%  . CHF (congestive heart failure) ([place])   . Hypertension   . Anxiety   . History of lymphoma     Dr Martha Clan  . Hypothyroid   . Medicare annual wellness visit, subsequent 12/14/2014    Follows with Dr Marin Olp Follows with Dr Deatra Ina, gastroenterology, last colonoscopy in 2012, repeat in 2017 Last Pap 2011, always normal, no need for repeat Last MGM in 2011, no concerns, declines for now Follows with Dr Stanford Breed of cardiology   . Vitamin D deficiency 10/17/2015   Past Surgical History  Procedure Laterality Date  . Tonsillectomy  1952  . Dilation and curettage of uterus    . Nasal polyps removed      age 41   Family History  Problem Relation Age of Onset  . Diabetes Mother     deceased secondary to diabetes  . Hypertension Mother   . Vision loss Mother   . Pneumonia Father     died age 43 due to complications of pneumonia  .  Colon polyps Father   . Liver disease Daughter   . Sarcoidosis Daughter   . Diabetes Sister   . Other      no obvious premature cardiovascular disease or familial cardiomyopathy is noted  . Colon cancer Brother 45  . Cancer Brother   . Obesity Son   . Alcohol abuse Neg Hx   . Diabetes Brother   . Kidney disease Brother   . Diabetes Sister   . Sarcoidosis Sister   . Arthritis Sister   . Arthritis Sister   . Diabetes Sister   . Arthritis Sister   . Diabetes Sister   . Hypertension Daughter   . Hypertension Daughter    Social History  Substance Use Topics  . Smoking status: Never Smoker   . Smokeless tobacco: Never Used     Comment: never used tobacco  . Alcohol Use: No   OB History    No data available     Review of Systems  Constitutional: Negative for fever, chills, diaphoresis, activity change and appetite change.  HENT: Negative for congestion, dental problem, ear pain, facial swelling, hearing loss, rhinorrhea, sneezing, sore throat, trouble swallowing and voice change.   Eyes: Negative for photophobia, pain, redness and visual disturbance.  Respiratory: Negative for apnea, cough, chest tightness, shortness of breath, wheezing and stridor.  Cardiovascular: Positive for syncope. Negative for chest pain, palpitations and leg swelling.  Gastrointestinal: Negative for nausea, vomiting, abdominal pain, diarrhea, constipation, blood in stool and abdominal distention.  Endocrine: Negative for polydipsia and polyuria.  Genitourinary: Negative for frequency, hematuria, flank pain, decreased urine volume and difficulty urinating.  Musculoskeletal: Negative for back pain, joint swelling, gait problem, neck pain and neck stiffness.  Skin: Negative for rash and wound.  Allergic/Immunologic: Negative for immunocompromised state.  Neurological: Positive for syncope. Negative for dizziness, focal weakness, facial asymmetry, speech difficulty, weakness, light-headedness, numbness and  headaches.  Hematological: Negative for adenopathy.  Psychiatric/Behavioral: Negative for suicidal ideas, behavioral problems, confusion, sleep disturbance and agitation. The patient is not nervous/anxious.   All other systems reviewed and are negative.     Allergies  Lisinopril and Iohexol  Home Medications   Prior to Admission medications   Medication Sig Start Date End Date Taking? Authorizing Provider  bisoprolol (ZEBETA) 10 MG tablet take 1 tablet by mouth once daily 12/03/15  Yes Mosie Lukes, MD  furosemide (LASIX) 40 MG tablet Take 40 mg tablet by mouth twice a day X 3 days then take 40 mg tablet daily Patient taking differently: Take 40 mg by mouth daily. Take 40 mg tablet by mouth twice a day X 3 days then take 40 mg tablet daily 11/21/15  Yes Eileen Stanford, PA-C  hydrALAZINE (APRESOLINE) 25 MG tablet Take 1 tablet (25 mg total) by mouth 3 (three) times daily. 11/21/15  Yes Eileen Stanford, PA-C  isosorbide mononitrate (IMDUR) 30 MG 24 hr tablet Take 0.5 tablets (15 mg total) by mouth daily. 11/21/15  Yes Eileen Stanford, PA-C  levothyroxine (SYNTHROID, LEVOTHROID) 25 MCG tablet take 1/2 tablet by mouth once daily 04/07/15  Yes Mosie Lukes, MD  Multiple Vitamins-Minerals (CENTRUM PO) Take 1 tablet by mouth daily.    Yes Historical Provider, MD  Omega-3 Fatty Acids (FISH OIL PO) Take 1 tablet by mouth daily.   Yes Historical Provider, MD  potassium chloride 20 MEQ TBCR Take 20 mEq by mouth daily. 11/21/15  Yes Eileen Stanford, PA-C   BP 132/47 mmHg  Pulse 76  Temp(Src) 98.1 F (36.7 C) (Oral)  Resp 18  SpO2 100% Physical Exam  Constitutional: She is oriented to person, place, and time. She appears well-developed and well-nourished. No distress.  HENT:  Head: Normocephalic and atraumatic.  Right Ear: External ear normal.  Left Ear: External ear normal.  Eyes: Pupils are equal, round, and reactive to light. Right eye exhibits no discharge. Left eye exhibits no  discharge.  Neck: Normal range of motion. No JVD present. No tracheal deviation present.  Cardiovascular: Normal rate, regular rhythm and normal heart sounds.  Exam reveals no friction rub.   No murmur heard. Pulmonary/Chest: Effort normal and breath sounds normal. No stridor. No respiratory distress. She has no wheezes.  Abdominal: Soft. Bowel sounds are normal. She exhibits no distension. There is no rebound and no guarding.  Musculoskeletal: Normal range of motion. She exhibits no edema or tenderness.  Lymphadenopathy:    She has no cervical adenopathy.  Neurological: She is alert and oriented to person, place, and time. No cranial nerve deficit. Coordination normal.  Skin: Skin is warm and dry. No rash noted. No pallor.  Psychiatric: She has a normal mood and affect. Her behavior is normal. Judgment and thought content normal.  Nursing note and vitals reviewed.   ED Course  Procedures (including critical care time) Labs Review Labs  Reviewed  CBC WITH DIFFERENTIAL/PLATELET - Abnormal; Notable for the following:    WBC 3.3 (*)    Hemoglobin 11.6 (*)    Platelets 127 (*)    All other components within normal limits  BASIC METABOLIC PANEL - Abnormal; Notable for the following:    Chloride 100 (*)    Glucose, Bld 101 (*)    Creatinine, Ser 1.12 (*)    GFR calc non Af Amer 45 (*)    GFR calc Af Amer 52 (*)    All other components within normal limits  BRAIN NATRIURETIC PEPTIDE - Abnormal; Notable for the following:    B Natriuretic Peptide 606.3 (*)    All other components within normal limits  MAGNESIUM  I-STAT TROPOININ, ED    Imaging Review No results found. I have personally reviewed and evaluated these images and lab results as part of my medical decision-making.   EKG Interpretation   Date/Time:  Sunday January 19 2016 14:07:27 EST Ventricular Rate:  76 PR Interval:  190 QRS Duration: 129 QT Interval:  430 QTC Calculation: 483 R Axis:   -50 Text Interpretation:   Sinus rhythm Ventricular bigeminy Left bundle branch  block Confirmed by DELO  MD, DOUGLAS ([id number]) on 01/19/2016 3:42:48 PM      MDM   Final diagnoses:  Syncope, unspecified syncope type  Bigeminy    Patient with history of hypertension, nonischemic cardiomyopathy, congestive heart failure, non-Hodgkin's lymphoma presents for evaluation of syncopal episode today at church. This witnessed by other church members. They state that she had a 20 to 30 second episode of LOC. She felt warm, diaphoretic and passed out. She did not fall or injure herself. Initial vital signs EMS showed blood pressure 80/40. She was given fluids and her blood pressure improved. She denied any headache, shortness of breath, chest pain prior to event. She has never had an event quite like this.  Upon arrival to emergency department she was afebrile with heart rate 56. Blood pressure 144/46. She is asymptomatic during my evaluation.  EKG with rate 76, ventricular bigeminy. Will obtain laboratory studies including troponin, BMP, BNP, CBC, magnesium.  Patient will need admission for syncope, onset ventricular bigeminy. Will defer further fluids at this point given patient's significant history of CHF. Patient without lower extremity swelling, chest pain, shortness of breath. Do not suspect pulmonary embolus as cause of patient's syncope.  5:16 PM: Troponin negative, CBC unremarkable, BMP unremarkable. BNP elevated at 606. Potassium normal, magnesium normal. Paged hospitalist for admission due to syncopal episode.  5:30 PM: Discussed with hospitalist who will admit patient to telemetry bed. Admitting hospitalist requested cardiology consultation due to new-onset bigeminy and syncope. Cardiology paged.    5:50 PM: Discussed case with cardiologist who reviewed EKG and patient's case. He will see patient. Recommended magnesium, observation on telemetry. No additional interventions unless V. tach noted on telemetry.  I discussed  case with my attending, Dr. Stark Jock.  Vira Blanco, MD 01/19/16 1751  Veryl Speak, MD 01/19/16 Karl Bales

## 2016-01-19 NOTE — Consult Note (Addendum)
Cardiology Consult    Patient ID: Crystal [name] MRN: [id number], DOB/AGE: 1934/10/01   Admit date: 01/19/2016 Date of Consult: 01/19/2016  Primary Physician: Penni Homans, MD Primary Cardiologist: Dr. Oval Linsey Requesting Provider: Dr. Adela Glimpse   Patient Profile    82F with history of NICM (EF nadir 15% in 2010) with recovery and subsequent repeat EF decline (EF 20-25% on January 2017 TTE), HTN, and hypothyroidism who presents with syncope and ventricular bigeminy.   Past Medical History   Past Medical History  Diagnosis Date  . Cardiomyopathy     non ischemic NL cos on cath 08/2009, EF to 15-20%, Her follow up  2D echo  in 10/2009 showed impoved EFo 35-40%  . CHF (congestive heart failure) ([place])   . Hypertension   . Anxiety   . History of lymphoma     Dr Martha Clan  . Hypothyroid   . Medicare annual wellness visit, subsequent 12/14/2014    Follows with Dr Marin Olp Follows with Dr Deatra Ina, gastroenterology, last colonoscopy in 2012, repeat in 2017 Last Pap 2011, always normal, no need for repeat Last MGM in 2011, no concerns, declines for now Follows with Dr Stanford Breed of cardiology   . Vitamin D deficiency 10/17/2015    Past Surgical History  Procedure Laterality Date  . Tonsillectomy  1952  . Dilation and curettage of uterus    . Nasal polyps removed      age 82     Allergies  Allergies  Allergen Reactions  . Lisinopril Swelling    Facial and lip swelling  . Iohexol      Desc: hives,itiching     History of Present Illness    82F with a history of chronic systolic heart failure (LVEF 15-20% in 2010, 45-50% 07/2013), nonischemic cardiomyopathy, HTN and hypothyroidism. She was diagnosed with heart failure in 2010, at which time she underwent cardiac catheterization that revealed no significant coronary artery disease. Her ejection fraction subsequently improved to 45-50% in 2014. In October 2016 she started to notice exertional dyspnea. She is an avid hiker and has noticed  limitations in her ability to hike. She was seen by Dr. Oval Linsey on 11/15/15 for dyspnea. She reported orthopnea and weight gain. (137lbs) She was noted to be dyspneic on exertion and her oxygen saturation decreased to 87% with ambulation. Dr. Gaetano Hawthorne stated her on Lasix '20mg'$  BID x2 days followed by Lasix '20mg'$  daily. She also ordered a repeat 2D ECHO which revealed her EF had decreased to 20-25 percent with severe diffuse hypokinesis. She reported minimal improvement with the '20mg'$  of lasix BID then daily when seen again on January 5. [name] increased her Lasix to 40 mg twice daily for 3 days and then 40 mg daily thereafter and this improved her symptoms. He has a history of angioedema with lisinopril, so no ACE-I or ARB has been prescribed. She has been on bisoprolol for years.   She was feeling quite well and went to church today. She had been eating and drinking normally and denied any GI issues or recent medication changes. She has been taking lasix '40mg'$  as prescribed. She reports that after a "long prayer" she was standing up for some time and became "real hot and dizzy."  She denies associated CP or palpitations. She had a LOC that lasted up to 3 minutes per the patient's husband; other reports suggest much shorter episode. No tonic clonic jerks.  She came to and was responding to her husband normally right after the episode. EMS  was called. She was found to be 80/40 on arrival and ECG demonstrated NSR with PVCs. She was given 350 ML of NS by EMS and BP increased to 132/60.  On arrival to the ER she was hemodynamically stable.  HR 56, 144/46, 100% on RA, 98.1  ECG (01/19/16 @ 14:07) demonstrated NSR, LAFB and bigeminy with PVCs of RBBB like morphology with inferior axis. Compared to 11/21/15, bigeminy is new. Labs were notable for K 4, Cr 1.12, BNP 606, POC TnI 0, WBC 3.3, Mg 1.7.  She is being admitted to medicine for syncope and cardiology was consulted for assistance.   She has no  previous history of syncope or pre-syncope.   Inpatient Medications      Family History    Family History  Problem Relation Age of Onset  . Diabetes Mother     deceased secondary to diabetes  . Hypertension Mother   . Vision loss Mother   . Pneumonia Father     died age 74 due to complications of pneumonia  . Colon polyps Father   . Liver disease Daughter   . Sarcoidosis Daughter   . Diabetes Sister   . Other      no obvious premature cardiovascular disease or familial cardiomyopathy is noted  . Colon cancer Brother 50  . Cancer Brother   . Obesity Son   . Alcohol abuse Neg Hx   . Diabetes Brother   . Kidney disease Brother   . Diabetes Sister   . Sarcoidosis Sister   . Arthritis Sister   . Arthritis Sister   . Diabetes Sister   . Arthritis Sister   . Diabetes Sister   . Hypertension Daughter   . Hypertension Daughter     Social History    Social History   Social History  . Marital Status: Married    Spouse Name: N/A  . Number of Children: N/A  . Years of Education: N/A   Occupational History  . Not on file.   Social History Main Topics  . Smoking status: Never Smoker   . Smokeless tobacco: Never Used     Comment: never used tobacco  . Alcohol Use: No  . Drug Use: No  . Sexual Activity: Not on file     Comment: lives with husband, no dietary restrictions, minimizes dairy   Other Topics Concern  . Not on file   Social History Narrative   The patient is married ( husband Nysia Dell)  She just recently celebrated     her 52nd anniversary.  She has five children.  There is no active     tobacco or alcohol use history.          Smoking Status:  never   Caffeine use/day:  None   Does Patient Exercise:  yes     Review of Systems    General:  No chills, fever, night sweats or weight changes.  Cardiovascular:  No chest pain, dyspnea on exertion, edema, orthopnea, palpitations, paroxysmal nocturnal dyspnea. + syncope. Dermatological: No rash,  lesions/masses Respiratory: No cough, dyspnea Urologic: No hematuria, dysuria Abdominal:   No nausea, vomiting, diarrhea, [name] red blood per rectum, melena, or hematemesis Neurologic:  No visual changes, wkns, changes in mental status. All other systems reviewed and are otherwise negative except as noted above.  Physical Exam    Blood pressure 132/47, pulse 76, temperature 98.1 F (36.7 C), temperature source Oral, resp. rate 18, SpO2 100 %.  General: Pleasant, NAD Psych: Normal  affect. Neuro: Alert and oriented X 3. Moves all extremities spontaneously. HEENT: Normal  Neck: Supple without bruits. JVP 9cmH2O Lungs:  Resp regular and unlabored, CTA. Heart: RRR no s3, s4. 2/6 systolic murmur.  Abdomen: Soft, non-tender, non-distended, BS + x 4.  Extremities: No clubbing, cyanosis or edema. DP/PT/Radials 2+ and equal bilaterally.  Labs    Troponin [facility] of Care Test)  Recent Labs  01/19/16 1614  TROPIPOC 0.00   No results for input(s): CKTOTAL, CKMB, TROPONINI in the last 72 hours. Lab Results  Component Value Date   WBC 3.3* 01/19/2016   HGB 11.6* 01/19/2016   HCT 36.0 01/19/2016   MCV 87.4 01/19/2016   PLT 127* 01/19/2016    Recent Labs Lab 01/19/16 1600  NA 135  K 4.0  CL 100*  CO2 25  BUN 15  CREATININE 1.12*  CALCIUM 9.7  GLUCOSE 101*   Lab Results  Component Value Date   CHOL 201* 10/17/2015   HDL 53.70 10/17/2015   LDLCALC 128* 10/17/2015   TRIG 98.0 10/17/2015   Lab Results  Component Value Date   DDIMER * 09/11/2009    5.26        AT THE INHOUSE ESTABLISHED CUTOFF VALUE OF 0.48 ug/mL FEU, THIS ASSAY HAS BEEN DOCUMENTED IN THE LITERATURE TO HAVE A SENSITIVITY AND NEGATIVE PREDICTIVE VALUE OF AT LEAST 98 TO 99%.  THE TEST RESULT SHOULD BE CORRELATED WITH AN ASSESSMENT OF THE CLINICAL PROBABILITY OF DVT / VTE.     Radiology Studies    No results found.  ECG & Cardiac Imaging    Echocardiogram 11/21/15 Study Conclusions  - Left  ventricle: The cavity size was normal. Wall thickness was normal. Systolic function was severely reduced. The estimated ejection fraction was in the range of 20% to 25%. Severe diffuse hypokinesis with no identifiable regional variations. Doppler parameters are consistent with restrictive physiology, indicative of decreased left ventricular diastolic compliance and/or increased left atrial pressure. - Mitral valve: Calcified annulus. Mildly thickened leaflets . There was moderate to severe regurgitation directed centrally. Valve area by continuity equation (using LVOT flow): 0.97 cm^2. - Left atrium: The atrium was mildly dilated. - Right ventricle: Systolic function was mildly reduced. - Pulmonary arteries: Systolic pressure was moderately increased. PA peak pressure: 56 mm Hg (S). - Pericardium, extracardiac: There was a left pleural effusion.  Impressions:  - Compared to 2014, left ventricular size has increased, LV function is substantially worse and there is evidence of severely increased filling pressures and pulmonary hypertension. Mitral insufficiency is worse, but may have been underestimated on the previous study. Right ventricular function is also worse.  Assessment & Plan    43F with history of NICM (EF nadir 15% in 2010) with recovery and subsequent repeat EF decline (EF 20-25% on January 2017 TTE), HTN, and hypothyroidism who presents with syncope and ventricular bigeminy. The presentation is most consistent with a vasovagal mechanism precipitations by standing for a long time and in church and feeling warm. The relative bradycardia (HR in 50s) is likely due to sustained vagal tone and unlikely to indicate a primary arrhythmic etiology for syncope The bigeminy might have somewhat contributed to a predisposition to a vasovagal event because not all of her PVCs appear to perfuse, however, she does not specifically report palpitations and it may be that  she did not develop the PVCs until after the event. It is impossible to know.  She appears euvolemic on exam right now, at least after receiving the fluid  bolus by EMS.   Syncope. Most likely vasovagal. Rule out arrhythmia as a cause. She has had a recent echo and does not need a repeat echo - telemetry monitoring - continue home dose of bisoprolol and lasix  Bigeminy. Although her ER ECG demonstrates bigeminy, it appears that for much of the time in the ER she has not been in bigeminy. She has had no sustained arrhythmias -- longest was a single triplet. She does not have symptoms. It is interesting to consider whether she may actually have a substantial PVC burden which may have contributed to her recent EF decline - continue bisoprolol '10mg'$  daily - K>4, Mg>2 - tele monitoring - if high burden PVCs, consider PVC myopathy as etiology for recent EF decline  HFrEF, euvolemic.  - continue bisoprolol '10mg'$  daily - No ACE/ARB due to history of angiodema to ACE - consider addition of spiro based on electrolytes and volume status prior to discharge - if high burden PVCs, consider PVC myopathy as etiology for recent EF decline - She will need EF recheck in a few months (April) and if EF remains 35% or less, she will need counseling re: possible primary prevention ICD implantation  HTN.  - Continue home regimen of hydralazine, imdur, and bisoprolol - Check orthostatics in AM   Signed, Lamar Sprinkles, MD 01/19/2016, 5:51 PM

## 2016-01-19 NOTE — ED Notes (Signed)
Cardiologist MD at bedside.

## 2016-01-19 NOTE — H&P (Signed)
[facility] History and Physical  LANICE FOLDEN UVO:[id number] DOB: [birth year] DOA: 01/19/2016  Referring physician: Dr Adela Glimpse PCP: Penni Homans, MD   Chief Complaint: Lost of conciouness  HPI: Crystal [name] is a 82 y.o. female very pleasant with PMH significant for non ischemic cardiomyopathy EF 25 %, History of lymphoma, HNT, who presents after syncope episode. She was at church today, standing when she felt dizzy, warm. She sat down and subsequently pass out. The episode of LOC lasted for 20 seconds. Initial vital signs EMS showed blood pressure 80/40. She was given fluids and her blood pressure improved.   Evaluation in the ED; EKG with Bigeminy, bradycardia, K at 4.0, Mg at 1.7, SBP 160. Troponin 0.0    Review of Systems:  Negative, except as per HPI  Past Medical History  Diagnosis Date  . Cardiomyopathy     non ischemic NL cos on cath 08/2009, EF to 15-20%, Her follow up  2D echo  in 10/2009 showed impoved EFo 35-40%  . CHF (congestive heart failure) ([place])   . Hypertension   . Anxiety   . History of lymphoma     Dr Martha Clan  . Hypothyroid   . Medicare annual wellness visit, subsequent 12/14/2014    Follows with Dr Marin Olp Follows with Dr Deatra Ina, gastroenterology, last colonoscopy in 2012, repeat in 2017 Last Pap 2011, always normal, no need for repeat Last MGM in 2011, no concerns, declines for now Follows with Dr Stanford Breed of cardiology   . Vitamin D deficiency 10/17/2015   Past Surgical History  Procedure Laterality Date  . Tonsillectomy  1952  . Dilation and curettage of uterus    . Nasal polyps removed      age 76   Social History:  reports that she has never smoked. She has never used smokeless tobacco. She reports that she does not drink alcohol or use illicit drugs.  Allergies  Allergen Reactions  . Lisinopril Swelling    Facial and lip swelling  . Iohexol      Desc: hives,itiching     Family History  Problem Relation Age of Onset  . Diabetes  Mother     deceased secondary to diabetes  . Hypertension Mother   . Vision loss Mother   . Pneumonia Father     died age 27 due to complications of pneumonia  . Colon polyps Father   . Liver disease Daughter   . Sarcoidosis Daughter   . Diabetes Sister   . Other      no obvious premature cardiovascular disease or familial cardiomyopathy is noted  . Colon cancer Brother 44  . Cancer Brother   . Obesity Son   . Alcohol abuse Neg Hx   . Diabetes Brother   . Kidney disease Brother   . Diabetes Sister   . Sarcoidosis Sister   . Arthritis Sister   . Arthritis Sister   . Diabetes Sister   . Arthritis Sister   . Diabetes Sister   . Hypertension Daughter   . Hypertension Daughter     Prior to Admission medications   Medication Sig Start Date End Date Taking? Authorizing Provider  bisoprolol (ZEBETA) 10 MG tablet take 1 tablet by mouth once daily 12/03/15  Yes Mosie Lukes, MD  furosemide (LASIX) 40 MG tablet Take 40 mg tablet by mouth twice a day X 3 days then take 40 mg tablet daily Patient taking differently: Take 40 mg by mouth daily. Take 40 mg tablet by mouth  twice a day X 3 days then take 40 mg tablet daily 11/21/15  Yes Eileen Stanford, PA-C  hydrALAZINE (APRESOLINE) 25 MG tablet Take 1 tablet (25 mg total) by mouth 3 (three) times daily. 11/21/15  Yes Eileen Stanford, PA-C  isosorbide mononitrate (IMDUR) 30 MG 24 hr tablet Take 0.5 tablets (15 mg total) by mouth daily. 11/21/15  Yes Eileen Stanford, PA-C  levothyroxine (SYNTHROID, LEVOTHROID) 25 MCG tablet take 1/2 tablet by mouth once daily 04/07/15  Yes Mosie Lukes, MD  Multiple Vitamins-Minerals (CENTRUM PO) Take 1 tablet by mouth daily.    Yes Historical Provider, MD  Omega-3 Fatty Acids (FISH OIL PO) Take 1 tablet by mouth daily.   Yes Historical Provider, MD  potassium chloride 20 MEQ TBCR Take 20 mEq by mouth daily. 11/21/15  Yes Eileen Stanford, PA-C   Physical Exam: Filed Vitals:   01/19/16 1430 01/19/16  1445 01/19/16 1500 01/19/16 1542  BP: 138/62 135/66 143/70 132/47  Pulse: 57 62 69 76  Temp:      TempSrc:      Resp: '19 18 22 18  '$ SpO2: 100% 100% 98% 100%    Wt Readings from Last 3 Encounters:  11/26/15 61.644 kg (135 lb 14.4 oz)  11/21/15 62.052 kg (136 lb 12.8 oz)  11/15/15 62.415 kg (137 lb 9.6 oz)    General:  Appears calm and comfortable Eyes: PERRL, normal lids, irises & conjunctiva ENT: grossly normal hearing, lips & tongue Neck: no LAD, masses or thyromegaly Cardiovascular: RRR, no m/r/g. No LE edema. Telemetry: SR, no arrhythmias  Respiratory: CTA bilaterally, no w/r/r. Normal respiratory effort. Abdomen: soft, ntnd Skin: no rash or induration seen on limited exam Musculoskeletal: grossly normal tone BUE/BLE Psychiatric: grossly normal mood and affect, speech fluent and appropriate Neurologic: grossly non-focal.          Labs on Admission:  Basic Metabolic Panel:  Recent Labs Lab 01/19/16 1600  NA 135  K 4.0  CL 100*  CO2 25  GLUCOSE 101*  BUN 15  CREATININE 1.12*  CALCIUM 9.7  MG 1.7   Liver Function Tests: No results for input(s): AST, ALT, ALKPHOS, BILITOT, PROT, ALBUMIN in the last 168 hours. No results for input(s): LIPASE, AMYLASE in the last 168 hours. No results for input(s): AMMONIA in the last 168 hours. CBC:  Recent Labs Lab 01/19/16 1600  WBC 3.3*  NEUTROABS 2.1  HGB 11.6*  HCT 36.0  MCV 87.4  PLT 127*   Cardiac Enzymes: No results for input(s): CKTOTAL, CKMB, CKMBINDEX, TROPONINI in the last 168 hours.  BNP (last 3 results)  Recent Labs  01/19/16 1600  BNP 606.3*    ProBNP (last 3 results) No results for input(s): PROBNP in the last 8760 hours.  CBG: No results for input(s): GLUCAP in the last 168 hours.  Radiological Exams on Admission: No results found.  EKG: Independently reviewed. Bigeminy  Assessment/Plan Active Problems:   Non Hodgkin's lymphoma (HCC)   Hypothyroidism   Nonischemic cardiomyopathy  (HCC)   Congestive heart failure (HCC)   Mitral valve regurgitation, Moderate to severe   Syncope   1-Syncope:   Vaso-vagal vs arrhythmia. Patient at risk for arrhythmia due to decrease Ef.  EKG with bigeminy. Sinus bradycardia.  Will cycle enzymes.  Will ordered ECHO.  Cardiology consulted.  Will hold Bisoprolol for now, will defer resumption of BB to cardiology.  Replete mg . Check chest x ray.   2-Non ischemic Cardiomyopathy:  ECHO: Ef 25 %  January 2017  Resume lasix in am.  Resume hydralazine if BP continue to be elevated.   3-HTN; was Hypotensive initially.  PRN hydralazine.   4-Hypothyroidism: Continue with synthroid.  5-Leukopenia; chronic , stable.   Code Status: Partial Code, patient does not want to be intubated.  DVT Prophylaxis: lovenix Family Communication: Care discussed with husband and son who were at bedsdie Disposition Plan: admit under observation   Time spent: 75 minutes.   Niel Hummer A [facility] Pager [phone]

## 2016-01-19 NOTE — ED Notes (Signed)
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 Pt became hot and had a witnessed syncopal episode lasting about 20 seconds. Pt initial BP for EMS was 80/40. Pt EKG for EMS was NSR with PVC's. Pt given 350 ML of NS by EMS and BP increased to 132/60.

## 2016-01-19 NOTE — ED Notes (Signed)
Dr. Adela Glimpse MD at bedside.

## 2016-01-20 ENCOUNTER — Other Ambulatory Visit (HOSPITAL_COMMUNITY): Payer: Medicare Other

## 2016-01-20 DIAGNOSIS — R55 Syncope and collapse: Secondary | ICD-10-CM | POA: Diagnosis not present

## 2016-01-20 DIAGNOSIS — I499 Cardiac arrhythmia, unspecified: Secondary | ICD-10-CM

## 2016-01-20 DIAGNOSIS — I498 Other specified cardiac arrhythmias: Secondary | ICD-10-CM | POA: Insufficient documentation

## 2016-01-20 LAB — CBC
HEMATOCRIT: 37 % (ref 36.0–46.0)
Hemoglobin: 12 g/dL (ref 12.0–15.0)
MCH: 28.6 pg (ref 26.0–34.0)
MCHC: 32.4 g/dL (ref 30.0–36.0)
MCV: 88.1 fL (ref 78.0–100.0)
Platelets: 131 10*3/uL — ABNORMAL LOW (ref 150–400)
RBC: 4.2 MIL/uL (ref 3.87–5.11)
RDW: 12.9 % (ref 11.5–15.5)
WBC: 3 10*3/uL — AB (ref 4.0–10.5)

## 2016-01-20 LAB — TROPONIN I
Troponin I: 0.03 ng/mL (ref <0.031)
Troponin I: <0.03 ng/mL (ref <0.031)

## 2016-01-20 LAB — BASIC METABOLIC PANEL
Anion gap: 8 (ref 5–15)
BUN: 16 mg/dL (ref 6–20)
CO2: 27 mmol/L (ref 22–32)
CREATININE: 1.04 mg/dL — AB (ref 0.44–1.00)
Calcium: 9.5 mg/dL (ref 8.9–10.3)
Chloride: 104 mmol/L (ref 101–111)
GFR calc non Af Amer: 49 mL/min — ABNORMAL LOW (ref >60)
GFR, EST AFRICAN AMERICAN: 57 mL/min — AB (ref >60)
Glucose, Bld: 97 mg/dL (ref 65–99)
POTASSIUM: 3.9 mmol/L (ref 3.5–5.1)
SODIUM: 139 mmol/L (ref 135–145)

## 2016-01-20 MED ORDER — SPIRONOLACTONE 25 MG PO TABS: 25.0000 mg | ORAL_TABLET | Freq: Every day | ORAL | Status: DC

## 2016-01-20 MED ORDER — FUROSEMIDE 20 MG PO TABS: 20.0000 mg | ORAL_TABLET | Freq: Every day | ORAL | Status: DC

## 2016-01-20 MED ORDER — HYDRALAZINE HCL 25 MG PO TABS
25.0000 mg | ORAL_TABLET | Freq: Three times a day (TID) | ORAL | Status: DC
  Administered 2016-01-20: 25 mg via ORAL
  Filled 2016-01-20: qty 1

## 2016-01-20 MED ORDER — SPIRONOLACTONE 25 MG PO TABS
25.0000 mg | ORAL_TABLET | Freq: Every day | ORAL | Status: DC
  Administered 2016-01-20: 25 mg via ORAL
  Filled 2016-01-20: qty 1

## 2016-01-20 NOTE — Discharge Summary (Signed)
Physician Discharge Summary  Crystal [name] JHE:[id number] DOB: May 22, 1934 DOA: 01/19/2016  PCP: Penni Homans, MD  Admit date: 01/19/2016 Discharge date: 01/20/2016  Time spent: 35 minutes  Recommendations for Outpatient Follow-up:  Needs to follow up with cardiology for repeat ECHO in April.  Needs CBC to follow leukopenia.  Needs to follow up with DR Marin Olp for lymphoma and abnormal Chest x ray finding.   Discharge Diagnoses:    Syncope   Non Hodgkin's lymphoma ([place])   Hypothyroidism   Nonischemic cardiomyopathy (HCC)   Congestive heart failure (HCC)   Mitral valve regurgitation, Moderate to severe   Discharge Condition: stable.  Diet recommendation: heart healthy  Filed Weights   01/19/16 1900 01/20/16 0552  Weight: 59.875 kg (132 lb) 60.555 kg (133 lb 8 oz)    History of present illness:  Crystal [name] is a 82 y.o. female very pleasant with PMH significant for non ischemic cardiomyopathy EF 25 %, History of lymphoma, HNT, who presents after syncope episode. She was at church today, standing when she felt dizzy, warm. She sat down and subsequently pass out. The episode of LOC lasted for 20 seconds. Initial vital signs EMS showed blood pressure 80/40. She was given fluids and her blood pressure improved.   Evaluation in the ED; EKG with Bigeminy, bradycardia, K at 4.0, Mg at 1.7, SBP 160. Troponin 0.0   Hospital Course:  1-Syncope:  Vaso-vagal vs arrhythmia. Patient at risk for arrhythmia due to decrease Ef.  EKG with bigeminy. Sinus bradycardia.  Enzyme negative.  Per cardio need repeat ECHO in April.  Cardiology consulted.  Resume bisoprolol.  Replete mg . chest x ray; Bilateral hilar prominence dating back 2010. Needs to follow up with Dr Marin Olp, patient is aware.  Per cardiology note, patient has decline ICD at this time. Further conversation outpatient.   2-Non ischemic Cardiomyopathy:  ECHO: Ef 25 % January 2017  Resume lasix lower dose. Started on  spironolactone. Had angioedema with  ACE Hold Hydralazine at discharge.SBP in the 100 range.    3-HTN; was Hypotensive initially. resolved.   4-Hypothyroidism: Continue with synthroid.  5-Leukopenia; chronic , stable.   Procedures:  none  Consultations:  Cardiology  Discharge Exam: Filed Vitals:   01/20/16 0552 01/20/16 1327  BP: 138/58 109/68  Pulse: 58 65  Temp: 97.7 F (36.5 C) 98 F (36.7 C)  Resp: 18 18    General: NAD Cardiovascular: S 1, S 2 RRR Respiratory: CTA  Discharge Instructions   Discharge Instructions    Diet - low sodium heart healthy    Complete by:  As directed      Increase activity slowly    Complete by:  As directed           Current Discharge Medication List    START taking these medications   Details  spironolactone (ALDACTONE) 25 MG tablet Take 1 tablet (25 mg total) by mouth daily. Qty: 30 tablet, Refills: 0      CONTINUE these medications which have CHANGED   Details  furosemide (LASIX) 20 MG tablet Take 1 tablet (20 mg total) by mouth daily. Qty: 30 tablet, Refills: 0      CONTINUE these medications which have NOT CHANGED   Details  bisoprolol (ZEBETA) 10 MG tablet take 1 tablet by mouth once daily Qty: 30 tablet, Refills: 5    isosorbide mononitrate (IMDUR) 30 MG 24 hr tablet Take 0.5 tablets (15 mg total) by mouth daily. Qty: 45 tablet, Refills: 3  levothyroxine (SYNTHROID, LEVOTHROID) 25 MCG tablet take 1/2 tablet by mouth once daily Qty: 30 tablet, Refills: 5    Multiple Vitamins-Minerals (CENTRUM PO) Take 1 tablet by mouth daily.     Omega-3 Fatty Acids (FISH OIL PO) Take 1 tablet by mouth daily.      STOP taking these medications     hydrALAZINE (APRESOLINE) 25 MG tablet      potassium chloride 20 MEQ TBCR        Allergies  Allergen Reactions  . Lisinopril Swelling    Facial and lip swelling  . Iohexol      Desc: hives,itiching    Follow-up Information    Follow up with Penni Homans, MD In  1 week.   Specialty:  Family Medicine   Contact information:   [address] [place] [address] [phone]       Follow up with Volanda Napoleon, MD In 2 weeks.   Specialty:  Oncology   Contact information:   [address], [address] [place] [address] [phone]       Follow up with [name], MD On 01/30/2016.   Specialty:  Cardiology   Why:  2:15 PM   Contact information:   [place]  [address] [phone]        The results of significant diagnostics from this hospitalization (including imaging, microbiology, ancillary and laboratory) are listed below for reference.    Significant Diagnostic Studies: Dg Chest 2 View  01/19/2016  CLINICAL DATA:  Syncope today while in church. EXAM: CHEST  2 VIEW COMPARISON:  Most recent exam 09/12/2009 FINDINGS: Borderline cardiomegaly. There is bilateral hilar prominence, right hilar prominence seen previously, left hilum previously obscured. Pulmonary vasculature is normal. Trace left pleural effusion or pleural thickening at the costophrenic angle with scarring at the left lung base. No acute osseous abnormalities are seen. IMPRESSION: 1. Borderline cardiomegaly. Bilateral hilar prominence. Right hilar prominence was seen previously dating back to 2010, left hilum previously obscured. 2. Left basilar scarring with small left pleural effusion versus pleural thickening. Electronically Signed   By: Jeb Levering M.D.   On: 01/19/2016 23:36    Microbiology: No results found for this or any previous visit (from the past 240 hour(s)).   Labs: Basic Metabolic Panel:  Recent Labs Lab 01/19/16 1600 01/20/16 0700  NA 135 139  K 4.0 3.9  CL 100* 104  CO2 25 27  GLUCOSE 101* 97  BUN 15 16  CREATININE 1.12* 1.04*  CALCIUM 9.7 9.5  MG 1.7  --    Liver Function Tests: No results for input(s): AST, ALT, ALKPHOS, BILITOT, PROT, ALBUMIN in the last 168 hours. No results for  input(s): LIPASE, AMYLASE in the last 168 hours. No results for input(s): AMMONIA in the last 168 hours. CBC:  Recent Labs Lab 01/19/16 1600 01/20/16 0700  WBC 3.3* 3.0*  NEUTROABS 2.1  --   HGB 11.6* 12.0  HCT 36.0 37.0  MCV 87.4 88.1  PLT 127* 131*   Cardiac Enzymes:  Recent Labs Lab 01/19/16 1948 01/19/16 2359 01/20/16 0700  TROPONINI <0.03 <0.03 0.03   BNP: BNP (last 3 results)  Recent Labs  01/19/16 1600  BNP 606.3*    ProBNP (last 3 results) No results for input(s): PROBNP in the last 8760 hours.  CBG: No results for input(s): GLUCAP in the last 168 hours.     Signed:  Elmarie Shiley MD.  [facility] 01/20/2016, 3:12  PM

## 2016-01-20 NOTE — Progress Notes (Signed)
Patient Name: Crystal [name] Date of Encounter: 01/20/2016  Primary cardiologist: Dr. Stanford Breed Pt. Profile: 89F with history of NICM (EF nadir 15% in 2010) with recovery and subsequent repeat EF decline (EF 20-25% on January 2017 TTE), HTN, and hypothyroidism who presents with syncope and ventricular bigeminy.   SUBJECTIVE  Feeling well. No chest pain, sob or palpitations.   CURRENT MEDS . bisoprolol  10 mg Oral Daily  . enoxaparin (LOVENOX) injection  40 mg Subcutaneous Q24H  . furosemide  40 mg Oral Daily  . isosorbide mononitrate  15 mg Oral Daily  . levothyroxine  12.5 mcg Oral QAC breakfast  . potassium chloride  20 mEq Oral Daily  . sodium chloride flush  3 mL Intravenous Q12H  . sodium chloride flush  3 mL Intravenous Q12H    OBJECTIVE  Filed Vitals:   01/19/16 1900 01/19/16 2114 01/19/16 2332 01/20/16 0552  BP: 150/59 132/53 143/59 138/58  Pulse:  59 60 58  Temp: 98.3 F (36.8 C) 97.9 F (36.6 C) 98.1 F (36.7 C) 97.7 F (36.5 C)  TempSrc: Oral Oral Oral Oral  Resp: '18 16 18 18  '$ Height: '5\' 3"'$  (1.6 m)     Weight: 132 lb (59.875 kg)   133 lb 8 oz (60.555 kg)  SpO2: 100% 100% 100% 100%    Intake/Output Summary (Last 24 hours) at 01/20/16 0936 Last data filed at 01/20/16 0855  Gross per 24 hour  Intake   1010 ml  Output   2151 ml  Net  -1141 ml   Filed Weights   01/19/16 1900 01/20/16 0552  Weight: 132 lb (59.875 kg) 133 lb 8 oz (60.555 kg)    PHYSICAL EXAM  General: Pleasant, NAD. Neuro: Alert and oriented X 3. Moves all extremities spontaneously. Psych: Normal affect. HEENT:  Normal  Neck: Supple without bruits or JVD. Lungs:  Resp regular and unlabored, CTA. Heart: RRR no s3, s4. Systolic murmur Abdomen: Soft, non-tender, non-distended, BS + x 4.  Extremities: No clubbing, cyanosis or edema. DP/PT/Radials 2+ and equal bilaterally.  Accessory Clinical Findings  CBC  Recent Labs  01/19/16 1600 01/20/16 0700  WBC 3.3* 3.0*  NEUTROABS 2.1   --   HGB 11.6* 12.0  HCT 36.0 37.0  MCV 87.4 88.1  PLT 127* 417*   Basic Metabolic Panel  Recent Labs  01/19/16 1600 01/20/16 0700  NA 135 139  K 4.0 3.9  CL 100* 104  CO2 25 27  GLUCOSE 101* 97  BUN 15 16  CREATININE 1.12* 1.04*  CALCIUM 9.7 9.5  MG 1.7  --    Cardiac Enzymes  Recent Labs  01/19/16 1948 01/19/16 2359 01/20/16 0700  TROPONINI <0.03 <0.03 0.03    TELE  Sinus rhythm with PVCs  Radiology/Studies  Dg Chest 2 View  01/19/2016  CLINICAL DATA:  Syncope today while in church. EXAM: CHEST  2 VIEW COMPARISON:  Most recent exam 09/12/2009 FINDINGS: Borderline cardiomegaly. There is bilateral hilar prominence, right hilar prominence seen previously, left hilum previously obscured. Pulmonary vasculature is normal. Trace left pleural effusion or pleural thickening at the costophrenic angle with scarring at the left lung base. No acute osseous abnormalities are seen. IMPRESSION: 1. Borderline cardiomegaly. Bilateral hilar prominence. Right hilar prominence was seen previously dating back to 2010, left hilum previously obscured. 2. Left basilar scarring with small left pleural effusion versus pleural thickening. Electronically Signed   By: Jeb Levering M.D.   On: 01/19/2016 23:36    ASSESSMENT AND PLAN  1. Syncope - Likely vasovagal given long stand and feeling warm. Could be due to arrhythmia. Tele showed frequent PVCs. Consider monitor at discharge.   2. Bigeminy - No sustained arrhthymias. Continue BB. Stable lytes.   3. NICM - History of chronic systolic heart failure (LVEF 15-20% in 2010, 45-50% 07/2013) - Recent Echo 11/2015 showed again declined in EF to 20-25%. Likely due to PVCs burden. Repeat echo in [place].  - BNP of 606.3. Euvolemic on exam. Diuresed negative 1.1 L.  - Continue Lasix '40mg'$  po daily, imdur. Consider addition of spiro prior to discharge. -  History of angiodema to ACE  4. HTN - Elevated at times.    Signed, Bhagat,Bhavinkumar  PA-C Pager [phone]  As above, patient seen and examined. Patient denies chest pain, dyspnea or dizziness this morning. I have personally reviewed the patient's telemetry. This shows sinus rhythm with PVCs and brief nonsustained ventricular tachycardia. Patient's event sounds to have been vagally mediated. However she has a nonischemic cardiomyopathy and is at risk for ventricular arrhythmias. Long discussion concerning further evaluation and the potential for consideration of ICD. The patient states she would prefer conservative measures at this point. She understands the risk of sudden cardiac death and declines EP evaluation or Life Vest. We will therefore continue with medical therapy. Continue beta blocker, hydralazine and nitrates. No ACE inhibitor as she had angioedema with this previously. Continue Lasix but decreased to 20 mg daily. Add spironolactone 25 mg daily. DC Kdur. Check potassium and renal function in 1 week. Patient can be discharged today in follow up with me on March 16 as scheduled. I have instructed her not to drive for 6 months. > 30 min PA and physician time D2 [name]

## 2016-01-20 NOTE — Care Management Obs Status (Signed)
[facility] NOTIFICATION   Patient Details  Name: KABAO LEITE MRN: [id number] Date of Birth: [birth year]   Medicare Observation Status Notification Given:  Yes    Royston Bake, RN 01/20/2016, 3:57 PM

## 2016-01-20 NOTE — Discharge Instructions (Signed)
Please follow up with Dr Marin Olp in 2 weeks. He might need to repeat CT chest

## 2016-01-21 ENCOUNTER — Telehealth: Payer: Self-pay | Admitting: *Deleted

## 2016-01-21 MED ORDER — SPIRONOLACTONE 25 MG PO TABS: 25.0000 mg | ORAL_TABLET | Freq: Every day | ORAL | Status: DC

## 2016-01-21 NOTE — Telephone Encounter (Signed)
Per Dr. Charlett Blake, ok to resend new rx for spironolactone as pt did not receive printed rx on discharge. Medication filled to pharmacy as requested.

## 2016-01-21 NOTE — Telephone Encounter (Addendum)
Transition Care Management Follow-up Telephone Call  Admit date: 01/19/2016 Discharge date: 01/20/2016  Time spent: 35 minutes  Recommendations for Outpatient Follow-up:  Needs to follow up with cardiology for repeat ECHO in April.  Needs CBC to follow leukopenia.  Needs to follow up with DR Marin Olp for lymphoma and abnormal Chest x ray finding.   Discharge Diagnoses:   Syncope  Non Hodgkin's lymphoma ([place])  Hypothyroidism  Nonischemic cardiomyopathy (HCC)  Congestive heart failure (HCC)  Mitral valve regurgitation, Moderate to severe  Discharge Condition: stable.  Diet recommendation: heart healthy    How have you been since you were released from the hospital? "I've been feeling fine. I started feeling good right after I got to the hospital and I still feel good."   Do you understand why you were in the hospital? yes   Do you understand the discharge instructions? yes   Where were you discharged to? Home   Items Reviewed:  Medications reviewed: yes, pt has not started taking spironolactone because she was not able to pick up prescription from pharmacy. Per chart review, rx was printed. Pt states she did not receive any printed prescriptions when she was discharged from the hospital.   Allergies reviewed: yes  Dietary changes reviewed: yes, heart healthy diet instructions reviewed w/ pt  Referrals reviewed: no, none made   Functional Questionnaire:   Activities of Daily Living (ADLs):   She states they are independent in the following: ambulation, bathing and hygiene, feeding, continence, grooming, toileting and dressing States they require assistance with the following: None   Any transportation issues/concerns?: no   Any patient concerns? no   Confirmed importance and date/time of follow-up visits scheduled yes  Provider Appointment booked with: Unable to book appt w/ Dr. Charlett Blake in 1 week time frame as provider has no openings. Will discuss w/  provider.  Confirmed with patient if condition begins to worsen call PCP or go to the ER.  Patient was given the office number and encouraged to call back with question or concerns.  : yes, confirmed office phone # w/ pt

## 2016-01-21 NOTE — Addendum Note (Signed)
Addended by: Dorrene German on: 01/21/2016 11:53 AM   Modules accepted: Orders

## 2016-01-21 NOTE — Telephone Encounter (Signed)
Follow up appt scheduled 01/23/16 @ 11am.

## 2016-01-23 ENCOUNTER — Ambulatory Visit (HOSPITAL_BASED_OUTPATIENT_CLINIC_OR_DEPARTMENT_OTHER): Payer: Medicare Other | Admitting: Family

## 2016-01-23 ENCOUNTER — Encounter: Payer: Self-pay | Admitting: Family

## 2016-01-23 ENCOUNTER — Other Ambulatory Visit (HOSPITAL_BASED_OUTPATIENT_CLINIC_OR_DEPARTMENT_OTHER): Payer: Medicare Other

## 2016-01-23 ENCOUNTER — Ambulatory Visit (INDEPENDENT_AMBULATORY_CARE_PROVIDER_SITE_OTHER): Payer: Medicare Other | Admitting: Family Medicine

## 2016-01-23 ENCOUNTER — Other Ambulatory Visit: Payer: Self-pay | Admitting: Family

## 2016-01-23 ENCOUNTER — Encounter: Payer: Self-pay | Admitting: Family Medicine

## 2016-01-23 VITALS — BP 116/73 | HR 59 | Temp 97.9°F | Ht 63.0 in | Wt 133.0 lb

## 2016-01-23 VITALS — BP 118/66 | HR 58 | Temp 98.1°F | Resp 16 | Ht 63.0 in | Wt 132.0 lb

## 2016-01-23 DIAGNOSIS — Z8572 Personal history of non-Hodgkin lymphomas: Secondary | ICD-10-CM | POA: Diagnosis not present

## 2016-01-23 DIAGNOSIS — C8590 Non-Hodgkin lymphoma, unspecified, unspecified site: Secondary | ICD-10-CM

## 2016-01-23 DIAGNOSIS — E039 Hypothyroidism, unspecified: Secondary | ICD-10-CM

## 2016-01-23 DIAGNOSIS — C8330 Diffuse large B-cell lymphoma, unspecified site: Secondary | ICD-10-CM

## 2016-01-23 DIAGNOSIS — I10 Essential (primary) hypertension: Secondary | ICD-10-CM | POA: Diagnosis not present

## 2016-01-23 DIAGNOSIS — R55 Syncope and collapse: Secondary | ICD-10-CM | POA: Diagnosis not present

## 2016-01-23 DIAGNOSIS — E782 Mixed hyperlipidemia: Secondary | ICD-10-CM | POA: Diagnosis not present

## 2016-01-23 LAB — COMPREHENSIVE METABOLIC PANEL
ALK PHOS: 109 U/L (ref 39–117)
ALT: 28 U/L (ref 0–35)
AST: 30 U/L (ref 0–37)
Albumin: 4.1 g/dL (ref 3.5–5.2)
BILIRUBIN TOTAL: 0.7 mg/dL (ref 0.2–1.2)
BUN: 18 mg/dL (ref 6–23)
CO2: 30 mEq/L (ref 19–32)
CREATININE: 1.1 mg/dL (ref 0.40–1.20)
Calcium: 9.9 mg/dL (ref 8.4–10.5)
Chloride: 101 mEq/L (ref 96–112)
GFR: 61.45 mL/min (ref >60.00)
Glucose, Bld: 107 mg/dL — ABNORMAL HIGH (ref 70–99)
POTASSIUM: 3.6 meq/L (ref 3.5–5.1)
Sodium: 137 mEq/L (ref 135–145)
TOTAL PROTEIN: 9.1 g/dL — AB (ref 6.0–8.3)

## 2016-01-23 LAB — CBC WITH DIFFERENTIAL (CANCER CENTER ONLY)
BASO#: 0 10*3/uL (ref 0.0–0.2)
BASO%: 0.3 % (ref 0.0–2.0)
EOS%: 1.8 % (ref 0.0–7.0)
Eosinophils Absolute: 0.1 10*3/uL (ref 0.0–0.5)
HCT: 37.3 % (ref 34.8–46.6)
HGB: 12.3 g/dL (ref 11.6–15.9)
LYMPH#: 1.4 10*3/uL (ref 0.9–3.3)
LYMPH%: 39.8 % (ref 14.0–48.0)
MCH: 29.4 pg (ref 26.0–34.0)
MCHC: 33 g/dL (ref 32.0–36.0)
MCV: 89 fL (ref 81–101)
MONO#: 0.6 10*3/uL (ref 0.1–0.9)
MONO%: 17.8 % — AB (ref 0.0–13.0)
NEUT#: 1.4 10*3/uL — ABNORMAL LOW (ref 1.5–6.5)
NEUT%: 40.3 % (ref 39.6–80.0)
PLATELETS: 123 10*3/uL — AB (ref 145–400)
RBC: 4.19 10*6/uL (ref 3.70–5.32)
RDW: 12.4 % (ref 11.1–15.7)
WBC: 3.4 10*3/uL — AB (ref 3.9–10.0)

## 2016-01-23 LAB — TSH: TSH: 2.37 u[IU]/mL (ref 0.35–4.50)

## 2016-01-23 LAB — CHCC SATELLITE - SMEAR

## 2016-01-23 NOTE — Progress Notes (Signed)
Pre visit review using our clinic review tool, if applicable. No additional management support is needed unless otherwise documented below in the visit note. 

## 2016-01-23 NOTE — Progress Notes (Signed)
Hematology and Oncology Follow Up Visit  Crystal [name] [id number] [birth year] 82 y.o. 01/23/2016   Principle Diagnosis:  Diffuse large cell non-Hodgkin's lymphoma-remission  Current Therapy:   Observation    Interim History:  Crystal [name] is here today for a follow-up. She had a syncopal episode at church on Sunday and was hospitalized for a day. She has a low EF at 25% and was unclear as to whether is was a vaso vagal episode vs. Arrythmia. Her EKG showed bigeminy and sinus bradycardia. She is now home and feeling much better. She is doing well on lasix, spironolactone and bisoprolol. She states that she has occasional palpitations and that this is not new for her. It was recommended that she have a pacemaker placed but refused this.  She has no complaints at this time. Her biggest concern is not having to miss the senior olympics.  Her chest xray showed bilateral hilar prominence. No lymphadenopathy found on exam.  No problem with infections. No fever, chills, n/v, cough, rash, dizziness, SOB, chest pain, abdominal pain or changes in bowel or bladder habits.  She is back to walking some and wants to start hiking once the weather warms up.  No swelling, tenderness, numbness or tingling in her extremities. No c/o joint aches or "bone" pain.  Her appetite is good and she is making sure to stay hydrated. Her weight is stable.   Medications:    Medication List       This list is accurate as of: 01/23/16  2:52 PM.  Always use your most recent med list.               bisoprolol 10 MG tablet  Commonly known as:  ZEBETA  take 1 tablet by mouth once daily     CENTRUM PO  Take 1 tablet by mouth daily.     FISH OIL PO  Take 1 tablet by mouth daily.     furosemide 20 MG tablet  Commonly known as:  LASIX  Take 1 tablet (20 mg total) by mouth daily.     isosorbide mononitrate 30 MG 24 hr tablet  Commonly known as:  IMDUR  Take 0.5 tablets (15 mg total) by mouth daily.     levothyroxine  25 MCG tablet  Commonly known as:  SYNTHROID, LEVOTHROID  take 1/2 tablet by mouth once daily     spironolactone 25 MG tablet  Commonly known as:  ALDACTONE  Take 1 tablet (25 mg total) by mouth daily.        Allergies:  Allergies  Allergen Reactions  . Lisinopril Swelling    Facial and lip swelling  . Iohexol      Desc: hives,itiching     Past Medical History, Surgical history, Social history, and Family History were reviewed and updated.  Review of Systems: All other 10 point review of systems is negative.   Physical Exam:  height is '5\' 3"'$  (1.6 m) and weight is 132 lb (59.875 kg). Her oral temperature is 98.1 F (36.7 C). Her blood pressure is 118/66 and her pulse is 58. Her respiration is 16.   Wt Readings from Last 3 Encounters:  01/23/16 132 lb (59.875 kg)  01/23/16 133 lb (60.328 kg)  01/20/16 133 lb 8 oz (60.555 kg)    Ocular: Sclerae unicteric, pupils equal, round and reactive to light Ear-nose-throat: Oropharynx clear, dentition fair Lymphatic: No cervical supraclavicular axillary or inguinal adenopathy Lungs no rales or rhonchi, good excursion bilaterally Heart regular rate and  rhythm, no murmur appreciated Abd soft, nontender, positive bowel sounds, no liver or spleen tip palpated on exam, no fluid wave MSK no focal spinal tenderness, no joint edema Neuro: non-focal, well-oriented, appropriate affect Breasts: Deferred  Lab Results  Component Value Date   WBC 3.4* 01/23/2016   HGB 12.3 01/23/2016   HCT 37.3 01/23/2016   MCV 89 01/23/2016   PLT 123* 01/23/2016   No results found for: FERRITIN, IRON, TIBC, UIBC, IRONPCTSAT Lab Results  Component Value Date   RETICCTPCT 1.1 10/15/2011   RBC 4.19 01/23/2016   RETICCTABS 49.7 10/15/2011   No results found for: KPAFRELGTCHN, LAMBDASER, KAPLAMBRATIO No results found for: IGGSERUM, IGA, IGMSERUM No results found for: Ronnald Ramp, A1GS, A2GS, Arnaldo Natal, GAMS, MSPIKE, SPEI   Chemistry       Component Value Date/Time   NA 139 01/20/2016 0700   K 3.9 01/20/2016 0700   CL 104 01/20/2016 0700   CO2 27 01/20/2016 0700   BUN 16 01/20/2016 0700   CREATININE 1.04* 01/20/2016 0700   CREATININE 1.12* 11/29/2015 1050      Component Value Date/Time   CALCIUM 9.5 01/20/2016 0700   ALKPHOS 141* 10/17/2015 1437   AST 33 10/17/2015 1437   ALT 36* 10/17/2015 1437   BILITOT 0.8 10/17/2015 1437     Impression and Plan: Crystal [name] is a very pleasant 82 year old African American female with a history of diffuse large cell non-Hodgkin lymphoma. She is now 15 years out from therapy and so far there has been no evidence of recurrence. She is asymptomatic at this time.  She had a syncopal episodes while at church this past weekend that was felt to be cardiac related possibly a vaso vagal episode or arrhythmia. She refused to have a pace maker placed and instead had her medication adjusted. She follows up with Dr. Stanford Breed on the 16th and will have a repeat echo in April.  I discussed her chest x-ray with Dr. Marin Olp and the bilateral hilar prominence was felt to be due to her heart issues and unlikely a recurrence of lymphoma since she is otherwise asymptomatic.  No lymphadenopathy. No anemia. WBC count 3.4 and platelets 123.  We will plan to see her back in 1 year for follow-up and labs.  She knows to contact us with any questions or concerns. We can certainly see her sooner if need be.   Eliezer Bottom, NP 3/9/20172:52 PM

## 2016-01-23 NOTE — Patient Instructions (Signed)
Hypertension Hypertension, commonly called high blood pressure, is when the force of blood pumping through your arteries is too strong. Your arteries are the blood vessels that carry blood from your heart throughout your body. A blood pressure reading consists of a higher number over a lower number, such as 110/72. The higher number (systolic) is the pressure inside your arteries when your heart pumps. The lower number (diastolic) is the pressure inside your arteries when your heart relaxes. Ideally you want your blood pressure below 120/80. Hypertension forces your heart to work harder to pump blood. Your arteries may become narrow or stiff. Having untreated or uncontrolled hypertension can cause heart attack, stroke, kidney disease, and other problems. RISK FACTORS Some risk factors for high blood pressure are controllable. Others are not.  Risk factors you cannot control include:   Race. You may be at higher risk if you are African American.  Age. Risk increases with age.  Gender. Men are at higher risk than women before age 45 years. After age 65, women are at higher risk than men. Risk factors you can control include:  Not getting enough exercise or physical activity.  Being overweight.  Getting too much fat, sugar, calories, or salt in your diet.  Drinking too much alcohol. SIGNS AND SYMPTOMS Hypertension does not usually cause signs or symptoms. Extremely high blood pressure (hypertensive crisis) may cause headache, anxiety, shortness of breath, and nosebleed. DIAGNOSIS To check if you have hypertension, your health care provider will measure your blood pressure while you are seated, with your arm held at the level of your heart. It should be measured at least twice using the same arm. Certain conditions can cause a difference in blood pressure between your right and left arms. A blood pressure reading that is higher than normal on one occasion does not mean that you need treatment. If  it is not clear whether you have high blood pressure, you may be asked to return on a different day to have your blood pressure checked again. Or, you may be asked to monitor your blood pressure at home for 1 or more weeks. TREATMENT Treating high blood pressure includes making lifestyle changes and possibly taking medicine. Living a healthy lifestyle can help lower high blood pressure. You may need to change some of your habits. Lifestyle changes may include:  Following the DASH diet. This diet is high in fruits, vegetables, and whole grains. It is low in salt, red meat, and added sugars.  Keep your sodium intake below 2,300 mg per day.  Getting at least 30-45 minutes of aerobic exercise at least 4 times per week.  Losing weight if necessary.  Not smoking.  Limiting alcoholic beverages.  Learning ways to reduce stress. Your health care provider may prescribe medicine if lifestyle changes are not enough to get your blood pressure under control, and if one of the following is true:  You are 18-59 years of age and your systolic blood pressure is above 140.  You are 60 years of age or older, and your systolic blood pressure is above 150.  Your diastolic blood pressure is above 90.  You have diabetes, and your systolic blood pressure is over 140 or your diastolic blood pressure is over 90.  You have kidney disease and your blood pressure is above 140/90.  You have heart disease and your blood pressure is above 140/90. Your personal target blood pressure may vary depending on your medical conditions, your age, and other factors. HOME CARE INSTRUCTIONS    Have your blood pressure rechecked as directed by your health care provider.   Take medicines only as directed by your health care provider. Follow the directions carefully. Blood pressure medicines must be taken as prescribed. The medicine does not work as well when you skip doses. Skipping doses also puts you at risk for  problems.  Do not smoke.   Monitor your blood pressure at home as directed by your health care provider. SEEK MEDICAL CARE IF:   You think you are having a reaction to medicines taken.  You have recurrent headaches or feel dizzy.  You have swelling in your ankles.  You have trouble with your vision. SEEK IMMEDIATE MEDICAL CARE IF:  You develop a severe headache or confusion.  You have unusual weakness, numbness, or feel faint.  You have severe chest or abdominal pain.  You vomit repeatedly.  You have trouble breathing. MAKE SURE YOU:   Understand these instructions.  Will watch your condition.  Will get help right away if you are not doing well or get worse.   This information is not intended to replace advice given to you by your health care provider. Make sure you discuss any questions you have with your health care provider.   Document Released: 11/02/2005 Document Revised: 03/19/2015 Document Reviewed: 08/25/2013 Elsevier Interactive Patient Education 2016 Else[employer] Inc[employer]  

## 2016-01-24 NOTE — Progress Notes (Signed)
HPI: FU nonischemic cardiomyopathy. Cardiac catheterization in 2010 showed an ejection fraction of 15-20% and normal coronary arteries. Patient was recently seen in the office with excess volume and diuretics were increased. Echocardiogram repeated January 2017 and showed ejection fraction 20-25%.There was restrictive filling. There was moderate to severe mitral regurgitation, mild left atrial enlargement, mildly reduced RV function and moderately elevated pulmonary pressures. Patient admitted with syncope recently. Event was felt to potentially be vagally mediated. However given reduced LV function I discussed the possibility of of life vest or ICD. However she declined and preferred only medical therapy. The risk of sudden death was explained. She was instructed not to drive for 6 months. Since last seen, the patient denies any dyspnea on exertion, orthopnea, PND, pedal edema, palpitations, syncope or chest pain.   Current Outpatient Prescriptions  Medication Sig Dispense Refill  . bisoprolol (ZEBETA) 10 MG tablet take 1 tablet by mouth once daily 30 tablet 5  . furosemide (LASIX) 20 MG tablet Take 1 tablet (20 mg total) by mouth daily. 30 tablet 0  . isosorbide mononitrate (IMDUR) 30 MG 24 hr tablet Take 0.5 tablets (15 mg total) by mouth daily. 45 tablet 3  . levothyroxine (SYNTHROID, LEVOTHROID) 25 MCG tablet take 1/2 tablet by mouth once daily 30 tablet 5  . Multiple Vitamins-Minerals (CENTRUM PO) Take 1 tablet by mouth daily.     . Omega-3 Fatty Acids (FISH OIL PO) Take 1 tablet by mouth daily.    Marland Kitchen spironolactone (ALDACTONE) 25 MG tablet Take 1 tablet (25 mg total) by mouth daily. 30 tablet 0   No current facility-administered medications for this visit.     Past Medical History  Diagnosis Date  . Cardiomyopathy     non ischemic NL cos on cath 08/2009, EF to 15-20%, Her follow up  2D echo  in 10/2009 showed impoved EFo 35-40%  . CHF (congestive heart failure) ([place])   .  Hypertension   . Anxiety   . History of lymphoma     Dr Martha Clan  . Hypothyroid   . Medicare annual wellness visit, subsequent 12/14/2014    Follows with Dr Marin Olp Follows with Dr Deatra Ina, gastroenterology, last colonoscopy in 2012, repeat in 2017 Last Pap 2011, always normal, no need for repeat Last MGM in 2011, no concerns, declines for now Follows with Dr Stanford Breed of cardiology   . Vitamin D deficiency 10/17/2015    Past Surgical History  Procedure Laterality Date  . Tonsillectomy  1952  . Dilation and curettage of uterus    . Nasal polyps removed      age 82    Social History   Social History  . Marital Status: Married    Spouse Name: N/A  . Number of Children: N/A  . Years of Education: N/A   Occupational History  . Not on file.   Social History Main Topics  . Smoking status: Never Smoker   . Smokeless tobacco: Never Used     Comment: never used tobacco  . Alcohol Use: No  . Drug Use: No  . Sexual Activity: Not on file     Comment: lives with husband, no dietary restrictions, minimizes dairy   Other Topics Concern  . Not on file   Social History Narrative   The patient is married ( husband Nyonna Hargrove)  She just recently celebrated     her 52nd anniversary.  She has five children.  There is no active     tobacco  or alcohol use history.          Smoking Status:  never   Caffeine use/day:  None   Does Patient Exercise:  yes    Family History  Problem Relation Age of Onset  . Diabetes Mother     deceased secondary to diabetes  . Hypertension Mother   . Vision loss Mother   . Pneumonia Father     died age 86 due to complications of pneumonia  . Colon polyps Father   . Liver disease Daughter   . Sarcoidosis Daughter   . Diabetes Sister   . Other      no obvious premature cardiovascular disease or familial cardiomyopathy is noted  . Colon cancer Brother 9  . Cancer Brother   . Obesity Son   . Alcohol abuse Neg Hx   . Diabetes Brother   . Kidney  disease Brother   . Diabetes Sister   . Sarcoidosis Sister   . Arthritis Sister   . Arthritis Sister   . Diabetes Sister   . Arthritis Sister   . Diabetes Sister   . Hypertension Daughter   . Hypertension Daughter     ROS: no fevers or chills, productive cough, hemoptysis, dysphasia, odynophagia, melena, hematochezia, dysuria, hematuria, rash, seizure activity, orthopnea, PND, pedal edema, claudication. Remaining systems are negative.  Physical Exam: Well-developed well-nourished in no acute distress.  Skin is warm and dry.  HEENT is normal.  Neck is supple.  Chest is clear to auscultation with normal expansion.  Cardiovascular exam is regular rate and rhythm.  Abdominal exam nontender or distended. No masses palpated. Extremities show no edema. neuro grossly intact

## 2016-01-30 ENCOUNTER — Encounter: Payer: Self-pay | Admitting: Cardiology

## 2016-01-30 ENCOUNTER — Ambulatory Visit (INDEPENDENT_AMBULATORY_CARE_PROVIDER_SITE_OTHER): Payer: Medicare Other | Admitting: Cardiology

## 2016-01-30 VITALS — BP 110/74 | HR 64 | Ht 63.0 in | Wt 133.0 lb

## 2016-01-30 DIAGNOSIS — R55 Syncope and collapse: Secondary | ICD-10-CM | POA: Diagnosis not present

## 2016-01-30 DIAGNOSIS — I429 Cardiomyopathy, unspecified: Secondary | ICD-10-CM | POA: Diagnosis not present

## 2016-01-30 DIAGNOSIS — I428 Other cardiomyopathies: Secondary | ICD-10-CM | POA: Diagnosis not present

## 2016-01-30 LAB — BASIC METABOLIC PANEL
BUN: 19 mg/dL (ref 7–25)
CHLORIDE: 100 mmol/L (ref 98–110)
CO2: 29 mmol/L (ref 20–31)
CREATININE: 1.01 mg/dL — AB (ref 0.60–0.88)
Calcium: 10 mg/dL (ref 8.6–10.4)
GLUCOSE: 74 mg/dL (ref 65–99)
POTASSIUM: 4.6 mmol/L (ref 3.5–5.3)
Sodium: 138 mmol/L (ref 135–146)

## 2016-01-30 MED ORDER — HYDRALAZINE HCL 10 MG PO TABS: 10.0000 mg | ORAL_TABLET | Freq: Three times a day (TID) | ORAL | Status: DC

## 2016-01-30 NOTE — Assessment & Plan Note (Signed)
Patient had a recent syncopal episode. The event sounded vagal when she was admitted to the hospital. However given reduced LV function there is concern of ventricular arrhythmia. We again discussed life vest or ICD. She has no interest in this and understands the risk of sudden death.

## 2016-01-30 NOTE — Assessment & Plan Note (Signed)
Patient appears to be euvolemic. Continue present dose of diuretics. Check potassium and renal function.

## 2016-01-30 NOTE — Assessment & Plan Note (Signed)
Blood pressure controlled. Continue present medications. 

## 2016-01-30 NOTE — Patient Instructions (Signed)
Medication Instructions:   START HYDRALAZINE 10 MG ONE TABLET THREE TIMES DAILY  Labwork:  Your physician recommends that you HAVE LAB WORK TODAY  Follow-Up:  Your physician recommends that you schedule a follow-up appointment in: [place]

## 2016-01-30 NOTE — Assessment & Plan Note (Signed)
Continue beta blocker. She has an allergy to lisinopril-Angioedema. Continue nitrates. Add hydralazine 10 mg 3 times a day.

## 2016-02-02 NOTE — Assessment & Plan Note (Signed)
Encouraged heart healthy diet, increase exercise, avoid trans fats, consider a krill oil cap daily 

## 2016-02-02 NOTE — Assessment & Plan Note (Signed)
Well controlled, no changes to meds. Encouraged heart healthy diet such as the DASH diet and exercise as tolerated.  °

## 2016-02-02 NOTE — Progress Notes (Signed)
Patient ID: Crystal [name], female   DOB: Jul 08, 1934, 82 y.o.   MRN: [id number]   Subjective:    Patient ID: Crystal [name], female    DOB: [birth year], 82 y.o.   MRN: [id number]  Chief Complaint  Patient presents with  . Hospitalization Follow-up    HPI Patient is in today for follow up. Feels well today but has noted some recent mild dyspnea on exertion. Not persistent. No recent concerns otherwise. SUBJECTIVE:Denies CP/palp/SOB/HA/congestion/fevers/GI or GU c/o. Taking meds as prescribed  Past Medical History  Diagnosis Date  . Cardiomyopathy     non ischemic NL cos on cath 08/2009, EF to 15-20%, Her follow up  2D echo  in 10/2009 showed impoved EFo 35-40%  . CHF (congestive heart failure) ([place])   . Hypertension   . Anxiety   . History of lymphoma     Dr Martha Clan  . Hypothyroid   . Medicare annual wellness visit, subsequent 12/14/2014    Follows with Dr Marin Olp Follows with Dr Deatra Ina, gastroenterology, last colonoscopy in 2012, repeat in 2017 Last Pap 2011, always normal, no need for repeat Last MGM in 2011, no concerns, declines for now Follows with Dr Stanford Breed of cardiology   . Vitamin D deficiency 10/17/2015    Past Surgical History  Procedure Laterality Date  . Tonsillectomy  1952  . Dilation and curettage of uterus    . Nasal polyps removed      age 89    Family History  Problem Relation Age of Onset  . Diabetes Mother     deceased secondary to diabetes  . Hypertension Mother   . Vision loss Mother   . Pneumonia Father     died age 28 due to complications of pneumonia  . Colon polyps Father   . Liver disease Daughter   . Sarcoidosis Daughter   . Diabetes Sister   . Other      no obvious premature cardiovascular disease or familial cardiomyopathy is noted  . Colon cancer Brother 51  . Cancer Brother   . Obesity Son   . Alcohol abuse Neg Hx   . Diabetes Brother   . Kidney disease Brother   . Diabetes Sister   . Sarcoidosis Sister   . Arthritis Sister   .  Arthritis Sister   . Diabetes Sister   . Arthritis Sister   . Diabetes Sister   . Hypertension Daughter   . Hypertension Daughter     Social History   Social History  . Marital Status: Married    Spouse Name: N/A  . Number of Children: N/A  . Years of Education: N/A   Occupational History  . Not on file.   Social History Main Topics  . Smoking status: Never Smoker   . Smokeless tobacco: Never Used     Comment: never used tobacco  . Alcohol Use: No  . Drug Use: No  . Sexual Activity: Not on file     Comment: lives with husband, no dietary restrictions, minimizes dairy   Other Topics Concern  . Not on file   Social History Narrative   The patient is married ( husband Crystal [name])  She just recently celebrated     her 52nd anniversary.  She has five children.  There is no active     tobacco or alcohol use history.          Smoking Status:  never   Caffeine use/day:  None   Does Patient Exercise:  yes  Outpatient Prescriptions Prior to Visit  Medication Sig Dispense Refill  . bisoprolol (ZEBETA) 10 MG tablet take 1 tablet by mouth once daily 30 tablet 5  . furosemide (LASIX) 20 MG tablet Take 1 tablet (20 mg total) by mouth daily. 30 tablet 0  . isosorbide mononitrate (IMDUR) 30 MG 24 hr tablet Take 0.5 tablets (15 mg total) by mouth daily. 45 tablet 3  . levothyroxine (SYNTHROID, LEVOTHROID) 25 MCG tablet take 1/2 tablet by mouth once daily 30 tablet 5  . Multiple Vitamins-Minerals (CENTRUM PO) Take 1 tablet by mouth daily.     . Omega-3 Fatty Acids (FISH OIL PO) Take 1 tablet by mouth daily.    Marland Kitchen spironolactone (ALDACTONE) 25 MG tablet Take 1 tablet (25 mg total) by mouth daily. 30 tablet 0   No facility-administered medications prior to visit.    Allergies  Allergen Reactions  . Lisinopril Swelling    Facial and lip swelling  . Iohexol      Desc: hives,itiching     Review of Systems  Constitutional: Positive for malaise/fatigue. Negative for fever.    HENT: Negative for congestion.   Eyes: Negative for blurred vision.  Respiratory: Positive for shortness of breath.   Cardiovascular: Negative for chest pain, palpitations and leg swelling.  Gastrointestinal: Negative for nausea, abdominal pain and blood in stool.  Genitourinary: Negative for dysuria and frequency.  Musculoskeletal: Negative for falls.  Skin: Negative for rash.  Neurological: Negative for dizziness, loss of consciousness and headaches.  Endo/Heme/Allergies: Negative for environmental allergies.  Psychiatric/Behavioral: Negative for depression. The patient is not nervous/anxious.        Objective:    Physical Exam  Constitutional: She is oriented to person, place, and time. She appears well-developed and well-nourished. No distress.  HENT:  Head: Normocephalic and atraumatic.  Nose: Nose normal.  Eyes: Right eye exhibits no discharge. Left eye exhibits no discharge.  Neck: Normal range of motion. Neck supple.  Cardiovascular: Normal rate and regular rhythm.   No murmur heard. Pulmonary/Chest: Effort normal and breath sounds normal.  Abdominal: Soft. Bowel sounds are normal. There is no tenderness.  Musculoskeletal: She exhibits no edema.  Neurological: She is alert and oriented to person, place, and time.  Skin: Skin is warm and dry.  Psychiatric: She has a normal mood and affect.  Nursing note and vitals reviewed.   BP 116/73 mmHg  Pulse 59  Temp(Src) 97.9 F (36.6 C) (Oral)  Ht '5\' 3"'$  (1.6 m)  Wt 133 lb (60.328 kg)  BMI 23.57 kg/m2  SpO2 100% Wt Readings from Last 3 Encounters:  01/30/16 133 lb (60.328 kg)  01/23/16 132 lb (59.875 kg)  01/23/16 133 lb (60.328 kg)     Lab Results  Component Value Date   WBC 3.4* 01/23/2016   HGB 12.3 01/23/2016   HCT 37.3 01/23/2016   PLT 123* 01/23/2016   GLUCOSE 74 01/30/2016   CHOL 201* 10/17/2015   TRIG 98.0 10/17/2015   HDL 53.70 10/17/2015   LDLCALC 128* 10/17/2015   ALT 28 01/23/2016   AST 30  01/23/2016   NA 138 01/30/2016   K 4.6 01/30/2016   CL 100 01/30/2016   CREATININE 1.01* 01/30/2016   BUN 19 01/30/2016   CO2 29 01/30/2016   TSH 2.37 01/23/2016   INR 1.08 09/05/2009   HGBA1C 5.7 10/17/2015    Lab Results  Component Value Date   TSH 2.37 01/23/2016   Lab Results  Component Value Date   WBC 3.4* 01/23/2016  HGB 12.3 01/23/2016   HCT 37.3 01/23/2016   MCV 89 01/23/2016   PLT 123* 01/23/2016   Lab Results  Component Value Date   NA 138 01/30/2016   K 4.6 01/30/2016   CO2 29 01/30/2016   GLUCOSE 74 01/30/2016   BUN 19 01/30/2016   CREATININE 1.01* 01/30/2016   BILITOT 0.7 01/23/2016   ALKPHOS 109 01/23/2016   AST 30 01/23/2016   ALT 28 01/23/2016   PROT 9.1* 01/23/2016   ALBUMIN 4.1 01/23/2016   CALCIUM 10.0 01/30/2016   ANIONGAP 8 01/20/2016   GFR 61.45 01/23/2016   Lab Results  Component Value Date   CHOL 201* 10/17/2015   Lab Results  Component Value Date   HDL 53.70 10/17/2015   Lab Results  Component Value Date   LDLCALC 128* 10/17/2015   Lab Results  Component Value Date   TRIG 98.0 10/17/2015   Lab Results  Component Value Date   CHOLHDL 4 10/17/2015   Lab Results  Component Value Date   HGBA1C 5.7 10/17/2015       Assessment & Plan:   Problem List Items Addressed This Visit    Essential hypertension    Well controlled, no changes to meds. Encouraged heart healthy diet such as the DASH diet and exercise as tolerated.       Relevant Orders   TSH (Completed)   Comprehensive metabolic panel (Completed)   Hyperlipidemia, mixed    Encouraged heart healthy diet, increase exercise, avoid trans fats, consider a krill oil cap daily      Hypothyroidism    On Levothyroxine, continue to monitor      Syncope - Primary    Has not had a recurrent episode since returning home. Encouraged to hydrate well and not skip meals. Notify us if worsens further and/or seek care      Relevant Orders   TSH (Completed)    Comprehensive metabolic panel (Completed)      I am having Ms. Lust maintain her Multiple Vitamins-Minerals (CENTRUM PO), Omega-3 Fatty Acids (FISH OIL PO), levothyroxine, isosorbide mononitrate, bisoprolol, furosemide, and spironolactone.  No orders of the defined types were placed in this encounter.     Penni Homans, MD

## 2016-02-02 NOTE — Assessment & Plan Note (Signed)
On Levothyroxine, continue to monitor 

## 2016-02-02 NOTE — Assessment & Plan Note (Signed)
Has not had a recurrent episode since returning home. Encouraged to hydrate well and not skip meals. Notify us if worsens further and/or seek care

## 2016-02-17 ENCOUNTER — Other Ambulatory Visit: Payer: Self-pay | Admitting: Family Medicine

## 2016-02-17 MED ORDER — SPIRONOLACTONE 25 MG PO TABS: 25.0000 mg | ORAL_TABLET | Freq: Every day | ORAL | Status: DC

## 2016-03-17 ENCOUNTER — Other Ambulatory Visit: Payer: Self-pay | Admitting: Family Medicine

## 2016-04-12 ENCOUNTER — Other Ambulatory Visit: Payer: Self-pay | Admitting: Family Medicine

## 2016-04-16 ENCOUNTER — Telehealth: Payer: Self-pay

## 2016-04-16 NOTE — Telephone Encounter (Signed)
Pre visit call made to patient. Left message for call back.

## 2016-04-17 ENCOUNTER — Ambulatory Visit: Payer: Medicare Other | Admitting: Hematology & Oncology

## 2016-04-17 ENCOUNTER — Encounter: Payer: Self-pay | Admitting: Family Medicine

## 2016-04-17 ENCOUNTER — Other Ambulatory Visit: Payer: Medicare Other

## 2016-04-17 ENCOUNTER — Ambulatory Visit (INDEPENDENT_AMBULATORY_CARE_PROVIDER_SITE_OTHER): Payer: Medicare Other | Admitting: Family Medicine

## 2016-04-17 VITALS — BP 108/62 | HR 63 | Temp 97.7°F | Ht 63.0 in | Wt 134.4 lb

## 2016-04-17 DIAGNOSIS — Z0000 Encounter for general adult medical examination without abnormal findings: Secondary | ICD-10-CM | POA: Diagnosis not present

## 2016-04-17 DIAGNOSIS — R739 Hyperglycemia, unspecified: Secondary | ICD-10-CM

## 2016-04-17 DIAGNOSIS — D696 Thrombocytopenia, unspecified: Secondary | ICD-10-CM | POA: Diagnosis not present

## 2016-04-17 DIAGNOSIS — E782 Mixed hyperlipidemia: Secondary | ICD-10-CM

## 2016-04-17 DIAGNOSIS — E559 Vitamin D deficiency, unspecified: Secondary | ICD-10-CM

## 2016-04-17 DIAGNOSIS — R319 Hematuria, unspecified: Secondary | ICD-10-CM

## 2016-04-17 DIAGNOSIS — E039 Hypothyroidism, unspecified: Secondary | ICD-10-CM

## 2016-04-17 DIAGNOSIS — I10 Essential (primary) hypertension: Secondary | ICD-10-CM

## 2016-04-17 LAB — COMPREHENSIVE METABOLIC PANEL
ALBUMIN: 4.1 g/dL (ref 3.5–5.2)
ALK PHOS: 109 U/L (ref 39–117)
ALT: 29 U/L (ref 0–35)
AST: 27 U/L (ref 0–37)
BUN: 22 mg/dL (ref 6–23)
CALCIUM: 10.6 mg/dL — AB (ref 8.4–10.5)
CHLORIDE: 101 meq/L (ref 96–112)
CO2: 31 mEq/L (ref 19–32)
Creatinine, Ser: 1.25 mg/dL — ABNORMAL HIGH (ref 0.40–1.20)
GFR: 52.99 mL/min — AB (ref >60.00)
Glucose, Bld: 98 mg/dL (ref 70–99)
POTASSIUM: 4.6 meq/L (ref 3.5–5.1)
SODIUM: 139 meq/L (ref 135–145)
Total Bilirubin: 0.6 mg/dL (ref 0.2–1.2)
Total Protein: 8.9 g/dL — ABNORMAL HIGH (ref 6.0–8.3)

## 2016-04-17 LAB — URINALYSIS, ROUTINE W REFLEX MICROSCOPIC
BILIRUBIN URINE: NEGATIVE
Hgb urine dipstick: NEGATIVE
Ketones, ur: NEGATIVE
NITRITE: NEGATIVE
PH: 6.5 (ref 5.0–8.0)
RBC / HPF: NONE SEEN (ref 0–?)
Specific Gravity, Urine: 1.01 (ref 1.000–1.030)
TOTAL PROTEIN, URINE-UPE24: NEGATIVE
URINE GLUCOSE: NEGATIVE
Urobilinogen, UA: 0.2 (ref 0.0–1.0)

## 2016-04-17 LAB — LIPID PANEL
CHOLESTEROL: 206 mg/dL — AB (ref 0–200)
HDL: 58.7 mg/dL (ref >39.00)
LDL Cholesterol: 124 mg/dL — ABNORMAL HIGH (ref 0–99)
NonHDL: 147.5
Total CHOL/HDL Ratio: 4
Triglycerides: 116 mg/dL (ref 0.0–149.0)
VLDL: 23.2 mg/dL (ref 0.0–40.0)

## 2016-04-17 LAB — CBC
HCT: 39.1 % (ref 36.0–46.0)
Hemoglobin: 12.9 g/dL (ref 12.0–15.0)
MCHC: 33.1 g/dL (ref 30.0–36.0)
MCV: 89.9 fl (ref 78.0–100.0)
Platelets: 140 10*3/uL — ABNORMAL LOW (ref 150.0–400.0)
RBC: 4.35 Mil/uL (ref 3.87–5.11)
RDW: 12.8 % (ref 11.5–15.5)
WBC: 4.5 10*3/uL (ref 4.0–10.5)

## 2016-04-17 LAB — VITAMIN D 25 HYDROXY (VIT D DEFICIENCY, FRACTURES): VITD: 46.19 ng/mL (ref 30.00–100.00)

## 2016-04-17 LAB — TSH: TSH: 3.6 u[IU]/mL (ref 0.35–4.50)

## 2016-04-17 NOTE — Progress Notes (Signed)
Pre visit review using our clinic review tool, if applicable. No additional management support is needed unless otherwise documented below in the visit note. 

## 2016-04-17 NOTE — Patient Instructions (Addendum)
Increase Hydralazine to twice a day from once a day   [employer] would likely sponsor the [place] for Adults, Female A healthy lifestyle and preventive care can promote health and wellness. Preventive health guidelines for women include the following key practices.  A routine yearly physical is a good way to check with your health care provider about your health and preventive screening. It is a chance to share any concerns and updates on your health and to receive a thorough exam.  Visit your dentist for a routine exam and preventive care every 6 months. Brush your teeth twice a day and floss once a day. Good oral hygiene prevents tooth decay and gum disease.  The frequency of eye exams is based on your age, health, family medical history, use of contact lenses, and other factors. Follow your health care provider's recommendations for frequency of eye exams.  Eat a healthy diet. Foods like vegetables, fruits, whole grains, low-fat dairy products, and lean protein foods contain the nutrients you need without too many calories. Decrease your intake of foods high in solid fats, added sugars, and salt. Eat the right amount of calories for you.Get information about a proper diet from your health care provider, if necessary.  Regular physical exercise is one of the most important things you can do for your health. Most adults should get at least 150 minutes of moderate-intensity exercise (any activity that increases your heart rate and causes you to sweat) each week. In addition, most adults need muscle-strengthening exercises on 2 or more days a week.  Maintain a healthy weight. The body mass index (BMI) is a screening tool to identify possible weight problems. It provides an estimate of body fat based on height and weight. Your health care provider can find your BMI and can help you achieve or maintain a healthy weight.For adults 20 years and older:  A BMI below  18.5 is considered underweight.  A BMI of 18.5 to 24.9 is normal.  A BMI of 25 to 29.9 is considered overweight.  A BMI of 30 and above is considered obese.  Maintain normal blood lipids and cholesterol levels by exercising and minimizing your intake of saturated fat. Eat a balanced diet with plenty of fruit and vegetables. Blood tests for lipids and cholesterol should begin at age 66 and be repeated every 5 years. If your lipid or cholesterol levels are high, you are over 50, or you are at high risk for heart disease, you may need your cholesterol levels checked more frequently.Ongoing high lipid and cholesterol levels should be treated with medicines if diet and exercise are not working.  If you smoke, find out from your health care provider how to quit. If you do not use tobacco, do not start.  Lung cancer screening is recommended for adults aged 30-80 years who are at high risk for developing lung cancer because of a history of smoking. A yearly low-dose CT scan of the lungs is recommended for people who have at least a 30-pack-year history of smoking and are a current smoker or have quit within the past 15 years. A pack year of smoking is smoking an average of 1 pack of cigarettes a day for 1 year (for example: 1 pack a day for 30 years or 2 packs a day for 15 years). Yearly screening should continue until the smoker has stopped smoking for at least 15 years. Yearly screening should be stopped for people who develop a health  problem that would prevent them from having lung cancer treatment.  If you are pregnant, do not drink alcohol. If you are breastfeeding, be very cautious about drinking alcohol. If you are not pregnant and choose to drink alcohol, do not have more than 1 drink per day. One drink is considered to be 12 ounces (355 mL) of beer, 5 ounces (148 mL) of wine, or 1.5 ounces (44 mL) of liquor.  Avoid use of street drugs. Do not share needles with anyone. Ask for help if you need  support or instructions about stopping the use of drugs.  High blood pressure causes heart disease and increases the risk of stroke. Your blood pressure should be checked at least every 1 to 2 years. Ongoing high blood pressure should be treated with medicines if weight loss and exercise do not work.  If you are 74-34 years old, ask your health care provider if you should take aspirin to prevent strokes.  Diabetes screening is done by taking a blood sample to check your blood glucose level after you have not eaten for a certain period of time (fasting). If you are not overweight and you do not have risk factors for diabetes, you should be screened once every 3 years starting at age 68. If you are overweight or obese and you are 28-66 years of age, you should be screened for diabetes every year as part of your cardiovascular risk assessment.  Breast cancer screening is essential preventive care for women. You should practice "breast self-awareness." This means understanding the normal appearance and feel of your breasts and may include breast self-examination. Any changes detected, no matter how small, should be reported to a health care provider. Women in their 56s and 30s should have a clinical breast exam (CBE) by a health care provider as part of a regular health exam every 1 to 3 years. After age 56, women should have a CBE every year. Starting at age 43, women should consider having a mammogram (breast X-ray test) every year. Women who have a family history of breast cancer should talk to their health care provider about genetic screening. Women at a high risk of breast cancer should talk to their health care providers about having an MRI and a mammogram every year.  Breast cancer gene (BRCA)-related cancer risk assessment is recommended for women who have family members with BRCA-related cancers. BRCA-related cancers include breast, ovarian, tubal, and peritoneal cancers. Having family members with  these cancers may be associated with an increased risk for harmful changes (mutations) in the breast cancer genes BRCA1 and BRCA2. Results of the assessment will determine the need for genetic counseling and BRCA1 and BRCA2 testing.  Your health care provider may recommend that you be screened regularly for cancer of the pelvic organs (ovaries, uterus, and vagina). This screening involves a pelvic examination, including checking for microscopic changes to the surface of your cervix (Pap test). You may be encouraged to have this screening done every 3 years, beginning at age 18.  For women ages 24-65, health care providers may recommend pelvic exams and Pap testing every 3 years, or they may recommend the Pap and pelvic exam, combined with testing for human papilloma virus (HPV), every 5 years. Some types of HPV increase your risk of cervical cancer. Testing for HPV may also be done on women of any age with unclear Pap test results.  Other health care providers may not recommend any screening for nonpregnant women who are considered low risk  for pelvic cancer and who do not have symptoms. Ask your health care provider if a screening pelvic exam is right for you.  If you have had past treatment for cervical cancer or a condition that could lead to cancer, you need Pap tests and screening for cancer for at least 20 years after your treatment. If Pap tests have been discontinued, your risk factors (such as having a new sexual partner) need to be reassessed to determine if screening should resume. Some women have medical problems that increase the chance of getting cervical cancer. In these cases, your health care provider may recommend more frequent screening and Pap tests.  Colorectal cancer can be detected and often prevented. Most routine colorectal cancer screening begins at the age of 32 years and continues through age 30 years. However, your health care provider may recommend screening at an earlier age  if you have risk factors for colon cancer. On a yearly basis, your health care provider may provide home test kits to check for hidden blood in the stool. Use of a small camera at the end of a tube, to directly examine the colon (sigmoidoscopy or colonoscopy), can detect the earliest forms of colorectal cancer. Talk to your health care provider about this at age 29, when routine screening begins. Direct exam of the colon should be repeated every 5-10 years through age 35 years, unless early forms of precancerous polyps or small growths are found.  People who are at an increased risk for hepatitis B should be screened for this virus. You are considered at high risk for hepatitis B if:  You were born in a country where hepatitis B occurs often. Talk with your health care provider about which countries are considered high risk.  Your parents were born in a high-risk country and you have not received a shot to protect against hepatitis B (hepatitis B vaccine).  You have HIV or AIDS.  You use needles to inject street drugs.  You live with, or have sex with, someone who has hepatitis B.  You get hemodialysis treatment.  You take certain medicines for conditions like cancer, organ transplantation, and autoimmune conditions.  Hepatitis C blood testing is recommended for all people born from [birth year] through 1965 and any individual with known risks for hepatitis C.  Practice safe sex. Use condoms and avoid high-risk sexual practices to reduce the spread of sexually transmitted infections (STIs). STIs include gonorrhea, chlamydia, syphilis, trichomonas, herpes, HPV, and human immunodeficiency virus (HIV). Herpes, HIV, and HPV are viral illnesses that have no cure. They can result in disability, cancer, and death.  You should be screened for sexually transmitted illnesses (STIs) including gonorrhea and chlamydia if:  You are sexually active and are younger than 24 years.  You are older than 24 years and  your health care provider tells you that you are at risk for this type of infection.  Your sexual activity has changed since you were last screened and you are at an increased risk for chlamydia or gonorrhea. Ask your health care provider if you are at risk.  If you are at risk of being infected with HIV, it is recommended that you take a prescription medicine daily to prevent HIV infection. This is called preexposure prophylaxis (PrEP). You are considered at risk if:  You are sexually active and do not regularly use condoms or know the HIV status of your partner(s).  You take drugs by injection.  You are sexually active with a partner who  has HIV.  Talk with your health care provider about whether you are at high risk of being infected with HIV. If you choose to begin PrEP, you should first be tested for HIV. You should then be tested every 3 months for as long as you are taking PrEP.  Osteoporosis is a disease in which the bones lose minerals and strength with aging. This can result in serious bone fractures or breaks. The risk of osteoporosis can be identified using a bone density scan. Women ages 45 years and over and women at risk for fractures or osteoporosis should discuss screening with their health care providers. Ask your health care provider whether you should take a calcium supplement or vitamin D to reduce the rate of osteoporosis.  Menopause can be associated with physical symptoms and risks. Hormone replacement therapy is available to decrease symptoms and risks. You should talk to your health care provider about whether hormone replacement therapy is right for you.  Use sunscreen. Apply sunscreen liberally and repeatedly throughout the day. You should seek shade when your shadow is shorter than you. Protect yourself by wearing long sleeves, pants, a wide-brimmed hat, and sunglasses year round, whenever you are outdoors.  Once a month, do a whole body skin exam, using a mirror to  look at the skin on your back. Tell your health care provider of new moles, moles that have irregular borders, moles that are larger than a pencil eraser, or moles that have changed in shape or color.  Stay current with required vaccines (immunizations).  Influenza vaccine. All adults should be immunized every year.  Tetanus, diphtheria, and acellular pertussis (Td, Tdap) vaccine. Pregnant women should receive 1 dose of Tdap vaccine during each pregnancy. The dose should be obtained regardless of the length of time since the last dose. Immunization is preferred during the 27th-36th week of gestation. An adult who has not previously received Tdap or who does not know her vaccine status should receive 1 dose of Tdap. This initial dose should be followed by tetanus and diphtheria toxoids (Td) booster doses every 10 years. Adults with an unknown or incomplete history of completing a 3-dose immunization series with Td-containing vaccines should begin or complete a primary immunization series including a Tdap dose. Adults should receive a Td booster every 10 years.  Varicella vaccine. An adult without evidence of immunity to varicella should receive 2 doses or a second dose if she has previously received 1 dose. Pregnant females who do not have evidence of immunity should receive the first dose after pregnancy. This first dose should be obtained before leaving the health care facility. The second dose should be obtained 4-8 weeks after the first dose.  Human papillomavirus (HPV) vaccine. Females aged 13-26 years who have not received the vaccine previously should obtain the 3-dose series. The vaccine is not recommended for use in pregnant females. However, pregnancy testing is not needed before receiving a dose. If a female is found to be pregnant after receiving a dose, no treatment is needed. In that case, the remaining doses should be delayed until after the pregnancy. Immunization is recommended for any  person with an immunocompromised condition through the age of 25 years if she did not get any or all doses earlier. During the 3-dose series, the second dose should be obtained 4-8 weeks after the first dose. The third dose should be obtained 24 weeks after the first dose and 16 weeks after the second dose.  Zoster vaccine. One dose is  recommended for adults aged 22 years or older unless certain conditions are present.  Measles, mumps, and rubella (MMR) vaccine. Adults born before [birth year] generally are considered immune to measles and mumps. Adults born in [birth year] or later should have 1 or more doses of MMR vaccine unless there is a contraindication to the vaccine or there is laboratory evidence of immunity to each of the three diseases. A routine second dose of MMR vaccine should be obtained at least 28 days after the first dose for students attending postsecondary schools, health care workers, or international travelers. People who received inactivated measles vaccine or an unknown type of measles vaccine during 1963-1967 should receive 2 doses of MMR vaccine. People who received inactivated mumps vaccine or an unknown type of mumps vaccine before 1979 and are at high risk for mumps infection should consider immunization with 2 doses of MMR vaccine. For females of childbearing age, rubella immunity should be determined. If there is no evidence of immunity, females who are not pregnant should be vaccinated. If there is no evidence of immunity, females who are pregnant should delay immunization until after pregnancy. Unvaccinated health care workers born before [birth year] who lack laboratory evidence of measles, mumps, or rubella immunity or laboratory confirmation of disease should consider measles and mumps immunization with 2 doses of MMR vaccine or rubella immunization with 1 dose of MMR vaccine.  Pneumococcal 13-valent conjugate (PCV13) vaccine. When indicated, a person who is uncertain of his immunization history  and has no record of immunization should receive the PCV13 vaccine. All adults 24 years of age and older should receive this vaccine. An adult aged 30 years or older who has certain medical conditions and has not been previously immunized should receive 1 dose of PCV13 vaccine. This PCV13 should be followed with a dose of pneumococcal polysaccharide (PPSV23) vaccine. Adults who are at high risk for pneumococcal disease should obtain the PPSV23 vaccine at least 8 weeks after the dose of PCV13 vaccine. Adults older than 80 years of age who have normal immune system function should obtain the PPSV23 vaccine dose at least 1 year after the dose of PCV13 vaccine.  Pneumococcal polysaccharide (PPSV23) vaccine. When PCV13 is also indicated, PCV13 should be obtained first. All adults aged 58 years and older should be immunized. An adult younger than age 91 years who has certain medical conditions should be immunized. Any person who resides in a nursing home or long-term care facility should be immunized. An adult smoker should be immunized. People with an immunocompromised condition and certain other conditions should receive both PCV13 and PPSV23 vaccines. People with human immunodeficiency virus (HIV) infection should be immunized as soon as possible after diagnosis. Immunization during chemotherapy or radiation therapy should be avoided. Routine use of PPSV23 vaccine is not recommended for American Indians, [place], or people younger than 65 years unless there are medical conditions that require PPSV23 vaccine. When indicated, people who have unknown immunization and have no record of immunization should receive PPSV23 vaccine. One-time revaccination 5 years after the first dose of PPSV23 is recommended for people aged 19-64 years who have chronic kidney failure, nephrotic syndrome, asplenia, or immunocompromised conditions. People who received 1-2 doses of PPSV23 before age 97 years should receive another dose  of PPSV23 vaccine at age 40 years or later if at least 5 years have passed since the previous dose. Doses of PPSV23 are not needed for people immunized with PPSV23 at or after age 23 years.  Meningococcal vaccine. Adults  with asplenia or persistent complement component deficiencies should receive 2 doses of quadrivalent meningococcal conjugate (MenACWY-D) vaccine. The doses should be obtained at least 2 months apart. Microbiologists working with certain meningococcal bacteria, [employer] recruits, people at risk during an outbreak, and people who travel to or live in countries with a high rate of meningitis should be immunized. A first-year college student up through age 12 years who is living in a residence hall should receive a dose if she did not receive a dose on or after her 16th birthday. Adults who have certain high-risk conditions should receive one or more doses of vaccine.  Hepatitis A vaccine. Adults who wish to be protected from this disease, have certain high-risk conditions, work with hepatitis A-infected animals, work in hepatitis A research labs, or travel to or work in countries with a high rate of hepatitis A should be immunized. Adults who were previously unvaccinated and who anticipate close contact with an international adoptee during the first 60 days after arrival in the [place] from a country with a high rate of hepatitis A should be immunized.  Hepatitis B vaccine. Adults who wish to be protected from this disease, have certain high-risk conditions, may be exposed to blood or other infectious body fluids, are household contacts or sex partners of hepatitis B positive people, are clients or workers in certain care facilities, or travel to or work in countries with a high rate of hepatitis B should be immunized.  Haemophilus influenzae type b (Hib) vaccine. A previously unvaccinated person with asplenia or sickle cell disease or having a scheduled splenectomy should receive 1 dose  of Hib vaccine. Regardless of previous immunization, a recipient of a hematopoietic stem cell transplant should receive a 3-dose series 6-12 months after her successful transplant. Hib vaccine is not recommended for adults with HIV infection. Preventive Services / Frequency Ages 79 to 42 years  Blood pressure check.** / Every 3-5 years.  Lipid and cholesterol check.** / Every 5 years beginning at age 32.  Clinical breast exam.** / Every 3 years for women in their 72s and 70s.  BRCA-related cancer risk assessment.** / For women who have family members with a BRCA-related cancer (breast, ovarian, tubal, or peritoneal cancers).  Pap test.** / Every 2 years from ages 78 through 60. Every 3 years starting at age 34 through age 54 or 3 with a history of 3 consecutive normal Pap tests.  HPV screening.** / Every 3 years from ages 60 through ages 82 to 73 with a history of 3 consecutive normal Pap tests.  Hepatitis C blood test.** / For any individual with known risks for hepatitis C.  Skin self-exam. / Monthly.  Influenza vaccine. / Every year.  Tetanus, diphtheria, and acellular pertussis (Tdap, Td) vaccine.** / Consult your health care provider. Pregnant women should receive 1 dose of Tdap vaccine during each pregnancy. 1 dose of Td every 10 years.  Varicella vaccine.** / Consult your health care provider. Pregnant females who do not have evidence of immunity should receive the first dose after pregnancy.  HPV vaccine. / 3 doses over 6 months, if 28 and younger. The vaccine is not recommended for use in pregnant females. However, pregnancy testing is not needed before receiving a dose.  Measles, mumps, rubella (MMR) vaccine.** / You need at least 1 dose of MMR if you were born in 1957 or later. You may also need a 2nd dose. For females of childbearing age, rubella immunity should be determined. If there is  no evidence of immunity, females who are not pregnant should be vaccinated. If there is  no evidence of immunity, females who are pregnant should delay immunization until after pregnancy.  Pneumococcal 13-valent conjugate (PCV13) vaccine.** / Consult your health care provider.  Pneumococcal polysaccharide (PPSV23) vaccine.** / 1 to 2 doses if you smoke cigarettes or if you have certain conditions.  Meningococcal vaccine.** / 1 dose if you are age 42 to 64 years and a Market researcher living in a residence hall, or have one of several medical conditions, you need to get vaccinated against meningococcal disease. You may also need additional booster doses.  Hepatitis A vaccine.** / Consult your health care provider.  Hepatitis B vaccine.** / Consult your health care provider.  Haemophilus influenzae type b (Hib) vaccine.** / Consult your health care provider. Ages 89 to 42 years  Blood pressure check.** / Every year.  Lipid and cholesterol check.** / Every 5 years beginning at age 66 years.  Lung cancer screening. / Every year if you are aged 74-80 years and have a 30-pack-year history of smoking and currently smoke or have quit within the past 15 years. Yearly screening is stopped once you have quit smoking for at least 15 years or develop a health problem that would prevent you from having lung cancer treatment.  Clinical breast exam.** / Every year after age 19 years.  BRCA-related cancer risk assessment.** / For women who have family members with a BRCA-related cancer (breast, ovarian, tubal, or peritoneal cancers).  Mammogram.** / Every year beginning at age 49 years and continuing for as long as you are in good health. Consult with your health care provider.  Pap test.** / Every 3 years starting at age 51 years through age 78 or 80 years with a history of 3 consecutive normal Pap tests.  HPV screening.** / Every 3 years from ages 38 years through ages 59 to 56 years with a history of 3 consecutive normal Pap tests.  Fecal occult blood test (FOBT) of stool. /  Every year beginning at age 75 years and continuing until age 65 years. You may not need to do this test if you get a colonoscopy every 10 years.  Flexible sigmoidoscopy or colonoscopy.** / Every 5 years for a flexible sigmoidoscopy or every 10 years for a colonoscopy beginning at age 69 years and continuing until age 63 years.  Hepatitis C blood test.** / For all people born from [birth year] through 1965 and any individual with known risks for hepatitis C.  Skin self-exam. / Monthly.  Influenza vaccine. / Every year.  Tetanus, diphtheria, and acellular pertussis (Tdap/Td) vaccine.** / Consult your health care provider. Pregnant women should receive 1 dose of Tdap vaccine during each pregnancy. 1 dose of Td every 10 years.  Varicella vaccine.** / Consult your health care provider. Pregnant females who do not have evidence of immunity should receive the first dose after pregnancy.  Zoster vaccine.** / 1 dose for adults aged 27 years or older.  Measles, mumps, rubella (MMR) vaccine.** / You need at least 1 dose of MMR if you were born in 1957 or later. You may also need a second dose. For females of childbearing age, rubella immunity should be determined. If there is no evidence of immunity, females who are not pregnant should be vaccinated. If there is no evidence of immunity, females who are pregnant should delay immunization until after pregnancy.  Pneumococcal 13-valent conjugate (PCV13) vaccine.** / Consult your health care provider.  Pneumococcal  polysaccharide (PPSV23) vaccine.** / 1 to 2 doses if you smoke cigarettes or if you have certain conditions.  Meningococcal vaccine.** / Consult your health care provider.  Hepatitis A vaccine.** / Consult your health care provider.  Hepatitis B vaccine.** / Consult your health care provider.  Haemophilus influenzae type b (Hib) vaccine.** / Consult your health care provider. Ages 60 years and over  Blood pressure check.** / Every year.  Lipid  and cholesterol check.** / Every 5 years beginning at age 3 years.  Lung cancer screening. / Every year if you are aged 90-80 years and have a 30-pack-year history of smoking and currently smoke or have quit within the past 15 years. Yearly screening is stopped once you have quit smoking for at least 15 years or develop a health problem that would prevent you from having lung cancer treatment.  Clinical breast exam.** / Every year after age 29 years.  BRCA-related cancer risk assessment.** / For women who have family members with a BRCA-related cancer (breast, ovarian, tubal, or peritoneal cancers).  Mammogram.** / Every year beginning at age 65 years and continuing for as long as you are in good health. Consult with your health care provider.  Pap test.** / Every 3 years starting at age 43 years through age 27 or 87 years with 3 consecutive normal Pap tests. Testing can be stopped between 65 and 70 years with 3 consecutive normal Pap tests and no abnormal Pap or HPV tests in the past 10 years.  HPV screening.** / Every 3 years from ages 46 years through ages 71 or 49 years with a history of 3 consecutive normal Pap tests. Testing can be stopped between 65 and 70 years with 3 consecutive normal Pap tests and no abnormal Pap or HPV tests in the past 10 years.  Fecal occult blood test (FOBT) of stool. / Every year beginning at age 49 years and continuing until age 75 years. You may not need to do this test if you get a colonoscopy every 10 years.  Flexible sigmoidoscopy or colonoscopy.** / Every 5 years for a flexible sigmoidoscopy or every 10 years for a colonoscopy beginning at age 48 years and continuing until age 70 years.  Hepatitis C blood test.** / For all people born from [birth year] through 1965 and any individual with known risks for hepatitis C.  Osteoporosis screening.** / A one-time screening for women ages 56 years and over and women at risk for fractures or osteoporosis.  Skin  self-exam. / Monthly.  Influenza vaccine. / Every year.  Tetanus, diphtheria, and acellular pertussis (Tdap/Td) vaccine.** / 1 dose of Td every 10 years.  Varicella vaccine.** / Consult your health care provider.  Zoster vaccine.** / 1 dose for adults aged 2 years or older.  Pneumococcal 13-valent conjugate (PCV13) vaccine.** / Consult your health care provider.  Pneumococcal polysaccharide (PPSV23) vaccine.** / 1 dose for all adults aged 68 years and older.  Meningococcal vaccine.** / Consult your health care provider.  Hepatitis A vaccine.** / Consult your health care provider.  Hepatitis B vaccine.** / Consult your health care provider.  Haemophilus influenzae type b (Hib) vaccine.** / Consult your health care provider. ** Family history and personal history of risk and conditions may change your health care provider's recommendations.   This information is not intended to replace advice given to you by your health care provider. Make sure you discuss any questions you have with your health care provider.   Document Released: 12/29/2001 Document Revised: 11/23/2014  Document Reviewed: 03/30/2011 Elsevier Interactive Patient Education [employer].

## 2016-04-20 ENCOUNTER — Other Ambulatory Visit: Payer: Self-pay | Admitting: Family Medicine

## 2016-04-20 DIAGNOSIS — N289 Disorder of kidney and ureter, unspecified: Secondary | ICD-10-CM

## 2016-04-26 NOTE — Assessment & Plan Note (Signed)
Well supplemented, continue the same

## 2016-04-26 NOTE — Assessment & Plan Note (Signed)
On Levothyroxine, continue to monitor 

## 2016-04-26 NOTE — Assessment & Plan Note (Signed)
Encouraged heart healthy diet, increase exercise, avoid trans fats, consider a krill oil cap daily 

## 2016-04-26 NOTE — Assessment & Plan Note (Signed)

## 2016-04-26 NOTE — Assessment & Plan Note (Signed)
hgba1c acceptable, minimize simple carbs. Increase exercise as tolerated.  

## 2016-04-26 NOTE — Assessment & Plan Note (Signed)
Well controlled, no changes to meds. Encouraged heart healthy diet such as the DASH diet and exercise as tolerated.  °

## 2016-04-26 NOTE — Progress Notes (Signed)
Patient ID: Crystal [name], female   DOB: [birth year], 82 y.o.   MRN: [id number]   Subjective:    Patient ID: Crystal [name], female    DOB: 08-03-1934, 82 y.o.   MRN: [id number]  Chief Complaint  Patient presents with  . Medicare Wellness    HPI Patient is in today for follow up and annual exam. No recent illness or acute concerns. 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 She reports she did well. She reports doin well with ADLs.  She declines dexa scan, tdap and pneumonia shot today. Denies CP/palp/SOB/HA/congestion/fevers/GI or GU c/o. Taking meds as prescribed Past Medical History  Diagnosis Date  . Cardiomyopathy     non ischemic NL cos on cath 08/2009, EF to 15-20%, Her follow up  2D echo  in 10/2009 showed impoved EFo 35-40%  . CHF (congestive heart failure) ([place])   . Hypertension   . Anxiety   . History of lymphoma     Dr Martha Clan  . Hypothyroid   . Medicare annual wellness visit, subsequent 12/14/2014    Follows with Dr Marin Olp Follows with Dr Deatra Ina, gastroenterology, last colonoscopy in 2012, repeat in 2017 Last Pap 2011, always normal, no need for repeat Last MGM in 2011, no concerns, declines for now Follows with Dr Stanford Breed of cardiology   . Vitamin D deficiency 10/17/2015    Past Surgical History  Procedure Laterality Date  . Tonsillectomy  1952  . Dilation and curettage of uterus    . Nasal polyps removed      age 25    Family History  Problem Relation Age of Onset  . Diabetes Mother     deceased secondary to diabetes  . Hypertension Mother   . Vision loss Mother   . Pneumonia Father     died age 36 due to complications of pneumonia  . Colon polyps Father   . Liver disease Daughter   . Sarcoidosis Daughter   . Diabetes Sister   . Other      no obvious premature cardiovascular disease or familial cardiomyopathy is noted  . Colon cancer Brother 62  . Cancer Brother   . Obesity Son   . Alcohol abuse Neg Hx   . Diabetes  Brother   . Kidney disease Brother   . Diabetes Sister   . Sarcoidosis Sister   . Arthritis Sister   . Arthritis Sister   . Diabetes Sister   . Arthritis Sister   . Diabetes Sister   . Hypertension Daughter   . Hypertension Daughter     Social History   Social History  . Marital Status: Married    Spouse Name: N/A  . Number of Children: N/A  . Years of Education: N/A   Occupational History  . Not on file.   Social History Main Topics  . Smoking status: Never Smoker   . Smokeless tobacco: Never Used     Comment: never used tobacco  . Alcohol Use: No  . Drug Use: No  . Sexual Activity: Not on file     Comment: lives with husband, no dietary restrictions, minimizes dairy   Other Topics Concern  . Not on file   Social History Narrative   The patient is married ( husband Crystal [name])  She just recently celebrated     her 52nd anniversary.  She has five children.  There is no active     tobacco or alcohol use history.  Smoking Status:  never   Caffeine use/day:  None   Does Patient Exercise:  yes    Outpatient Prescriptions Prior to Visit  Medication Sig Dispense Refill  . bisoprolol (ZEBETA) 10 MG tablet take 1 tablet by mouth once daily 30 tablet 5  . furosemide (LASIX) 20 MG tablet Take 1 tablet (20 mg total) by mouth daily. 30 tablet 0  . hydrALAZINE (APRESOLINE) 10 MG tablet Take 1 tablet (10 mg total) by mouth 3 (three) times daily. 90 tablet 12  . isosorbide mononitrate (IMDUR) 30 MG 24 hr tablet Take 0.5 tablets (15 mg total) by mouth daily. 45 tablet 3  . levothyroxine (SYNTHROID, LEVOTHROID) 25 MCG tablet take 1/2 tablet by mouth once daily 30 tablet 5  . Multiple Vitamins-Minerals (CENTRUM PO) Take 1 tablet by mouth daily.     . Omega-3 Fatty Acids (FISH OIL PO) Take 1 tablet by mouth daily.    Marland Kitchen spironolactone (ALDACTONE) 25 MG tablet take 1 tablet by mouth once daily 30 tablet 4  . levothyroxine (SYNTHROID, LEVOTHROID) 25 MCG tablet take 1/2  tablet by mouth once daily 30 tablet 5  . spironolactone (ALDACTONE) 25 MG tablet Take 1 tablet (25 mg total) by mouth daily. 30 tablet 0   No facility-administered medications prior to visit.    Allergies  Allergen Reactions  . Lisinopril Swelling    Facial and lip swelling  . Iohexol      Desc: hives,itiching     Review of Systems  Constitutional: Negative for fever and malaise/fatigue.  HENT: Negative for congestion.   Eyes: Negative for blurred vision.  Respiratory: Negative for shortness of breath.   Cardiovascular: Negative for chest pain, palpitations and leg swelling.  Gastrointestinal: Negative for nausea, abdominal pain and blood in stool.  Genitourinary: Negative for dysuria and frequency.  Musculoskeletal: Negative for falls.  Skin: Negative for rash.  Neurological: Negative for dizziness, loss of consciousness and headaches.  Endo/Heme/Allergies: Negative for environmental allergies.  Psychiatric/Behavioral: Negative for depression. The patient is not nervous/anxious.        Objective:    Physical Exam  Constitutional: She is oriented to person, place, and time. She appears well-developed and well-nourished. No distress.  HENT:  Head: Normocephalic and atraumatic.  Nose: Nose normal.  Eyes: Right eye exhibits no discharge. Left eye exhibits no discharge.  Neck: Normal range of motion. Neck supple.  Cardiovascular: Normal rate and regular rhythm.   Pulmonary/Chest: Effort normal and breath sounds normal.  Abdominal: Soft. Bowel sounds are normal. There is no tenderness.  Musculoskeletal: She exhibits no edema.  Neurological: She is alert and oriented to person, place, and time.  Skin: Skin is warm and dry.  Psychiatric: She has a normal mood and affect.  Nursing note and vitals reviewed.   BP 108/62 mmHg  Pulse 63  Temp(Src) 97.7 F (36.5 C) (Oral)  Ht '5\' 3"'$  (1.6 m)  Wt 134 lb 6 oz (60.952 kg)  BMI 23.81 kg/m2  SpO2 99% Wt Readings from Last 3  Encounters:  04/17/16 134 lb 6 oz (60.952 kg)  01/30/16 133 lb (60.328 kg)  01/23/16 132 lb (59.875 kg)     Lab Results  Component Value Date   WBC 4.5 04/17/2016   HGB 12.9 04/17/2016   HCT 39.1 04/17/2016   PLT 140.0* 04/17/2016   GLUCOSE 98 04/17/2016   CHOL 206* 04/17/2016   TRIG 116.0 04/17/2016   HDL 58.70 04/17/2016   LDLCALC 124* 04/17/2016   ALT 29 04/17/2016  AST 27 04/17/2016   NA 139 04/17/2016   K 4.6 04/17/2016   CL 101 04/17/2016   CREATININE 1.25* 04/17/2016   BUN 22 04/17/2016   CO2 31 04/17/2016   TSH 3.60 04/17/2016   INR 1.08 09/05/2009   HGBA1C 5.7 10/17/2015    Lab Results  Component Value Date   TSH 3.60 04/17/2016   Lab Results  Component Value Date   WBC 4.5 04/17/2016   HGB 12.9 04/17/2016   HCT 39.1 04/17/2016   MCV 89.9 04/17/2016   PLT 140.0* 04/17/2016   Lab Results  Component Value Date   NA 139 04/17/2016   K 4.6 04/17/2016   CO2 31 04/17/2016   GLUCOSE 98 04/17/2016   BUN 22 04/17/2016   CREATININE 1.25* 04/17/2016   BILITOT 0.6 04/17/2016   ALKPHOS 109 04/17/2016   AST 27 04/17/2016   ALT 29 04/17/2016   PROT 8.9* 04/17/2016   ALBUMIN 4.1 04/17/2016   CALCIUM 10.6* 04/17/2016   ANIONGAP 8 01/20/2016   GFR 52.99* 04/17/2016   Lab Results  Component Value Date   CHOL 206* 04/17/2016   Lab Results  Component Value Date   HDL 58.70 04/17/2016   Lab Results  Component Value Date   LDLCALC 124* 04/17/2016   Lab Results  Component Value Date   TRIG 116.0 04/17/2016   Lab Results  Component Value Date   CHOLHDL 4 04/17/2016   Lab Results  Component Value Date   HGBA1C 5.7 10/17/2015       Assessment & Plan:   Problem List Items Addressed This Visit    Vitamin D deficiency - Primary    Well supplemented, continue the same      Relevant Orders   CBC (Completed)   TSH (Completed)   Lipid panel (Completed)   Comprehensive metabolic panel (Completed)   Vitamin D (25 hydroxy) (Completed)    Thrombocytopenia ([place])   Relevant Orders   CBC (Completed)   TSH (Completed)   Lipid panel (Completed)   Comprehensive metabolic panel (Completed)   Vitamin D (25 hydroxy) (Completed)   Medicare annual wellness visit, subsequent    Patient denies any difficulties at home. No trouble with ADLs, depression or falls. See EMR for functional status screen and depression screen. No recent changes to vision or hearing. Is UTD with immunizations. Is UTD with screening. Discussed Advanced Directives. Encouraged heart healthy diet, exercise as tolerated and adequate sleep. See patient's problem list for health risk factors to monitor. See AVS for preventative healthcare recommendation schedule. Given and reviewed copy of ACP documents from [employer] and encouraged to complete and return Labs reviewed.       Hypothyroidism    On Levothyroxine, continue to monitor      Relevant Orders   CBC (Completed)   TSH (Completed)   Lipid panel (Completed)   Comprehensive metabolic panel (Completed)   Vitamin D (25 hydroxy) (Completed)   Hyperlipidemia, mixed    Encouraged heart healthy diet, increase exercise, avoid trans fats, consider a krill oil cap daily      Relevant Orders   CBC (Completed)   TSH (Completed)   Lipid panel (Completed)   Comprehensive metabolic panel (Completed)   Vitamin D (25 hydroxy) (Completed)   Hyperglycemia    hgba1c acceptable, minimize simple carbs. Increase exercise as tolerated.       Relevant Orders   CBC (Completed)   TSH (Completed)   Lipid panel (Completed)   Comprehensive metabolic panel (Completed)  Vitamin D (25 hydroxy) (Completed)   Hematuria   Relevant Orders   Urinalysis   Essential hypertension    Well controlled, no changes to meds. Encouraged heart healthy diet such as the DASH diet and exercise as tolerated.          I am having Ms. Belflower maintain her Multiple Vitamins-Minerals (CENTRUM PO), Omega-3 Fatty Acids (FISH OIL PO),  isosorbide mononitrate, bisoprolol, furosemide, hydrALAZINE, spironolactone, and levothyroxine.  No orders of the defined types were placed in this encounter.     Penni Homans, MD

## 2016-05-04 ENCOUNTER — Other Ambulatory Visit: Payer: Self-pay | Admitting: Family Medicine

## 2016-05-04 NOTE — Progress Notes (Signed)
HPI: FU nonischemic cardiomyopathy. Cardiac catheterization in 2010 showed an ejection fraction of 15-20% and normal coronary arteries. Echocardiogram repeated January 2017 and showed ejection fraction 20-25%. There was restrictive filling. There was moderate to severe mitral regurgitation, mild left atrial enlargement, mildly reduced RV function and moderately elevated pulmonary pressures. Patient admitted with syncope recently. Event was felt to potentially be vagally mediated. However given reduced LV function I discussed the possibility of of life vest or ICD. However she declined and preferred only medical therapy. The risk of sudden death was explained. She was instructed not to drive for 6 months. Since last seen, the patient has dyspnea with more extreme activities but not with routine activities. It is relieved with rest. It is not associated with chest pain. There is no orthopnea, PND or pedal edema. There is no syncope or palpitations. There is no exertional chest pain.   Current Outpatient Prescriptions  Medication Sig Dispense Refill  . bisoprolol (ZEBETA) 10 MG tablet take 1 tablet by mouth once daily 30 tablet 5  . furosemide (LASIX) 20 MG tablet Take 1 tablet (20 mg total) by mouth daily. 30 tablet 0  . hydrALAZINE (APRESOLINE) 10 MG tablet Take 1 tablet (10 mg total) by mouth 3 (three) times daily. 90 tablet 12  . isosorbide mononitrate (IMDUR) 30 MG 24 hr tablet Take 0.5 tablets (15 mg total) by mouth daily. 45 tablet 3  . levothyroxine (SYNTHROID, LEVOTHROID) 25 MCG tablet take 1/2 tablet by mouth once daily 30 tablet 5  . Multiple Vitamins-Minerals (CENTRUM PO) Take 1 tablet by mouth daily.     . Omega-3 Fatty Acids (FISH OIL PO) Take 1 tablet by mouth daily.    Marland Kitchen spironolactone (ALDACTONE) 25 MG tablet take 1 tablet by mouth once daily 30 tablet 4   No current facility-administered medications for this visit.     Past Medical History  Diagnosis Date  .  Cardiomyopathy     non ischemic NL cos on cath 08/2009, EF to 15-20%, Her follow up  2D echo  in 10/2009 showed impoved EFo 35-40%  . CHF (congestive heart failure) ([place])   . Hypertension   . Anxiety   . History of lymphoma     Dr Martha Clan  . Hypothyroid   . Medicare annual wellness visit, subsequent 12/14/2014    Follows with Dr Marin Olp Follows with Dr Deatra Ina, gastroenterology, last colonoscopy in 2012, repeat in 2017 Last Pap 2011, always normal, no need for repeat Last MGM in 2011, no concerns, declines for now Follows with Dr Stanford Breed of cardiology   . Vitamin D deficiency 10/17/2015    Past Surgical History  Procedure Laterality Date  . Tonsillectomy  1952  . Dilation and curettage of uterus    . Nasal polyps removed      age 55    Social History   Social History  . Marital Status: Married    Spouse Name: N/A  . Number of Children: N/A  . Years of Education: N/A   Occupational History  . Not on file.   Social History Main Topics  . Smoking status: Never Smoker   . Smokeless tobacco: Never Used     Comment: never used tobacco  . Alcohol Use: No  . Drug Use: No  . Sexual Activity: Not on file     Comment: lives with husband, no dietary restrictions, minimizes dairy   Other Topics Concern  . Not on file   Social History Narrative  The patient is married ( husband Ethelmae Ringel)  She just recently celebrated     her 52nd anniversary.  She has five children.  There is no active     tobacco or alcohol use history.          Smoking Status:  never   Caffeine use/day:  None   Does Patient Exercise:  yes    Family History  Problem Relation Age of Onset  . Diabetes Mother     deceased secondary to diabetes  . Hypertension Mother   . Vision loss Mother   . Pneumonia Father     died age 61 due to complications of pneumonia  . Colon polyps Father   . Liver disease Daughter   . Sarcoidosis Daughter   . Diabetes Sister   . Other      no obvious premature  cardiovascular disease or familial cardiomyopathy is noted  . Colon cancer Brother 47  . Cancer Brother   . Obesity Son   . Alcohol abuse Neg Hx   . Diabetes Brother   . Kidney disease Brother   . Diabetes Sister   . Sarcoidosis Sister   . Arthritis Sister   . Arthritis Sister   . Diabetes Sister   . Arthritis Sister   . Diabetes Sister   . Hypertension Daughter   . Hypertension Daughter     ROS: no fevers or chills, productive cough, hemoptysis, dysphasia, odynophagia, melena, hematochezia, dysuria, hematuria, rash, seizure activity, orthopnea, PND, pedal edema, claudication. Remaining systems are negative.  Physical Exam: Well-developed well-nourished in no acute distress.  Skin is warm and dry.  HEENT is normal.  Neck is supple.  Chest is clear to auscultation with normal expansion.  Cardiovascular exam is regular rate and rhythm.  Abdominal exam nontender or distended. No masses palpated. Extremities show no edema. neuro grossly intact

## 2016-05-12 ENCOUNTER — Ambulatory Visit (INDEPENDENT_AMBULATORY_CARE_PROVIDER_SITE_OTHER): Payer: Medicare Other | Admitting: Cardiology

## 2016-05-12 ENCOUNTER — Encounter: Payer: Self-pay | Admitting: Cardiology

## 2016-05-12 VITALS — BP 134/60 | HR 70 | Ht 63.0 in | Wt 133.0 lb

## 2016-05-12 DIAGNOSIS — I5022 Chronic systolic (congestive) heart failure: Secondary | ICD-10-CM | POA: Diagnosis not present

## 2016-05-12 DIAGNOSIS — I10 Essential (primary) hypertension: Secondary | ICD-10-CM | POA: Diagnosis not present

## 2016-05-12 DIAGNOSIS — I429 Cardiomyopathy, unspecified: Secondary | ICD-10-CM

## 2016-05-12 DIAGNOSIS — I428 Other cardiomyopathies: Secondary | ICD-10-CM

## 2016-05-12 MED ORDER — CARVEDILOL 6.25 MG PO TABS: 6.2500 mg | ORAL_TABLET | Freq: Two times a day (BID) | ORAL | Status: DC

## 2016-05-12 NOTE — Patient Instructions (Signed)
Medication Instructions:   STOP BISOPROLOL  START CARVEDILOL 6.25 MG ONE TABLET TWICE DAILY  Follow-Up:  Your physician wants you to follow-up in: [place] will receive a reminder letter in the mail two months in advance. If you don't receive a letter, please call our office to schedule the follow-up appointment.   If you need a refill on your cardiac medications before your next appointment, please call your pharmacy.

## 2016-05-12 NOTE — Assessment & Plan Note (Signed)
Blood pressure controlled. Continue present medications. 

## 2016-05-12 NOTE — Assessment & Plan Note (Addendum)
Patient is euvolemic on examination. Continue present dose of diuretics. 

## 2016-05-12 NOTE — Assessment & Plan Note (Signed)
No recurrent episodes.Previously declined ICD and understands risk of sudden death.

## 2016-05-12 NOTE — Assessment & Plan Note (Signed)
Previous angioedema with lisinopril. Continue hydralazine/nitrates. Discontinue bisoprolol. Begin Coreg 6.25 mg twice a day. Advanced medications as tolerated by pulse and blood pressure.

## 2016-05-13 ENCOUNTER — Ambulatory Visit: Payer: Medicare Other | Admitting: Cardiology

## 2016-06-09 ENCOUNTER — Encounter: Payer: Self-pay | Admitting: Gastroenterology

## 2016-06-22 ENCOUNTER — Ambulatory Visit (INDEPENDENT_AMBULATORY_CARE_PROVIDER_SITE_OTHER): Payer: Medicare Other | Admitting: Family Medicine

## 2016-06-22 ENCOUNTER — Other Ambulatory Visit (INDEPENDENT_AMBULATORY_CARE_PROVIDER_SITE_OTHER): Payer: Medicare Other

## 2016-06-22 ENCOUNTER — Encounter: Payer: Self-pay | Admitting: Family Medicine

## 2016-06-22 VITALS — BP 110/50 | HR 100 | Temp 98.7°F | Resp 16 | Wt 133.0 lb

## 2016-06-22 DIAGNOSIS — R42 Dizziness and giddiness: Secondary | ICD-10-CM | POA: Diagnosis not present

## 2016-06-22 DIAGNOSIS — R82998 Other abnormal findings in urine: Secondary | ICD-10-CM

## 2016-06-22 DIAGNOSIS — E871 Hypo-osmolality and hyponatremia: Secondary | ICD-10-CM | POA: Diagnosis not present

## 2016-06-22 DIAGNOSIS — N289 Disorder of kidney and ureter, unspecified: Secondary | ICD-10-CM

## 2016-06-22 DIAGNOSIS — R748 Abnormal levels of other serum enzymes: Secondary | ICD-10-CM

## 2016-06-22 DIAGNOSIS — N390 Urinary tract infection, site not specified: Secondary | ICD-10-CM | POA: Diagnosis not present

## 2016-06-22 DIAGNOSIS — R7989 Other specified abnormal findings of blood chemistry: Secondary | ICD-10-CM

## 2016-06-22 LAB — COMPREHENSIVE METABOLIC PANEL
ALK PHOS: 101 U/L (ref 39–117)
ALT: 30 U/L (ref 0–35)
AST: 31 U/L (ref 0–37)
Albumin: 3.5 g/dL (ref 3.5–5.2)
BUN: 30 mg/dL — ABNORMAL HIGH (ref 6–23)
CHLORIDE: 97 meq/L (ref 96–112)
CO2: 26 meq/L (ref 19–32)
Calcium: 9.8 mg/dL (ref 8.4–10.5)
Creatinine, Ser: 1.79 mg/dL — ABNORMAL HIGH (ref 0.40–1.20)
GFR: 35 mL/min — AB (ref >60.00)
GLUCOSE: 168 mg/dL — AB (ref 70–99)
POTASSIUM: 4.2 meq/L (ref 3.5–5.1)
Sodium: 131 mEq/L — ABNORMAL LOW (ref 135–145)
Total Bilirubin: 0.9 mg/dL (ref 0.2–1.2)
Total Protein: 8.6 g/dL — ABNORMAL HIGH (ref 6.0–8.3)

## 2016-06-22 LAB — POCT URINALYSIS DIP (MANUAL ENTRY)
Bilirubin, UA: NEGATIVE
Blood, UA: NEGATIVE
Glucose, UA: NEGATIVE
Ketones, POC UA: NEGATIVE
Nitrite, UA: NEGATIVE
PROTEIN UA: NEGATIVE
SPEC GRAV UA: 1.02
UROBILINOGEN UA: NEGATIVE
pH, UA: 6

## 2016-06-22 LAB — VITAMIN D 25 HYDROXY (VIT D DEFICIENCY, FRACTURES): VITD: 50.85 ng/mL (ref 30.00–100.00)

## 2016-06-22 LAB — GLUCOSE, POCT (MANUAL RESULT ENTRY): POC Glucose: 168 mg/dl — AB (ref 70–99)

## 2016-06-22 MED ORDER — CEPHALEXIN 500 MG PO CAPS: 500.0000 mg | ORAL_CAPSULE | Freq: Two times a day (BID) | ORAL | 0 refills | Status: DC

## 2016-06-22 NOTE — Progress Notes (Addendum)
[place] at [facility] [address] [place], [place] [address] [phone]  Date:  06/22/2016   Name:  Crystal [name]   DOB:  [birth year]   MRN:  [id number]  PCP:  Penni Homans, MD    Chief Complaint: Dizziness (Pt w/ HA, dizziness, and cold chills yesterday. Continuing dizziness and HA today, denies CP, SOB, numbness/tingling.)   History of Present Illness:  Crystal [name] is a 82 y.o. very pleasant female patient who presents with the following:  Pt came in for routine labs today and was noted to have complaint of feeling dizzy.  RN was concerned and asked me to see pt today.    History of CHF and cardiomyopathy. Most recent EF 20-25% in January of this year  Yesterday she felt like she was hyperventilating (she is not sure why)- she breathed in a paper bag and started to feel better.  She also noted a headache and felt lightheaded "like I was going to fall out."  She rested and felt better.   She thought that maybe she was rushing around too much.  She had a lot to get done yesterday afternoon  Today she feels "much better, I still have sort of a headache."   No belly pain but she did have RLQ pain last week She ate oatmeal this am- the same thing she eats for breakfast every day No vomiting, no diarrhea, no fever.  No urinary sx such as hematuria, dysuia.  She did have a recent med change- about a month ago they stopped a BB and added spiro to her regimen for her history of cardiomyopathy and CHF She has not noted any CP or SOB She is able to breathe ok supine- she sleeps with one pillow at night.  No change in her number of pillows No weight gain No ankle swelling She tries to avoid salt as much as she can  She notes good exercise tolerance and has participated in several events in the [employer] senior games including 5K walk this year.   BP Readings from Last 3 Encounters:  06/22/16 (!) 110/50  05/12/16 134/60  04/17/16  108/62   Wt Readings from Last 3 Encounters:  06/22/16 133 lb (60.3 kg)  05/12/16 133 lb (60.3 kg)  04/17/16 134 lb 6 oz (61 kg)     Patient Active Problem List   Diagnosis Date Noted  . Bigeminy   . Syncope 01/19/2016  . Mitral valve regurgitation, Moderate to severe 11/26/2015  . Chronic systolic heart failure ([place]) 11/26/2015  . Hyperlipidemia, mixed 10/17/2015  . Vitamin D deficiency 10/17/2015  . History of angioedema 06/11/2015  . Hematuria 06/11/2015  . Medicare annual wellness visit, subsequent 12/14/2014  . Hyperglycemia 03/24/2012  . Diverticulosis 10/01/2011  . Thrombocytopenia ([place]) 10/02/2010  . Hypothyroidism 01/24/2010  . RHEUMATOID FACTOR, POSITIVE 01/24/2010  . Non Hodgkin's lymphoma ([place]) 01/09/2010  . RAYNAUDS SYNDROME 01/09/2010  . ANXIETY 09/20/2009  . Essential hypertension 09/20/2009  . Nonischemic cardiomyopathy ([place]) 09/20/2009  . Congestive heart failure ([place]) 09/20/2009  . LYMPHOMA, HX OF 09/20/2009    Past Medical History:  Diagnosis Date  . Anxiety   . Cardiomyopathy    non ischemic NL cos on cath 08/2009, EF to 15-20%, Her follow up  2D echo  in 10/2009 showed impoved EFo 35-40%  . CHF (congestive heart failure) ([place])   . History of lymphoma    Dr Martha Clan  . Hypertension   .  Hypothyroid   . Medicare annual wellness visit, subsequent 12/14/2014   Follows with Dr Marin Olp Follows with Dr Deatra Ina, gastroenterology, last colonoscopy in 2012, repeat in 2017 Last Pap 2011, always normal, no need for repeat Last MGM in 2011, no concerns, declines for now Follows with Dr Stanford Breed of cardiology   . Vitamin D deficiency 10/17/2015    Past Surgical History:  Procedure Laterality Date  . DILATION AND CURETTAGE OF UTERUS    . nasal polyps removed     age 80  . TONSILLECTOMY  1952    Social History  Substance Use Topics  . Smoking status: Never Smoker  . Smokeless tobacco: Never Used     Comment: never used tobacco  . Alcohol use No     Family History  Problem Relation Age of Onset  . Diabetes Mother     deceased secondary to diabetes  . Hypertension Mother   . Vision loss Mother   . Pneumonia Father     died age 31 due to complications of pneumonia  . Colon polyps Father   . Liver disease Daughter   . Sarcoidosis Daughter   . Diabetes Sister   . Other      no obvious premature cardiovascular disease or familial cardiomyopathy is noted  . Colon cancer Brother 67  . Cancer Brother   . Obesity Son   . Alcohol abuse Neg Hx   . Diabetes Brother   . Kidney disease Brother   . Diabetes Sister   . Sarcoidosis Sister   . Arthritis Sister   . Arthritis Sister   . Diabetes Sister   . Arthritis Sister   . Diabetes Sister   . Hypertension Daughter   . Hypertension Daughter     Allergies  Allergen Reactions  . Lisinopril Swelling    Facial and lip swelling  . Iohexol      Desc: hives,itiching     Medication list has been reviewed and updated.  Current Outpatient Prescriptions on File Prior to Visit  Medication Sig Dispense Refill  . carvedilol (COREG) 6.25 MG tablet Take 1 tablet (6.25 mg total) by mouth 2 (two) times daily. 180 tablet 3  . furosemide (LASIX) 20 MG tablet Take 1 tablet (20 mg total) by mouth daily. 30 tablet 0  . hydrALAZINE (APRESOLINE) 10 MG tablet Take 1 tablet (10 mg total) by mouth 3 (three) times daily. 90 tablet 12  . isosorbide mononitrate (IMDUR) 30 MG 24 hr tablet Take 0.5 tablets (15 mg total) by mouth daily. 45 tablet 3  . levothyroxine (SYNTHROID, LEVOTHROID) 25 MCG tablet take 1/2 tablet by mouth once daily 30 tablet 5  . Multiple Vitamins-Minerals (CENTRUM PO) Take 1 tablet by mouth daily.     . Omega-3 Fatty Acids (FISH OIL PO) Take 1 tablet by mouth daily.    Marland Kitchen spironolactone (ALDACTONE) 25 MG tablet take 1 tablet by mouth once daily 30 tablet 4   No current facility-administered medications on file prior to visit.     Review of Systems:  As per HPI- otherwise  negative.   Physical Examination: Vitals:   06/22/16 0950 06/22/16 0955  BP: (!) 118/52 (!) 110/50  Pulse: 95 100  Resp: 16   Temp: 98.7 F (37.1 C)    Vitals:   06/22/16 0950  Weight: 133 lb (60.3 kg)   Body mass index is 23.56 kg/m. Ideal Body Weight:    GEN: WDWN, NAD, Non-toxic, A & O x 3, looks very well HEENT: Atraumatic,  Normocephalic. Neck supple. No masses, No LAD. Ears and Nose: No external deformity. CV: RRR, No M/G/R. No JVD. No thrill. No extra heart sounds. PULM: CTA B, no wheezes, crackles, rhonchi. No retractions. No resp. distress. No accessory muscle use. ABD: S, NT, ND, +BS. No rebound. No HSM. EXTR: No c/c/e NEURO Normal gait.  PSYCH: Normally interactive. Conversant. Not depressed or anxious appearing.  Calm demeanor.   BP/ pulse above were sitting and then standing  Creat clearance is approx 74m/min  Results for orders placed or performed in visit on 06/22/16  POCT glucose (manual entry)  Result Value Ref Range   POC Glucose 168 (A) 70 - 99 mg/dl  POCT urinalysis dipstick  Result Value Ref Range   Color, UA yellow yellow   Clarity, UA clear clear   Glucose, UA negative negative   Bilirubin, UA negative negative   Ketones, POC UA negative negative   Spec Grav, UA 1.020    Blood, UA negative negative   pH, UA 6.0    Protein Ur, POC negative negative   Urobilinogen, UA negative    Nitrite, UA Negative Negative   Leukocytes, UA large (3+) (A) Negative    EKG: SR ? New RBBB. No acute ST elevation or depression  Assessment and Plan: Lightheaded - Plan: POCT glucose (manual entry), POCT urinalysis dipstick, EKG 12-Lead  Urine white blood cells increased - Plan: Urine culture, cephALEXin (KEFLEX) 500 MG capsule   Meds ordered this encounter  Medications  . cephALEXin (KEFLEX) 500 MG capsule    Sig: Take 1 capsule (500 mg total) by mouth 2 (two) times daily.    Dispense:  14 capsule    Refill:  0    Here today due to feeling  lightheaded yesterday- feeling better but not 100% well today Her BP is a bit soft, but this is desired with her history of cardiomyopathy She likely has a UTI and this may be the cause of her sx- will treat with keflex while we await her culture Asked her to be sure and seek care if she is not feeling better or if anything changes or gets worse. She did have a CMP today as well that I will look for  Will send note to her primary cardiologist so he may review her EKG  Signed JLamar Blinks MD addnd-  Received her urine culture 8/9- called and did not get an answer but left a detailed message on her cell phone.  She does have an E coli UTI which should respond to keflex.  Also noted her CMP showed mild hyponatremia and an elevation in her creatinine above baseline.  Ok to continue current dose of keflex for any creat clearance over 10  Please let me know if not feeling better.  I will order a BMP- please come in for a lab visit only next week so we can recheck her sodium and renal function    Chemistry      Component Value Date/Time   NA 131 (L) 06/22/2016 0922   K 4.2 06/22/2016 0922   CL 97 06/22/2016 0922   CO2 26 06/22/2016 0922   BUN 30 (H) 06/22/2016 0922   CREATININE 1.79 (H) 06/22/2016 0922   CREATININE 1.01 (H) 01/30/2016 1430      Component Value Date/Time   CALCIUM 9.8 06/22/2016 0922   CALCIUM 9.1 06/22/2016 0922   ALKPHOS 101 06/22/2016 0922   AST 31 06/22/2016 0922   ALT 30 06/22/2016 0922   BILITOT 0.9  06/22/2016 7048       Results for orders placed or performed in visit on 06/22/16  Urine culture  Result Value Ref Range   Culture ESCHERICHIA COLI    Colony Count 10,000-50,000 CFU/mL    Organism ID, Bacteria ESCHERICHIA COLI       Susceptibility   Escherichia coli -  (no method available)    AMPICILLIN 8 Sensitive     AMOX/CLAVULANIC 4 Sensitive     AMPICILLIN/SULBACTAM 4 Sensitive     PIP/TAZO <=4 Sensitive     IMIPENEM <=0.25 Sensitive     CEFAZOLIN  <=4 Not Reportable     CEFTRIAXONE <=1 Sensitive     CEFTAZIDIME <=1 Sensitive     CEFEPIME <=1 Sensitive     GENTAMICIN <=1 Sensitive     TOBRAMYCIN <=1 Sensitive     CIPROFLOXACIN <=0.25 Sensitive     LEVOFLOXACIN <=0.12 Sensitive     NITROFURANTOIN <=16 Sensitive     TRIMETH/SULFA* <=20 Sensitive      * NR=NOT REPORTABLE,SEE COMMENTORAL therapy:A cefazolin MIC of <32 predicts susceptibility to the oral agents cefaclor,cefdinir,cefpodoxime,cefprozil,cefuroxime,cephalexin,and loracarbef when used for therapy of uncomplicated UTIs due to E.coli,K.pneumomiae,and P.mirabilis. PARENTERAL therapy: A cefazolinMIC of >8 indicates resistance to parenteralcefazolin. An alternate test method must beperformed to confirm susceptibility to parenteralcefazolin.  POCT glucose (manual entry)  Result Value Ref Range   POC Glucose 168 (A) 70 - 99 mg/dl  POCT urinalysis dipstick  Result Value Ref Range   Color, UA yellow yellow   Clarity, UA clear clear   Glucose, UA negative negative   Bilirubin, UA negative negative   Ketones, POC UA negative negative   Spec Grav, UA 1.020    Blood, UA negative negative   pH, UA 6.0    Protein Ur, POC negative negative   Urobilinogen, UA negative    Nitrite, UA Negative Negative   Leukocytes, UA large (3+) (A) Negative

## 2016-06-22 NOTE — Patient Instructions (Addendum)
It looks like you may have a urinary tract infection- this may be the cause your symptoms.  I have sent your urine for a culture to find out for sure, but in the meantime we are going to treat you with an antibiotic.  Take the keflex twice daily for one week If you are feeling worse or have any other symptoms please let me know!  We will also check on your labs today and make sure that your electrolytes and kidneys are ok.

## 2016-06-22 NOTE — Progress Notes (Signed)
Pre visit review using our clinic review tool, if applicable. No additional management support is needed unless otherwise documented below in the visit note. 

## 2016-06-23 ENCOUNTER — Telehealth: Payer: Self-pay | Admitting: Family Medicine

## 2016-06-23 LAB — URINE CULTURE

## 2016-06-23 LAB — PTH, INTACT AND CALCIUM
Calcium: 9.1 mg/dL (ref 8.4–10.5)
PTH: 47 pg/mL (ref 14–64)

## 2016-06-24 ENCOUNTER — Encounter: Payer: Self-pay | Admitting: Family Medicine

## 2016-06-24 NOTE — Addendum Note (Signed)
Addended by: Lamar Blinks C on: 06/24/2016 05:32 PM   Modules accepted: Orders

## 2016-06-24 NOTE — Telephone Encounter (Signed)
error 

## 2016-08-09 ENCOUNTER — Other Ambulatory Visit: Payer: Self-pay | Admitting: Family Medicine

## 2016-10-20 ENCOUNTER — Ambulatory Visit (INDEPENDENT_AMBULATORY_CARE_PROVIDER_SITE_OTHER): Payer: Medicare Other | Admitting: Family Medicine

## 2016-10-20 ENCOUNTER — Encounter: Payer: Self-pay | Admitting: Family Medicine

## 2016-10-20 VITALS — BP 104/68 | HR 71 | Temp 97.9°F | Ht 63.0 in | Wt 132.4 lb

## 2016-10-20 DIAGNOSIS — R82998 Other abnormal findings in urine: Secondary | ICD-10-CM

## 2016-10-20 DIAGNOSIS — E782 Mixed hyperlipidemia: Secondary | ICD-10-CM

## 2016-10-20 DIAGNOSIS — E559 Vitamin D deficiency, unspecified: Secondary | ICD-10-CM | POA: Diagnosis not present

## 2016-10-20 DIAGNOSIS — R739 Hyperglycemia, unspecified: Secondary | ICD-10-CM | POA: Diagnosis not present

## 2016-10-20 DIAGNOSIS — R8299 Other abnormal findings in urine: Secondary | ICD-10-CM

## 2016-10-20 DIAGNOSIS — I10 Essential (primary) hypertension: Secondary | ICD-10-CM

## 2016-10-20 DIAGNOSIS — E039 Hypothyroidism, unspecified: Secondary | ICD-10-CM

## 2016-10-20 LAB — LIPID PANEL
CHOLESTEROL: 208 mg/dL — AB (ref 0–200)
HDL: 55 mg/dL (ref >39.00)
LDL CALC: 126 mg/dL — AB (ref 0–99)
NonHDL: 152.53
TRIGLYCERIDES: 131 mg/dL (ref 0.0–149.0)
Total CHOL/HDL Ratio: 4
VLDL: 26.2 mg/dL (ref 0.0–40.0)

## 2016-10-20 LAB — COMPREHENSIVE METABOLIC PANEL
ALBUMIN: 3.9 g/dL (ref 3.5–5.2)
ALT: 24 U/L (ref 0–35)
AST: 25 U/L (ref 0–37)
Alkaline Phosphatase: 99 U/L (ref 39–117)
BUN: 26 mg/dL — ABNORMAL HIGH (ref 6–23)
CALCIUM: 10.3 mg/dL (ref 8.4–10.5)
CHLORIDE: 100 meq/L (ref 96–112)
CO2: 30 meq/L (ref 19–32)
Creatinine, Ser: 1.52 mg/dL — ABNORMAL HIGH (ref 0.40–1.20)
GFR: 42.23 mL/min — AB (ref >60.00)
Glucose, Bld: 107 mg/dL — ABNORMAL HIGH (ref 70–99)
POTASSIUM: 4.8 meq/L (ref 3.5–5.1)
Sodium: 136 mEq/L (ref 135–145)
Total Bilirubin: 0.6 mg/dL (ref 0.2–1.2)
Total Protein: 8.8 g/dL — ABNORMAL HIGH (ref 6.0–8.3)

## 2016-10-20 LAB — VITAMIN D 25 HYDROXY (VIT D DEFICIENCY, FRACTURES): VITD: 48.2 ng/mL (ref 30.00–100.00)

## 2016-10-20 LAB — URINALYSIS, ROUTINE W REFLEX MICROSCOPIC
BILIRUBIN URINE: NEGATIVE
Hgb urine dipstick: NEGATIVE
KETONES UR: NEGATIVE
NITRITE: NEGATIVE
RBC / HPF: NONE SEEN (ref 0–?)
Total Protein, Urine: NEGATIVE
URINE GLUCOSE: NEGATIVE
UROBILINOGEN UA: 0.2 (ref 0.0–1.0)
pH: 7 (ref 5.0–8.0)

## 2016-10-20 LAB — CBC
HEMATOCRIT: 36 % (ref 36.0–46.0)
HEMOGLOBIN: 12 g/dL (ref 12.0–15.0)
MCHC: 33.4 g/dL (ref 30.0–36.0)
MCV: 87.4 fl (ref 78.0–100.0)
PLATELETS: 138 10*3/uL — AB (ref 150.0–400.0)
RBC: 4.11 Mil/uL (ref 3.87–5.11)
RDW: 13.2 % (ref 11.5–15.5)
WBC: 3.6 10*3/uL — ABNORMAL LOW (ref 4.0–10.5)

## 2016-10-20 LAB — HEMOGLOBIN A1C: HEMOGLOBIN A1C: 6.1 % (ref 4.6–6.5)

## 2016-10-20 LAB — TSH: TSH: 3.35 u[IU]/mL (ref 0.35–4.50)

## 2016-10-20 NOTE — Progress Notes (Signed)
Patient ID: Crystal [name], female   DOB: [birth year], 82 y.o.   MRN: [id number]   Subjective:    Patient ID: Crystal [name], female    DOB: August 27, 1934, 82 y.o.   MRN: [id number]  Chief Complaint  Patient presents with  . Follow-up    HPI Patient is in today for follow up. She continue to do well. She is staying active with the [employer] and regular exercise. She maintains a heart healthy diet. She continues to follow with oncology and is doing well. No recent illness or acute concerns. Denies CP/palp/SOB/HA/congestion/fevers/GI or GU c/o. Taking meds as prescribed  Past Medical History:  Diagnosis Date  . Anxiety   . Cardiomyopathy    non ischemic NL cos on cath 08/2009, EF to 15-20%, Her follow up  2D echo  in 10/2009 showed impoved EFo 35-40%  . CHF (congestive heart failure) ([place])   . History of lymphoma    Dr Martha Clan  . Hypertension   . Hypothyroid   . Medicare annual wellness visit, subsequent 12/14/2014   Follows with Dr Marin Olp Follows with Dr Deatra Ina, gastroenterology, last colonoscopy in 2012, repeat in 2017 Last Pap 2011, always normal, no need for repeat Last MGM in 2011, no concerns, declines for now Follows with Dr Stanford Breed of cardiology   . Vitamin D deficiency 10/17/2015    Past Surgical History:  Procedure Laterality Date  . DILATION AND CURETTAGE OF UTERUS    . nasal polyps removed     age 72  . TONSILLECTOMY  1952    Family History  Problem Relation Age of Onset  . Diabetes Mother     deceased secondary to diabetes  . Hypertension Mother   . Vision loss Mother   . Pneumonia Father     died age 64 due to complications of pneumonia  . Colon polyps Father   . Liver disease Daughter   . Sarcoidosis Daughter   . Diabetes Sister   . Other      no obvious premature cardiovascular disease or familial cardiomyopathy is noted  . Colon cancer Brother 60  . Cancer Brother   . Obesity Son   . Alcohol abuse Neg Hx   . Diabetes Brother   . Kidney disease  Brother   . Diabetes Sister   . Sarcoidosis Sister   . Arthritis Sister   . Arthritis Sister   . Diabetes Sister   . Arthritis Sister   . Diabetes Sister   . Hypertension Daughter   . Hypertension Daughter     Social History   Social History  . Marital status: Married    Spouse name: N/A  . Number of children: N/A  . Years of education: N/A   Occupational History  . Not on file.   Social History Main Topics  . Smoking status: Never Smoker  . Smokeless tobacco: Never Used     Comment: never used tobacco  . Alcohol use No  . Drug use: No  . Sexual activity: Not on file     Comment: lives with husband, no dietary restrictions, minimizes dairy   Other Topics Concern  . Not on file   Social History Narrative   The patient is married ( husband Joylynn Defrancesco)  She just recently celebrated     her 52nd anniversary.  She has five children.  There is no active     tobacco or alcohol use history.          Smoking Status:  never   Caffeine use/day:  None   Does Patient Exercise:  yes    Outpatient Medications Prior to Visit  Medication Sig Dispense Refill  . carvedilol (COREG) 6.25 MG tablet Take 1 tablet (6.25 mg total) by mouth 2 (two) times daily. 180 tablet 3  . furosemide (LASIX) 20 MG tablet Take 1 tablet (20 mg total) by mouth daily. 30 tablet 0  . hydrALAZINE (APRESOLINE) 10 MG tablet Take 1 tablet (10 mg total) by mouth 3 (three) times daily. 90 tablet 12  . isosorbide mononitrate (IMDUR) 30 MG 24 hr tablet Take 0.5 tablets (15 mg total) by mouth daily. 45 tablet 3  . levothyroxine (SYNTHROID, LEVOTHROID) 25 MCG tablet take 1/2 tablet by mouth once daily 30 tablet 5  . Multiple Vitamins-Minerals (CENTRUM PO) Take 1 tablet by mouth daily.     . Omega-3 Fatty Acids (FISH OIL PO) Take 1 tablet by mouth daily.    Marland Kitchen spironolactone (ALDACTONE) 25 MG tablet take 1 tablet by mouth once daily 30 tablet 4  . cephALEXin (KEFLEX) 500 MG capsule Take 1 capsule (500 mg total) by  mouth 2 (two) times daily. 14 capsule 0   No facility-administered medications prior to visit.     Allergies  Allergen Reactions  . Lisinopril Swelling    Facial and lip swelling  . Iohexol      Desc: hives,itiching     Review of Systems  Constitutional: Negative for fever and malaise/fatigue.  HENT: Negative for congestion.   Eyes: Negative for blurred vision.  Respiratory: Negative for shortness of breath.   Cardiovascular: Negative for chest pain, palpitations and leg swelling.  Gastrointestinal: Negative for abdominal pain, blood in stool and nausea.  Genitourinary: Negative for dysuria and frequency.  Musculoskeletal: Negative for falls.  Skin: Negative for rash.  Neurological: Negative for dizziness, loss of consciousness and headaches.  Endo/Heme/Allergies: Negative for environmental allergies.  Psychiatric/Behavioral: Negative for depression. The patient is not nervous/anxious.        Objective:    Physical Exam  Constitutional: She is oriented to person, place, and time. She appears well-developed and well-nourished. No distress.  HENT:  Head: Normocephalic and atraumatic.  Nose: Nose normal.  Eyes: Right eye exhibits no discharge. Left eye exhibits no discharge.  Neck: Normal range of motion. Neck supple.  Cardiovascular: Normal rate and regular rhythm.   Pulmonary/Chest: Effort normal and breath sounds normal.  Abdominal: Soft. Bowel sounds are normal. There is no tenderness.  Musculoskeletal: She exhibits no edema.  Neurological: She is alert and oriented to person, place, and time.  Skin: Skin is warm and dry.  Psychiatric: She has a normal mood and affect.  Nursing note and vitals reviewed.   BP 104/68 (BP Location: Left Arm, Patient Position: Sitting, Cuff Size: Normal)   Pulse 71   Temp 97.9 F (36.6 C) (Oral)   Ht '5\' 3"'$  (1.6 m)   Wt 132 lb 6 oz (60 kg)   SpO2 94%   BMI 23.45 kg/m  Wt Readings from Last 3 Encounters:  10/20/16 132 lb 6 oz (60  kg)  06/22/16 133 lb (60.3 kg)  05/12/16 133 lb (60.3 kg)     Lab Results  Component Value Date   WBC 4.5 04/17/2016   HGB 12.9 04/17/2016   HCT 39.1 04/17/2016   PLT 140.0 (L) 04/17/2016   GLUCOSE 168 (H) 06/22/2016   CHOL 206 (H) 04/17/2016   TRIG 116.0 04/17/2016   HDL 58.70 04/17/2016   LDLCALC 124 (  H) 04/17/2016   ALT 30 06/22/2016   AST 31 06/22/2016   NA 131 (L) 06/22/2016   K 4.2 06/22/2016   CL 97 06/22/2016   CREATININE 1.79 (H) 06/22/2016   BUN 30 (H) 06/22/2016   CO2 26 06/22/2016   TSH 3.60 04/17/2016   INR 1.08 09/05/2009   HGBA1C 5.7 10/17/2015    Lab Results  Component Value Date   TSH 3.60 04/17/2016   Lab Results  Component Value Date   WBC 4.5 04/17/2016   HGB 12.9 04/17/2016   HCT 39.1 04/17/2016   MCV 89.9 04/17/2016   PLT 140.0 (L) 04/17/2016   Lab Results  Component Value Date   NA 131 (L) 06/22/2016   K 4.2 06/22/2016   CO2 26 06/22/2016   GLUCOSE 168 (H) 06/22/2016   BUN 30 (H) 06/22/2016   CREATININE 1.79 (H) 06/22/2016   BILITOT 0.9 06/22/2016   ALKPHOS 101 06/22/2016   AST 31 06/22/2016   ALT 30 06/22/2016   PROT 8.6 (H) 06/22/2016   ALBUMIN 3.5 06/22/2016   CALCIUM 9.8 06/22/2016   CALCIUM 9.1 06/22/2016   ANIONGAP 8 01/20/2016   GFR 35.00 (L) 06/22/2016   Lab Results  Component Value Date   CHOL 206 (H) 04/17/2016   Lab Results  Component Value Date   HDL 58.70 04/17/2016   Lab Results  Component Value Date   LDLCALC 124 (H) 04/17/2016   Lab Results  Component Value Date   TRIG 116.0 04/17/2016   Lab Results  Component Value Date   CHOLHDL 4 04/17/2016   Lab Results  Component Value Date   HGBA1C 5.7 10/17/2015       Assessment & Plan:   Problem List Items Addressed This Visit    Hypothyroidism    On Levothyroxine, continue to monitor      Relevant Orders   TSH   Essential hypertension    Well controlled, no changes to meds. Encouraged heart healthy diet such as the DASH diet and exercise as  tolerated.       Relevant Orders   CBC   Comprehensive metabolic panel   Hyperglycemia    minimize simple carbs. Increase exercise as tolerated.       Relevant Orders   Hemoglobin A1c   Hyperlipidemia, mixed    Encouraged heart healthy diet, increase exercise, avoid trans fats, consider a krill oil cap daily      Relevant Orders   Lipid panel   Vitamin D deficiency    Check a level today, encouraged vitamin d daily      Relevant Orders   VITAMIN D 25 Hydroxy (Vit-D Deficiency, Fractures)    Other Visit Diagnoses    Urine WBC increased    -  Primary   Relevant Orders   Urinalysis      I have discontinued Ms. Regula's cephALEXin. I am also having her maintain her Multiple Vitamins-Minerals (CENTRUM PO), Omega-3 Fatty Acids (FISH OIL PO), isosorbide mononitrate, furosemide, hydrALAZINE, levothyroxine, carvedilol, and spironolactone.  No orders of the defined types were placed in this encounter.    Penni Homans, MD

## 2016-10-20 NOTE — Progress Notes (Signed)
Pre visit review using our clinic review tool, if applicable. No additional management support is needed unless otherwise documented below in the visit note. 

## 2016-10-20 NOTE — Assessment & Plan Note (Signed)
Encouraged heart healthy diet, increase exercise, avoid trans fats, consider a krill oil cap daily 

## 2016-10-20 NOTE — Assessment & Plan Note (Signed)
Check a level today, encouraged vitamin d daily

## 2016-10-20 NOTE — Patient Instructions (Signed)
Start Vitamin D supplement 1000 to 2000 IU daily   Hypertension Hypertension, commonly called high blood pressure, is when the force of blood pumping through your arteries is too strong. Your arteries are the blood vessels that carry blood from your heart throughout your body. A blood pressure reading consists of a higher number over a lower number, such as 110/72. The higher number (systolic) is the pressure inside your arteries when your heart pumps. The lower number (diastolic) is the pressure inside your arteries when your heart relaxes. Ideally you want your blood pressure below 120/80. Hypertension forces your heart to work harder to pump blood. Your arteries may become narrow or stiff. Having untreated or uncontrolled hypertension can cause heart attack, stroke, kidney disease, and other problems. What increases the risk? Some risk factors for high blood pressure are controllable. Others are not. Risk factors you cannot control include:  Race. You may be at higher risk if you are African American.  Age. Risk increases with age.  Gender. Men are at higher risk than women before age 59 years. After age 63, women are at higher risk than men. Risk factors you can control include:  Not getting enough exercise or physical activity.  Being overweight.  Getting too much fat, sugar, calories, or salt in your diet.  Drinking too much alcohol. What are the signs or symptoms? Hypertension does not usually cause signs or symptoms. Extremely high blood pressure (hypertensive crisis) may cause headache, anxiety, shortness of breath, and nosebleed. How is this diagnosed? To check if you have hypertension, your health care provider will measure your blood pressure while you are seated, with your arm held at the level of your heart. It should be measured at least twice using the same arm. Certain conditions can cause a difference in blood pressure between your right and left arms. A blood pressure  reading that is higher than normal on one occasion does not mean that you need treatment. If it is not clear whether you have high blood pressure, you may be asked to return on a different day to have your blood pressure checked again. Or, you may be asked to monitor your blood pressure at home for 1 or more weeks. How is this treated? Treating high blood pressure includes making lifestyle changes and possibly taking medicine. Living a healthy lifestyle can help lower high blood pressure. You may need to change some of your habits. Lifestyle changes may include:  Following the DASH diet. This diet is high in fruits, vegetables, and whole grains. It is low in salt, red meat, and added sugars.  Keep your sodium intake below 2,300 mg per day.  Getting at least 30-45 minutes of aerobic exercise at least 4 times per week.  Losing weight if necessary.  Not smoking.  Limiting alcoholic beverages.  Learning ways to reduce stress. Your health care provider may prescribe medicine if lifestyle changes are not enough to get your blood pressure under control, and if one of the following is true:  You are 81-38 years of age and your systolic blood pressure is above 140.  You are 12 years of age or older, and your systolic blood pressure is above 150.  Your diastolic blood pressure is above 90.  You have diabetes, and your systolic blood pressure is over 962 or your diastolic blood pressure is over 90.  You have kidney disease and your blood pressure is above 140/90.  You have heart disease and your blood pressure is above 140/90.  Your personal target blood pressure may vary depending on your medical conditions, your age, and other factors. Follow these instructions at home:  Have your blood pressure rechecked as directed by your health care provider.  Take medicines only as directed by your health care provider. Follow the directions carefully. Blood pressure medicines must be taken as  prescribed. The medicine does not work as well when you skip doses. Skipping doses also puts you at risk for problems.  Do not smoke.  Monitor your blood pressure at home as directed by your health care provider. Contact a health care provider if:  You think you are having a reaction to medicines taken.  You have recurrent headaches or feel dizzy.  You have swelling in your ankles.  You have trouble with your vision. Get help right away if:  You develop a severe headache or confusion.  You have unusual weakness, numbness, or feel faint.  You have severe chest or abdominal pain.  You vomit repeatedly.  You have trouble breathing. This information is not intended to replace advice given to you by your health care provider. Make sure you discuss any questions you have with your health care provider. Document Released: 11/02/2005 Document Revised: 04/09/2016 Document Reviewed: 08/25/2013 Elsevier Interactive Patient Education  2017 [employer].

## 2016-10-20 NOTE — Assessment & Plan Note (Signed)
Well controlled, no changes to meds. Encouraged heart healthy diet such as the DASH diet and exercise as tolerated.  °

## 2016-10-20 NOTE — Assessment & Plan Note (Signed)
minimize simple carbs. Increase exercise as tolerated.  

## 2016-10-20 NOTE — Assessment & Plan Note (Signed)
On Levothyroxine, continue to monitor 

## 2016-10-21 ENCOUNTER — Other Ambulatory Visit: Payer: Medicare Other

## 2016-10-21 DIAGNOSIS — R82998 Other abnormal findings in urine: Secondary | ICD-10-CM

## 2016-10-21 DIAGNOSIS — R8299 Other abnormal findings in urine: Secondary | ICD-10-CM | POA: Diagnosis not present

## 2016-10-23 ENCOUNTER — Other Ambulatory Visit: Payer: Self-pay | Admitting: Family Medicine

## 2016-10-23 LAB — URINE CULTURE

## 2016-10-23 MED ORDER — CEFDINIR 300 MG PO CAPS
300.0000 mg | ORAL_CAPSULE | Freq: Two times a day (BID) | ORAL | 0 refills | Status: DC
Start: 2016-10-23 — End: 2017-04-20

## 2016-11-07 ENCOUNTER — Other Ambulatory Visit: Payer: Self-pay | Admitting: Cardiovascular Disease

## 2016-11-07 ENCOUNTER — Other Ambulatory Visit: Payer: Self-pay | Admitting: Physician Assistant

## 2016-11-10 NOTE — Telephone Encounter (Signed)
Review for refill. 

## 2016-11-10 NOTE — Telephone Encounter (Signed)
REFILL 

## 2016-11-11 ENCOUNTER — Other Ambulatory Visit: Payer: Self-pay | Admitting: Physician Assistant

## 2016-11-11 NOTE — Telephone Encounter (Signed)
isosorbide mononitrate (IMDUR) 30 MG 24 hr tablet  Medication  Date: 11/10/2016 Department: [facility] [place] Office Ordering/Authorizing: Eileen Stanford, PA-C  Order Providers   Prescribing Provider Encounter Provider  Eileen Stanford, PA-C Eileen Stanford, PA-C  Medication Detail    Disp Refills Start End   isosorbide mononitrate (IMDUR) 30 MG 24 hr tablet 45 tablet 1 11/10/2016    Sig: take 1/2 tablet by mouth once daily   E-Prescribing Status: Receipt confirmed by pharmacy (11/10/2016 10:28 AM EST)   Pharmacy   [facility], [place]

## 2017-01-13 ENCOUNTER — Other Ambulatory Visit: Payer: Self-pay | Admitting: Family Medicine

## 2017-01-18 ENCOUNTER — Other Ambulatory Visit: Payer: Self-pay | Admitting: Family Medicine

## 2017-01-21 ENCOUNTER — Ambulatory Visit (HOSPITAL_BASED_OUTPATIENT_CLINIC_OR_DEPARTMENT_OTHER): Payer: Medicare Other | Admitting: Hematology & Oncology

## 2017-01-21 ENCOUNTER — Other Ambulatory Visit (HOSPITAL_BASED_OUTPATIENT_CLINIC_OR_DEPARTMENT_OTHER): Payer: Medicare Other

## 2017-01-21 VITALS — BP 123/70 | HR 72 | Temp 97.8°F | Resp 17 | Wt 135.0 lb

## 2017-01-21 DIAGNOSIS — C8331 Diffuse large B-cell lymphoma, lymph nodes of head, face, and neck: Secondary | ICD-10-CM | POA: Insufficient documentation

## 2017-01-21 DIAGNOSIS — D72818 Other decreased white blood cell count: Secondary | ICD-10-CM

## 2017-01-21 DIAGNOSIS — Z8572 Personal history of non-Hodgkin lymphomas: Secondary | ICD-10-CM | POA: Diagnosis not present

## 2017-01-21 DIAGNOSIS — C8330 Diffuse large B-cell lymphoma, unspecified site: Secondary | ICD-10-CM

## 2017-01-21 DIAGNOSIS — C8205 Follicular lymphoma grade I, lymph nodes of inguinal region and lower limb: Secondary | ICD-10-CM

## 2017-01-21 LAB — COMPREHENSIVE METABOLIC PANEL
ALBUMIN: 3.7 g/dL (ref 3.5–5.0)
ALK PHOS: 124 U/L (ref 40–150)
ALT: 25 U/L (ref 0–55)
AST: 27 U/L (ref 5–34)
Anion Gap: 8 mEq/L (ref 3–11)
BUN: 30 mg/dL — AB (ref 7.0–26.0)
CO2: 30 mEq/L — ABNORMAL HIGH (ref 22–29)
CREATININE: 1.8 mg/dL — AB (ref 0.6–1.1)
Calcium: 11.1 mg/dL — ABNORMAL HIGH (ref 8.4–10.4)
Chloride: 101 mEq/L (ref 98–109)
EGFR: 29 mL/min/{1.73_m2} — ABNORMAL LOW (ref >90)
GLUCOSE: 75 mg/dL (ref 70–140)
Potassium: 5.1 mEq/L (ref 3.5–5.1)
SODIUM: 138 meq/L (ref 136–145)
TOTAL PROTEIN: 8.9 g/dL — AB (ref 6.4–8.3)
Total Bilirubin: 0.5 mg/dL (ref 0.20–1.20)

## 2017-01-21 LAB — CBC WITH DIFFERENTIAL (CANCER CENTER ONLY)
BASO#: 0 10*3/uL (ref 0.0–0.2)
BASO%: 0.5 % (ref 0.0–2.0)
EOS%: 1.8 % (ref 0.0–7.0)
Eosinophils Absolute: 0.1 10*3/uL (ref 0.0–0.5)
HEMATOCRIT: 36.2 % (ref 34.8–46.6)
HEMOGLOBIN: 11.9 g/dL (ref 11.6–15.9)
LYMPH#: 1.3 10*3/uL (ref 0.9–3.3)
LYMPH%: 34.3 % (ref 14.0–48.0)
MCH: 30.1 pg (ref 26.0–34.0)
MCHC: 32.9 g/dL (ref 32.0–36.0)
MCV: 91 fL (ref 81–101)
MONO#: 0.6 10*3/uL (ref 0.1–0.9)
MONO%: 16.5 % — AB (ref 0.0–13.0)
NEUT%: 46.9 % (ref 39.6–80.0)
NEUTROS ABS: 1.8 10*3/uL (ref 1.5–6.5)
Platelets: 127 10*3/uL — ABNORMAL LOW (ref 145–400)
RBC: 3.96 10*6/uL (ref 3.70–5.32)
RDW: 11.9 % (ref 11.1–15.7)
WBC: 3.8 10*3/uL — AB (ref 3.9–10.0)

## 2017-01-21 NOTE — Progress Notes (Signed)
Hematology and Oncology Follow Up Visit  Crystal [name] [id number] 08-16-1934 83 y.o. 01/21/2017   Principle Diagnosis:  Diffuse large cell non-Hodgkin's lymphoma-remission  Current Therapy:   Observation    Interim History:  Crystal [name] is here today for a follow-up. She is doing quite well. As always, she is involved with the [employer]. She usually [place] 8 or 9 events and women's most of these.   She is looking forward to resurrection Sunday. Her husband is a Theme park manager. They will be up in [place] where he has a Therapist, sports.   She's had no issues with her heart. She's had no syncopal episodes. She's had no episodes of shortness of breath. She's had no congestive heart failure issues.  Her appetite is good and she is making sure to stay hydrated. Her weight is stable.   She's had no fever. She's had no influenza. She's had no problems with bowels or bladder.  Overall, her performance status is ECOG 1.  Medications:  Allergies as of 01/21/2017      Reactions   Lisinopril Swelling   Facial and lip swelling   Iohexol     Desc: hives,itiching      Medication List       Accurate as of 01/21/17 11:11 AM. Always use your most recent med list.          carvedilol 6.25 MG tablet Commonly known as:  COREG Take 1 tablet (6.25 mg total) by mouth 2 (two) times daily.   cefdinir 300 MG capsule Commonly known as:  OMNICEF Take 1 capsule (300 mg total) by mouth 2 (two) times daily. Take 5 days   CENTRUM PO Take 1 tablet by mouth daily.   FISH OIL PO Take 1 tablet by mouth daily.   furosemide 20 MG tablet Commonly known as:  LASIX take 1 tablet by mouth once daily   hydrALAZINE 10 MG tablet Commonly known as:  APRESOLINE Take 1 tablet (10 mg total) by mouth 3 (three) times daily.   isosorbide mononitrate 30 MG 24 hr tablet Commonly known as:  IMDUR take 1/2 tablet by mouth once daily   levothyroxine 25 MCG tablet Commonly known as:  SYNTHROID, LEVOTHROID take 1/2 tablet  by mouth once daily   PROBIOTIC DAILY PO Take by mouth.   spironolactone 25 MG tablet Commonly known as:  ALDACTONE take 1 tablet by mouth daily       Allergies:  Allergies  Allergen Reactions  . Lisinopril Swelling    Facial and lip swelling  . Iohexol      Desc: hives,itiching     Past Medical History, Surgical history, Social history, and Family History were reviewed and updated.  Review of Systems: All other 10 point review of systems is negative.   Physical Exam:  weight is 135 lb (61.2 kg). Her oral temperature is 97.8 F (36.6 C). Her blood pressure is 123/70 and her pulse is 72. Her respiration is 17 and oxygen saturation is 100%.   Wt Readings from Last 3 Encounters:  01/21/17 135 lb (61.2 kg)  10/20/16 132 lb 6 oz (60 kg)  06/22/16 133 lb (60.3 kg)    Elderly African-American female in no obvious distress. Head and neck exam shows no ocular or oral lesions. There is no adenopathy in her neck. Lungs are clear. She has no rales, wheezes or rhonchi. Cardiac exam regular rate and rhythm. She has an occasional extra beat. She has 1/6 systolic ejection murmur. Abdomen is soft. She has  good bowel sounds. There is no fluid wave. There is no palpable liver or spleen tip. Back exam shows no tenderness over the spine, ribs or hips. Extremities shows no clubbing, cyanosis or edema. She has good range of motion of her joints. She has good strength in her joints. Skin exam shows no rashes, ecchymoses or petechia. Back exam shows no kyphosis. Neurological exam shows no focal neurological deficits.   Lab Results  Component Value Date   WBC 3.8 (L) 01/21/2017   HGB 11.9 01/21/2017   HCT 36.2 01/21/2017   MCV 91 01/21/2017   PLT 127 (L) 01/21/2017   No results found for: FERRITIN, IRON, TIBC, UIBC, IRONPCTSAT Lab Results  Component Value Date   RETICCTPCT 1.1 10/15/2011   RBC 3.96 01/21/2017   RETICCTABS 49.7 10/15/2011   No results found for: KPAFRELGTCHN, LAMBDASER,  KAPLAMBRATIO No results found for: IGGSERUM, IGA, IGMSERUM No results found for: Odetta Pink, SPEI   Chemistry      Component Value Date/Time   NA 136 10/20/2016 1119   K 4.8 10/20/2016 1119   CL 100 10/20/2016 1119   CO2 30 10/20/2016 1119   BUN 26 (H) 10/20/2016 1119   CREATININE 1.52 (H) 10/20/2016 1119   CREATININE 1.01 (H) 01/30/2016 1430      Component Value Date/Time   CALCIUM 10.3 10/20/2016 1119   ALKPHOS 99 10/20/2016 1119   AST 25 10/20/2016 1119   ALT 24 10/20/2016 1119   BILITOT 0.6 10/20/2016 1119     Impression and Plan: Crystal [name] is a very pleasant 83 year old African American female with a history of diffuse large cell non-Hodgkin lymphoma. She is now 17 years out from therapy and so far there has been no evidence of recurrence. She is asymptomatic at this time.   I think her biggest problem will be cardiac. Thankfully, she is not had any issues with this. She does see cardiology.  I do not see need for any type of scan needs to be done right now.  She has chronic leukopenia. I suspect this probably is ethnic associated leukopenia. This should not cause any increased risk for infections.  As always, we will see her back in one year. She likes come back to see [place] and have fellowship.  Volanda Napoleon, MD 3/8/201811:11 AM

## 2017-02-25 ENCOUNTER — Other Ambulatory Visit: Payer: Self-pay | Admitting: Cardiology

## 2017-02-25 DIAGNOSIS — I428 Other cardiomyopathies: Secondary | ICD-10-CM

## 2017-04-02 ENCOUNTER — Telehealth: Payer: Self-pay | Admitting: Family Medicine

## 2017-04-02 ENCOUNTER — Other Ambulatory Visit: Payer: Self-pay | Admitting: Cardiology

## 2017-04-02 DIAGNOSIS — I428 Other cardiomyopathies: Secondary | ICD-10-CM

## 2017-04-02 MED ORDER — HYDRALAZINE HCL 10 MG PO TABS: 10.0000 mg | ORAL_TABLET | Freq: Three times a day (TID) | ORAL | 0 refills | Status: DC

## 2017-04-02 NOTE — Telephone Encounter (Signed)
Looks like Dr. Stanford Breed fills this---advise on this refill

## 2017-04-02 NOTE — Telephone Encounter (Signed)
New message     *STAT* If patient is at the pharmacy, call can be transferred to refill team.   1. Which medications need to be refilled? (please list name of each medication and dose if known) hydralazine 10 mg   2. Which pharmacy/location (including street and city if local pharmacy) is medication to be sent to? [facility]  3. Do they need a 30 day or 90 day supply? 30 day

## 2017-04-02 NOTE — Telephone Encounter (Signed)
°  Relation to WU:XLKG Call back number:[phone] Pharmacy: [facility]   Reason for call:  Patient requesting a refill hydrALAZINE (APRESOLINE) 10 MG tablet

## 2017-04-02 NOTE — Telephone Encounter (Signed)
Sent in and patient informed Of pcp information

## 2017-04-02 NOTE — Telephone Encounter (Signed)
OK to give her a 30 day supply of Hydralzine then she has to schedule an appt with Dr Stanford Breed because she is over due to be seen there.

## 2017-04-14 ENCOUNTER — Other Ambulatory Visit: Payer: Self-pay | Admitting: Family Medicine

## 2017-04-20 ENCOUNTER — Other Ambulatory Visit: Payer: Self-pay | Admitting: Family Medicine

## 2017-04-20 ENCOUNTER — Ambulatory Visit (INDEPENDENT_AMBULATORY_CARE_PROVIDER_SITE_OTHER): Payer: Medicare Other | Admitting: Family Medicine

## 2017-04-20 ENCOUNTER — Encounter: Payer: Self-pay | Admitting: Family Medicine

## 2017-04-20 VITALS — BP 122/78 | HR 60 | Temp 97.9°F | Resp 18 | Wt 134.8 lb

## 2017-04-20 DIAGNOSIS — I10 Essential (primary) hypertension: Secondary | ICD-10-CM | POA: Diagnosis not present

## 2017-04-20 DIAGNOSIS — R739 Hyperglycemia, unspecified: Secondary | ICD-10-CM

## 2017-04-20 DIAGNOSIS — I428 Other cardiomyopathies: Secondary | ICD-10-CM

## 2017-04-20 DIAGNOSIS — Z0000 Encounter for general adult medical examination without abnormal findings: Secondary | ICD-10-CM

## 2017-04-20 DIAGNOSIS — E039 Hypothyroidism, unspecified: Secondary | ICD-10-CM

## 2017-04-20 DIAGNOSIS — E782 Mixed hyperlipidemia: Secondary | ICD-10-CM

## 2017-04-20 HISTORY — DX: Hypercalcemia: E83.52

## 2017-04-20 LAB — COMPREHENSIVE METABOLIC PANEL
ALK PHOS: 103 U/L (ref 39–117)
ALT: 32 U/L (ref 0–35)
AST: 33 U/L (ref 0–37)
Albumin: 4.1 g/dL (ref 3.5–5.2)
BUN: 24 mg/dL — ABNORMAL HIGH (ref 6–23)
CHLORIDE: 102 meq/L (ref 96–112)
CO2: 31 meq/L (ref 19–32)
Calcium: 10.6 mg/dL — ABNORMAL HIGH (ref 8.4–10.5)
Creatinine, Ser: 1.47 mg/dL — ABNORMAL HIGH (ref 0.40–1.20)
GFR: 43.84 mL/min — AB (ref >60.00)
GLUCOSE: 110 mg/dL — AB (ref 70–99)
POTASSIUM: 4.7 meq/L (ref 3.5–5.1)
Sodium: 136 mEq/L (ref 135–145)
Total Bilirubin: 0.6 mg/dL (ref 0.2–1.2)
Total Protein: 9 g/dL — ABNORMAL HIGH (ref 6.0–8.3)

## 2017-04-20 LAB — HEMOGLOBIN A1C: Hgb A1c MFr Bld: 6 % (ref 4.6–6.5)

## 2017-04-20 LAB — CBC WITH DIFFERENTIAL/PLATELET
Basophils Absolute: 0 10*3/uL (ref 0.0–0.1)
Basophils Relative: 0.8 % (ref 0.0–3.0)
Eosinophils Absolute: 0.1 10*3/uL (ref 0.0–0.7)
Eosinophils Relative: 4.2 % (ref 0.0–5.0)
HCT: 37.2 % (ref 36.0–46.0)
Hemoglobin: 12.3 g/dL (ref 12.0–15.0)
LYMPHS ABS: 1.3 10*3/uL (ref 0.7–4.0)
Lymphocytes Relative: 39.2 % (ref 12.0–46.0)
MCHC: 33.1 g/dL (ref 30.0–36.0)
MCV: 89.7 fl (ref 78.0–100.0)
MONOS PCT: 15 % — AB (ref 3.0–12.0)
Monocytes Absolute: 0.5 10*3/uL (ref 0.1–1.0)
NEUTROS ABS: 1.4 10*3/uL (ref 1.4–7.7)
NEUTROS PCT: 40.8 % — AB (ref 43.0–77.0)
PLATELETS: 158 10*3/uL (ref 150.0–400.0)
RBC: 4.15 Mil/uL (ref 3.87–5.11)
RDW: 12.8 % (ref 11.5–15.5)
WBC: 3.4 10*3/uL — ABNORMAL LOW (ref 4.0–10.5)

## 2017-04-20 LAB — LIPID PANEL
Cholesterol: 201 mg/dL — ABNORMAL HIGH (ref 0–200)
HDL: 55.5 mg/dL (ref >39.00)
LDL Cholesterol: 121 mg/dL — ABNORMAL HIGH (ref 0–99)
NONHDL: 145.91
Total CHOL/HDL Ratio: 4
Triglycerides: 127 mg/dL (ref 0.0–149.0)
VLDL: 25.4 mg/dL (ref 0.0–40.0)

## 2017-04-20 LAB — TSH: TSH: 7.01 u[IU]/mL — ABNORMAL HIGH (ref 0.35–4.50)

## 2017-04-20 LAB — VITAMIN D 25 HYDROXY (VIT D DEFICIENCY, FRACTURES): VITD: 59.06 ng/mL (ref 30.00–100.00)

## 2017-04-20 MED ORDER — SPIRONOLACTONE 25 MG PO TABS: 25.0000 mg | ORAL_TABLET | Freq: Every day | ORAL | 4 refills | Status: DC

## 2017-04-20 MED ORDER — HYDRALAZINE HCL 10 MG PO TABS: 10.0000 mg | ORAL_TABLET | Freq: Three times a day (TID) | ORAL | 0 refills | Status: DC

## 2017-04-20 MED ORDER — ISOSORBIDE MONONITRATE ER 30 MG PO TB24: 15.0000 mg | ORAL_TABLET | Freq: Every day | ORAL | 1 refills | Status: DC

## 2017-04-20 MED ORDER — LEVOTHYROXINE SODIUM 25 MCG PO TABS: 12.5000 ug | ORAL_TABLET | Freq: Every day | ORAL | 5 refills | Status: DC

## 2017-04-20 MED ORDER — CARVEDILOL 6.25 MG PO TABS: 6.2500 mg | ORAL_TABLET | Freq: Two times a day (BID) | ORAL | 3 refills | Status: DC

## 2017-04-20 MED ORDER — FUROSEMIDE 20 MG PO TABS: 20.0000 mg | ORAL_TABLET | Freq: Every day | ORAL | 1 refills | Status: DC

## 2017-04-20 MED FILL — SPIRONOLACTONE 25 MG TABLET: 25 | 30 days supply | Qty: 30 | Fill #0

## 2017-04-20 MED FILL — hydrALAZINE HCL 10 MG TABS: 10 | 30 days supply | Qty: 30 | Fill #0

## 2017-04-20 MED FILL — FUROSEMIDE 20 MG TABLET: 20 | 90 days supply | Qty: 90 | Fill #0

## 2017-04-20 MED FILL — ISOSORBIDE MN ER 30 MG TAB: 30 | 90 days supply | Qty: 45 | Fill #0

## 2017-04-20 MED FILL — LEVOTHYROXINE 25 MCG TABLET: 25 | 60 days supply | Qty: 30 | Fill #0

## 2017-04-20 MED FILL — CARVEDILOL 6.25 MG TABLET: 6.25 | 90 days supply | Qty: 180 | Fill #0

## 2017-04-20 NOTE — Progress Notes (Signed)
Subjective:   Crystal [name] is a 83 y.o. female who presents for Medicare Annual (Subsequent) preventive examination.  Review of Systems:  No ROS.  Medicare Wellness Visit.  Cardiac Risk Factors include: advanced age (>31men, >52 women);dyslipidemia;hypertension  Sleep patterns: has restless sleep sometimes, feels rested on waking and gets up 1-2 times nightly to void.   Home Safety/Smoke Alarms: Feels safe in home. Smoke alarms in place.  Living environment; residence and Firearm Safety: Lives w/ husband. 1-story house/ trailer, firearms stored safely. Seat Belt Safety/Bike Helmet: Wears seat belt.   Counseling:   Eye Exam- Follows w/ eye doctor yearly Dental- Does not follow w/ dentist regularly. Full denture upper and lower.  Female:   Pap- Aged out       Mammo- Aged out Dexa scan- Declines CCS- Aged out2     Objective:     Vitals: BP 122/78 (BP Location: Left Arm, Patient Position: Sitting, Cuff Size: Normal)   Pulse 60   Temp 97.9 F (36.6 C) (Oral)   Resp 18   Wt 134 lb 12.8 oz (61.1 kg)   SpO2 94%   BMI 23.88 kg/m   Body mass index is 23.88 kg/m.   Tobacco History  Smoking Status  . Never Smoker  Smokeless Tobacco  . Never Used    Comment: never used tobacco     Counseling given: Not Answered   Past Medical History:  Diagnosis Date  . Anxiety   . Cardiomyopathy    non ischemic NL cos on cath 08/2009, EF to 15-20%, Her follow up  2D echo  in 10/2009 showed impoved EFo 35-40%  . CHF (congestive heart failure) ([place])   . History of lymphoma    Dr Martha Clan  . Hypercalcemia 04/20/2017  . Hypertension   . Hypothyroid   . Medicare annual wellness visit, subsequent 12/14/2014   Follows with Dr Marin Olp Follows with Dr Deatra Ina, gastroenterology, last colonoscopy in 2012, repeat in 2017 Last Pap 2011, always normal, no need for repeat Last MGM in 2011, no concerns, declines for now Follows with Dr Stanford Breed of cardiology   . Vitamin D deficiency 10/17/2015    Past Surgical History:  Procedure Laterality Date  . DILATION AND CURETTAGE OF UTERUS    . nasal polyps removed     age 34  . TONSILLECTOMY  1952   Family History  Problem Relation Age of Onset  . Diabetes Mother        deceased secondary to diabetes  . Hypertension Mother   . Vision loss Mother   . Pneumonia Father        died age 14 due to complications of pneumonia  . Colon polyps Father   . Liver disease Daughter   . Sarcoidosis Daughter   . Diabetes Sister   . Colon cancer Brother 68  . Cancer Brother   . Obesity Son   . Diabetes Brother   . Kidney disease Brother   . Diabetes Sister   . Sarcoidosis Sister   . Arthritis Sister   . Arthritis Sister   . Diabetes Sister   . Arthritis Sister   . Diabetes Sister   . Hypertension Daughter   . Hypertension Daughter   . Other Unknown        no obvious premature cardiovascular disease or familial cardiomyopathy is noted  . Alcohol abuse Neg Hx    History  Sexual Activity  . Sexual activity: Not on file    Comment: lives with husband, no  dietary restrictions, minimizes dairy    Outpatient Encounter Prescriptions as of 04/20/2017  Medication Sig  . carvedilol (COREG) 6.25 MG tablet Take 1 tablet (6.25 mg total) by mouth 2 (two) times daily.  . furosemide (LASIX) 20 MG tablet Take 1 tablet (20 mg total) by mouth daily.  . hydrALAZINE (APRESOLINE) 10 MG tablet Take 1 tablet (10 mg total) by mouth 3 (three) times daily. Schedule appointment for refills 2nd attempt  . isosorbide mononitrate (IMDUR) 30 MG 24 hr tablet Take 0.5 tablets (15 mg total) by mouth daily.  Marland Kitchen levothyroxine (SYNTHROID, LEVOTHROID) 25 MCG tablet Take 0.5 tablets (12.5 mcg total) by mouth daily.  . Multiple Vitamins-Minerals (CENTRUM PO) Take 1 tablet by mouth daily.   . Omega-3 Fatty Acids (FISH OIL PO) Take 1 tablet by mouth daily.  . Probiotic Product (PROBIOTIC DAILY PO) Take by mouth.  . spironolactone (ALDACTONE) 25 MG tablet Take 1 tablet (25  mg total) by mouth daily.  . [DISCONTINUED] carvedilol (COREG) 6.25 MG tablet Take 1 tablet (6.25 mg total) by mouth 2 (two) times daily.  . [DISCONTINUED] furosemide (LASIX) 20 MG tablet take 1 tablet by mouth once daily  . [DISCONTINUED] hydrALAZINE (APRESOLINE) 10 MG tablet Take 1 tablet (10 mg total) by mouth 3 (three) times daily. Schedule appointment for refills 2nd attempt  . [DISCONTINUED] isosorbide mononitrate (IMDUR) 30 MG 24 hr tablet take 1/2 tablet by mouth once daily  . [DISCONTINUED] levothyroxine (SYNTHROID, LEVOTHROID) 25 MCG tablet TAKE 1/2 TABLET BY MOUTH ONCE DAILY  . [DISCONTINUED] spironolactone (ALDACTONE) 25 MG tablet take 1 tablet by mouth daily  . [DISCONTINUED] cefdinir (OMNICEF) 300 MG capsule Take 1 capsule (300 mg total) by mouth 2 (two) times daily. Take 5 days   No facility-administered encounter medications on file as of 04/20/2017.     Activities of Daily Living In your present state of health, do you have any difficulty performing the following activities: 04/20/2017  Hearing? N  Vision? N  Difficulty concentrating or making decisions? N  Walking or climbing stairs? N  Dressing or bathing? N  Doing errands, shopping? N  Preparing Food and eating ? N  Using the Toilet? N  In the past six months, have you accidently leaked urine? N  Do you have problems with loss of bowel control? N  Managing your Medications? N  Managing your Finances? N  Housekeeping or managing your Housekeeping? N  Some recent data might be hidden    Patient Care Team: Mosie Lukes, MD as PCP - General (Family Medicine) Volanda Napoleon, MD as Consulting Physician (Oncology) Lelon Perla, MD as Consulting Physician (Cardiology)    Assessment:    Physical assessment deferred to PCP.  Exercise Activities and Dietary recommendations Current Exercise Habits: Structured exercise class;Home exercise routine. 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  Diet (meal preparation, eat out, water intake, caffeinated beverages, dairy products, fruits and vegetables): in general, a "healthy" diet  , well balanced, on average, 3 meals per day. Drinks water throughout the day. Breakfast: oatmeal or cream of wheat w/ raisins, PB, etc. Tries to avoid meat and sweets. Lunch: snack Dinner: varies    Goals    . Increase physical activity          Win more medals at [employer]!      Fall Risk Fall Risk  04/20/2017 10/20/2016 01/23/2016 07/18/2015 04/19/2014  Falls in the past year? No No No No No   Depression Screen  PHQ 2/9 Scores 04/20/2017 10/20/2016 07/18/2015 12/14/2014  PHQ - 2 Score 0 0 0 0     Cognitive Function       Ad8 score reviewed for issues:  Issues making decisions: No  Less interest in hobbies / activities: No  Repeats questions, stories (family complaining): No  Trouble using ordinary gadgets (microwave, computer, phone): No  Forgets the month or year: No  Mismanaging finances: No  Remembering appts: No  Daily problems with thinking and/or memory: No Ad8 score is= 0  There is no immunization history for the selected administration types on file for this patient. Screening Tests Health Maintenance  Topic Date Due  . COLONOSCOPY  06/15/2017 (Originally 08/10/2016)  . TETANUS/TDAP  10/18/2017 (Originally 12/28/1952)  . DEXA SCAN  04/28/2018 (Originally 12/28/2000)  . PNA vac Low Risk Adult (1 of 2 - PCV13) 05/04/2025 (Originally 12/28/2000)  . INFLUENZA VACCINE  06/16/2017      Plan:   Follow-up w/ PCP as directed.   I have personally reviewed and noted the following in the patient's chart:   . Medical and social history . Use of alcohol, tobacco or illicit drugs  . Current medications and supplements . Functional ability and status . Nutritional status . Physical activity . Advanced directives . List of other physicians . Vitals . Screenings to include cognitive, depression, and falls . Referrals and  appointments  In addition, I have reviewed and discussed with patient certain preventive protocols, quality metrics, and best practice recommendations. A written personalized care plan for preventive services as well as general preventive health recommendations were provided to patient.     Dorrene German, RN  04/20/2017

## 2017-04-20 NOTE — Assessment & Plan Note (Signed)
On Levothyroxine, continue to monitor 

## 2017-04-20 NOTE — Patient Instructions (Addendum)
[name] , Thank you for taking time to come for your Medicare Wellness Visit. I appreciate your ongoing commitment to your health goals. Please review the following plan we discussed and let me know if I can assist you in the future.   These are the goals we discussed: Goals    None      This is a list of the screening recommended for you and due dates:  Health Maintenance  Topic Date Due  . Colon Cancer Screening  06/15/2017*  . Tetanus Vaccine  10/18/2017*  . DEXA scan (bone density measurement)  04/28/2018*  . Pneumonia vaccines (1 of 2 - PCV13) 05/04/2025*  . Flu Shot  06/16/2017  *Topic was postponed. The date shown is not the original due date.    Preventive Care 38 Years and Older, Female Preventive care refers to lifestyle choices and visits with your health care provider that can promote health and wellness. What does preventive care include?  A yearly physical exam. This is also called an annual well check.  Dental exams once or twice a year.  Routine eye exams. Ask your health care provider how often you should have your eyes checked.  Personal lifestyle choices, including: ? Daily care of your teeth and gums. ? Regular physical activity. ? Eating a healthy diet. ? Avoiding tobacco and drug use. ? Limiting alcohol use. ? Practicing safe sex. ? Taking low-dose aspirin every day. ? Taking vitamin and mineral supplements as recommended by your health care provider. What happens during an annual well check? The services and screenings done by your health care provider during your annual well check will depend on your age, overall health, lifestyle risk factors, and family history of disease. Counseling Your health care provider may ask you questions about your:  Alcohol use.  Tobacco use.  Drug use.  Emotional well-being.  Home and relationship well-being.  Sexual activity.  Eating habits.  History of falls.  Memory and ability to understand  (cognition).  Work and work Statistician.  Reproductive health.  Screening You may have the following tests or measurements:  Height, weight, and BMI.  Blood pressure.  Lipid and cholesterol levels. These may be checked every 5 years, or more frequently if you are over 53 years old.  Skin check.  Lung cancer screening. You may have this screening every year starting at age 60 if you have a 30-pack-year history of smoking and currently smoke or have quit within the past 15 years.  Fecal occult blood test (FOBT) of the stool. You may have this test every year starting at age 76.  Flexible sigmoidoscopy or colonoscopy. You may have a sigmoidoscopy every 5 years or a colonoscopy every 10 years starting at age 84.  Hepatitis C blood test.  Hepatitis B blood test.  Sexually transmitted disease (STD) testing.  Diabetes screening. This is done by checking your blood sugar (glucose) after you have not eaten for a while (fasting). You may have this done every 1-3 years.  Bone density scan. This is done to screen for osteoporosis. You may have this done starting at age 33.  Mammogram. This may be done every 1-2 years. Talk to your health care provider about how often you should have regular mammograms.  Talk with your health care provider about your test results, treatment options, and if necessary, the need for more tests. Vaccines Your health care provider may recommend certain vaccines, such as:  Influenza vaccine. This is recommended every year.  Tetanus,  diphtheria, and acellular pertussis (Tdap, Td) vaccine. You may need a Td booster every 10 years.  Varicella vaccine. You may need this if you have not been vaccinated.  Zoster vaccine. You may need this after age 41.  Measles, mumps, and rubella (MMR) vaccine. You may need at least one dose of MMR if you were born in 1957 or later. You may also need a second dose.  Pneumococcal 13-valent conjugate (PCV13) vaccine. One dose  is recommended after age 65.  Pneumococcal polysaccharide (PPSV23) vaccine. One dose is recommended after age 6.  Meningococcal vaccine. You may need this if you have certain conditions.  Hepatitis A vaccine. You may need this if you have certain conditions or if you travel or work in places where you may be exposed to hepatitis A.  Hepatitis B vaccine. You may need this if you have certain conditions or if you travel or work in places where you may be exposed to hepatitis B.  Haemophilus influenzae type b (Hib) vaccine. You may need this if you have certain conditions.  Talk to your health care provider about which screenings and vaccines you need and how often you need them. This information is not intended to replace advice given to you by your health care provider. Make sure you discuss any questions you have with your health care provider. Document Released: 11/29/2015 Document Revised: 07/22/2016 Document Reviewed: 09/03/2015 Elsevier Interactive Patient Education  2017 [place].  Hypothyroidism Hypothyroidism is a disorder of the thyroid. The thyroid is a large gland that is located in the lower front of the neck. The thyroid releases hormones that control how the body works. With hypothyroidism, the thyroid does not make enough of these hormones. What are the causes? Causes of hypothyroidism may include:  Viral infections.  Pregnancy.  Your own defense system (immune system) attacking your thyroid.  Certain medicines.  Birth defects.  Past radiation treatments to your head or neck.  Past treatment with radioactive iodine.  Past surgical removal of part or all of your thyroid.  Problems with the gland that is located in the center of your brain (pituitary).  What are the signs or symptoms? Signs and symptoms of hypothyroidism may include:  Feeling as though you have no energy (lethargy).  Inability to tolerate cold.  Weight gain that is not explained by a  change in diet or exercise habits.  Dry skin.  Coarse hair.  Menstrual irregularity.  Slowing of thought processes.  Constipation.  Sadness or depression.  How is this diagnosed? Your health care provider may diagnose hypothyroidism with blood tests and ultrasound tests. How is this treated? Hypothyroidism is treated with medicine that replaces the hormones that your body does not make. After you begin treatment, it may take several weeks for symptoms to go away. Follow these instructions at home:  Take medicines only as directed by your health care provider.  If you start taking any new medicines, tell your health care provider.  Keep all follow-up visits as directed by your health care provider. This is important. As your condition improves, your dosage needs may change. You will need to have blood tests regularly so that your health care provider can watch your condition. Contact a health care provider if:  Your symptoms do not get better with treatment.  You are taking thyroid replacement medicine and: ? You sweat excessively. ? You have tremors. ? You feel anxious. ? You lose weight rapidly. ? You cannot tolerate heat. ? You have emotional  swings. ? You have diarrhea. ? You feel weak. Get help right away if:  You develop chest pain.  You develop an irregular heartbeat.  You develop a rapid heartbeat. This information is not intended to replace advice given to you by your health care provider. Make sure you discuss any questions you have with your health care provider. Document Released: 11/02/2005 Document Revised: 04/09/2016 Document Reviewed: 03/20/2014 Elsevier Interactive Patient Education  2017 [employer].

## 2017-04-20 NOTE — Assessment & Plan Note (Signed)
Noted on last blood draw. Check cmp, pth and vitamin d

## 2017-04-20 NOTE — Progress Notes (Signed)
Subjective:  I acted as a Education administrator for Dr. Charlett Blake. [name], [name]  Patient ID: Crystal [name], female    DOB: 02/25/1934, 83 y.o.   MRN: [id number]  Chief Complaint  Patient presents with  . Medicare Well Visit    HPI  Patient in today for a follow up visit. She is following up on her HTn, hyperlipidemia and other medical concerns. Patient states she has no acute concerns. She states she is doing well at home she continues to hike, and exercise daily. She denies any falls, and depression, very pleasant to speak with.  No recent febrile illness or acute hospitalizations. Denies CP/palp/SOB/HA/congestion/fevers/GI or GU c/o. Taking meds as prescribed   Patient Care Team: Mosie Lukes, MD as PCP - General (Family Medicine)   Past Medical History:  Diagnosis Date  . Anxiety   . Cardiomyopathy    non ischemic NL cos on cath 08/2009, EF to 15-20%, Her follow up  2D echo  in 10/2009 showed impoved EFo 35-40%  . CHF (congestive heart failure) ([place])   . History of lymphoma    Dr Martha Clan  . Hypercalcemia 04/20/2017  . Hypertension   . Hypothyroid   . Medicare annual wellness visit, subsequent 12/14/2014   Follows with Dr Marin Olp Follows with Dr Deatra Ina, gastroenterology, last colonoscopy in 2012, repeat in 2017 Last Pap 2011, always normal, no need for repeat Last MGM in 2011, no concerns, declines for now Follows with Dr Stanford Breed of cardiology   . Vitamin D deficiency 10/17/2015    Past Surgical History:  Procedure Laterality Date  . DILATION AND CURETTAGE OF UTERUS    . nasal polyps removed     age 35  . TONSILLECTOMY  1952    Family History  Problem Relation Age of Onset  . Diabetes Mother        deceased secondary to diabetes  . Hypertension Mother   . Vision loss Mother   . Pneumonia Father        died age 58 due to complications of pneumonia  . Colon polyps Father   . Liver disease Daughter   . Sarcoidosis Daughter   . Diabetes Sister   . Colon cancer Brother 51  .  Cancer Brother   . Obesity Son   . Diabetes Brother   . Kidney disease Brother   . Diabetes Sister   . Sarcoidosis Sister   . Arthritis Sister   . Arthritis Sister   . Diabetes Sister   . Arthritis Sister   . Diabetes Sister   . Hypertension Daughter   . Hypertension Daughter   . Other Unknown        no obvious premature cardiovascular disease or familial cardiomyopathy is noted  . Alcohol abuse Neg Hx     Social History   Social History  . Marital status: Married    Spouse name: N/A  . Number of children: N/A  . Years of education: N/A   Occupational History  . Not on file.   Social History Main Topics  . Smoking status: Never Smoker  . Smokeless tobacco: Never Used     Comment: never used tobacco  . Alcohol use No  . Drug use: No  . Sexual activity: Not on file     Comment: lives with husband, no dietary restrictions, minimizes dairy   Other Topics Concern  . Not on file   Social History Narrative   The patient is married ( husband Ivette Castronova)  She just  recently celebrated     her 52nd anniversary.  She has five children.  There is no active     tobacco or alcohol use history.          Smoking Status:  never   Caffeine use/day:  None   Does Patient Exercise:  yes    Outpatient Medications Prior to Visit  Medication Sig Dispense Refill  . Multiple Vitamins-Minerals (CENTRUM PO) Take 1 tablet by mouth daily.     . Omega-3 Fatty Acids (FISH OIL PO) Take 1 tablet by mouth daily.    . Probiotic Product (PROBIOTIC DAILY PO) Take by mouth.    . carvedilol (COREG) 6.25 MG tablet Take 1 tablet (6.25 mg total) by mouth 2 (two) times daily. 180 tablet 3  . furosemide (LASIX) 20 MG tablet take 1 tablet by mouth once daily 90 tablet 0  . hydrALAZINE (APRESOLINE) 10 MG tablet Take 1 tablet (10 mg total) by mouth 3 (three) times daily. Schedule appointment for refills 2nd attempt 30 tablet 0  . isosorbide mononitrate (IMDUR) 30 MG 24 hr tablet take 1/2 tablet by mouth  once daily 45 tablet 1  . levothyroxine (SYNTHROID, LEVOTHROID) 25 MCG tablet TAKE 1/2 TABLET BY MOUTH ONCE DAILY 30 tablet 5  . spironolactone (ALDACTONE) 25 MG tablet take 1 tablet by mouth daily 30 tablet 4  . cefdinir (OMNICEF) 300 MG capsule Take 1 capsule (300 mg total) by mouth 2 (two) times daily. Take 5 days 10 capsule 0   No facility-administered medications prior to visit.     Allergies  Allergen Reactions  . Lisinopril Swelling    Facial and lip swelling  . Iohexol      Desc: hives,itiching     Review of Systems  Constitutional: Negative for fever and malaise/fatigue.  HENT: Negative for congestion.   Eyes: Negative for blurred vision.  Respiratory: Negative for cough and shortness of breath.   Cardiovascular: Negative for chest pain, palpitations and leg swelling.  Gastrointestinal: Negative for vomiting.  Musculoskeletal: Negative for back pain.  Skin: Negative for rash.  Neurological: Negative for loss of consciousness and headaches.       Objective:    Physical Exam  Constitutional: She is oriented to person, place, and time. She appears well-developed and well-nourished. No distress.  HENT:  Head: Normocephalic and atraumatic.  Eyes: Conjunctivae are normal.  Neck: Normal range of motion. No thyromegaly present.  Cardiovascular: Normal rate and regular rhythm.   Pulmonary/Chest: Effort normal and breath sounds normal. She has no wheezes.  Abdominal: Soft. Bowel sounds are normal. There is no tenderness.  Musculoskeletal: Normal range of motion. She exhibits no edema or deformity.  Neurological: She is alert and oriented to person, place, and time.  Skin: Skin is warm and dry. She is not diaphoretic.  Psychiatric: She has a normal mood and affect.    BP 122/78 (BP Location: Left Arm, Patient Position: Sitting, Cuff Size: Normal)   Pulse 60   Temp 97.9 F (36.6 C) (Oral)   Resp 18   Wt 134 lb 12.8 oz (61.1 kg)   SpO2 94%   BMI 23.88 kg/m  Wt  Readings from Last 3 Encounters:  04/20/17 134 lb 12.8 oz (61.1 kg)  01/21/17 135 lb (61.2 kg)  10/20/16 132 lb 6 oz (60 kg)   BP Readings from Last 3 Encounters:  04/20/17 122/78  01/21/17 123/70  10/20/16 104/68     There is no immunization history for the selected administration types  on file for this patient.  Health Maintenance  Topic Date Due  . COLONOSCOPY  06/15/2017 (Originally 08/10/2016)  . TETANUS/TDAP  10/18/2017 (Originally [birth year]28/1952)  . DEXA SCAN  04/28/2018 (Originally 12/28/2000)  . PNA vac Low Risk Adult (1 of 2 - PCV13) 05/04/2025 (Originally 12/28/2000)  . INFLUENZA VACCINE  06/16/2017    Lab Results  Component Value Date   WBC 3.8 (L) 01/21/2017   HGB 11.9 01/21/2017   HCT 36.2 01/21/2017   PLT 127 (L) 01/21/2017   GLUCOSE 75 01/21/2017   CHOL 208 (H) 10/20/2016   TRIG 131.0 10/20/2016   HDL 55.00 10/20/2016   LDLCALC 126 (H) 10/20/2016   ALT 25 01/21/2017   AST 27 01/21/2017   NA 138 01/21/2017   K 5.1 01/21/2017   CL 100 10/20/2016   CREATININE 1.8 (H) 01/21/2017   BUN 30.0 (H) 01/21/2017   CO2 30 (H) 01/21/2017   TSH 3.35 10/20/2016   INR 1.08 09/05/2009   HGBA1C 6.1 10/20/2016    Lab Results  Component Value Date   TSH 3.35 10/20/2016   Lab Results  Component Value Date   WBC 3.8 (L) 01/21/2017   HGB 11.9 01/21/2017   HCT 36.2 01/21/2017   MCV 91 01/21/2017   PLT 127 (L) 01/21/2017   Lab Results  Component Value Date   NA 138 01/21/2017   K 5.1 01/21/2017   CHLORIDE 101 01/21/2017   CO2 30 (H) 01/21/2017   GLUCOSE 75 01/21/2017   BUN 30.0 (H) 01/21/2017   CREATININE 1.8 (H) 01/21/2017   BILITOT 0.50 01/21/2017   ALKPHOS 124 01/21/2017   AST 27 01/21/2017   ALT 25 01/21/2017   PROT 8.9 (H) 01/21/2017   ALBUMIN 3.7 01/21/2017   CALCIUM 11.1 (H) 01/21/2017   ANIONGAP 8 01/21/2017   EGFR 29 (L) 01/21/2017   GFR 42.23 (L) 10/20/2016   Lab Results  Component Value Date   CHOL 208 (H) 10/20/2016   Lab Results  Component  Value Date   HDL 55.00 10/20/2016   Lab Results  Component Value Date   LDLCALC 126 (H) 10/20/2016   Lab Results  Component Value Date   TRIG 131.0 10/20/2016   Lab Results  Component Value Date   CHOLHDL 4 10/20/2016   Lab Results  Component Value Date   HGBA1C 6.1 10/20/2016         Assessment & Plan:   Problem List Items Addressed This Visit    Hypothyroidism    On Levothyroxine, continue to monitor      Relevant Medications   carvedilol (COREG) 6.25 MG tablet   levothyroxine (SYNTHROID, LEVOTHROID) 25 MCG tablet   Essential hypertension - Primary    Well controlled, no changes to meds. Encouraged heart healthy diet such as the DASH diet and exercise as tolerated.       Relevant Medications   carvedilol (COREG) 6.25 MG tablet   furosemide (LASIX) 20 MG tablet   isosorbide mononitrate (IMDUR) 30 MG 24 hr tablet   spironolactone (ALDACTONE) 25 MG tablet   hydrALAZINE (APRESOLINE) 10 MG tablet   Other Relevant Orders   CBC with Differential/Platelet   Comprehensive metabolic panel   TSH   Hyperglycemia    minimize simple carbs. Increase exercise as tolerated.      Relevant Orders   Hemoglobin A1c   Hyperlipidemia, mixed    Encouraged heart healthy diet, increase exercise, avoid trans fats, consider a krill oil cap daily      Relevant Medications  carvedilol (COREG) 6.25 MG tablet   furosemide (LASIX) 20 MG tablet   isosorbide mononitrate (IMDUR) 30 MG 24 hr tablet   spironolactone (ALDACTONE) 25 MG tablet   hydrALAZINE (APRESOLINE) 10 MG tablet   Other Relevant Orders   Lipid panel   TSH   Hypercalcemia    Noted on last blood draw. Check cmp, pth and vitamin d      Relevant Orders   VITAMIN D 25 Hydroxy (Vit-D Deficiency, Fractures)    Other Visit Diagnoses    NICM (nonischemic cardiomyopathy) (HCC)       Relevant Medications   carvedilol (COREG) 6.25 MG tablet   furosemide (LASIX) 20 MG tablet   isosorbide mononitrate (IMDUR) 30 MG 24 hr  tablet   spironolactone (ALDACTONE) 25 MG tablet   hydrALAZINE (APRESOLINE) 10 MG tablet      I have discontinued Ms. Hosick's cefdinir. I have also changed her furosemide, isosorbide mononitrate, spironolactone, and levothyroxine. Additionally, I am having her maintain her Multiple Vitamins-Minerals (CENTRUM PO), Omega-3 Fatty Acids (FISH OIL PO), Probiotic Product (PROBIOTIC DAILY PO), carvedilol, and hydrALAZINE.  Meds ordered this encounter  Medications  . carvedilol (COREG) 6.25 MG tablet    Sig: Take 1 tablet (6.25 mg total) by mouth 2 (two) times daily.    Dispense:  180 tablet    Refill:  3  . furosemide (LASIX) 20 MG tablet    Sig: Take 1 tablet (20 mg total) by mouth daily.    Dispense:  90 tablet    Refill:  1  . isosorbide mononitrate (IMDUR) 30 MG 24 hr tablet    Sig: Take 0.5 tablets (15 mg total) by mouth daily.    Dispense:  45 tablet    Refill:  1  . spironolactone (ALDACTONE) 25 MG tablet    Sig: Take 1 tablet (25 mg total) by mouth daily.    Dispense:  30 tablet    Refill:  4  . hydrALAZINE (APRESOLINE) 10 MG tablet    Sig: Take 1 tablet (10 mg total) by mouth 3 (three) times daily. Schedule appointment for refills 2nd attempt    Dispense:  30 tablet    Refill:  0    PLEASE SCHEDULE AN APPT FOR FUTURE REFILLS 2nd attempt  . levothyroxine (SYNTHROID, LEVOTHROID) 25 MCG tablet    Sig: Take 0.5 tablets (12.5 mcg total) by mouth daily.    Dispense:  30 tablet    Refill:  5    CMA served as scribe during this visit. History, Physical and Plan performed by medical provider. Documentation and orders reviewed and attested to.  Penni Homans, MD

## 2017-04-20 NOTE — Assessment & Plan Note (Signed)
minimize simple carbs. Increase exercise as tolerated.  

## 2017-04-20 NOTE — Assessment & Plan Note (Signed)
Well controlled, no changes to meds. Encouraged heart healthy diet such as the DASH diet and exercise as tolerated.  °

## 2017-04-20 NOTE — Assessment & Plan Note (Signed)
Encouraged heart healthy diet, increase exercise, avoid trans fats, consider a krill oil cap daily 

## 2017-04-26 ENCOUNTER — Other Ambulatory Visit: Payer: Self-pay | Admitting: Physician Assistant

## 2017-04-26 ENCOUNTER — Other Ambulatory Visit: Payer: Self-pay | Admitting: Cardiology

## 2017-04-26 NOTE — Telephone Encounter (Signed)
Rx(s) sent to pharmacy electronically.  

## 2017-05-13 ENCOUNTER — Telehealth: Payer: Self-pay | Admitting: Family Medicine

## 2017-05-13 NOTE — Telephone Encounter (Signed)
Pt called in because she is having bruising, itching, and swelling on her legs. She would like to be advised on which medication is causing this? She would like a call back?    CB: [phone]

## 2017-05-13 NOTE — Telephone Encounter (Signed)
Hard to know but if it is new the newest med is carvedilol. So we can stop it and switch her to Toprol XL 25 mg tabs, 1 tab po daily, disp #30 with 2 rf. If no improvement come in for evaluation. Take Zyrtec 10 mg po daily OTC and see if that helps

## 2017-05-13 NOTE — Telephone Encounter (Signed)
Please advise    PC 

## 2017-05-13 NOTE — Telephone Encounter (Signed)
Spoke w/ Pt, informed of recommendations. Pt has decided to stay on current med regimen and will call if no improvement.

## 2017-05-24 NOTE — Progress Notes (Signed)
HPI: FU nonischemic cardiomyopathy. Cardiac catheterization in 2010 showed an ejection fraction of 15-20% and normal coronary arteries. Echocardiogram repeated January 2017 and showed ejection fraction 20-25%. There was restrictive filling. There was moderate to severe mitral regurgitation, mild left atrial enlargement, mildly reduced RV function and moderately elevated pulmonary pressures. Patient admitted with syncope previously felt to potentially be vagally mediated. However given reduced LV function I discussed the possibility of of life vest or ICD. However she declined and preferred only medical therapy. The risk of sudden death was explained. Since last seen, recently she had some itching in her lower extremities and her carvedilol was held. However her itching has not improved. She denies dyspnea, chest pain or syncope. She has not had pedal edema.  Current Outpatient Prescriptions  Medication Sig Dispense Refill  . carvedilol (COREG) 6.25 MG tablet Take 1 tablet (6.25 mg total) by mouth 2 (two) times daily. 180 tablet 3  . carvedilol (COREG) 6.25 MG tablet take 1 tablet by mouth twice a day 180 tablet 3  . furosemide (LASIX) 20 MG tablet Take 1 tablet (20 mg total) by mouth daily. 90 tablet 1  . hydrALAZINE (APRESOLINE) 10 MG tablet Take 1 tablet (10 mg total) by mouth 3 (three) times daily. Schedule appointment for refills 2nd attempt 30 tablet 0  . isosorbide mononitrate (IMDUR) 30 MG 24 hr tablet Take 0.5 tablets (15 mg total) by mouth daily. 45 tablet 1  . isosorbide mononitrate (IMDUR) 30 MG 24 hr tablet Take 0.5 tablets (15 mg total) by mouth daily. 45 tablet 0  . levothyroxine (SYNTHROID, LEVOTHROID) 25 MCG tablet Take 0.5 tablets (12.5 mcg total) by mouth daily. (Patient taking differently: Take 25 mcg by mouth daily. ) 30 tablet 5  . Multiple Vitamins-Minerals (CENTRUM PO) Take 1 tablet by mouth daily.     . Omega-3 Fatty Acids (FISH OIL PO) Take 1 tablet by mouth daily.      . Probiotic Product (PROBIOTIC DAILY PO) Take by mouth.    . spironolactone (ALDACTONE) 25 MG tablet Take 1 tablet (25 mg total) by mouth daily. 30 tablet 4   No current facility-administered medications for this visit.      Past Medical History:  Diagnosis Date  . Anxiety   . Cardiomyopathy    non ischemic NL cos on cath 08/2009, EF to 15-20%, Her follow up  2D echo  in 10/2009 showed impoved EFo 35-40%  . CHF (congestive heart failure) ([place])   . History of lymphoma    Dr Martha Clan  . Hypercalcemia 04/20/2017  . Hypertension   . Hypothyroid   . Medicare annual wellness visit, subsequent 12/14/2014   Follows with Dr Marin Olp Follows with Dr Deatra Ina, gastroenterology, last colonoscopy in 2012, repeat in 2017 Last Pap 2011, always normal, no need for repeat Last MGM in 2011, no concerns, declines for now Follows with Dr Stanford Breed of cardiology   . Vitamin D deficiency 10/17/2015    Past Surgical History:  Procedure Laterality Date  . DILATION AND CURETTAGE OF UTERUS    . nasal polyps removed     age 63  . TONSILLECTOMY  1952    Social History   Social History  . Marital status: Married    Spouse name: N/A  . Number of children: N/A  . Years of education: N/A   Occupational History  . Not on file.   Social History Main Topics  . Smoking status: Never Smoker  . Smokeless tobacco: Never Used  Comment: never used tobacco  . Alcohol use No  . Drug use: No  . Sexual activity: Not on file     Comment: lives with husband, no dietary restrictions, minimizes dairy   Other Topics Concern  . Not on file   Social History Narrative   The patient is married ( husband Jesse Hirst)  She just recently celebrated     her 52nd anniversary.  She has five children.  There is no active     tobacco or alcohol use history.          Smoking Status:  never   Caffeine use/day:  None   Does Patient Exercise:  yes    Family History  Problem Relation Age of Onset  . Diabetes Mother         deceased secondary to diabetes  . Hypertension Mother   . Vision loss Mother   . Pneumonia Father        died age 48 due to complications of pneumonia  . Colon polyps Father   . Liver disease Daughter   . Sarcoidosis Daughter   . Diabetes Sister   . Colon cancer Brother 34  . Cancer Brother   . Obesity Son   . Diabetes Brother   . Kidney disease Brother   . Diabetes Sister   . Sarcoidosis Sister   . Arthritis Sister   . Arthritis Sister   . Diabetes Sister   . Arthritis Sister   . Diabetes Sister   . Hypertension Daughter   . Hypertension Daughter   . Other Unknown        no obvious premature cardiovascular disease or familial cardiomyopathy is noted  . Alcohol abuse Neg Hx     ROS: no fevers or chills, productive cough, hemoptysis, dysphasia, odynophagia, melena, hematochezia, dysuria, hematuria, rash, seizure activity, orthopnea, PND, pedal edema, claudication. Remaining systems are negative.  Physical Exam: Well-developed well-nourished in no acute distress.  Skin is warm and dry.  HEENT is normal.  Neck is supple.  Chest is clear to auscultation with normal expansion.  Cardiovascular exam is regular rate and rhythm.  Abdominal exam nontender or distended. No masses palpated. Extremities show no edema. neuro grossly intact  ECG- Sinus rhythm at a rate of 97. Left anterior fascicular block. Right bundle branch block. Occasional PVCs. Left ventricular hypertrophy. personally reviewed  A/P  1 Chronic systolic congestive heart failure-patient appears to be doing well from a symptomatic standpoint and is euvolemic on examination. Continue present dose of diuretics.   2 hypertension-blood pressure is controlled. Continue present medications.  3 nonischemic cardiomyopathy-patient has had angioedema with ACE inhibitor previously. We will continue with hydralazine and nitrates. She felt carvedilol may be causing itching but her symptoms have not improved off of this  medication. I will resume 6.25 mg twice a day. Patient has declined ICD previously and understands the risk of sudden death. Repeat echocardiogram.  4 history of syncope-no recurrent episodes.  [name], MD

## 2017-06-03 ENCOUNTER — Encounter: Payer: Self-pay | Admitting: Cardiology

## 2017-06-03 ENCOUNTER — Ambulatory Visit (INDEPENDENT_AMBULATORY_CARE_PROVIDER_SITE_OTHER): Payer: Medicare Other | Admitting: Cardiology

## 2017-06-03 VITALS — BP 124/78 | HR 97 | Ht 63.0 in | Wt 132.0 lb

## 2017-06-03 DIAGNOSIS — I10 Essential (primary) hypertension: Secondary | ICD-10-CM | POA: Diagnosis not present

## 2017-06-03 DIAGNOSIS — I5022 Chronic systolic (congestive) heart failure: Secondary | ICD-10-CM | POA: Diagnosis not present

## 2017-06-03 DIAGNOSIS — I428 Other cardiomyopathies: Secondary | ICD-10-CM

## 2017-06-03 MED ORDER — CARVEDILOL 6.25 MG PO TABS: 6.2500 mg | ORAL_TABLET | Freq: Two times a day (BID) | ORAL | 3 refills | Status: DC

## 2017-06-03 MED ORDER — HYDRALAZINE HCL 10 MG PO TABS: 10.0000 mg | ORAL_TABLET | Freq: Three times a day (TID) | ORAL | 3 refills | Status: DC

## 2017-06-03 MED FILL — hydrALAZINE HCL 10 MG TABS: 10 | 90 days supply | Qty: 270 | Fill #0

## 2017-06-03 NOTE — Patient Instructions (Signed)
Medication Instructions:   Refill sent to the pharmacy electronically.   Testing/Procedures:  Your physician has requested that you have an echocardiogram. Echocardiography is a painless test that uses sound waves to create images of your heart. It provides your doctor with information about the size and shape of your heart and how well your heart's chambers and valves are working. This procedure takes approximately one hour. There are no restrictions for this procedure.    Follow-Up:  Your physician wants you to follow-up in: [place] will receive a reminder letter in the mail two months in advance. If you don't receive a letter, please call our office to schedule the follow-up appointment.   If you need a refill on your cardiac medications before your next appointment, please call your pharmacy.

## 2017-06-07 MED FILL — LEVOTHYROXINE 25 MCG TABLET: 25 | 60 days supply | Qty: 30 | Fill #1

## 2017-06-11 ENCOUNTER — Ambulatory Visit (HOSPITAL_COMMUNITY): Payer: Medicare Other | Attending: Cardiology

## 2017-06-11 ENCOUNTER — Other Ambulatory Visit: Payer: Self-pay

## 2017-06-11 ENCOUNTER — Telehealth: Payer: Self-pay | Admitting: Cardiology

## 2017-06-11 DIAGNOSIS — I509 Heart failure, unspecified: Secondary | ICD-10-CM | POA: Insufficient documentation

## 2017-06-11 DIAGNOSIS — I428 Other cardiomyopathies: Secondary | ICD-10-CM | POA: Insufficient documentation

## 2017-06-11 DIAGNOSIS — E785 Hyperlipidemia, unspecified: Secondary | ICD-10-CM | POA: Diagnosis not present

## 2017-06-11 DIAGNOSIS — I110 Hypertensive heart disease with heart failure: Secondary | ICD-10-CM | POA: Insufficient documentation

## 2017-06-11 DIAGNOSIS — I340 Nonrheumatic mitral (valve) insufficiency: Secondary | ICD-10-CM | POA: Diagnosis not present

## 2017-06-11 NOTE — Telephone Encounter (Signed)
Patient aware of results and verbalized understanding.

## 2017-06-11 NOTE — Telephone Encounter (Signed)
New message    Pt states a nurse called her and she is returning the phone call

## 2017-07-14 MED FILL — FUROSEMIDE 20 MG TAB: 20 | 90 days supply | Qty: 90 | Fill #1

## 2017-07-14 MED FILL — ISOSORBIDE MN ER 30 MG TAB: 30 | 90 days supply | Qty: 45 | Fill #1

## 2017-07-16 ENCOUNTER — Telehealth: Payer: Self-pay | Admitting: Family Medicine

## 2017-07-16 ENCOUNTER — Other Ambulatory Visit: Payer: Self-pay

## 2017-07-16 MED ORDER — LEVOTHYROXINE SODIUM 25 MCG PO TABS: 25.0000 ug | ORAL_TABLET | Freq: Every day | ORAL | 1 refills | Status: DC

## 2017-07-16 MED FILL — LEVOTHYROXINE 25 MCG TABLET: 25 | 30 days supply | Qty: 30 | Fill #0

## 2017-07-16 NOTE — Telephone Encounter (Signed)
Pt aware Rx faxed/thx dmf

## 2017-07-16 NOTE — Telephone Encounter (Signed)
refaxed to corrected pharmacy/thx dmf

## 2017-07-16 NOTE — Telephone Encounter (Signed)
Faxed Synthroid for 1qd per last lab note 59mcg/#30+1 till Oct/thx dmf

## 2017-07-16 NOTE — Telephone Encounter (Signed)
Caller name: Relation to HT:DSKA Call back [phone] Pharmacy[facility]  Reason for call: pt is needing rx for levothyroxine (SYNTHROID, LEVOTHROID) 25 MCG tablet, pt states she only has 3 tabs left,, please call when available for pick pt will run out of meds on Monday and the pharmacy is closed on Monday.

## 2017-08-16 MED FILL — LEVOTHYROXINE 25 MCG TABLET: 25 | 30 days supply | Qty: 30 | Fill #1

## 2017-08-20 ENCOUNTER — Ambulatory Visit (INDEPENDENT_AMBULATORY_CARE_PROVIDER_SITE_OTHER): Payer: Medicare Other | Admitting: Family Medicine

## 2017-08-20 ENCOUNTER — Encounter: Payer: Self-pay | Admitting: Family Medicine

## 2017-08-20 DIAGNOSIS — I10 Essential (primary) hypertension: Secondary | ICD-10-CM

## 2017-08-20 DIAGNOSIS — D649 Anemia, unspecified: Secondary | ICD-10-CM

## 2017-08-20 DIAGNOSIS — R739 Hyperglycemia, unspecified: Secondary | ICD-10-CM

## 2017-08-20 DIAGNOSIS — E039 Hypothyroidism, unspecified: Secondary | ICD-10-CM | POA: Diagnosis not present

## 2017-08-20 DIAGNOSIS — E782 Mixed hyperlipidemia: Secondary | ICD-10-CM

## 2017-08-20 LAB — CBC
HCT: 34 % — ABNORMAL LOW (ref 36.0–46.0)
Hemoglobin: 11 g/dL — ABNORMAL LOW (ref 12.0–15.0)
MCHC: 32.4 g/dL (ref 30.0–36.0)
MCV: 91 fl (ref 78.0–100.0)
Platelets: 152 10*3/uL (ref 150.0–400.0)
RBC: 3.74 Mil/uL — AB (ref 3.87–5.11)
RDW: 12.8 % (ref 11.5–15.5)
WBC: 3.8 10*3/uL — AB (ref 4.0–10.5)

## 2017-08-20 LAB — LIPID PANEL
CHOLESTEROL: 167 mg/dL (ref 0–200)
HDL: 57.8 mg/dL (ref >39.00)
LDL CALC: 90 mg/dL (ref 0–99)
NONHDL: 109.41
Total CHOL/HDL Ratio: 3
Triglycerides: 97 mg/dL (ref 0.0–149.0)
VLDL: 19.4 mg/dL (ref 0.0–40.0)

## 2017-08-20 LAB — COMPREHENSIVE METABOLIC PANEL
ALBUMIN: 3.6 g/dL (ref 3.5–5.2)
ALK PHOS: 98 U/L (ref 39–117)
ALT: 21 U/L (ref 0–35)
AST: 21 U/L (ref 0–37)
BUN: 14 mg/dL (ref 6–23)
CHLORIDE: 101 meq/L (ref 96–112)
CO2: 32 mEq/L (ref 19–32)
CREATININE: 1.32 mg/dL — AB (ref 0.40–1.20)
Calcium: 10 mg/dL (ref 8.4–10.5)
GFR: 49.6 mL/min — ABNORMAL LOW (ref >60.00)
GLUCOSE: 112 mg/dL — AB (ref 70–99)
Potassium: 4.7 mEq/L (ref 3.5–5.1)
SODIUM: 136 meq/L (ref 135–145)
TOTAL PROTEIN: 8.9 g/dL — AB (ref 6.0–8.3)
Total Bilirubin: 0.5 mg/dL (ref 0.2–1.2)

## 2017-08-20 LAB — HEMOGLOBIN A1C: Hgb A1c MFr Bld: 5.8 % (ref 4.6–6.5)

## 2017-08-20 LAB — TSH: TSH: 3.52 u[IU]/mL (ref 0.35–4.50)

## 2017-08-20 MED FILL — CARVEDILOL 6.25 MG TABLET: 6.25 | 90 days supply | Qty: 180 | Fill #1

## 2017-08-20 NOTE — Assessment & Plan Note (Signed)
Well controlled, no changes to meds. Encouraged heart healthy diet such as the DASH diet and exercise as tolerated.  °

## 2017-08-20 NOTE — Assessment & Plan Note (Signed)
On Levothyroxine, continue to monitor 

## 2017-08-20 NOTE — Assessment & Plan Note (Signed)
Encouraged heart healthy diet, increase exercise, avoid trans fats, consider a krill oil cap daily 

## 2017-08-20 NOTE — Patient Instructions (Signed)

## 2017-08-20 NOTE — Progress Notes (Signed)
Subjective:  I acted as a Education administrator for Crystal. Rogue [name], CMA   Patient ID: Crystal [name], female    DOB: 1934/01/25, 83 y.o.   MRN: [id number]  Chief Complaint  Patient presents with  . Follow-up  . Hypertension    HPI  Patient is in today for 4 month follow up and to discuss Hx of Cancer, she was not sure we were aware. No recent febrile illness or hospitalizations. She continues to participate in [employer]. She feels well. She does note some tinnitus b/l but no hearing loss. This is long term and not notably worse recently. No associated symptms noted. Denies CP/palp/SOB/HA/congestion/fevers/GI or GU c/o. Taking meds as prescribed  Patient Care Team: Crystal Lukes, MD as PCP - General (Family Medicine) Crystal Olp Rudell Cobb, MD as Consulting Physician (Oncology) Crystal [name] Crystal Bors, MD as Consulting Physician (Cardiology)   Past Medical History:  Diagnosis Date  . Anemia 08/21/2017  . Anxiety   . Cardiomyopathy    non ischemic NL cos on cath 08/2009, EF to 15-20%, Her follow up  2D echo  in 10/2009 showed impoved EFo 35-40%  . CHF (congestive heart failure) ([place])   . History of lymphoma    Crystal Crystal [name]  . Hypercalcemia 04/20/2017  . Hypertension   . Hypothyroid   . Medicare annual wellness visit, subsequent 12/14/2014   Follows with Crystal Crystal Olp Follows with Crystal [name], gastroenterology, last colonoscopy in 2012, repeat in 2017 Last Pap 2011, always normal, no need for repeat Last MGM in 2011, no concerns, declines for now Follows with Crystal Crystal [name] of cardiology   . Vitamin D deficiency 10/17/2015    Past Surgical History:  Procedure Laterality Date  . DILATION AND CURETTAGE OF UTERUS    . nasal polyps removed     age 32  . TONSILLECTOMY  1952    Family History  Problem Relation Age of Onset  . Diabetes Mother        deceased secondary to diabetes  . Hypertension Mother   . Vision loss Mother   . Pneumonia Father        died age 41 due to complications of pneumonia  .  Colon polyps Father   . Liver disease Daughter   . Sarcoidosis Daughter   . Diabetes Sister   . Colon cancer Brother 52  . Cancer Brother   . Obesity Son   . Diabetes Brother   . Kidney disease Brother   . Diabetes Sister   . Sarcoidosis Sister   . Arthritis Sister   . Arthritis Sister   . Diabetes Sister   . Arthritis Sister   . Diabetes Sister   . Hypertension Daughter   . Hypertension Daughter   . Other Unknown        no obvious premature cardiovascular disease or familial cardiomyopathy is noted  . Alcohol abuse Neg Hx     Social History   Social History  . Marital status: Married    Spouse name: N/A  . Number of children: N/A  . Years of education: N/A   Occupational History  . Not on file.   Social History Main Topics  . Smoking status: Never Smoker  . Smokeless tobacco: Never Used     Comment: never used tobacco  . Alcohol use No  . Drug use: No  . Sexual activity: Not on file     Comment: lives with husband, no dietary restrictions, minimizes dairy   Other Topics Concern  .  Not on file   Social History Narrative   The patient is married ( husband Crystal [name])  She just recently celebrated     her 52nd anniversary.  She has five children.  There is no active     tobacco or alcohol use history.          Smoking Status:  never   Caffeine use/day:  None   Does Patient Exercise:  yes    Outpatient Medications Prior to Visit  Medication Sig Dispense Refill  . carvedilol (COREG) 6.25 MG tablet Take 1 tablet (6.25 mg total) by mouth 2 (two) times daily. 180 tablet 3  . furosemide (LASIX) 20 MG tablet Take 1 tablet (20 mg total) by mouth daily. 90 tablet 1  . hydrALAZINE (APRESOLINE) 10 MG tablet Take 1 tablet (10 mg total) by mouth 3 (three) times daily. 270 tablet 3  . isosorbide mononitrate (IMDUR) 30 MG 24 hr tablet Take 0.5 tablets (15 mg total) by mouth daily. 45 tablet 0  . levothyroxine (SYNTHROID, LEVOTHROID) 25 MCG tablet Take 1 tablet (25 mcg  total) by mouth daily. 30 tablet 1  . Multiple Vitamins-Minerals (CENTRUM PO) Take 1 tablet by mouth daily.     . Omega-3 Fatty Acids (FISH OIL PO) Take 1 tablet by mouth daily.    . Probiotic Product (PROBIOTIC DAILY PO) Take by mouth.    . spironolactone (ALDACTONE) 25 MG tablet Take 1 tablet (25 mg total) by mouth daily. 30 tablet 4  . isosorbide mononitrate (IMDUR) 30 MG 24 hr tablet Take 0.5 tablets (15 mg total) by mouth daily. 45 tablet 1   No facility-administered medications prior to visit.     Allergies  Allergen Reactions  . Lisinopril Swelling    Facial and lip swelling  . Iohexol      Desc: hives,itiching     Review of Systems  Constitutional: Negative for fever and malaise/fatigue.  HENT: Positive for tinnitus. Negative for congestion, ear pain and hearing loss.   Eyes: Negative for blurred vision.  Respiratory: Negative for shortness of breath.   Cardiovascular: Negative for chest pain, palpitations and leg swelling.  Gastrointestinal: Negative for abdominal pain, blood in stool and nausea.  Genitourinary: Negative for dysuria and frequency.  Musculoskeletal: Negative for falls.  Skin: Negative for rash.  Neurological: Negative for dizziness, loss of consciousness and headaches.  Endo/Heme/Allergies: Negative for environmental allergies.  Psychiatric/Behavioral: Negative for depression. The patient is not nervous/anxious.        Objective:    Physical Exam  Constitutional: She is oriented to person, place, and time. She appears well-developed and well-nourished. No distress.  HENT:  Head: Normocephalic and atraumatic.  Eyes: Conjunctivae are normal.  Neck: Neck supple. No thyromegaly present.  Cardiovascular: Normal rate and regular rhythm.   Murmur heard. Pulmonary/Chest: Effort normal and breath sounds normal. No respiratory distress.  Abdominal: Soft. Bowel sounds are normal. She exhibits no distension and no mass. There is no tenderness.    Musculoskeletal: She exhibits no edema.  Lymphadenopathy:    She has no cervical adenopathy.  Neurological: She is alert and oriented to person, place, and time.  Skin: Skin is warm and dry.  Psychiatric: She has a normal mood and affect. Her behavior is normal.    BP 121/69   Pulse 74   Temp 98.2 F (36.8 C) (Oral)   Ht '5\' 3"'  (1.6 m)   Wt 135 lb (61.2 kg)   SpO2 98%   BMI 23.91 kg/m  Wt Readings from Last 3 Encounters:  08/20/17 135 lb (61.2 kg)  06/03/17 132 lb (59.9 kg)  04/20/17 134 lb 12.8 oz (61.1 kg)   BP Readings from Last 3 Encounters:  08/20/17 121/69  06/03/17 124/78  04/20/17 122/78     There is no immunization history for the selected administration types on file for this patient.  Health Maintenance  Topic Date Due  . COLONOSCOPY  08/10/2016  . TETANUS/TDAP  10/18/2017 (Originally 12/28/1952)  . DEXA SCAN  04/28/2018 (Originally 12/28/2000)  . INFLUENZA VACCINE  Excluded  . PNA vac Low Risk Adult  Excluded    Lab Results  Component Value Date   WBC 3.8 (L) 08/20/2017   HGB 11.0 (L) 08/20/2017   HCT 34.0 (L) 08/20/2017   PLT 152.0 08/20/2017   GLUCOSE 112 (H) 08/20/2017   CHOL 167 08/20/2017   TRIG 97.0 08/20/2017   HDL 57.80 08/20/2017   LDLCALC 90 08/20/2017   ALT 21 08/20/2017   AST 21 08/20/2017   NA 136 08/20/2017   K 4.7 08/20/2017   CL 101 08/20/2017   CREATININE 1.32 (H) 08/20/2017   BUN 14 08/20/2017   CO2 32 08/20/2017   TSH 3.52 08/20/2017   INR 1.08 09/05/2009   HGBA1C 5.8 08/20/2017    Lab Results  Component Value Date   TSH 3.52 08/20/2017   Lab Results  Component Value Date   WBC 3.8 (L) 08/20/2017   HGB 11.0 (L) 08/20/2017   HCT 34.0 (L) 08/20/2017   MCV 91.0 08/20/2017   PLT 152.0 08/20/2017   Lab Results  Component Value Date   NA 136 08/20/2017   K 4.7 08/20/2017   CHLORIDE 101 01/21/2017   CO2 32 08/20/2017   GLUCOSE 112 (H) 08/20/2017   BUN 14 08/20/2017   CREATININE 1.32 (H) 08/20/2017   BILITOT 0.5  08/20/2017   ALKPHOS 98 08/20/2017   AST 21 08/20/2017   ALT 21 08/20/2017   PROT 8.9 (H) 08/20/2017   ALBUMIN 3.6 08/20/2017   CALCIUM 10.0 08/20/2017   ANIONGAP 8 01/21/2017   EGFR 29 (L) 01/21/2017   GFR 49.60 (L) 08/20/2017   Lab Results  Component Value Date   CHOL 167 08/20/2017   Lab Results  Component Value Date   HDL 57.80 08/20/2017   Lab Results  Component Value Date   LDLCALC 90 08/20/2017   Lab Results  Component Value Date   TRIG 97.0 08/20/2017   Lab Results  Component Value Date   CHOLHDL 3 08/20/2017   Lab Results  Component Value Date   HGBA1C 5.8 08/20/2017         Assessment & Plan:   Problem List Items Addressed This Visit    Hypothyroidism    On Levothyroxine, continue to monitor      Relevant Orders   TSH (Completed)   Essential hypertension    Well controlled, no changes to meds. Encouraged heart healthy diet such as the DASH diet and exercise as tolerated.       Relevant Orders   CBC (Completed)   Comprehensive metabolic panel (Completed)   Hyperglycemia    hgba1c acceptable, minimize simple carbs. Increase exercise as tolerated.       Relevant Orders   Hemoglobin A1c (Completed)   Hyperlipidemia, mixed    Encouraged heart healthy diet, increase exercise, avoid trans fats, consider a krill oil cap daily      Relevant Orders   Lipid panel (Completed)   Anemia    Check  hemoccult cards and Increase leafy greens, consider increased lean red meat and using cast iron cookware. Continue to monitor, report any concerns         I am having Ms. Tursi maintain her Multiple Vitamins-Minerals (CENTRUM PO), Omega-3 Fatty Acids (FISH OIL PO), Probiotic Product (PROBIOTIC DAILY PO), furosemide, spironolactone, isosorbide mononitrate, carvedilol, hydrALAZINE, and levothyroxine.  No orders of the defined types were placed in this encounter.   CMA served as Education administrator during this visit. History, Physical and Plan performed by medical  provider. Documentation and orders reviewed and attested to.  Penni Homans, MD

## 2017-08-20 NOTE — Assessment & Plan Note (Signed)
hgba1c acceptable, minimize simple carbs. Increase exercise as tolerated.  

## 2017-08-21 ENCOUNTER — Encounter: Payer: Self-pay | Admitting: Family Medicine

## 2017-08-21 DIAGNOSIS — D649 Anemia, unspecified: Secondary | ICD-10-CM

## 2017-08-21 HISTORY — DX: Anemia, unspecified: D64.9

## 2017-08-21 NOTE — Assessment & Plan Note (Signed)
Check hemoccult cards and Increase leafy greens, consider increased lean red meat and using cast iron cookware. Continue to monitor, report any concerns

## 2017-08-23 MED ORDER — LEVOTHYROXINE SODIUM 25 MCG PO TABS: 25.0000 ug | ORAL_TABLET | Freq: Every day | ORAL | 1 refills | Status: DC

## 2017-08-23 MED FILL — LEVOTHYROXINE 25 MCG TABLET: 25 | 30 days supply | Qty: 30 | Fill #0

## 2017-08-23 NOTE — Addendum Note (Signed)
Addended by: Magdalene Molly A on: 08/23/2017 10:45 AM   Modules accepted: Orders

## 2017-08-27 MED FILL — hydrALAZINE HCL 10 MG TABS: 10 | 90 days supply | Qty: 270 | Fill #1

## 2017-09-08 ENCOUNTER — Other Ambulatory Visit (INDEPENDENT_AMBULATORY_CARE_PROVIDER_SITE_OTHER): Payer: Medicare Other

## 2017-09-08 ENCOUNTER — Other Ambulatory Visit: Payer: Self-pay

## 2017-09-08 DIAGNOSIS — D649 Anemia, unspecified: Secondary | ICD-10-CM

## 2017-09-08 LAB — FECAL OCCULT BLOOD, IMMUNOCHEMICAL: FECAL OCCULT BLD: NEGATIVE

## 2017-09-08 NOTE — Addendum Note (Signed)
Addended by: Harl Bowie on: 09/08/2017 04:30 PM   Modules accepted: Orders

## 2017-10-18 ENCOUNTER — Other Ambulatory Visit: Payer: Self-pay | Admitting: Family Medicine

## 2017-10-18 MED FILL — LEVOTHYROXINE 25 MCG TABLET: 25 | 30 days supply | Qty: 30 | Fill #1

## 2017-10-18 MED FILL — ISOSORBIDE MN ER 30 MG TAB: 30 | 90 days supply | Qty: 45 | Fill #0

## 2017-10-18 MED FILL — FUROSEMIDE 20 MG TAB: 20 | 90 days supply | Qty: 90 | Fill #0

## 2017-11-04 ENCOUNTER — Other Ambulatory Visit: Payer: Self-pay | Admitting: Family Medicine

## 2017-11-17 MED FILL — CARVEDILOL 6.25 MG TABLET: 6.25 | 90 days supply | Qty: 180 | Fill #2

## 2017-11-17 MED FILL — LEVOTHYROXINE 25 MCG TABLET: 25 | 30 days supply | Qty: 30 | Fill #0

## 2017-11-29 MED FILL — hydrALAZINE HCL 10 MG TABS: 10 | 90 days supply | Qty: 270 | Fill #2

## 2017-11-29 MED FILL — LEVOTHYROXINE 25 MCG TABLET: 25 | 30 days supply | Qty: 30 | Fill #1

## 2017-12-14 NOTE — Progress Notes (Signed)
HPI: FU nonischemic cardiomyopathy. Cardiac catheterization in 2010 showed an ejection fraction of 15-20% and normal coronary arteries. Echocardiogram repeated January 2017 and showed ejection fraction 20-25%. There was restrictive filling. There was moderate to severe mitral regurgitation, mild left atrial enlargement, mildly reduced RV function and moderately elevated pulmonary pressures. Patient admitted with syncope previously felt to potentially be vagally mediated. However given reduced LV function I discussed the possibility of of life vest or ICD. However she declined and preferred only medical therapy. The risk of sudden death was explained. Last echo 7/18 showed EF 40-45, grade 1 DD, mild MR. Since last seen, the patient denies any dyspnea on exertion, orthopnea, PND, pedal edema, palpitations, syncope or chest pain.    Current Outpatient Medications  Medication Sig Dispense Refill  . carvedilol (COREG) 6.25 MG tablet Take 1 tablet (6.25 mg total) by mouth 2 (two) times daily. 180 tablet 3  . furosemide (LASIX) 20 MG tablet TAKE 1 TABLET (20 MG TOTAL) BY MOUTH DAILY. 90 tablet 1  . hydrALAZINE (APRESOLINE) 10 MG tablet Take 1 tablet (10 mg total) by mouth 3 (three) times daily. 270 tablet 3  . isosorbide mononitrate (IMDUR) 30 MG 24 hr tablet Take 0.5 tablets (15 mg total) by mouth daily. 45 tablet 0  . levothyroxine (SYNTHROID, LEVOTHROID) 25 MCG tablet TAKE 1 TABLET (25 MCG TOTAL) BY MOUTH DAILY. 30 tablet 1  . Multiple Vitamins-Minerals (CENTRUM PO) Take 1 tablet by mouth daily.     . Omega-3 Fatty Acids (FISH OIL PO) Take 1 tablet by mouth daily.    . Probiotic Product (PROBIOTIC DAILY PO) Take by mouth.    . spironolactone (ALDACTONE) 25 MG tablet Take 1 tablet (25 mg total) by mouth daily. 30 tablet 4  . isosorbide mononitrate (IMDUR) 30 MG 24 hr tablet TAKE 0.5 TABLETS (15 MG TOTAL) BY MOUTH DAILY. 45 tablet 1   No current facility-administered medications for this visit.       Past Medical History:  Diagnosis Date  . Anemia 08/21/2017  . Anxiety   . Cardiomyopathy    non ischemic NL cos on cath 08/2009, EF to 15-20%, Her follow up  2D echo  in 10/2009 showed impoved EFo 35-40%  . CHF (congestive heart failure) ([place])   . History of lymphoma    Dr Martha Clan  . Hypercalcemia 04/20/2017  . Hypertension   . Hypothyroid   . Medicare annual wellness visit, subsequent 12/14/2014   Follows with Dr Marin Olp Follows with Dr Deatra Ina, gastroenterology, last colonoscopy in 2012, repeat in 2017 Last Pap 2011, always normal, no need for repeat Last MGM in 2011, no concerns, declines for now Follows with Dr Stanford Breed of cardiology   . Vitamin D deficiency 10/17/2015    Past Surgical History:  Procedure Laterality Date  . DILATION AND CURETTAGE OF UTERUS    . nasal polyps removed     age 14  . TONSILLECTOMY  1952    Social History   Socioeconomic History  . Marital status: Married    Spouse name: Not on file  . Number of children: Not on file  . Years of education: Not on file  . Highest education level: Not on file  Social Needs  . Financial resource strain: Not on file  . Food insecurity - worry: Not on file  . Food insecurity - inability: Not on file  . Transportation needs - medical: Not on file  . Transportation needs - non-medical: Not on file  Occupational History  . Not on file  Tobacco Use  . Smoking status: Never Smoker  . Smokeless tobacco: Never Used  . Tobacco comment: never used tobacco  Substance and Sexual Activity  . Alcohol use: No    Alcohol/week: 0.0 oz  . Drug use: No  . Sexual activity: Not on file    Comment: lives with husband, no dietary restrictions, minimizes dairy  Other Topics Concern  . Not on file  Social History Narrative   The patient is married ( husband Jakari Jacot)  She just recently celebrated     her 52nd anniversary.  She has five children.  There is no active     tobacco or alcohol use history.          Smoking  Status:  never   Caffeine use/day:  None   Does Patient Exercise:  yes    Family History  Problem Relation Age of Onset  . Diabetes Mother        deceased secondary to diabetes  . Hypertension Mother   . Vision loss Mother   . Pneumonia Father        died age 76 due to complications of pneumonia  . Colon polyps Father   . Liver disease Daughter   . Sarcoidosis Daughter   . Diabetes Sister   . Colon cancer Brother 26  . Cancer Brother   . Obesity Son   . Diabetes Brother   . Kidney disease Brother   . Diabetes Sister   . Sarcoidosis Sister   . Arthritis Sister   . Arthritis Sister   . Diabetes Sister   . Arthritis Sister   . Diabetes Sister   . Hypertension Daughter   . Hypertension Daughter   . Other Unknown        no obvious premature cardiovascular disease or familial cardiomyopathy is noted  . Alcohol abuse Neg Hx     ROS: no fevers or chills, productive cough, hemoptysis, dysphasia, odynophagia, melena, hematochezia, dysuria, hematuria, rash, seizure activity, orthopnea, PND, pedal edema, claudication. Remaining systems are negative.  Physical Exam: Well-developed well-nourished in no acute distress.  Skin is warm and dry.  HEENT is normal.  Neck is supple.  Chest is clear to auscultation with normal expansion.  Cardiovascular exam is regular rate and rhythm.  Abdominal exam nontender or distended. No masses palpated. Extremities show no edema. neuro grossly intact  ECG- personally reviewed  A/P  1 chronic systolic congestive heart failure-patient not volume overloaded on examination.  We will continue with present dose of diuretics.  Patient instructed on low-sodium diet and fluid restriction.  2 nonischemic cardiomyopathy-patient with history of angioedema with ACE inhibitor.  We will therefore continue with hydralazine/nitrates.  Continue carvedilol.  Patient refuses ICD.  3 hypertension-blood pressure is controlled.  Continue present medications.     4 Syncope-no recurrent episodes    [name], MD

## 2017-12-23 ENCOUNTER — Ambulatory Visit (INDEPENDENT_AMBULATORY_CARE_PROVIDER_SITE_OTHER): Payer: Medicare Other | Admitting: Cardiology

## 2017-12-23 ENCOUNTER — Encounter: Payer: Self-pay | Admitting: Cardiology

## 2017-12-23 VITALS — BP 122/74 | HR 86 | Ht 63.0 in | Wt 135.8 lb

## 2017-12-23 DIAGNOSIS — I428 Other cardiomyopathies: Secondary | ICD-10-CM | POA: Diagnosis not present

## 2017-12-23 DIAGNOSIS — I10 Essential (primary) hypertension: Secondary | ICD-10-CM

## 2017-12-23 DIAGNOSIS — I5022 Chronic systolic (congestive) heart failure: Secondary | ICD-10-CM

## 2017-12-23 NOTE — Patient Instructions (Signed)
Your physician wants you to follow-up in: ONE YEAR WITH DR CRENSHAW You will receive a reminder letter in the mail two months in advance. If you don't receive a letter, please call our office to schedule the follow-up appointment.   If you need a refill on your cardiac medications before your next appointment, please call your pharmacy.  

## 2018-01-03 ENCOUNTER — Other Ambulatory Visit: Payer: Self-pay | Admitting: Family Medicine

## 2018-01-17 MED FILL — FUROSEMIDE 20 MG TABS: 20 | 90 days supply | Qty: 90 | Fill #1

## 2018-01-17 MED FILL — LEVOTHYROXINE 25 MCG TABLET: 25 | 30 days supply | Qty: 30 | Fill #0

## 2018-01-17 MED FILL — ISOSORBIDE MN ER 30 MG TAB: 30 | 90 days supply | Qty: 45 | Fill #1

## 2018-01-20 ENCOUNTER — Inpatient Hospital Stay (HOSPITAL_BASED_OUTPATIENT_CLINIC_OR_DEPARTMENT_OTHER): Payer: Medicare Other | Admitting: Hematology & Oncology

## 2018-01-20 ENCOUNTER — Other Ambulatory Visit: Payer: Self-pay

## 2018-01-20 ENCOUNTER — Inpatient Hospital Stay: Payer: Medicare Other | Attending: Hematology & Oncology

## 2018-01-20 ENCOUNTER — Encounter: Payer: Self-pay | Admitting: Hematology & Oncology

## 2018-01-20 VITALS — BP 138/68 | HR 79 | Temp 98.3°F | Resp 16 | Wt 136.0 lb

## 2018-01-20 DIAGNOSIS — C8331 Diffuse large B-cell lymphoma, lymph nodes of head, face, and neck: Secondary | ICD-10-CM

## 2018-01-20 DIAGNOSIS — Z8572 Personal history of non-Hodgkin lymphomas: Secondary | ICD-10-CM | POA: Insufficient documentation

## 2018-01-20 DIAGNOSIS — Z79899 Other long term (current) drug therapy: Secondary | ICD-10-CM | POA: Diagnosis not present

## 2018-01-20 DIAGNOSIS — C8205 Follicular lymphoma grade I, lymph nodes of inguinal region and lower limb: Secondary | ICD-10-CM

## 2018-01-20 DIAGNOSIS — D72819 Decreased white blood cell count, unspecified: Secondary | ICD-10-CM | POA: Diagnosis not present

## 2018-01-20 LAB — CBC WITH DIFFERENTIAL (CANCER CENTER ONLY)
BASOS ABS: 0 10*3/uL (ref 0.0–0.1)
BASOS PCT: 0 %
EOS PCT: 1 %
Eosinophils Absolute: 0.1 10*3/uL (ref 0.0–0.5)
HCT: 36.5 % (ref 34.8–46.6)
Hemoglobin: 11.8 g/dL (ref 11.6–15.9)
Lymphocytes Relative: 28 %
Lymphs Abs: 1.4 10*3/uL (ref 0.9–3.3)
MCH: 29.8 pg (ref 26.0–34.0)
MCHC: 32.3 g/dL (ref 32.0–36.0)
MCV: 92.2 fL (ref 81.0–101.0)
MONO ABS: 0.6 10*3/uL (ref 0.1–0.9)
Monocytes Relative: 12 %
Neutro Abs: 3 10*3/uL (ref 1.5–6.5)
Neutrophils Relative %: 59 %
PLATELETS: 148 10*3/uL (ref 145–400)
RBC: 3.96 MIL/uL (ref 3.70–5.32)
RDW: 12.1 % (ref 11.1–15.7)
WBC Count: 5.1 10*3/uL (ref 3.9–10.0)

## 2018-01-20 LAB — CMP (CANCER CENTER ONLY)
ALBUMIN: 3.2 g/dL — AB (ref 3.5–5.0)
ALT: 21 U/L (ref 0–55)
ANION GAP: 7 (ref 3–11)
AST: 26 U/L (ref 5–34)
Alkaline Phosphatase: 114 U/L (ref 40–150)
BILIRUBIN TOTAL: 0.6 mg/dL (ref 0.2–1.2)
BUN: 21 mg/dL (ref 7–26)
CO2: 27 mmol/L (ref 22–29)
Calcium: 10.1 mg/dL (ref 8.4–10.4)
Chloride: 105 mmol/L (ref 98–109)
Creatinine: 1.38 mg/dL — ABNORMAL HIGH (ref 0.60–1.10)
GFR, Est AFR Am: 40 mL/min — ABNORMAL LOW (ref >60)
GFR, Estimated: 35 mL/min — ABNORMAL LOW (ref >60)
GLUCOSE: 109 mg/dL (ref 70–140)
Potassium: 4.5 mmol/L (ref 3.5–5.1)
Sodium: 139 mmol/L (ref 136–145)
TOTAL PROTEIN: 9.1 g/dL — AB (ref 6.4–8.3)

## 2018-01-20 LAB — LACTATE DEHYDROGENASE: LDH: 209 U/L (ref 125–245)

## 2018-01-20 NOTE — Progress Notes (Signed)
Hematology and Oncology Follow Up Visit  TAJAE MAIOLO [id number] [birth year] 84 y.o. 01/20/2018   Principle Diagnosis:  Diffuse large cell non-Hodgkin's lymphoma-remission  Current Therapy:   Observation    Interim History:  Ms. Shipman is here today for a follow-up. She is doing quite well. As always, she is involved with the [employer].  She is not sure how many events she will enter this year.  Last year, she one 12 metals.  She's had no issues with her heart. She's had no syncopal episodes. She's had no episodes of shortness of breath. She's had no congestive heart failure issues.   Her appetite is good and she is making sure to stay hydrated. Her weight is stable.   She's had no fever. She's had no influenza. She's had no problems with bowels or bladder.  She try to get off her Synthroid.  I told her that she cannot do this.  I told her that Synthroid is absolutely necessary for her.  I told her that if she gets off Synthroid, and she will end up back in the hospital.  Overall, her performance status is ECOG 1.  Medications:  Allergies as of 01/20/2018      Reactions   Lisinopril Swelling   Facial and lip swelling   Iohexol     Desc: hives,itiching      Medication List        Accurate as of 01/20/18 10:33 AM. Always use your most recent med list.          carvedilol 6.25 MG tablet Commonly known as:  COREG Take 1 tablet (6.25 mg total) by mouth 2 (two) times daily.   CENTRUM PO Take 1 tablet by mouth daily.   FISH OIL PO Take 1 tablet by mouth daily.   furosemide 20 MG tablet Commonly known as:  LASIX TAKE 1 TABLET (20 MG TOTAL) BY MOUTH DAILY.   hydrALAZINE 10 MG tablet Commonly known as:  APRESOLINE Take 1 tablet (10 mg total) by mouth 3 (three) times daily.   isosorbide mononitrate 30 MG 24 hr tablet Commonly known as:  IMDUR Take 0.5 tablets (15 mg total) by mouth daily.   levothyroxine 25 MCG tablet Commonly known as:  SYNTHROID,  LEVOTHROID TAKE 1 TABLET (25 MCG TOTAL) BY MOUTH DAILY.   PROBIOTIC DAILY PO Take by mouth.       Allergies:  Allergies  Allergen Reactions  . Lisinopril Swelling    Facial and lip swelling  . Iohexol      Desc: hives,itiching     Past Medical History, Surgical history, Social history, and Family History were reviewed and updated.  Review of Systems: All other 10 point review of systems is negative.   Physical Exam:  weight is 136 lb (61.7 kg). Her oral temperature is 98.3 F (36.8 C). Her blood pressure is 138/68 and her pulse is 79. Her respiration is 16 and oxygen saturation is 100%.   Wt Readings from Last 3 Encounters:  01/20/18 136 lb (61.7 kg)  12/23/17 135 lb 12.8 oz (61.6 kg)  08/20/17 135 lb (61.2 kg)    Elderly African-American female in no obvious distress. Head and neck exam shows no ocular or oral lesions. There is no adenopathy in her neck. Lungs are clear. She has no rales, wheezes or rhonchi. Cardiac exam regular rate and rhythm. She has an occasional extra beat. She has 1/6 systolic ejection murmur. Abdomen is soft. She has good bowel sounds. There is no  fluid wave. There is no palpable liver or spleen tip. Back exam shows no tenderness over the spine, ribs or hips. Extremities shows no clubbing, cyanosis or edema. She has good range of motion of her joints. She has good strength in her joints. Skin exam shows no rashes, ecchymoses or petechia. Back exam shows no kyphosis. Neurological exam shows no focal neurological deficits.   Lab Results  Component Value Date   WBC 5.1 01/20/2018   HGB 11.0 (L) 08/20/2017   HCT 36.5 01/20/2018   MCV 92.2 01/20/2018   PLT 148 01/20/2018   No results found for: FERRITIN, IRON, TIBC, UIBC, IRONPCTSAT Lab Results  Component Value Date   RETICCTPCT 1.1 10/15/2011   RBC 3.96 01/20/2018   RETICCTABS 49.7 10/15/2011   No results found for: KPAFRELGTCHN, LAMBDASER, KAPLAMBRATIO No results found for: IGGSERUM, IGA,  IGMSERUM No results found for: Odetta Pink, SPEI   Chemistry      Component Value Date/Time   NA 136 08/20/2017 1106   NA 138 01/21/2017 1021   K 4.7 08/20/2017 1106   K 5.1 01/21/2017 1021   CL 101 08/20/2017 1106   CO2 32 08/20/2017 1106   CO2 30 (H) 01/21/2017 1021   BUN 14 08/20/2017 1106   BUN 30.0 (H) 01/21/2017 1021   CREATININE 1.32 (H) 08/20/2017 1106   CREATININE 1.8 (H) 01/21/2017 1021      Component Value Date/Time   CALCIUM 10.0 08/20/2017 1106   CALCIUM 11.1 (H) 01/21/2017 1021   ALKPHOS 98 08/20/2017 1106   ALKPHOS 124 01/21/2017 1021   AST 21 08/20/2017 1106   AST 27 01/21/2017 1021   ALT 21 08/20/2017 1106   ALT 25 01/21/2017 1021   BILITOT 0.5 08/20/2017 1106   BILITOT 0.50 01/21/2017 1021     Impression and Plan: Ms. Bartel is a very pleasant 84 year old African American female with a history of diffuse large cell non-Hodgkin lymphoma. She is now 18 years out from therapy and so far there has been no evidence of recurrence. She is asymptomatic at this time.   I do not see need for any type of scan needs to be done right now.  She has chronic leukopenia. I suspect this probably is ethnic associated leukopenia. This should not cause any increased risk for infections.  As always, we will see her back in one year. She likes come back to see [place] and have fellowship.  Volanda Napoleon, MD 3/7/201910:33 AM

## 2018-02-09 ENCOUNTER — Other Ambulatory Visit: Payer: Self-pay | Admitting: *Deleted

## 2018-02-09 MED ORDER — ISOSORBIDE MONONITRATE ER 30 MG PO TB24: 15.0000 mg | ORAL_TABLET | Freq: Every day | ORAL | 3 refills | Status: DC

## 2018-02-21 ENCOUNTER — Encounter: Payer: Self-pay | Admitting: Family Medicine

## 2018-02-21 ENCOUNTER — Ambulatory Visit (INDEPENDENT_AMBULATORY_CARE_PROVIDER_SITE_OTHER): Payer: Medicare Other | Admitting: Family Medicine

## 2018-02-21 DIAGNOSIS — I428 Other cardiomyopathies: Secondary | ICD-10-CM

## 2018-02-21 DIAGNOSIS — D649 Anemia, unspecified: Secondary | ICD-10-CM

## 2018-02-21 DIAGNOSIS — D696 Thrombocytopenia, unspecified: Secondary | ICD-10-CM

## 2018-02-21 DIAGNOSIS — K589 Irritable bowel syndrome without diarrhea: Secondary | ICD-10-CM | POA: Insufficient documentation

## 2018-02-21 DIAGNOSIS — R739 Hyperglycemia, unspecified: Secondary | ICD-10-CM

## 2018-02-21 DIAGNOSIS — I10 Essential (primary) hypertension: Secondary | ICD-10-CM | POA: Diagnosis not present

## 2018-02-21 DIAGNOSIS — Z0000 Encounter for general adult medical examination without abnormal findings: Secondary | ICD-10-CM | POA: Diagnosis not present

## 2018-02-21 DIAGNOSIS — E039 Hypothyroidism, unspecified: Secondary | ICD-10-CM

## 2018-02-21 DIAGNOSIS — E782 Mixed hyperlipidemia: Secondary | ICD-10-CM | POA: Diagnosis not present

## 2018-02-21 DIAGNOSIS — E559 Vitamin D deficiency, unspecified: Secondary | ICD-10-CM

## 2018-02-21 LAB — CBC
HEMATOCRIT: 34 % — AB (ref 36.0–46.0)
HEMOGLOBIN: 11.1 g/dL — AB (ref 12.0–15.0)
MCHC: 32.8 g/dL (ref 30.0–36.0)
MCV: 89.9 fl (ref 78.0–100.0)
Platelets: 147 10*3/uL — ABNORMAL LOW (ref 150.0–400.0)
RBC: 3.78 Mil/uL — ABNORMAL LOW (ref 3.87–5.11)
RDW: 13 % (ref 11.5–15.5)
WBC: 4.3 10*3/uL (ref 4.0–10.5)

## 2018-02-21 LAB — LIPID PANEL
CHOLESTEROL: 152 mg/dL (ref 0–200)
HDL: 55.6 mg/dL (ref >39.00)
LDL CALC: 83 mg/dL (ref 0–99)
NONHDL: 96.06
Total CHOL/HDL Ratio: 3
Triglycerides: 64 mg/dL (ref 0.0–149.0)
VLDL: 12.8 mg/dL (ref 0.0–40.0)

## 2018-02-21 LAB — COMPREHENSIVE METABOLIC PANEL
ALK PHOS: 84 U/L (ref 39–117)
ALT: 29 U/L (ref 0–35)
AST: 26 U/L (ref 0–37)
Albumin: 3.2 g/dL — ABNORMAL LOW (ref 3.5–5.2)
BUN: 17 mg/dL (ref 6–23)
CHLORIDE: 98 meq/L (ref 96–112)
CO2: 35 mEq/L — ABNORMAL HIGH (ref 19–32)
Calcium: 9.5 mg/dL (ref 8.4–10.5)
Creatinine, Ser: 1.37 mg/dL — ABNORMAL HIGH (ref 0.40–1.20)
GFR: 47.45 mL/min — AB (ref >60.00)
GLUCOSE: 110 mg/dL — AB (ref 70–99)
POTASSIUM: 3.6 meq/L (ref 3.5–5.1)
SODIUM: 134 meq/L — AB (ref 135–145)
TOTAL PROTEIN: 8.2 g/dL (ref 6.0–8.3)
Total Bilirubin: 0.8 mg/dL (ref 0.2–1.2)

## 2018-02-21 LAB — HEMOGLOBIN A1C: Hgb A1c MFr Bld: 5.7 % (ref 4.6–6.5)

## 2018-02-21 LAB — TSH: TSH: 1.75 u[IU]/mL (ref 0.35–4.50)

## 2018-02-21 MED ORDER — CARVEDILOL 6.25 MG PO TABS: 6.2500 mg | ORAL_TABLET | Freq: Two times a day (BID) | ORAL | 3 refills | Status: DC

## 2018-02-21 MED ORDER — FUROSEMIDE 20 MG PO TABS: 20.0000 mg | ORAL_TABLET | Freq: Every day | ORAL | 1 refills | Status: DC | PRN

## 2018-02-21 MED FILL — FUROSEMIDE 20 MG TAB: 20 | 90 days supply | Qty: 90 | Fill #0

## 2018-02-21 MED FILL — CARVEDILOL 6.25 MG TAB: 6.25 | 90 days supply | Qty: 180 | Fill #3

## 2018-02-21 NOTE — Patient Instructions (Addendum)
Try taking a daily fiber supplement and eat a high fiber diet with plenty of fluid intake  Vitamin D 2000 IU daily Preventive Care 82 Years and Older, Female Preventive care refers to lifestyle choices and visits with your health care provider that can promote health and wellness. What does preventive care include?  A yearly physical exam. This is also called an annual well check.  Dental exams once or twice a year.  Routine eye exams. Ask your health care provider how often you should have your eyes checked.  Personal lifestyle choices, including: ? Daily care of your teeth and gums. ? Regular physical activity. ? Eating a healthy diet. ? Avoiding tobacco and drug use. ? Limiting alcohol use. ? Practicing safe sex. ? Taking low-dose aspirin every day. ? Taking vitamin and mineral supplements as recommended by your health care provider. What happens during an annual well check? The services and screenings done by your health care provider during your annual well check will depend on your age, overall health, lifestyle risk factors, and family history of disease. Counseling Your health care provider may ask you questions about your:  Alcohol use.  Tobacco use.  Drug use.  Emotional well-being.  Home and relationship well-being.  Sexual activity.  Eating habits.  History of falls.  Memory and ability to understand (cognition).  Work and work Statistician.  Reproductive health.  Screening You may have the following tests or measurements:  Height, weight, and BMI.  Blood pressure.  Lipid and cholesterol levels. These may be checked every 5 years, or more frequently if you are over 35 years old.  Skin check.  Lung cancer screening. You may have this screening every year starting at age 40 if you have a 30-pack-year history of smoking and currently smoke or have quit within the past 15 years.  Fecal occult blood test (FOBT) of the stool. You may have this test  every year starting at age 58.  Flexible sigmoidoscopy or colonoscopy. You may have a sigmoidoscopy every 5 years or a colonoscopy every 10 years starting at age 11.  Hepatitis C blood test.  Hepatitis B blood test.  Sexually transmitted disease (STD) testing.  Diabetes screening. This is done by checking your blood sugar (glucose) after you have not eaten for a while (fasting). You may have this done every 1-3 years.  Bone density scan. This is done to screen for osteoporosis. You may have this done starting at age 63.  Mammogram. This may be done every 1-2 years. Talk to your health care provider about how often you should have regular mammograms.  Talk with your health care provider about your test results, treatment options, and if necessary, the need for more tests. Vaccines Your health care provider may recommend certain vaccines, such as:  Influenza vaccine. This is recommended every year.  Tetanus, diphtheria, and acellular pertussis (Tdap, Td) vaccine. You may need a Td booster every 10 years.  Varicella vaccine. You may need this if you have not been vaccinated.  Zoster vaccine. You may need this after age 48.  Measles, mumps, and rubella (MMR) vaccine. You may need at least one dose of MMR if you were born in 1957 or later. You may also need a second dose.  Pneumococcal 13-valent conjugate (PCV13) vaccine. One dose is recommended after age 58.  Pneumococcal polysaccharide (PPSV23) vaccine. One dose is recommended after age 21.  Meningococcal vaccine. You may need this if you have certain conditions.  Hepatitis A vaccine. You  may need this if you have certain conditions or if you travel or work in places where you may be exposed to hepatitis A.  Hepatitis B vaccine. You may need this if you have certain conditions or if you travel or work in places where you may be exposed to hepatitis B.  Haemophilus influenzae type b (Hib) vaccine. You may need this if you have  certain conditions.  Talk to your health care provider about which screenings and vaccines you need and how often you need them. This information is not intended to replace advice given to you by your health care provider. Make sure you discuss any questions you have with your health care provider. Document Released: 11/29/2015 Document Revised: 07/22/2016 Document Reviewed: 09/03/2015 Elsevier Interactive Patient Education  2018 [place] increased hydration and fiber in diet. Daily probiotics. If bowels not moving can use MOM 2 tbls po in 4 oz of warm prune juice by mouth every 2-3 days. If no results then repeat in 4 hours with  Dulcolax suppository pr, may repeat again in 4 more hours as needed. Seek care if symptoms worsen. Consider daily Miralax and/or Dulcolax if symptoms persist.

## 2018-02-21 NOTE — Assessment & Plan Note (Signed)
Well controlled, no changes to meds. Encouraged heart healthy diet such as the DASH diet and exercise as tolerated.  °

## 2018-02-21 NOTE — Assessment & Plan Note (Signed)
hgba1c acceptable, minimize simple carbs. Increase exercise as tolerated.  

## 2018-02-21 NOTE — Assessment & Plan Note (Signed)
On Levothyroxine, continue to monitor 

## 2018-02-21 NOTE — Assessment & Plan Note (Signed)
Check labs today.

## 2018-02-21 NOTE — Assessment & Plan Note (Addendum)
Patient encouraged to maintain heart healthy diet, regular exercise, adequate sleep. Consider daily probiotics. Take medications as prescribed. Declines all immunizatioons. Declines further screening after discussion MGM, pap, colonoscopy included

## 2018-02-21 NOTE — Assessment & Plan Note (Signed)
Increase leafy greens, consider increased lean red meat and using cast iron cookware. Continue to monitor, report any concerns. Not taking iron

## 2018-02-21 NOTE — Assessment & Plan Note (Signed)
Asymptomatic, check CBC

## 2018-02-21 NOTE — Progress Notes (Signed)
Subjective:  I acted as a Education administrator for Dr. Charlett Blake. [name], [name]  Patient ID: Crystal [name], female    DOB: 04/30/1934, 84 y.o.   MRN: [id number]  No chief complaint on file.   HPI  Patient is in today for a follow up an d she is feeling well today.  She does note episodes when she feels stress of her abdomen bloating and discomfort especially when she feels stressed about her family or when she is out hiking. She had an episode of bloating and then nausea with 2 episodes of vomiting this past weekend but this is infrequent and she feels fine today. Her bowels are moving but at times she can have constipation. No bloody or tarry stool. No anorexia. And the above symptoms are infrequent. She continues to participate in the [employer] and stay active. Denies CP/palp/SOB/HA/congestion/fevers or GU c/o. Taking meds as prescribed  Patient Care Team: Mosie Lukes, MD as PCP - General (Family Medicine) Marin Olp Rudell Cobb, MD as Consulting Physician (Oncology) Stanford Breed Denice Bors, MD as Consulting Physician (Cardiology)   Past Medical History:  Diagnosis Date  . Anemia 08/21/2017  . Anxiety   . Cardiomyopathy    non ischemic NL cos on cath 08/2009, EF to 15-20%, Her follow up  2D echo  in 10/2009 showed impoved EFo 35-40%  . CHF (congestive heart failure) ([place])   . History of lymphoma    Dr Martha Clan  . Hypercalcemia 04/20/2017  . Hypertension   . Hypothyroid   . Medicare annual wellness visit, subsequent 12/14/2014   Follows with Dr Marin Olp Follows with Dr Deatra Ina, gastroenterology, last colonoscopy in 2012, repeat in 2017 Last Pap 2011, always normal, no need for repeat Last MGM in 2011, no concerns, declines for now Follows with Dr Stanford Breed of cardiology   . Vitamin D deficiency 10/17/2015    Past Surgical History:  Procedure Laterality Date  . DILATION AND CURETTAGE OF UTERUS    . nasal polyps removed     age 14  . TONSILLECTOMY  1952    Family History  Problem Relation Age of  Onset  . Diabetes Mother        deceased secondary to diabetes  . Hypertension Mother   . Vision loss Mother   . Pneumonia Father        died age 28 due to complications of pneumonia  . Colon polyps Father   . Liver disease Daughter   . Sarcoidosis Daughter   . Diabetes Sister   . Colon cancer Brother 61  . Cancer Brother   . Obesity Son   . Diabetes Brother   . Kidney disease Brother   . Diabetes Sister   . Sarcoidosis Sister   . Arthritis Sister   . Arthritis Sister   . Diabetes Sister   . Arthritis Sister   . Diabetes Sister   . Hypertension Daughter   . Hypertension Daughter   . Other Unknown        no obvious premature cardiovascular disease or familial cardiomyopathy is noted  . Alcohol abuse Neg Hx     Social History   Socioeconomic History  . Marital status: Married    Spouse name: Not on file  . Number of children: Not on file  . Years of education: Not on file  . Highest education level: Not on file  Occupational History  . Not on file  Social Needs  . Financial resource strain: Not on file  . Food insecurity:  Worry: Not on file    Inability: Not on file  . Transportation needs:    Medical: Not on file    Non-medical: Not on file  Tobacco Use  . Smoking status: Never Smoker  . Smokeless tobacco: Never Used  . Tobacco comment: never used tobacco  Substance and Sexual Activity  . Alcohol use: No    Alcohol/week: 0.0 oz  . Drug use: No  . Sexual activity: Not on file    Comment: lives with husband, no dietary restrictions, minimizes dairy  Lifestyle  . Physical activity:    Days per week: Not on file    Minutes per session: Not on file  . Stress: Not on file  Relationships  . Social connections:    Talks on phone: Not on file    Gets together: Not on file    Attends religious service: Not on file    Active member of club or organization: Not on file    Attends meetings of clubs or organizations: Not on file    Relationship status: Not on  file  . Intimate partner violence:    Fear of current or ex partner: Not on file    Emotionally abused: Not on file    Physically abused: Not on file    Forced sexual activity: Not on file  Other Topics Concern  . Not on file  Social History Narrative   The patient is married ( husband Desirey Keahey)  She just recently celebrated     her 52nd anniversary.  She has five children.  There is no active     tobacco or alcohol use history.          Smoking Status:  never   Caffeine use/day:  None   Does Patient Exercise:  yes    Outpatient Medications Prior to Visit  Medication Sig Dispense Refill  . hydrALAZINE (APRESOLINE) 10 MG tablet Take 1 tablet (10 mg total) by mouth 3 (three) times daily. 270 tablet 3  . isosorbide mononitrate (IMDUR) 30 MG 24 hr tablet Take 0.5 tablets (15 mg total) by mouth daily. 45 tablet 3  . levothyroxine (SYNTHROID, LEVOTHROID) 25 MCG tablet TAKE 1 TABLET (25 MCG TOTAL) BY MOUTH DAILY. 30 tablet 1  . Multiple Vitamins-Minerals (CENTRUM PO) Take 1 tablet by mouth daily.     . Omega-3 Fatty Acids (FISH OIL PO) Take 1 tablet by mouth daily.    . Probiotic Product (PROBIOTIC DAILY PO) Take by mouth.    . carvedilol (COREG) 6.25 MG tablet Take 1 tablet (6.25 mg total) by mouth 2 (two) times daily. 180 tablet 3  . furosemide (LASIX) 20 MG tablet TAKE 1 TABLET (20 MG TOTAL) BY MOUTH DAILY. 90 tablet 1   No facility-administered medications prior to visit.     Allergies  Allergen Reactions  . Lisinopril Swelling    Facial and lip swelling  . Iohexol      Desc: hives,itiching     Review of Systems  Constitutional: Negative for chills, fever and malaise/fatigue.  HENT: Negative for congestion and hearing loss.   Eyes: Negative for discharge.  Respiratory: Negative for cough, sputum production and shortness of breath.   Cardiovascular: Negative for chest pain, palpitations and leg swelling.  Gastrointestinal: Positive for abdominal pain, nausea and vomiting.  Negative for blood in stool, constipation, diarrhea and heartburn.  Genitourinary: Negative for dysuria, frequency, hematuria and urgency.  Musculoskeletal: Negative for back pain, falls and myalgias.  Skin: Negative for rash.  Neurological: Negative for dizziness, sensory change, loss of consciousness, weakness and headaches.  Endo/Heme/Allergies: Negative for environmental allergies. Does not bruise/bleed easily.  Psychiatric/Behavioral: Negative for depression and suicidal ideas. The patient is not nervous/anxious and does not have insomnia.        Objective:    Physical Exam  Constitutional: She is oriented to person, place, and time. She appears well-developed and well-nourished. No distress.  HENT:  Head: Normocephalic and atraumatic.  Eyes: Conjunctivae are normal.  Neck: Neck supple. No thyromegaly present.  Cardiovascular: Normal rate, regular rhythm and normal heart sounds.  No murmur heard. Pulmonary/Chest: Effort normal and breath sounds normal. No respiratory distress.  Abdominal: Soft. Bowel sounds are normal. She exhibits no distension and no mass. There is no tenderness.  Musculoskeletal: She exhibits no edema.  Lymphadenopathy:    She has no cervical adenopathy.  Neurological: She is alert and oriented to person, place, and time.  Skin: Skin is warm and dry.  Psychiatric: She has a normal mood and affect. Her behavior is normal.    BP 100/70 (BP Location: Left Arm, Patient Position: Sitting, Cuff Size: Normal)   Pulse 79   Temp 97.7 F (36.5 C) (Oral)   Resp 18   Wt 138 lb 6.4 oz (62.8 kg)   SpO2 99%   BMI 24.52 kg/m  Wt Readings from Last 3 Encounters:  02/21/18 138 lb 6.4 oz (62.8 kg)  01/20/18 136 lb (61.7 kg)  12/23/17 135 lb 12.8 oz (61.6 kg)   BP Readings from Last 3 Encounters:  02/21/18 100/70  01/20/18 138/68  12/23/17 122/74     There is no immunization history for the selected administration types on file for this patient.  Health  Maintenance  Topic Date Due  . TETANUS/TDAP  12/28/1954  . COLONOSCOPY  08/10/2016  . DEXA SCAN  04/28/2018 (Originally 12/28/2000)  . INFLUENZA VACCINE  Discontinued  . PNA vac Low Risk Adult  Discontinued    Lab Results  Component Value Date   WBC 5.1 01/20/2018   HGB 11.0 (L) 08/20/2017   HCT 36.5 01/20/2018   PLT 148 01/20/2018   GLUCOSE 109 01/20/2018   CHOL 167 08/20/2017   TRIG 97.0 08/20/2017   HDL 57.80 08/20/2017   LDLCALC 90 08/20/2017   ALT 21 01/20/2018   AST 26 01/20/2018   NA 139 01/20/2018   K 4.5 01/20/2018   CL 105 01/20/2018   CREATININE 1.38 (H) 01/20/2018   BUN 21 01/20/2018   CO2 27 01/20/2018   TSH 3.52 08/20/2017   INR 1.08 09/05/2009   HGBA1C 5.8 08/20/2017    Lab Results  Component Value Date   TSH 3.52 08/20/2017   Lab Results  Component Value Date   WBC 5.1 01/20/2018   HGB 11.0 (L) 08/20/2017   HCT 36.5 01/20/2018   MCV 92.2 01/20/2018   PLT 148 01/20/2018   Lab Results  Component Value Date   NA 139 01/20/2018   K 4.5 01/20/2018   CHLORIDE 101 01/21/2017   CO2 27 01/20/2018   GLUCOSE 109 01/20/2018   BUN 21 01/20/2018   CREATININE 1.38 (H) 01/20/2018   BILITOT 0.6 01/20/2018   ALKPHOS 114 01/20/2018   AST 26 01/20/2018   ALT 21 01/20/2018   PROT 9.1 (H) 01/20/2018   ALBUMIN 3.2 (L) 01/20/2018   CALCIUM 10.1 01/20/2018   ANIONGAP 7 01/20/2018   EGFR 29 (L) 01/21/2017   GFR 49.60 (L) 08/20/2017   Lab Results  Component Value Date  CHOL 167 08/20/2017   Lab Results  Component Value Date   HDL 57.80 08/20/2017   Lab Results  Component Value Date   LDLCALC 90 08/20/2017   Lab Results  Component Value Date   TRIG 97.0 08/20/2017   Lab Results  Component Value Date   CHOLHDL 3 08/20/2017   Lab Results  Component Value Date   HGBA1C 5.8 08/20/2017         Assessment & Plan:   Problem List Items Addressed This Visit    Hypothyroidism    On Levothyroxine, continue to monitor      Relevant  Medications   carvedilol (COREG) 6.25 MG tablet   Thrombocytopenia (HCC)    Asymptomatic, check CBC      Essential hypertension    Well controlled, no changes to meds. Encouraged heart healthy diet such as the DASH diet and exercise as tolerated.       Relevant Medications   carvedilol (COREG) 6.25 MG tablet   furosemide (LASIX) 20 MG tablet   Other Relevant Orders   CBC   Comprehensive metabolic panel   TSH   Hyperglycemia    hgba1c acceptable, minimize simple carbs. Increase exercise as tolerated.      Relevant Orders   Hemoglobin A1c   Preventative health care    Patient encouraged to maintain heart healthy diet, regular exercise, adequate sleep. Consider daily probiotics. Take medications as prescribed. Declines all immunizatioons. Declines further screening after discussion MGM, pap, colonoscopy included      Hyperlipidemia, mixed    Encouraged heart healthy diet, increase exercise, avoid trans fats, consider a krill oil cap daily      Relevant Medications   carvedilol (COREG) 6.25 MG tablet   furosemide (LASIX) 20 MG tablet   Other Relevant Orders   Lipid panel   Vitamin D deficiency    Check labs today      Anemia    Increase leafy greens, consider increased lean red meat and using cast iron cookware. Continue to monitor, report any concerns. Not taking iron      IBS (irritable bowel syndrome)    Encouraged daily fiber supplement and eat a high fiber diet with plenty of fluid intake       Other Visit Diagnoses    NICM (nonischemic cardiomyopathy) (HCC)       Relevant Medications   carvedilol (COREG) 6.25 MG tablet   furosemide (LASIX) 20 MG tablet      I have changed Crystal [name]'s furosemide. I am also having her maintain her Multiple Vitamins-Minerals (CENTRUM PO), Omega-3 Fatty Acids (FISH OIL PO), Probiotic Product (PROBIOTIC DAILY PO), hydrALAZINE, levothyroxine, isosorbide mononitrate, and carvedilol.  Meds ordered this encounter    Medications  . carvedilol (COREG) 6.25 MG tablet    Sig: Take 1 tablet (6.25 mg total) by mouth 2 (two) times daily.    Dispense:  180 tablet    Refill:  3  . furosemide (LASIX) 20 MG tablet    Sig: Take 1 tablet (20 mg total) by mouth daily as needed for fluid or edema (do not take if blood pressure is  low or any vomiting or diarrhea).    Dispense:  90 tablet    Refill:  1    CMA served as Education administrator during this visit. History, Physical and Plan performed by medical provider. Documentation and orders reviewed and attested to.  Penni Homans, MD

## 2018-02-21 NOTE — Assessment & Plan Note (Signed)
Encouraged heart healthy diet, increase exercise, avoid trans fats, consider a krill oil cap daily 

## 2018-02-21 NOTE — Assessment & Plan Note (Signed)
Encouraged daily fiber supplement and eat a high fiber diet with plenty of fluid intake

## 2018-02-22 DIAGNOSIS — K625 Hemorrhage of anus and rectum: Secondary | ICD-10-CM

## 2018-02-22 NOTE — Addendum Note (Signed)
Addended by: Wynonia Musty A on: 02/22/2018 09:43 AM   Modules accepted: Orders

## 2018-03-01 ENCOUNTER — Other Ambulatory Visit: Payer: Self-pay

## 2018-03-01 MED ORDER — ISOSORBIDE MONONITRATE ER 30 MG PO TB24: 15.0000 mg | ORAL_TABLET | Freq: Every day | ORAL | 3 refills | Status: DC

## 2018-03-01 MED FILL — hydrALAZINE HCL 10 MG TABS: 10 | 90 days supply | Qty: 270 | Fill #3

## 2018-03-09 ENCOUNTER — Other Ambulatory Visit: Payer: Self-pay

## 2018-03-09 MED ORDER — LEVOTHYROXINE SODIUM 25 MCG PO TABS: 25.0000 ug | ORAL_TABLET | Freq: Every day | ORAL | 1 refills | Status: DC

## 2018-03-11 ENCOUNTER — Other Ambulatory Visit (INDEPENDENT_AMBULATORY_CARE_PROVIDER_SITE_OTHER): Payer: Medicare Other

## 2018-03-11 ENCOUNTER — Telehealth: Payer: Self-pay | Admitting: Family

## 2018-03-11 DIAGNOSIS — R195 Other fecal abnormalities: Secondary | ICD-10-CM

## 2018-03-11 DIAGNOSIS — K625 Hemorrhage of anus and rectum: Secondary | ICD-10-CM | POA: Diagnosis not present

## 2018-03-11 LAB — FECAL OCCULT BLOOD, IMMUNOCHEMICAL: Fecal Occult Bld: POSITIVE — AB

## 2018-03-11 NOTE — Telephone Encounter (Signed)
Please contact patient and let her know that her stool test is positive for hidden blood.  This is likely contributing to her anemia.  If she is taking any anti-inflammatory such as ibuprofen, Aleve, aspirin-containing products, she should discontinue.  I will place an order for her to see GI for consultation.  Also if she develops bright red blood in the rectum or black tarry stools she should go to the emergency department.

## 2018-03-13 NOTE — Telephone Encounter (Signed)
thanks

## 2018-03-14 ENCOUNTER — Encounter: Payer: Self-pay | Admitting: Gastroenterology

## 2018-03-14 NOTE — Telephone Encounter (Signed)
Notified pt and she is agreeable to proceed with referral. She denies any NSAID usage. States someone called her this morning but she did not get the callback #. It appears that GI called her to schedule appt and I gave her the # to call them back.

## 2018-04-15 NOTE — Progress Notes (Signed)
Subjective:   Crystal [name] is a 84 y.o. female who presents for Medicare Annual (Subsequent) preventive examination.  Review of Systems: No ROS.  Medicare Wellness Visit. Additional risk factors are reflected in the social history. Cardiac Risk Factors include: dyslipidemia;hypertension;advanced age (>11men, >69 women) Sleep patterns: Sleeps 8-10 hrs. Home Safety/Smoke Alarms: Feels safe in home. Smoke alarms in place.  Living environment; residence and Firearm Safety: Lives with husband in 1 story home.    Female:         Mammo-  declines     Dexa scan-  declines     Objective:     Vitals: BP 110/70 (BP Location: Left Arm, Patient Position: Sitting, Cuff Size: Normal)   Pulse 82   Ht 5\' 3"  (1.6 m)   Wt 137 lb 3.2 oz (62.2 kg)   SpO2 98%   BMI 24.30 kg/m   Body mass index is 24.3 kg/m.  Advanced Directives 04/22/2018 04/20/2017 01/23/2016 01/19/2016 04/18/2015 12/14/2014  Does Patient Have a Medical Advance Directive? No No No No No Yes  Type of Advance Directive - - - - - Press photographer;Living will  Does patient want to make changes to medical advance directive? - Yes (MAU/Ambulatory/Procedural Areas - Information given) - - - -  Copy of [place] in Chart? - - - - - No - copy requested  Would patient like information on creating a medical advance directive? Yes (MAU/Ambulatory/Procedural Areas - Information given) - No - patient declined information No - patient declined information No - patient declined information -    Tobacco Social History   Tobacco Use  Smoking Status Never Smoker  Smokeless Tobacco Never Used  Tobacco Comment   never used tobacco     Counseling given: Not Answered Comment: never used tobacco   Clinical Intake: Pain : No/denies pain      Past Medical History:  Diagnosis Date  . Anemia 08/21/2017  . Anxiety   . Cancer ([place]) 11/17/1999   non hodgkins lymphoma-Dr. Marin Olp  . Cardiomyopathy    non ischemic NL  cos on cath 08/2009, EF to 15-20%, Her follow up  2D echo  in 10/2009 showed impoved EFo 35-40%  . CHF (congestive heart failure) ([place])   . History of lymphoma    Dr Martha Clan  . Hypercalcemia 04/20/2017  . Hypertension   . Hypothyroid   . Medicare annual wellness visit, subsequent 12/14/2014   Follows with Dr Marin Olp Follows with Dr Deatra Ina, gastroenterology, last colonoscopy in 2012, repeat in 2017 Last Pap 2011, always normal, no need for repeat Last MGM in 2011, no concerns, declines for now Follows with Dr Stanford Breed of cardiology   . Vitamin D deficiency 10/17/2015   Past Surgical History:  Procedure Laterality Date  . DILATION AND CURETTAGE OF UTERUS    . nasal polyps removed     age 58  . TONSILLECTOMY  1952   Family History  Problem Relation Age of Onset  . Diabetes Mother        deceased secondary to diabetes  . Hypertension Mother   . Vision loss Mother   . Pneumonia Father        died age 93 due to complications of pneumonia  . Colon polyps Father   . Liver disease Daughter   . Sarcoidosis Daughter   . Diabetes Sister   . Colon cancer Brother 58  . Cancer Brother   . Obesity Son   . Diabetes Brother   .  Kidney disease Brother   . Diabetes Sister   . Sarcoidosis Sister   . Arthritis Sister   . Arthritis Sister   . Diabetes Sister   . Arthritis Sister   . Diabetes Sister   . Hypertension Daughter   . Hypertension Daughter   . Other Unknown        no obvious premature cardiovascular disease or familial cardiomyopathy is noted  . Alcohol abuse Neg Hx    Social History   Socioeconomic History  . Marital status: Married    Spouse name: Not on file  . Number of children: Not on file  . Years of education: Not on file  . Highest education level: Not on file  Occupational History  . Not on file  Social Needs  . Financial resource strain: Not on file  . Food insecurity:    Worry: Not on file    Inability: Not on file  . Transportation needs:    Medical: Not  on file    Non-medical: Not on file  Tobacco Use  . Smoking status: Never Smoker  . Smokeless tobacco: Never Used  . Tobacco comment: never used tobacco  Substance and Sexual Activity  . Alcohol use: No    Alcohol/week: 0.0 oz  . Drug use: No  . Sexual activity: Not on file    Comment: lives with husband, no dietary restrictions, minimizes dairy  Lifestyle  . Physical activity:    Days per week: Not on file    Minutes per session: Not on file  . Stress: Not on file  Relationships  . Social connections:    Talks on phone: Not on file    Gets together: Not on file    Attends religious service: Not on file    Active member of club or organization: Not on file    Attends meetings of clubs or organizations: Not on file    Relationship status: Not on file  Other Topics Concern  . Not on file  Social History Narrative   The patient is married ( husband Golden Emile)  She just recently celebrated     her 52nd anniversary.  She has five children.  There is no active     tobacco or alcohol use history.          Smoking Status:  never   Caffeine use/day:  None   Does Patient Exercise:  yes    Outpatient Encounter Medications as of 04/22/2018  Medication Sig  . carvedilol (COREG) 6.25 MG tablet Take 1 tablet (6.25 mg total) by mouth 2 (two) times daily.  . furosemide (LASIX) 20 MG tablet Take 1 tablet (20 mg total) by mouth daily as needed for fluid or edema (do not take if blood pressure is  low or any vomiting or diarrhea).  . hydrALAZINE (APRESOLINE) 10 MG tablet Take 1 tablet (10 mg total) by mouth 3 (three) times daily.  . isosorbide mononitrate (IMDUR) 30 MG 24 hr tablet Take 0.5 tablets (15 mg total) by mouth daily.  Marland Kitchen levothyroxine (SYNTHROID, LEVOTHROID) 25 MCG tablet Take 1 tablet (25 mcg total) by mouth daily.  . Multiple Vitamins-Minerals (CENTRUM PO) Take 1 tablet by mouth daily.   . Omega-3 Fatty Acids (FISH OIL PO) Take 1 tablet by mouth daily.  . Probiotic Product  (PROBIOTIC DAILY PO) Take by mouth.   No facility-administered encounter medications on file as of 04/22/2018.     Activities of Daily Living In your present state of health, do  you have any difficulty performing the following activities: 04/22/2018  Hearing? N  Comment c/o tinnitnus. PCP aware per pt  Vision? N  Comment wears glasses for reading  Difficulty concentrating or making decisions? N  Walking or climbing stairs? N  Dressing or bathing? N  Doing errands, shopping? N  Preparing Food and eating ? N  Using the Toilet? N  In the past six months, have you accidently leaked urine? N  Do you have problems with loss of bowel control? N  Managing your Medications? N  Managing your Finances? N  Housekeeping or managing your Housekeeping? N  Some recent data might be hidden    Patient Care Team: Mosie Lukes, MD as PCP - General (Family Medicine) Marin Olp Rudell Cobb, MD as Consulting Physician (Oncology) Lelon Perla, MD as Consulting Physician (Cardiology)    Assessment:   This is a routine wellness examination for Delecia. Physical assessment deferred to PCP.  Exercise Activities and Dietary recommendations Current Exercise Habits: Home exercise routine, Type of exercise: walking, Time (Minutes): 60, Frequency (Times/Week): 1, Weekly Exercise (Minutes/Week): 60, Intensity: Moderate   Diet (meal preparation, eat out, water intake, caffeinated beverages, dairy products, fruits and vegetables): well balanced Breakfast:oatmeal Lunch: sandwich Dinner:  Noodles, vegetables Pt reports she drinks plenty of water.  Goals    . Maintain healthy active lifestyle       Fall Risk Fall Risk  04/22/2018 04/20/2017 10/20/2016 01/23/2016 07/18/2015  Falls in the past year? No No No No No    Depression Screen PHQ 2/9 Scores 04/22/2018 04/20/2017 10/20/2016 07/18/2015  PHQ - 2 Score 0 0 0 0     Cognitive Function MMSE - Mini Mental State Exam 04/22/2018  Not completed: Refused  Pt refused  MMSE. Pt alert and oriented. No concerns.      There is no immunization history for the selected administration types on file for this patient.  Screening Tests Health Maintenance  Topic Date Due  . TETANUS/TDAP  12/28/1952  . DEXA SCAN  04/28/2018 (Originally 12/28/2000)  . INFLUENZA VACCINE  Discontinued  . PNA vac Low Risk Adult  Discontinued    Plan:    Please schedule your next medicare wellness visit with me in 1 yr.  Continue to eat heart healthy diet (full of fruits, vegetables, whole grains, lean protein, water--limit salt, fat, and sugar intake) and increase physical activity as tolerated.  Continue doing brain stimulating activities (puzzles, reading, adult coloring books, staying active) to keep memory sharp.   I have personally reviewed and noted the following in the patient's chart:   . Medical and social history . Use of alcohol, tobacco or illicit drugs  . Current medications and supplements . Functional ability and status . Nutritional status . Physical activity . Advanced directives . List of other physicians . Hospitalizations, surgeries, and ER visits in previous 12 months . Vitals . Screenings to include cognitive, depression, and falls . Referrals and appointments  In addition, I have reviewed and discussed with patient certain preventive protocols, quality metrics, and best practice recommendations. A written personalized care plan for preventive services as well as general preventive health recommendations were provided to patient.     Shela Nevin, [place]  04/22/2018

## 2018-04-21 ENCOUNTER — Ambulatory Visit: Payer: Medicare Other | Admitting: *Deleted

## 2018-04-22 ENCOUNTER — Encounter: Payer: Self-pay | Admitting: *Deleted

## 2018-04-22 ENCOUNTER — Ambulatory Visit (INDEPENDENT_AMBULATORY_CARE_PROVIDER_SITE_OTHER): Payer: Medicare Other | Admitting: *Deleted

## 2018-04-22 VITALS — BP 110/70 | HR 82 | Ht 63.0 in | Wt 137.2 lb

## 2018-04-22 DIAGNOSIS — Z0000 Encounter for general adult medical examination without abnormal findings: Secondary | ICD-10-CM

## 2018-04-22 NOTE — Patient Instructions (Signed)
Please schedule your next medicare wellness visit with me in 1 yr.  Continue to eat heart healthy diet (full of fruits, vegetables, whole grains, lean protein, water--limit salt, fat, and sugar intake) and increase physical activity as tolerated.  Continue doing brain stimulating activities (puzzles, reading, adult coloring books, staying active) to keep memory sharp.    Crystal [name] , Thank you for taking time to come for your Medicare Wellness Visit. I appreciate your ongoing commitment to your health goals. Please review the following plan we discussed and let me know if I can assist you in the future.   These are the goals we discussed: Goals    . Maintain healthy active lifestyle       This is a list of the screening recommended for you and due dates:  Health Maintenance  Topic Date Due  . Tetanus Vaccine  [birth year]28/1952  . DEXA scan (bone density measurement)  04/28/2018*  . Flu Shot  Discontinued  . Pneumonia vaccines  Discontinued  *Topic was postponed. The date shown is not the original due date.    Health Maintenance for Postmenopausal Women Menopause is a normal process in which your reproductive ability comes to an end. This process happens gradually over a span of months to years, usually between the ages of 21 and 59. Menopause is complete when you have missed 12 consecutive menstrual periods. It is important to talk with your health care provider about some of the most common conditions that affect postmenopausal women, such as heart disease, cancer, and bone loss (osteoporosis). Adopting a healthy lifestyle and getting preventive care can help to promote your health and wellness. Those actions can also lower your chances of developing some of these common conditions. What should I know about menopause? During menopause, you may experience a number of symptoms, such as:  Moderate-to-severe hot flashes.  Night sweats.  Decrease in sex drive.  Mood  swings.  Headaches.  Tiredness.  Irritability.  Memory problems.  Insomnia.  Choosing to treat or not to treat menopausal changes is an individual decision that you make with your health care provider. What should I know about hormone replacement therapy and supplements? Hormone therapy products are effective for treating symptoms that are associated with menopause, such as hot flashes and night sweats. Hormone replacement carries certain risks, especially as you become older. If you are thinking about using estrogen or estrogen with progestin treatments, discuss the benefits and risks with your health care provider. What should I know about heart disease and stroke? Heart disease, heart attack, and stroke become more likely as you age. This may be due, in part, to the hormonal changes that your body experiences during menopause. These can affect how your body processes dietary fats, triglycerides, and cholesterol. Heart attack and stroke are both medical emergencies. There are many things that you can do to help prevent heart disease and stroke:  Have your blood pressure checked at least every 1-2 years. High blood pressure causes heart disease and increases the risk of stroke.  If you are 72-42 years old, ask your health care provider if you should take aspirin to prevent a heart attack or a stroke.  Do not use any tobacco products, including cigarettes, chewing tobacco, or electronic cigarettes. If you need help quitting, ask your health care provider.  It is important to eat a healthy diet and maintain a healthy weight. ? Be sure to include plenty of vegetables, fruits, low-fat dairy products, and lean protein. ? Avoid  eating foods that are high in solid fats, added sugars, or salt (sodium).  Get regular exercise. This is one of the most important things that you can do for your health. ? Try to exercise for at least 150 minutes each week. The type of exercise that you do should  increase your heart rate and make you sweat. This is known as moderate-intensity exercise. ? Try to do strengthening exercises at least twice each week. Do these in addition to the moderate-intensity exercise.  Know your numbers.Ask your health care provider to check your cholesterol and your blood glucose. Continue to have your blood tested as directed by your health care provider.  What should I know about cancer screening? There are several types of cancer. Take the following steps to reduce your risk and to catch any cancer development as early as possible. Breast Cancer  Practice breast self-awareness. ? This means understanding how your breasts normally appear and feel. ? It also means doing regular breast self-exams. Let your health care provider know about any changes, no matter how small.  If you are 20 or older, have a clinician do a breast exam (clinical breast exam or CBE) every year. Depending on your age, family history, and medical history, it may be recommended that you also have a yearly breast X-ray (mammogram).  If you have a family history of breast cancer, talk with your health care provider about genetic screening.  If you are at high risk for breast cancer, talk with your health care provider about having an MRI and a mammogram every year.  Breast cancer (BRCA) gene test is recommended for women who have family members with BRCA-related cancers. Results of the assessment will determine the need for genetic counseling and BRCA1 and for BRCA2 testing. BRCA-related cancers include these types: ? Breast. This occurs in males or females. ? Ovarian. ? Tubal. This may also be called fallopian tube cancer. ? Cancer of the abdominal or pelvic lining (peritoneal cancer). ? Prostate. ? Pancreatic.  Cervical, Uterine, and Ovarian Cancer Your health care provider may recommend that you be screened regularly for cancer of the pelvic organs. These include your ovaries, uterus,  and vagina. This screening involves a pelvic exam, which includes checking for microscopic changes to the surface of your cervix (Pap test).  For women ages 21-65, health care providers may recommend a pelvic exam and a Pap test every three years. For women ages 95-65, they may recommend the Pap test and pelvic exam, combined with testing for human papilloma virus (HPV), every five years. Some types of HPV increase your risk of cervical cancer. Testing for HPV may also be done on women of any age who have unclear Pap test results.  Other health care providers may not recommend any screening for nonpregnant women who are considered low risk for pelvic cancer and have no symptoms. Ask your health care provider if a screening pelvic exam is right for you.  If you have had past treatment for cervical cancer or a condition that could lead to cancer, you need Pap tests and screening for cancer for at least 20 years after your treatment. If Pap tests have been discontinued for you, your risk factors (such as having a new sexual partner) need to be reassessed to determine if you should start having screenings again. Some women have medical problems that increase the chance of getting cervical cancer. In these cases, your health care provider may recommend that you have screening and Pap tests  more often.  If you have a family history of uterine cancer or ovarian cancer, talk with your health care provider about genetic screening.  If you have vaginal bleeding after reaching menopause, tell your health care provider.  There are currently no reliable tests available to screen for ovarian cancer.  Lung Cancer Lung cancer screening is recommended for adults 37-69 years old who are at high risk for lung cancer because of a history of smoking. A yearly low-dose CT scan of the lungs is recommended if you:  Currently smoke.  Have a history of at least 30 pack-years of smoking and you currently smoke or have quit  within the past 15 years. A pack-year is smoking an average of one pack of cigarettes per day for one year.  Yearly screening should:  Continue until it has been 15 years since you quit.  Stop if you develop a health problem that would prevent you from having lung cancer treatment.  Colorectal Cancer  This type of cancer can be detected and can often be prevented.  Routine colorectal cancer screening usually begins at age 38 and continues through age 2.  If you have risk factors for colon cancer, your health care provider may recommend that you be screened at an earlier age.  If you have a family history of colorectal cancer, talk with your health care provider about genetic screening.  Your health care provider may also recommend using home test kits to check for hidden blood in your stool.  A small camera at the end of a tube can be used to examine your colon directly (sigmoidoscopy or colonoscopy). This is done to check for the earliest forms of colorectal cancer.  Direct examination of the colon should be repeated every 5-10 years until age 17. However, if early forms of precancerous polyps or small growths are found or if you have a family history or genetic risk for colorectal cancer, you may need to be screened more often.  Skin Cancer  Check your skin from head to toe regularly.  Monitor any moles. Be sure to tell your health care provider: ? About any new moles or changes in moles, especially if there is a change in a mole's shape or color. ? If you have a mole that is larger than the size of a pencil eraser.  If any of your family members has a history of skin cancer, especially at a young age, talk with your health care provider about genetic screening.  Always use sunscreen. Apply sunscreen liberally and repeatedly throughout the day.  Whenever you are outside, protect yourself by wearing long sleeves, pants, a wide-brimmed hat, and sunglasses.  What should I know  about osteoporosis? Osteoporosis is a condition in which bone destruction happens more quickly than new bone creation. After menopause, you may be at an increased risk for osteoporosis. To help prevent osteoporosis or the bone fractures that can happen because of osteoporosis, the following is recommended:  If you are 15-4 years old, get at least 1,000 mg of calcium and at least 600 mg of vitamin D per day.  If you are older than age 95 but younger than age 51, get at least 1,200 mg of calcium and at least 600 mg of vitamin D per day.  If you are older than age 57, get at least 1,200 mg of calcium and at least 800 mg of vitamin D per day.  Smoking and excessive alcohol intake increase the risk of osteoporosis. Eat foods  that are rich in calcium and vitamin D, and do weight-bearing exercises several times each week as directed by your health care provider. What should I know about how menopause affects my mental health? Depression may occur at any age, but it is more common as you become older. Common symptoms of depression include:  Low or sad mood.  Changes in sleep patterns.  Changes in appetite or eating patterns.  Feeling an overall lack of motivation or enjoyment of activities that you previously enjoyed.  Frequent crying spells.  Talk with your health care provider if you think that you are experiencing depression. What should I know about immunizations? It is important that you get and maintain your immunizations. These include:  Tetanus, diphtheria, and pertussis (Tdap) booster vaccine.  Influenza every year before the flu season begins.  Pneumonia vaccine.  Shingles vaccine.  Your health care provider may also recommend other immunizations. This information is not intended to replace advice given to you by your health care provider. Make sure you discuss any questions you have with your health care provider. Document Released: 12/25/2005 Document Revised: 05/22/2016  Document Reviewed: 08/06/2015 Elsevier Interactive Patient Education  2018 [employer].

## 2018-04-22 NOTE — Progress Notes (Signed)
Medical screening examination/treatment was performed by qualified clinical staff member and as supervising physician I was immediately available for consultation/collaboration. I have reviewed documentation and agree with assessment and plan.

## 2018-04-28 MED FILL — ISOSORBIDE MN ER 30 MG TAB: 30 | 90 days supply | Qty: 45 | Fill #0

## 2018-05-04 ENCOUNTER — Ambulatory Visit (INDEPENDENT_AMBULATORY_CARE_PROVIDER_SITE_OTHER): Payer: Medicare Other | Admitting: Gastroenterology

## 2018-05-04 ENCOUNTER — Encounter: Payer: Self-pay | Admitting: Gastroenterology

## 2018-05-04 VITALS — BP 132/70 | HR 74 | Ht 63.0 in | Wt 137.1 lb

## 2018-05-04 DIAGNOSIS — D509 Iron deficiency anemia, unspecified: Secondary | ICD-10-CM | POA: Diagnosis not present

## 2018-05-04 DIAGNOSIS — R195 Other fecal abnormalities: Secondary | ICD-10-CM

## 2018-05-04 NOTE — Progress Notes (Signed)
Crystal [name]    [id number]    01/17/1934  Primary Care Physician:Blyth, [name], MD  Referring Physician: Debbrah Alar, NP [address] [place], [place] [address]  Chief complaint:  Blood in stool, nausea, constipation  HPI: 84 year old female here for new patient visit for evaluation of heme positive stool on fecal Hemoccult testing on 03/11/2018. Hemoglobin 11.1, hematocrit 34.  Baseline hemoglobin 11-12. Patient is physically active, exercises routinely Hikes >3 miles Patient has no specific GI complaints. Denies any  vomiting, abdominal pain, melena or bright red blood per rectum No loss of appetite or weight loss Review of systems positive for mild intermittent nausea, usually resolves spontaneously. Chronic intermittent constipation, takes magnesium as needed usually once or twice a month.  Colonoscopy 08/11/2011 Transverse colon polyp was hyperplastic based on pathology report and diverticulosis.   Outpatient Encounter Medications as of 05/04/2018  Medication Sig  . carvedilol (COREG) 6.25 MG tablet Take 1 tablet (6.25 mg total) by mouth 2 (two) times daily.  . furosemide (LASIX) 20 MG tablet Take 1 tablet (20 mg total) by mouth daily as needed for fluid or edema (do not take if blood pressure is  low or any vomiting or diarrhea).  . hydrALAZINE (APRESOLINE) 10 MG tablet Take 1 tablet (10 mg total) by mouth 3 (three) times daily.  . isosorbide mononitrate (IMDUR) 30 MG 24 hr tablet Take 0.5 tablets (15 mg total) by mouth daily.  Marland Kitchen levothyroxine (SYNTHROID, LEVOTHROID) 25 MCG tablet Take 1 tablet (25 mcg total) by mouth daily.  . Multiple Vitamins-Minerals (CENTRUM PO) Take 1 tablet by mouth daily.   . Omega-3 Fatty Acids (FISH OIL PO) Take 1 tablet by mouth daily.  . Probiotic Product (PROBIOTIC DAILY PO) Take by mouth.   No facility-administered encounter medications on file as of 05/04/2018.     Allergies as of 05/04/2018 - Review  Complete 05/04/2018  Allergen Reaction Noted  . Lisinopril Swelling 06/11/2015  . Iohexol  08/21/2004    Past Medical History:  Diagnosis Date  . Anemia 08/21/2017  . Anxiety   . Cancer ([place]) 11/17/1999   non hodgkins lymphoma-Dr. Marin Olp  . Cardiomyopathy    non ischemic NL cos on cath 08/2009, EF to 15-20%, Her follow up  2D echo  in 10/2009 showed impoved EFo 35-40%  . CHF (congestive heart failure) ([place])   . History of lymphoma    Dr Martha Clan  . Hypercalcemia 04/20/2017  . Hypertension   . Hypothyroid   . Medicare annual wellness visit, subsequent 12/14/2014   Follows with Dr Marin Olp Follows with Dr Deatra Ina, gastroenterology, last colonoscopy in 2012, repeat in 2017 Last Pap 2011, always normal, no need for repeat Last MGM in 2011, no concerns, declines for now Follows with Dr Stanford Breed of cardiology   . Vitamin D deficiency 10/17/2015    Past Surgical History:  Procedure Laterality Date  . DILATION AND CURETTAGE OF UTERUS    . nasal polyps removed     age 82  . TONSILLECTOMY  1952    Family History  Problem Relation Age of Onset  . Diabetes Mother        deceased secondary to diabetes  . Hypertension Mother   . Vision loss Mother   . Pneumonia Father        died age 61 due to complications of pneumonia  . Colon polyps Father   . Liver disease Daughter   . Sarcoidosis  Daughter   . Diabetes Sister   . Colon cancer Brother 48  . Cancer Brother   . Obesity Son   . Diabetes Brother   . Kidney disease Brother   . Diabetes Sister   . Sarcoidosis Sister   . Arthritis Sister   . Arthritis Sister   . Diabetes Sister   . Arthritis Sister   . Diabetes Sister   . Hypertension Daughter   . Hypertension Daughter   . Other Unknown        no obvious premature cardiovascular disease or familial cardiomyopathy is noted  . Alcohol abuse Neg Hx     Social History   Socioeconomic History  . Marital status: Married    Spouse name: Not on file  . Number of children: Not on  file  . Years of education: Not on file  . Highest education level: Not on file  Occupational History  . Not on file  Social Needs  . Financial resource strain: Not on file  . Food insecurity:    Worry: Not on file    Inability: Not on file  . Transportation needs:    Medical: Not on file    Non-medical: Not on file  Tobacco Use  . Smoking status: Never Smoker  . Smokeless tobacco: Never Used  . Tobacco comment: never used tobacco  Substance and Sexual Activity  . Alcohol use: No    Alcohol/week: 0.0 oz  . Drug use: No  . Sexual activity: Not on file    Comment: lives with husband, no dietary restrictions, minimizes dairy  Lifestyle  . Physical activity:    Days per week: Not on file    Minutes per session: Not on file  . Stress: Not on file  Relationships  . Social connections:    Talks on phone: Not on file    Gets together: Not on file    Attends religious service: Not on file    Active member of club or organization: Not on file    Attends meetings of clubs or organizations: Not on file    Relationship status: Not on file  . Intimate partner violence:    Fear of current or ex partner: Not on file    Emotionally abused: Not on file    Physically abused: Not on file    Forced sexual activity: Not on file  Other Topics Concern  . Not on file  Social History Narrative   The patient is married ( husband Kristeena Meineke)  She just recently celebrated     her 52nd anniversary.  She has five children.  There is no active     tobacco or alcohol use history.          Smoking Status:  never   Caffeine use/day:  None   Does Patient Exercise:  yes      Review of systems: Review of Systems  Constitutional: Negative for fever and chills.  HENT: Negative.   Eyes: Negative for blurred vision.  Respiratory: Negative for cough, shortness of breath and wheezing.   Cardiovascular: Negative for chest pain and palpitations.  Gastrointestinal: as per HPI Genitourinary: Negative  for dysuria, urgency, frequency and hematuria.  Musculoskeletal: Negative for myalgias, back pain and joint pain.  Skin: Negative for itching and rash.  Neurological: Negative for dizziness, tremors, focal weakness, seizures and loss of consciousness.  Endo/Heme/Allergies: Positive for seasonal allergies.  Psychiatric/Behavioral: Negative for depression, suicidal ideas and hallucinations.  All other systems reviewed and are  negative.   Physical Exam: Vitals:   05/04/18 0908  BP: 132/70  Pulse: 74   Body mass index is 24.29 kg/m. Gen:      No acute distress HEENT:  EOMI, sclera anicteric Neck:     No masses; no thyromegaly Lungs:    Clear to auscultation bilaterally; normal respiratory effort CV:         Regular rate and rhythm; no murmurs Abd:      + bowel sounds; soft, non-tender; no palpable masses, no distension Ext:    No edema; adequate peripheral perfusion Skin:      Warm and dry; no rash Neuro: alert and oriented x 3 Psych: normal mood and affect  Data Reviewed:  Reviewed labs, radiology imaging, old records and pertinent past GI work up   Assessment and Plan/Recommendations:  84 year old female here for evaluation of Hemoccult positive stool and mild normocytic anemia. She has had 3 colonoscopies for colorectal cancer screening in the past, most recent in 2012.  No history of adenomatous colon polyps. Never had a EGD, will proceed with EGD for further evaluation The risks and benefits as well as alternatives of endoscopic procedure(s) have been discussed and reviewed. All questions answered. The patient agrees to proceed. We will consider capsule endoscopy for small bowel evaluation if EGD unremarkable  Nausea: Intermittent mild symptoms, resolves spontaneously. Continue to monitor F/u EGD to exclude GERD, gastritis or esophagitis  Constipation: increase dietary fiber and fluid intake.  Return as needed  K. Denzil Magnuson , MD [phone]    CC: Debbrah Alar, NP

## 2018-05-04 NOTE — Patient Instructions (Signed)
You have been scheduled for an endoscopy. Please follow written instructions given to you at your visit today. If you use inhalers (even only as needed), please bring them with you on the day of your procedure. Your physician has requested that you go to www.startemmi.com and enter the access code given to you at your visit today. This web site gives a general overview about your procedure. However, you should still follow specific instructions given to you by our office regarding your preparation for the procedure.  If you are age 29 or older, your body mass index should be between 23-30. Your Body mass index is 24.29 kg/m. If this is out of the aforementioned range listed, please consider follow up with your Primary Care Provider.  If you are age 74 or younger, your body mass index should be between 19-25. Your Body mass index is 24.29 kg/m. If this is out of the aformentioned range listed, please consider follow up with your Primary Care Provider.

## 2018-05-11 ENCOUNTER — Other Ambulatory Visit: Payer: Self-pay

## 2018-05-11 ENCOUNTER — Ambulatory Visit (AMBULATORY_SURGERY_CENTER): Payer: Medicare Other | Admitting: Gastroenterology

## 2018-05-11 ENCOUNTER — Encounter: Payer: Self-pay | Admitting: Gastroenterology

## 2018-05-11 ENCOUNTER — Other Ambulatory Visit: Payer: Self-pay | Admitting: Family Medicine

## 2018-05-11 VITALS — BP 130/49 | HR 68 | Temp 98.7°F | Resp 16 | Ht 63.0 in | Wt 137.0 lb

## 2018-05-11 DIAGNOSIS — R195 Other fecal abnormalities: Secondary | ICD-10-CM

## 2018-05-11 DIAGNOSIS — R52 Pain, unspecified: Secondary | ICD-10-CM | POA: Diagnosis not present

## 2018-05-11 DIAGNOSIS — K2971 Gastritis, unspecified, with bleeding: Secondary | ICD-10-CM | POA: Diagnosis not present

## 2018-05-11 DIAGNOSIS — I10 Essential (primary) hypertension: Secondary | ICD-10-CM | POA: Diagnosis not present

## 2018-05-11 DIAGNOSIS — I429 Cardiomyopathy, unspecified: Secondary | ICD-10-CM | POA: Diagnosis not present

## 2018-05-11 DIAGNOSIS — K3189 Other diseases of stomach and duodenum: Secondary | ICD-10-CM | POA: Diagnosis not present

## 2018-05-11 MED ORDER — SODIUM CHLORIDE 0.9 % IV SOLN: 500.0000 mL | Freq: Once | INTRAVENOUS | Status: DC

## 2018-05-11 NOTE — Progress Notes (Signed)
Report to PACU, RN, vss, BBS= Clear.  

## 2018-05-11 NOTE — Op Note (Signed)
[facility] Patient Name: Crystal [name] Procedure Date: 05/11/2018 9:20 AM MRN: [id number] Endoscopist: Mauri Pole , MD Age: 84 Referring MD:  Date of Birth: [birth year] Gender: Female Account #: [id number] Procedure:                Upper GI endoscopy Indications:              Gastrointestinal bleeding of unknown origin. Heme                            positive stool. Medicines:                Monitored Anesthesia Care Procedure:                Pre-Anesthesia Assessment:                           - Prior to the procedure, a History and Physical                            was performed, and patient medications and                            allergies were reviewed. The patient's tolerance of                            previous anesthesia was also reviewed. The risks                            and benefits of the procedure and the sedation                            options and risks were discussed with the patient.                            All questions were answered, and informed consent                            was obtained. Prior Anticoagulants: The patient has                            taken no previous anticoagulant or antiplatelet                            agents. ASA Grade Assessment: III - A patient with                            severe systemic disease. After reviewing the risks                            and benefits, the patient was deemed in                            satisfactory condition to undergo the procedure.  After obtaining informed consent, the endoscope was                            passed under direct vision. Throughout the                            procedure, the patient's blood pressure, pulse, and                            oxygen saturations were monitored continuously. The                            Endoscope was introduced through the mouth, and                            advanced to the second part of  duodenum. The upper                            GI endoscopy was accomplished without difficulty.                            The patient tolerated the procedure well. Scope In: Scope Out: Findings:                 The Z-line was regular and was found 36 cm from the                            incisors.                           No gross lesions were noted in the entire esophagus.                           Patchy mild inflammation with hemorrhage                            characterized by adherent blood, erosions and                            erythema was found in the entire examined stomach.                            Biopsies were taken with a cold forceps for                            Helicobacter pylori testing using CLOtest.                           Patchy mildly erythematous mucosa and with no                            stigmata of bleeding was found in the duodenal  bulb, in the first portion of the duodenum and in                            the second portion of the duodenum. Biopsies for                            histology were taken with a cold forceps for                            evaluation of celiac disease. Complications:            No immediate complications. Estimated Blood Loss:     Estimated blood loss was minimal. Impression:               - Z-line regular, 36 cm from the incisors.                           - No gross lesions in esophagus.                           - Gastritis with hemorrhage. Biopsied.                           - Erythematous duodenopathy. Biopsied. Recommendation:           - Patient has a contact number available for                            emergencies. The signs and symptoms of potential                            delayed complications were discussed with the                            patient. Return to normal activities tomorrow.                            Written discharge instructions were provided to the                             patient.                           - Resume previous diet.                           - Continue present medications.                           - Await pathology results.                           - No high dose aspirin, ibuprofen, naproxen, or                            other non-steroidal anti-inflammatory drugs. Mauri Pole, MD 05/11/2018 9:43:29 AM This report  has been signed electronically.

## 2018-05-11 NOTE — Progress Notes (Signed)
Called to room to assist during endoscopic procedure.  Patient ID and intended procedure confirmed with present staff. Received instructions for my participation in the procedure from the performing physician.  

## 2018-05-11 NOTE — Patient Instructions (Signed)
YOU HAD AN ENDOSCOPIC PROCEDURE TODAY AT [facility]:   Refer to the procedure report that was given to you for any specific questions about what was found during the examination.  If the procedure report does not answer your questions, please call your gastroenterologist to clarify.  If you requested that your care partner not be given the details of your procedure findings, then the procedure report has been included in a sealed envelope for you to review at your convenience later.  YOU SHOULD EXPECT: Some feelings of bloating in the abdomen. Passage of more gas than usual.  Walking can help get rid of the air that was put into your GI tract during the procedure and reduce the bloating. If you had a lower endoscopy (such as a colonoscopy or flexible sigmoidoscopy) you may notice spotting of blood in your stool or on the toilet paper. If you underwent a bowel prep for your procedure, you may not have a normal bowel movement for a few days.  Please Note:  You might notice some irritation and congestion in your nose or some drainage.  This is from the oxygen used during your procedure.  There is no need for concern and it should clear up in a day or so.  SYMPTOMS TO REPORT IMMEDIATELY:    Following upper endoscopy (EGD)  Vomiting of blood or coffee ground material  New chest pain or pain under the shoulder blades  Painful or persistently difficult swallowing  New shortness of breath  Fever of 100F or higher  Black, tarry-looking stools  For urgent or emergent issues, a gastroenterologist can be reached at any hour by calling [phone].   DIET:  We do recommend a small meal at first, but then you may proceed to your regular diet.  Drink plenty of fluids but you should avoid alcoholic beverages for 24 hours.  ACTIVITY:  You should plan to take it easy for the rest of today and you should NOT DRIVE or use heavy machinery until tomorrow (because of the sedation medicines used  during the test).    FOLLOW UP: Our staff will call the number listed on your records the next business day following your procedure to check on you and address any questions or concerns that you may have regarding the information given to you following your procedure. If we do not reach you, we will leave a message.  However, if you are feeling well and you are not experiencing any problems, there is no need to return our call.  We will assume that you have returned to your regular daily activities without incident.  If any biopsies were taken you will be contacted by phone or by letter within the next 1-3 weeks.  Please call us at [phone] if you have not heard about the biopsies in 3 weeks.   Await for biopsy results .Marland KitchenNo ibuprofen, naproxen or other non-steroidal anti-inflammatory drugs  SIGNATURES/CONFIDENTIALITY: You and/or your care partner have signed paperwork which will be entered into your electronic medical record.  These signatures attest to the fact that that the information above on your After Visit Summary has been reviewed and is understood.  Full responsibility of the confidentiality of this discharge information lies with you and/or your care-partner.

## 2018-05-12 ENCOUNTER — Telehealth: Payer: Self-pay | Admitting: *Deleted

## 2018-05-12 ENCOUNTER — Telehealth: Payer: Self-pay

## 2018-05-12 LAB — HELICOBACTER PYLORI SCREEN-BIOPSY: UREASE: NEGATIVE

## 2018-05-12 NOTE — Telephone Encounter (Signed)
  Follow up Call-  Call back number 05/11/2018  Post procedure Call Back phone  # [phone]  Permission to leave phone message Yes  Some recent data might be hidden     Patient questions:  Do you have a fever, pain , or abdominal swelling? No. Pain Score  0 *  Have you tolerated food without any problems? Yes.    Have you been able to return to your normal activities? Yes.    Do you have any questions about your discharge instructions: Diet   No. Medications  No. Follow up visit  No.  Do you have questions or concerns about your Care? No.  Actions: * If pain score is 4 or above: No action needed, pain <4.

## 2018-05-12 NOTE — Telephone Encounter (Signed)
No answer for post procedure call back, left message. Will attempt to call back.

## 2018-05-13 ENCOUNTER — Other Ambulatory Visit: Payer: Self-pay | Admitting: Family Medicine

## 2018-05-17 ENCOUNTER — Encounter: Payer: Self-pay | Admitting: Gastroenterology

## 2018-05-23 MED FILL — CARVEDILOL 6.25 MG TAB: 6.25 | 90 days supply | Qty: 180 | Fill #0

## 2018-05-24 ENCOUNTER — Emergency Department (HOSPITAL_BASED_OUTPATIENT_CLINIC_OR_DEPARTMENT_OTHER): Payer: Medicare Other

## 2018-05-24 ENCOUNTER — Encounter (HOSPITAL_BASED_OUTPATIENT_CLINIC_OR_DEPARTMENT_OTHER): Payer: Self-pay

## 2018-05-24 ENCOUNTER — Emergency Department (HOSPITAL_BASED_OUTPATIENT_CLINIC_OR_DEPARTMENT_OTHER)
Admission: EM | Admit: 2018-05-24 | Discharge: 2018-05-24 | Disposition: A | Discharge status: Home / Self Care | Payer: Medicare Other | Attending: Emergency Medicine | Admitting: Emergency Medicine

## 2018-05-24 DIAGNOSIS — R918 Other nonspecific abnormal finding of lung field: Secondary | ICD-10-CM | POA: Diagnosis not present

## 2018-05-24 DIAGNOSIS — J984 Other disorders of lung: Secondary | ICD-10-CM | POA: Diagnosis not present

## 2018-05-24 DIAGNOSIS — M62838 Other muscle spasm: Secondary | ICD-10-CM | POA: Diagnosis not present

## 2018-05-24 DIAGNOSIS — E039 Hypothyroidism, unspecified: Secondary | ICD-10-CM | POA: Diagnosis not present

## 2018-05-24 DIAGNOSIS — I110 Hypertensive heart disease with heart failure: Secondary | ICD-10-CM | POA: Insufficient documentation

## 2018-05-24 DIAGNOSIS — M542 Cervicalgia: Secondary | ICD-10-CM

## 2018-05-24 DIAGNOSIS — I5022 Chronic systolic (congestive) heart failure: Secondary | ICD-10-CM | POA: Diagnosis not present

## 2018-05-24 DIAGNOSIS — K5730 Diverticulosis of large intestine without perforation or abscess without bleeding: Secondary | ICD-10-CM | POA: Diagnosis not present

## 2018-05-24 DIAGNOSIS — Z79899 Other long term (current) drug therapy: Secondary | ICD-10-CM | POA: Diagnosis not present

## 2018-05-24 LAB — CBC WITH DIFFERENTIAL/PLATELET
BASOS PCT: 0 %
Basophils Absolute: 0 10*3/uL (ref 0.0–0.1)
EOS ABS: 0 10*3/uL (ref 0.0–0.7)
Eosinophils Relative: 1 %
HEMATOCRIT: 38.3 % (ref 36.0–46.0)
HEMOGLOBIN: 12.8 g/dL (ref 12.0–15.0)
Lymphocytes Relative: 28 %
Lymphs Abs: 1.2 10*3/uL (ref 0.7–4.0)
MCH: 29.7 pg (ref 26.0–34.0)
MCHC: 33.4 g/dL (ref 30.0–36.0)
MCV: 88.9 fL (ref 78.0–100.0)
MONO ABS: 0.5 10*3/uL (ref 0.1–1.0)
MONOS PCT: 12 %
NEUTROS ABS: 2.4 10*3/uL (ref 1.7–7.7)
Neutrophils Relative %: 59 %
Platelets: 134 10*3/uL — ABNORMAL LOW (ref 150–400)
RBC: 4.31 MIL/uL (ref 3.87–5.11)
RDW: 12.5 % (ref 11.5–15.5)
WBC: 4.1 10*3/uL (ref 4.0–10.5)

## 2018-05-24 LAB — BASIC METABOLIC PANEL
Anion gap: 8 (ref 5–15)
BUN: 17 mg/dL (ref 8–23)
CALCIUM: 9.5 mg/dL (ref 8.9–10.3)
CHLORIDE: 103 mmol/L (ref 98–111)
CO2: 26 mmol/L (ref 22–32)
CREATININE: 1.28 mg/dL — AB (ref 0.44–1.00)
GFR calc non Af Amer: 38 mL/min — ABNORMAL LOW (ref >60)
GFR, EST AFRICAN AMERICAN: 44 mL/min — AB (ref >60)
Glucose, Bld: 102 mg/dL — ABNORMAL HIGH (ref 70–99)
Potassium: 4.3 mmol/L (ref 3.5–5.1)
Sodium: 137 mmol/L (ref 135–145)

## 2018-05-24 MED ORDER — ACETAMINOPHEN 325 MG PO TABS
650.0000 mg | ORAL_TABLET | Freq: Once | ORAL | Status: AC
  Administered 2018-05-24: 650 mg via ORAL
  Filled 2018-05-24: qty 2

## 2018-05-24 MED ORDER — MORPHINE SULFATE (PF) 4 MG/ML IV SOLN
4.0000 mg | Freq: Once | INTRAVENOUS | Status: AC
  Administered 2018-05-24: 4 mg via INTRAVENOUS
  Filled 2018-05-24: qty 1

## 2018-05-24 MED ORDER — KETOROLAC TROMETHAMINE 30 MG/ML IJ SOLN
30.0000 mg | Freq: Once | INTRAMUSCULAR | Status: AC
  Administered 2018-05-24: 30 mg via INTRAVENOUS
  Filled 2018-05-24: qty 1

## 2018-05-24 MED ORDER — DIAZEPAM 2 MG PO TABS: 5.0000 mg | ORAL_TABLET | Freq: Three times a day (TID) | ORAL | 0 refills | Status: DC | PRN

## 2018-05-24 MED ORDER — DIAZEPAM 2 MG PO TABS
2.0000 mg | ORAL_TABLET | Freq: Once | ORAL | Status: AC
  Administered 2018-05-24: 2 mg via ORAL
  Filled 2018-05-24: qty 1

## 2018-05-24 MED ORDER — HYDROCODONE-ACETAMINOPHEN 5-325 MG PO TABS: 1.0000 | ORAL_TABLET | Freq: Four times a day (QID) | ORAL | 0 refills | Status: DC | PRN

## 2018-05-24 MED FILL — HYDROCODON-APAP 5-325: 5-325 | 2 days supply | Qty: 8 | Fill #0

## 2018-05-24 MED FILL — diazePAM 2 MG TABS: 2 | 2 days supply | Qty: 15 | Fill #0

## 2018-05-24 NOTE — ED Notes (Signed)
Hot packs given for comfort

## 2018-05-24 NOTE — ED Provider Notes (Signed)
[facility] EMERGENCY DEPARTMENT Provider Note   CSN: [id number] Arrival date & time: 05/24/18  1018     History   Chief Complaint Chief Complaint  Patient presents with  . Neck Pain    HPI Crystal [name] is a 84 y.o. female.  HPI Patient is an 84 year old female presents the emergency department severe neck pain.  This began yesterday.  She states is painful to move her head to the left and right.  She presents with what appears to be spasm of the left paraspinal/paracervical muscles.  No weakness of her arms or legs.  No recent injury or trauma.  She does report a history of lymphoma with lymphoma in her neck before.  She is concerned that this could be recurrence.  She is tried heat without improvement in her symptoms.    Past Medical History:  Diagnosis Date  . Anemia 08/21/2017  . Anxiety   . Cancer ([place]) 11/17/1999   non hodgkins lymphoma-Dr. Marin Olp  . Cardiomyopathy    non ischemic NL cos on cath 08/2009, EF to 15-20%, Her follow up  2D echo  in 10/2009 showed impoved EFo 35-40%  . CHF (congestive heart failure) ([place])   . History of lymphoma    Dr Martha Clan  . Hypercalcemia 04/20/2017  . Hypertension   . Hypothyroid   . Medicare annual wellness visit, subsequent 12/14/2014   Follows with Dr Marin Olp Follows with Dr Deatra Ina, gastroenterology, last colonoscopy in 2012, repeat in 2017 Last Pap 2011, always normal, no need for repeat Last MGM in 2011, no concerns, declines for now Follows with Dr Stanford Breed of cardiology   . Vitamin D deficiency 10/17/2015    Patient Active Problem List   Diagnosis Date Noted  . IBS (irritable bowel syndrome) 02/21/2018  . Anemia 08/21/2017  . Hypercalcemia 04/20/2017  . Diffuse large B-cell lymphoma of lymph nodes of neck ([place]) 01/21/2017  . Mitral valve regurgitation, Moderate to severe 11/26/2015  . Chronic systolic heart failure ([place]) 11/26/2015  . Hyperlipidemia, mixed 10/17/2015  . Vitamin D deficiency 10/17/2015  .  History of angioedema 06/11/2015  . Hematuria 06/11/2015  . Preventative health care 12/14/2014  . Hyperglycemia 03/24/2012  . Diverticulosis 10/01/2011  . Thrombocytopenia ([place]) 10/02/2010  . Hypothyroidism 01/24/2010  . RHEUMATOID FACTOR, POSITIVE 01/24/2010  . Non Hodgkin's lymphoma ([place]) 01/09/2010  . RAYNAUDS SYNDROME 01/09/2010  . ANXIETY 09/20/2009  . Essential hypertension 09/20/2009  . Nonischemic cardiomyopathy ([place]) 09/20/2009  . Congestive heart failure ([place]) 09/20/2009    Past Surgical History:  Procedure Laterality Date  . DILATION AND CURETTAGE OF UTERUS    . nasal polyps removed     age 68  . TONSILLECTOMY  1952     OB History   None      Home Medications    Prior to Admission medications   Medication Sig Start Date End Date Taking? Authorizing Provider  carvedilol (COREG) 6.25 MG tablet Take 1 tablet (6.25 mg total) by mouth 2 (two) times daily. 02/21/18   Mosie Lukes, MD  furosemide (LASIX) 20 MG tablet Take 1 tablet (20 mg total) by mouth daily as needed for fluid or edema (do not take if blood pressure is  low or any vomiting or diarrhea). 02/21/18   Mosie Lukes, MD  hydrALAZINE (APRESOLINE) 10 MG tablet Take 1 tablet (10 mg total) by mouth 3 (three) times daily. 06/03/17   Lelon Perla, MD  isosorbide mononitrate (IMDUR) 30 MG 24 hr tablet Take 0.5  tablets (15 mg total) by mouth daily. 03/01/18   Lelon Perla, MD  levothyroxine (SYNTHROID, LEVOTHROID) 25 MCG tablet TAKE 1 TABLET(25 MCG) BY MOUTH DAILY 05/11/18   Mosie Lukes, MD  Multiple Vitamins-Minerals (CENTRUM PO) Take 1 tablet by mouth daily.     [provider]  Omega-3 Fatty Acids (FISH OIL PO) Take 1 tablet by mouth daily.    [provider]  Probiotic Product (PROBIOTIC DAILY PO) Take by mouth.    [provider]    Family History Family History  Problem Relation Age of Onset  . Diabetes Mother        deceased secondary to diabetes  . Hypertension  Mother   . Vision loss Mother   . Pneumonia Father        died age 58 due to complications of pneumonia  . Colon polyps Father   . Liver disease Daughter   . Sarcoidosis Daughter   . Diabetes Sister   . Colon cancer Brother 51  . Cancer Brother   . Obesity Son   . Diabetes Brother   . Kidney disease Brother   . Diabetes Sister   . Sarcoidosis Sister   . Arthritis Sister   . Arthritis Sister   . Diabetes Sister   . Arthritis Sister   . Diabetes Sister   . Hypertension Daughter   . Hypertension Daughter   . Other Unknown        no obvious premature cardiovascular disease or familial cardiomyopathy is noted  . Alcohol abuse Neg Hx   . Heart disease Neg Hx     Social History Social History   Tobacco Use  . Smoking status: Never Smoker  . Smokeless tobacco: Never Used  . Tobacco comment: never used tobacco  Substance Use Topics  . Alcohol use: No    Alcohol/week: 0.0 oz  . Drug use: No     Allergies   Lisinopril and Iohexol   Review of Systems Review of Systems  All other systems reviewed and are negative.    Physical Exam Updated Vital Signs BP 128/78 (BP Location: Left Arm)   Pulse 63   Temp 97.9 F (36.6 C) (Oral)   Resp 17   Ht 5\' 3"  (1.6 m)   Wt 62.1 kg (137 lb)   SpO2 99%   BMI 24.27 kg/m   Physical Exam  Constitutional: She is oriented to person, place, and time. She appears well-developed and well-nourished. No distress.  HENT:  Head: Normocephalic and atraumatic.  Eyes: EOM are normal.  Neck: Neck supple.  No obvious palpable lymphadenopathy in her anterior posterior neck.  She does have some spasm of the left paracervical muscles.  She is able to range her neck I will be limited secondary to pain.  Cardiovascular: Normal rate, regular rhythm and normal heart sounds.  Pulmonary/Chest: Effort normal and breath sounds normal.  Abdominal: Soft. She exhibits no distension. There is no tenderness.  Musculoskeletal: Normal range of motion.    Neurological: She is alert and oriented to person, place, and time.  Skin: Skin is warm and dry.  Psychiatric: She has a normal mood and affect. Judgment normal.  Nursing note and vitals reviewed.    ED Treatments / Results  Labs (all labs ordered are listed, but only abnormal results are displayed) Labs Reviewed  CBC WITH DIFFERENTIAL/PLATELET - Abnormal; Notable for the following components:      Result Value   Platelets 134 (*)    All other  components within normal limits  BASIC METABOLIC PANEL - Abnormal; Notable for the following components:   Glucose, Bld 102 (*)    Creatinine, Ser 1.28 (*)    GFR calc non Af Amer 38 (*)    GFR calc Af Amer 44 (*)    All other components within normal limits    EKG None  Radiology Ct Abdomen Pelvis Wo Contrast  Result Date: 05/24/2018 CLINICAL DATA:  Lung mass seen on CT scan. EXAM: CT CHEST, ABDOMEN AND PELVIS WITHOUT CONTRAST TECHNIQUE: Multidetector CT imaging of the chest, abdomen and pelvis was performed following the standard protocol without IV contrast. COMPARISON:  CT scan of neck of same day. CT scan of February 25, 2006. FINDINGS: CT CHEST FINDINGS Cardiovascular: Atherosclerosis of thoracic aorta is noted without aneurysm formation. Normal cardiac size. No pericardial effusion. Mediastinum/Nodes: Stable calcified nodule seen in left thyroid lobe. The esophagus is unremarkable. Evaluation of adenopathy is limited due to lack of intravenous contrast. 3.4 x 1.3 cm lymph node is noted in aortopulmonary window which appears to be enlarged compared to prior exam. Subcarinal adenopathy measuring 4.9 x 1.9 cm is noted with calcifications. 15 mm right hilar lymph node is noted with central calcification. 19 mm left infrahilar lymph node is noted with calcifications. Lungs/Pleura: No pneumothorax or pleural effusion is noted. 11 x 9 mm spiculated nodule is noted in right lower lobe best seen on image number 64 series 3 consistent with malignancy.  Nodular densities are noted in the right major fissure near by concerning for metastatic disease. 3.9 x 2.9 cm cavitary lesion is seen in left lung apex concerning for malignancy. 9 x 4 mm pleural based mass is noted laterally in the left lower lobe best seen on image number 78 of series 3. Musculoskeletal: No chest wall mass or suspicious bone lesions identified. CT ABDOMEN PELVIS FINDINGS Hepatobiliary: No focal liver abnormality is seen. No gallstones, gallbladder wall thickening, or biliary dilatation. Pancreas: Unremarkable. No pancreatic ductal dilatation or surrounding inflammatory changes. Spleen: Normal in size without focal abnormality. Adrenals/Urinary Tract: Adrenal glands appear normal. Right renal atrophy is noted with 13 mm calculus noted in lower pole collecting system. No hydronephrosis or renal obstruction is noted. Urinary bladder is unremarkable. Stomach/Bowel: The stomach appears normal. The appendix is not visualized. Diverticulosis of descending and sigmoid colon is noted without inflammation. Vascular/Lymphatic: Aortic atherosclerosis. No enlarged abdominal or pelvic lymph nodes. Reproductive: Uterus and bilateral adnexa are unremarkable. Other: No abdominal wall hernia or abnormality. No abdominopelvic ascites. Musculoskeletal: No acute or significant osseous findings. IMPRESSION: 3.9 x 2.9 cm cavitary lesion is seen in left lung apex most consistent with malignancy. Also noted is 11 x 9 mm spiculated nodule in right lower lobe with adjacent nodular thickening of the right major fissure concerning for malignancy or metastatic disease. 9 x 4 mm pleural-based mass is noted laterally in the left lower lobe also concerning for metastatic disease. PET scan is recommended for further evaluation. Evaluation of the mediastinum is limited due to lack of intravenous contrast, but there does appear to be adenopathy in the subcarinal, aortopulmonary window in both hilar regions which is increased in  size compared to prior exam. Several of these areas contain calcifications, suggesting that the adenopathy may be due to previously treated lymphoma. However, given the possibility of malignancy seen in the lung fields, metastatic disease cannot be excluded. Right renal atrophy is noted with 13 mm nonobstructive calculus seen in lower pole collecting system. No hydronephrosis is noted. Diverticulosis  of descending and sigmoid colon is noted without inflammation. Aortic Atherosclerosis (ICD10-I70.0). Electronically Signed   By: Marijo Conception, M.D.   On: 05/24/2018 12:39   Ct Soft Tissue Neck Wo Contrast  Result Date: 05/24/2018 CLINICAL DATA:  Left neck pain.  History of lymphoma EXAM: CT NECK WITHOUT CONTRAST TECHNIQUE: Multidetector CT imaging of the neck was performed following the standard protocol without intravenous contrast. COMPARISON:  CT neck 04/08/2004, CT chest 08/25/2004 FINDINGS: Pharynx and larynx: Normal. No mass or swelling. Intravenous contrast not administered due to allergy Salivary glands: No inflammation, mass, or stone. Thyroid: Calcified nodule left thyroid measures 12 x 15 mm unchanged from 2005. Right lobe of the thyroid normal. Lymph nodes: No enlarged lymph nodes in the neck. Vascular: Limited evaluation without intravenous contrast. Carotid artery calcification bilaterally. Limited intracranial: Negative Visualized orbits: Negative Mastoids and visualized paranasal sinuses: Negative Skeleton: Mild degenerative changes in the cervical spine. No acute skeletal abnormality. Upper chest: Cavitary mass left lung apex measures 3.1 x 3.6 cm. This has irregular spiculated margins and extends to the superior pleural surface without definite invasion of the chest wall. Enlarged anterior mediastinal lymph node 15 x 30 mm. Enlarged right paratracheal lymph node 15 mm. Probable right hilar lymphadenopathy incompletely evaluated. 15 mm sub solid density right lung apex. Other: None IMPRESSION: No  acute abnormality in the neck.  No cause for acute neck pain Cavitary mass left upper lobe with mediastinal adenopathy most consistent with carcinoma of the lung. The patient has history of lymphoma however the radiographic imaging findings are more compatible with lung cancer than recurrent lymphoma. Sub solid density right lung apex. Follow-up chest CT recommended for further evaluation These results were called by telephone at the time of interpretation on 05/24/2018 at 11:47 am to Dr. Jola Schmidt , who verbally acknowledged these results. Electronically Signed   By: Franchot Gallo M.D.   On: 05/24/2018 11:48   Ct Chest Wo Contrast  Result Date: 05/24/2018 CLINICAL DATA:  Lung mass seen on CT scan. EXAM: CT CHEST, ABDOMEN AND PELVIS WITHOUT CONTRAST TECHNIQUE: Multidetector CT imaging of the chest, abdomen and pelvis was performed following the standard protocol without IV contrast. COMPARISON:  CT scan of neck of same day. CT scan of February 25, 2006. FINDINGS: CT CHEST FINDINGS Cardiovascular: Atherosclerosis of thoracic aorta is noted without aneurysm formation. Normal cardiac size. No pericardial effusion. Mediastinum/Nodes: Stable calcified nodule seen in left thyroid lobe. The esophagus is unremarkable. Evaluation of adenopathy is limited due to lack of intravenous contrast. 3.4 x 1.3 cm lymph node is noted in aortopulmonary window which appears to be enlarged compared to prior exam. Subcarinal adenopathy measuring 4.9 x 1.9 cm is noted with calcifications. 15 mm right hilar lymph node is noted with central calcification. 19 mm left infrahilar lymph node is noted with calcifications. Lungs/Pleura: No pneumothorax or pleural effusion is noted. 11 x 9 mm spiculated nodule is noted in right lower lobe best seen on image number 64 series 3 consistent with malignancy. Nodular densities are noted in the right major fissure near by concerning for metastatic disease. 3.9 x 2.9 cm cavitary lesion is seen in left  lung apex concerning for malignancy. 9 x 4 mm pleural based mass is noted laterally in the left lower lobe best seen on image number 78 of series 3. Musculoskeletal: No chest wall mass or suspicious bone lesions identified. CT ABDOMEN PELVIS FINDINGS Hepatobiliary: No focal liver abnormality is seen. No gallstones, gallbladder wall thickening, or biliary  dilatation. Pancreas: Unremarkable. No pancreatic ductal dilatation or surrounding inflammatory changes. Spleen: Normal in size without focal abnormality. Adrenals/Urinary Tract: Adrenal glands appear normal. Right renal atrophy is noted with 13 mm calculus noted in lower pole collecting system. No hydronephrosis or renal obstruction is noted. Urinary bladder is unremarkable. Stomach/Bowel: The stomach appears normal. The appendix is not visualized. Diverticulosis of descending and sigmoid colon is noted without inflammation. Vascular/Lymphatic: Aortic atherosclerosis. No enlarged abdominal or pelvic lymph nodes. Reproductive: Uterus and bilateral adnexa are unremarkable. Other: No abdominal wall hernia or abnormality. No abdominopelvic ascites. Musculoskeletal: No acute or significant osseous findings. IMPRESSION: 3.9 x 2.9 cm cavitary lesion is seen in left lung apex most consistent with malignancy. Also noted is 11 x 9 mm spiculated nodule in right lower lobe with adjacent nodular thickening of the right major fissure concerning for malignancy or metastatic disease. 9 x 4 mm pleural-based mass is noted laterally in the left lower lobe also concerning for metastatic disease. PET scan is recommended for further evaluation. Evaluation of the mediastinum is limited due to lack of intravenous contrast, but there does appear to be adenopathy in the subcarinal, aortopulmonary window in both hilar regions which is increased in size compared to prior exam. Several of these areas contain calcifications, suggesting that the adenopathy may be due to previously treated  lymphoma. However, given the possibility of malignancy seen in the lung fields, metastatic disease cannot be excluded. Right renal atrophy is noted with 13 mm nonobstructive calculus seen in lower pole collecting system. No hydronephrosis is noted. Diverticulosis of descending and sigmoid colon is noted without inflammation. Aortic Atherosclerosis (ICD10-I70.0). Electronically Signed   By: Marijo Conception, M.D.   On: 05/24/2018 12:39    Procedures Procedures (including critical care time)  Medications Ordered in ED Medications  diazepam (VALIUM) tablet 2 mg (2 mg Oral Given 05/24/18 1128)  acetaminophen (TYLENOL) tablet 650 mg (650 mg Oral Given 05/24/18 1128)  ketorolac (TORADOL) 30 MG/ML injection 30 mg (30 mg Intravenous Given 05/24/18 1135)  morphine 4 MG/ML injection 4 mg (4 mg Intravenous Given 05/24/18 1136)     Initial Impression / Assessment and Plan / ED Course  I have reviewed the triage vital signs and the nursing notes.  Pertinent labs & imaging results that were available during my care of the patient were reviewed by me and considered in my medical decision making (see chart for details).     I do not believe her symptomatology to be meningitis.  She feels much better after anti-inflammatories muscle relaxants and a small dose of pain medicine.  Initial CT imaging the neck demonstrates no acute pathology in the neck however there is concerning for a neoplastic process in the right upper lobe.  After discussion with radiology she underwent CT imaging of her chest abdomen and pelvis which demonstrates multiple areas of abnormality within her lungs concerning for metastatic disease as well as a cavitary right upper lobe lesion concerning for lung cancer.  Her oncologist is Dr. Marin Olp.  She will contact his office for follow-up.  I have sent him a personal message via the electronic medical record for follow-up purposes.  Patient understands return to the ER for new or worsening symptoms.   Patient and husband understand the concerning findings on CT imaging today and understand the need for close follow-up for further evaluation  Final Clinical Impressions(s) / ED Diagnoses   Final diagnoses:  Acute neck pain  Cavitating mass in right upper lung lobe  ED Discharge Orders    None       Jola Schmidt, MD 05/24/18 [phone]

## 2018-05-24 NOTE — ED Notes (Signed)
Patient transported to CT 

## 2018-05-24 NOTE — ED Triage Notes (Signed)
Pt c/o neck pain since yesterday, pain on movement from side to side. Pt concerned this is from where she had a upper GI endoscopy on 05/11/18;

## 2018-05-27 ENCOUNTER — Telehealth: Payer: Self-pay | Admitting: Hematology & Oncology

## 2018-05-27 NOTE — Telephone Encounter (Signed)
lmom to inform pt of lab/md appt 7/15 at 0830 per sch msg

## 2018-05-30 ENCOUNTER — Inpatient Hospital Stay: Payer: Medicare Other | Attending: Family | Admitting: Hematology & Oncology

## 2018-05-30 ENCOUNTER — Inpatient Hospital Stay: Payer: Medicare Other

## 2018-05-30 ENCOUNTER — Other Ambulatory Visit: Payer: Self-pay

## 2018-05-30 ENCOUNTER — Encounter: Payer: Self-pay | Admitting: Hematology & Oncology

## 2018-05-30 VITALS — BP 133/79 | HR 70 | Temp 97.9°F | Resp 16 | Wt 135.0 lb

## 2018-05-30 DIAGNOSIS — R911 Solitary pulmonary nodule: Secondary | ICD-10-CM

## 2018-05-30 DIAGNOSIS — Z1231 Encounter for screening mammogram for malignant neoplasm of breast: Secondary | ICD-10-CM

## 2018-05-30 DIAGNOSIS — C8331 Diffuse large B-cell lymphoma, lymph nodes of head, face, and neck: Secondary | ICD-10-CM

## 2018-05-30 DIAGNOSIS — Z8572 Personal history of non-Hodgkin lymphomas: Secondary | ICD-10-CM | POA: Diagnosis not present

## 2018-05-30 DIAGNOSIS — R599 Enlarged lymph nodes, unspecified: Secondary | ICD-10-CM | POA: Diagnosis not present

## 2018-05-30 DIAGNOSIS — R918 Other nonspecific abnormal finding of lung field: Secondary | ICD-10-CM | POA: Diagnosis not present

## 2018-05-30 DIAGNOSIS — Z79899 Other long term (current) drug therapy: Secondary | ICD-10-CM | POA: Diagnosis not present

## 2018-05-30 LAB — CMP (CANCER CENTER ONLY)
ALT: 22 U/L (ref 10–47)
ANION GAP: 8 (ref 5–15)
AST: 29 U/L (ref 11–38)
Albumin: 3.2 g/dL — ABNORMAL LOW (ref 3.5–5.0)
Alkaline Phosphatase: 112 U/L — ABNORMAL HIGH (ref 26–84)
BUN: 15 mg/dL (ref 7–22)
CO2: 31 mmol/L (ref 18–33)
Calcium: 9.8 mg/dL (ref 8.0–10.3)
Chloride: 102 mmol/L (ref 98–108)
Creatinine: 1.5 mg/dL — ABNORMAL HIGH (ref 0.60–1.20)
GLUCOSE: 105 mg/dL (ref 73–118)
Potassium: 4.9 mmol/L — ABNORMAL HIGH (ref 3.3–4.7)
Sodium: 141 mmol/L (ref 128–145)
TOTAL PROTEIN: 8.5 g/dL — AB (ref 6.4–8.1)
Total Bilirubin: 0.5 mg/dL (ref 0.2–1.6)

## 2018-05-30 LAB — CBC WITH DIFFERENTIAL (CANCER CENTER ONLY)
Basophils Absolute: 0 10*3/uL (ref 0.0–0.1)
Basophils Relative: 1 %
EOS ABS: 0.1 10*3/uL (ref 0.0–0.5)
EOS PCT: 3 %
HCT: 36.4 % (ref 34.8–46.6)
Hemoglobin: 11.6 g/dL (ref 11.6–15.9)
LYMPHS ABS: 1.2 10*3/uL (ref 0.9–3.3)
Lymphocytes Relative: 36 %
MCH: 29 pg (ref 26.0–34.0)
MCHC: 31.9 g/dL — AB (ref 32.0–36.0)
MCV: 91 fL (ref 81.0–101.0)
MONOS PCT: 14 %
Monocytes Absolute: 0.5 10*3/uL (ref 0.1–0.9)
Neutro Abs: 1.5 10*3/uL (ref 1.5–6.5)
Neutrophils Relative %: 46 %
PLATELETS: 167 10*3/uL (ref 145–400)
RBC: 4 MIL/uL (ref 3.70–5.32)
RDW: 12 % (ref 11.1–15.7)
WBC Count: 3.2 10*3/uL — ABNORMAL LOW (ref 3.9–10.0)

## 2018-05-30 LAB — LACTATE DEHYDROGENASE: LDH: 197 U/L — AB (ref 98–192)

## 2018-05-30 NOTE — Progress Notes (Signed)
Hematology and Oncology Follow Up Visit  MERYLE PUGMIRE [id number] 1934/07/05 84 y.o. 05/30/2018   Principle Diagnosis:  Diffuse large cell non-Hodgkin's lymphoma-remission  Current Therapy:   Observation    Interim History:  Ms. Cozine is here today for an unscheduled visit.  Apparently, she had to go the emergency room recently because of some neck pain.  She was not found to have any issues with her neck.  However, a CT of the chest was done.  Shockingly, this showed multiple lung nodules.  She had a 3.4 x 1.3 cm lymph node in the AP window.  She has subcarinal adenopathy measuring 4.9 x 1.9 cm.  She had a left lung lesion measuring 3.9 x 2.9 cm.  This was cavitary.  Everything else looked okay.  She was then referred back to me.  She has never smoked.  She may have had a remote secondhand tobacco exposure.  She does not have any fever.  She is had no cough.  She has not participated in the senior Olympics and as always won 10 gold metals.  I have absolutely no idea what we could be looking at.  I would not think that recurrent lymphoma which show up in this manner.  She is now about 18 years from her lymphoma.   She has had no rashes.  There is no weight loss.  There is no change in bowel or bladder habits.  There is no fever.  She has had no hemoptysis.  She has not had a mammogram in quite a while.  Overall, her performance status is ECOG 0.   Medications:  Allergies as of 05/30/2018      Reactions   Lisinopril Swelling   Facial and lip swelling   Iohexol     Desc: hives,itiching      Medication List        Accurate as of 05/30/18  9:45 AM. Always use your most recent med list.          carvedilol 6.25 MG tablet Commonly known as:  COREG Take 1 tablet (6.25 mg total) by mouth 2 (two) times daily.   CENTRUM PO Take 1 tablet by mouth daily.   diazepam 2 MG tablet Commonly known as:  VALIUM Take 2.5 tablets (5 mg total) by mouth every 8 (eight) hours as needed for  muscle spasms.   FISH OIL PO Take 1 tablet by mouth daily.   furosemide 20 MG tablet Commonly known as:  LASIX Take 1 tablet (20 mg total) by mouth daily as needed for fluid or edema (do not take if blood pressure is  low or any vomiting or diarrhea).   hydrALAZINE 10 MG tablet Commonly known as:  APRESOLINE Take 1 tablet (10 mg total) by mouth 3 (three) times daily.   HYDROcodone-acetaminophen 5-325 MG tablet Commonly known as:  NORCO/VICODIN Take 1 tablet by mouth every 6 (six) hours as needed for moderate pain.   isosorbide mononitrate 30 MG 24 hr tablet Commonly known as:  IMDUR Take 0.5 tablets (15 mg total) by mouth daily.   levothyroxine 25 MCG tablet Commonly known as:  SYNTHROID, LEVOTHROID TAKE 1 TABLET(25 MCG) BY MOUTH DAILY   PROBIOTIC DAILY PO Take by mouth.       Allergies:  Allergies  Allergen Reactions  . Lisinopril Swelling    Facial and lip swelling  . Iohexol      Desc: hives,itiching     Past Medical History, Surgical history, Social history, and Family  History were reviewed and updated.  Review of Systems: Review of Systems  Constitutional: Negative.   HENT: Negative.   Eyes: Negative.   Respiratory: Negative.   Cardiovascular: Negative.   Gastrointestinal: Negative.   Genitourinary: Negative.   Musculoskeletal: Negative.   Skin: Negative.   Neurological: Negative.   Endo/Heme/Allergies: Negative.   Psychiatric/Behavioral: Negative.    Marland Kitchen   Physical Exam:  weight is 135 lb (61.2 kg). Her oral temperature is 97.9 F (36.6 C). Her blood pressure is 133/79 and her pulse is 70. Her respiration is 16 and oxygen saturation is 100%.   Wt Readings from Last 3 Encounters:  05/30/18 135 lb (61.2 kg)  05/24/18 137 lb (62.1 kg)  05/11/18 137 lb (62.1 kg)    Physical Exam  Constitutional: She is oriented to person, place, and time.  HENT:  Head: Normocephalic and atraumatic.  Mouth/Throat: Oropharynx is clear and moist.  Eyes: Pupils  are equal, round, and reactive to light. EOM are normal.  Neck: Normal range of motion.  Cardiovascular: Normal rate, regular rhythm and normal heart sounds.  Pulmonary/Chest: Effort normal and breath sounds normal.  Abdominal: Soft. Bowel sounds are normal.  Musculoskeletal: Normal range of motion. She exhibits no edema, tenderness or deformity.  Lymphadenopathy:    She has no cervical adenopathy.  Neurological: She is alert and oriented to person, place, and time.  Skin: Skin is warm and dry. No rash noted. No erythema.  Psychiatric: She has a normal mood and affect. Her behavior is normal. Judgment and thought content normal.  Vitals reviewed.     Lab Results  Component Value Date   WBC 3.2 (L) 05/30/2018   HGB 11.6 05/30/2018   HCT 36.4 05/30/2018   MCV 91.0 05/30/2018   PLT 167 05/30/2018   No results found for: FERRITIN, IRON, TIBC, UIBC, IRONPCTSAT Lab Results  Component Value Date   RETICCTPCT 1.1 10/15/2011   RBC 4.00 05/30/2018   RETICCTABS 49.7 10/15/2011   No results found for: KPAFRELGTCHN, LAMBDASER, KAPLAMBRATIO No results found for: IGGSERUM, IGA, IGMSERUM No results found for: Odetta Pink, SPEI   Chemistry      Component Value Date/Time   NA 141 05/30/2018 0838   NA 138 01/21/2017 1021   K 4.9 (H) 05/30/2018 0838   K 5.1 01/21/2017 1021   CL 102 05/30/2018 0838   CO2 31 05/30/2018 0838   CO2 30 (H) 01/21/2017 1021   BUN 15 05/30/2018 0838   BUN 30.0 (H) 01/21/2017 1021   CREATININE 1.50 (H) 05/30/2018 0838   CREATININE 1.8 (H) 01/21/2017 1021      Component Value Date/Time   CALCIUM 9.8 05/30/2018 0838   CALCIUM 11.1 (H) 01/21/2017 1021   ALKPHOS 112 (H) 05/30/2018 0838   ALKPHOS 124 01/21/2017 1021   AST 29 05/30/2018 0838   AST 27 01/21/2017 1021   ALT 22 05/30/2018 0838   ALT 25 01/21/2017 1021   BILITOT 0.5 05/30/2018 0838   BILITOT 0.50 01/21/2017 1021     Impression and Plan: Ms.  Mault is a very pleasant 84 year old African American female with a history of diffuse large cell non-Hodgkin lymphoma. She is now 18 years out from therapy and so far there has been no evidence of recurrence.   I am shocked as to what the CT scan shows.  Again, I just do not believe that this is anything related to lymphoma.  She needs to have a PET scan done.  She  needs to have a mammogram done.  I spent about 40 minutes with her.  I spent all the time face-to-face with her.  I reviewed her CT scan.  I went over the issues and I went over the possibilities that we are looking at.  Ultimately, I suspect that she is probably going to end up with a bronchoscopy to find out what is going on.  I am not sent off any type of tumor markers as of yet.  I will plan to see her back once we get the results back from her PET scan.    Volanda Napoleon, MD 7/15/20199:45 AM

## 2018-06-02 ENCOUNTER — Other Ambulatory Visit: Payer: Self-pay | Admitting: Cardiology

## 2018-06-02 DIAGNOSIS — I428 Other cardiomyopathies: Secondary | ICD-10-CM

## 2018-06-02 MED FILL — hydrALAZINE HCL 10 MG TABS: 10 | 90 days supply | Qty: 270 | Fill #0

## 2018-06-02 NOTE — Telephone Encounter (Signed)
Rx request sent to pharmacy.  

## 2018-06-06 ENCOUNTER — Encounter (HOSPITAL_COMMUNITY)
Admission: RE | Admit: 2018-06-06 | Discharge: 2018-06-06 | Disposition: A | Discharge status: Home / Self Care | Payer: Medicare Other | Source: Ambulatory Visit | Attending: Hematology & Oncology | Admitting: Hematology & Oncology

## 2018-06-06 DIAGNOSIS — R918 Other nonspecific abnormal finding of lung field: Secondary | ICD-10-CM | POA: Diagnosis not present

## 2018-06-06 DIAGNOSIS — R911 Solitary pulmonary nodule: Secondary | ICD-10-CM

## 2018-06-06 LAB — GLUCOSE, CAPILLARY: Glucose-Capillary: 121 mg/dL — ABNORMAL HIGH (ref 70–99)

## 2018-06-06 MED ORDER — FLUDEOXYGLUCOSE F - 18 (FDG) INJECTION
6.7300 | Freq: Once | INTRAVENOUS | Status: AC | PRN
  Administered 2018-06-06: 6.73 via INTRAVENOUS

## 2018-06-08 ENCOUNTER — Other Ambulatory Visit: Payer: Self-pay | Admitting: Hematology & Oncology

## 2018-06-08 DIAGNOSIS — J984 Other disorders of lung: Secondary | ICD-10-CM

## 2018-06-08 NOTE — Progress Notes (Signed)
I called Crystal [name] about the PET scan.  I told her that it was positive for activity with a large mass in the left upper lung.  She has lymph nodes in the center of the chest.  Her graph she will need a biopsy.  I will see if radiology can do a percutaneous biopsy.  I put the order in for the biopsy on 06/08/2018 at 2:37 PM.  Hopefully, the biopsy will be set up for early next week.  Crystal [name] understands all of this.  Again, she has never smoked.  Her history of lymphoma is probably 17-18 years gone.  Crystal Haw, MD

## 2018-06-09 ENCOUNTER — Other Ambulatory Visit: Payer: Self-pay | Admitting: Family Medicine

## 2018-06-13 ENCOUNTER — Telehealth: Payer: Self-pay | Admitting: Emergency Medicine

## 2018-06-13 NOTE — Telephone Encounter (Signed)
Thanks

## 2018-06-13 NOTE — Telephone Encounter (Signed)
Please set this patient up for new consult with me for lung mass and mediastinal lymphadenopathy. Add her in on Wednesday 8/7 at 1:30 if unable to squeeze her into my schedule on 8/6 or 8/9. Thanks

## 2018-06-13 NOTE — Telephone Encounter (Signed)
RB's schedule on 8/6 and 8/9 were 100% booked.  Called pt letting her know that RB wanted pt to see him for a new consult with him for the lung mass and mediastinal lymphadenopathy.  Pt expressed understanding. Asked pt if she could come see RB 8/7 at 1:30pm and pt stated that was fine. Added pt on RB's schedule 8/7 at 1:30 for the consult. Nothing further needed.  Routing to RB as an FYI that this was scheduled.

## 2018-06-16 ENCOUNTER — Other Ambulatory Visit: Payer: Self-pay | Admitting: Family Medicine

## 2018-06-16 ENCOUNTER — Other Ambulatory Visit: Payer: Self-pay | Admitting: *Deleted

## 2018-06-16 DIAGNOSIS — C8331 Diffuse large B-cell lymphoma, lymph nodes of head, face, and neck: Secondary | ICD-10-CM

## 2018-06-17 ENCOUNTER — Other Ambulatory Visit: Payer: Self-pay

## 2018-06-17 ENCOUNTER — Encounter: Payer: Self-pay | Admitting: Hematology & Oncology

## 2018-06-17 ENCOUNTER — Inpatient Hospital Stay (HOSPITAL_BASED_OUTPATIENT_CLINIC_OR_DEPARTMENT_OTHER): Payer: Medicare Other | Admitting: Hematology & Oncology

## 2018-06-17 ENCOUNTER — Inpatient Hospital Stay: Payer: Medicare Other | Attending: Hematology & Oncology

## 2018-06-17 VITALS — BP 152/74 | HR 76 | Temp 98.4°F | Resp 18 | Wt 133.0 lb

## 2018-06-17 DIAGNOSIS — C8331 Diffuse large B-cell lymphoma, lymph nodes of head, face, and neck: Secondary | ICD-10-CM

## 2018-06-17 DIAGNOSIS — Z79899 Other long term (current) drug therapy: Secondary | ICD-10-CM | POA: Diagnosis not present

## 2018-06-17 DIAGNOSIS — Z8572 Personal history of non-Hodgkin lymphomas: Secondary | ICD-10-CM

## 2018-06-17 DIAGNOSIS — R918 Other nonspecific abnormal finding of lung field: Secondary | ICD-10-CM

## 2018-06-17 LAB — CBC WITH DIFFERENTIAL (CANCER CENTER ONLY)
BASOS PCT: 0 %
Basophils Absolute: 0 10*3/uL (ref 0.0–0.1)
EOS ABS: 0.1 10*3/uL (ref 0.0–0.5)
EOS PCT: 3 %
HCT: 36.7 % (ref 34.8–46.6)
Hemoglobin: 11.6 g/dL (ref 11.6–15.9)
LYMPHS ABS: 1.1 10*3/uL (ref 0.9–3.3)
Lymphocytes Relative: 41 %
MCH: 29.1 pg (ref 26.0–34.0)
MCHC: 31.6 g/dL — AB (ref 32.0–36.0)
MCV: 92.2 fL (ref 81.0–101.0)
MONOS PCT: 14 %
Monocytes Absolute: 0.4 10*3/uL (ref 0.1–0.9)
NEUTROS PCT: 42 %
Neutro Abs: 1.2 10*3/uL — ABNORMAL LOW (ref 1.5–6.5)
Platelet Count: 150 10*3/uL (ref 145–400)
RBC: 3.98 MIL/uL (ref 3.70–5.32)
RDW: 12.4 % (ref 11.1–15.7)
WBC: 2.8 10*3/uL — AB (ref 3.9–10.0)

## 2018-06-17 LAB — CMP (CANCER CENTER ONLY)
ALBUMIN: 3.4 g/dL — AB (ref 3.5–5.0)
ALT: 19 U/L (ref 0–44)
AST: 21 U/L (ref 15–41)
Alkaline Phosphatase: 134 U/L — ABNORMAL HIGH (ref 38–126)
Anion gap: 9 (ref 5–15)
BUN: 14 mg/dL (ref 8–23)
CALCIUM: 10.2 mg/dL (ref 8.9–10.3)
CO2: 27 mmol/L (ref 22–32)
CREATININE: 1.49 mg/dL — AB (ref 0.44–1.00)
Chloride: 106 mmol/L (ref 98–111)
GFR, EST AFRICAN AMERICAN: 37 mL/min — AB (ref >60)
GFR, Estimated: 32 mL/min — ABNORMAL LOW (ref >60)
Glucose, Bld: 109 mg/dL — ABNORMAL HIGH (ref 70–99)
Potassium: 5.4 mmol/L — ABNORMAL HIGH (ref 3.5–5.1)
SODIUM: 142 mmol/L (ref 135–145)
TOTAL PROTEIN: 9.1 g/dL — AB (ref 6.5–8.1)
Total Bilirubin: 0.4 mg/dL (ref 0.3–1.2)

## 2018-06-17 LAB — LACTATE DEHYDROGENASE: LDH: 179 U/L (ref 98–192)

## 2018-06-17 NOTE — Progress Notes (Signed)
Hematology and Oncology Follow Up Visit  Crystal [name] [id number] 1933/12/14 84 y.o. 06/17/2018   Principle Diagnosis:  Diffuse large cell non-Hodgkin's lymphoma-remission  Current Therapy:   Observation    Interim History:  Crystal [name] is here today for follow-up.  Unfortunately, looks like we are dealing with a bronchogenic carcinoma.  She had a PET scan done on July 22.  The PET scan showed marked activity in the left apical mass.  It is a 4 cm necrotic cavitary mass with an SUV of 11.2.  She has bulky mediastinal and hilar adenopathy with a SUV of 14.5.  Also noted is a left adrenal gland nodule with a SUV of 4.7.  She is going to see pulmonary next week.  Hopefully, they will be able to do a bronchoscopy and get tissue for [place] to know exactly what is going on.  I just cannot imagine that this is a recurrent lymphoma as it has been about 18 years since she had large cell non-Hodgkin's lymphoma.  I have never seen large cell non-Hodgkin's lymphoma recur after 7 years.  She is had no cough.  There is no hemoptysis.  She is had no chest wall pain.  She is had no shortness of breath.  Her weight is down a little bit.  Her appetite is okay.  Overall, her performance status is ECOG 1.  Medications:  Allergies as of 06/17/2018      Reactions   Lisinopril Swelling   Facial and lip swelling   Iohexol     Desc: hives,itiching      Medication List        Accurate as of 06/17/18  8:56 AM. Always use your most recent med list.          carvedilol 6.25 MG tablet Commonly known as:  COREG Take 1 tablet (6.25 mg total) by mouth 2 (two) times daily.   CENTRUM PO Take 1 tablet by mouth daily.   diazepam 2 MG tablet Commonly known as:  VALIUM Take 2.5 tablets (5 mg total) by mouth every 8 (eight) hours as needed for muscle spasms.   FISH OIL PO Take 1 tablet by mouth daily.   furosemide 20 MG tablet Commonly known as:  LASIX Take 1 tablet (20 mg total) by mouth daily as needed for  fluid or edema (do not take if blood pressure is  low or any vomiting or diarrhea).   hydrALAZINE 10 MG tablet Commonly known as:  APRESOLINE TAKE 1 TABLET (10 MG TOTAL) BY MOUTH 3 (THREE) TIMES DAILY.   HYDROcodone-acetaminophen 5-325 MG tablet Commonly known as:  NORCO/VICODIN Take 1 tablet by mouth every 6 (six) hours as needed for moderate pain.   isosorbide mononitrate 30 MG 24 hr tablet Commonly known as:  IMDUR Take 0.5 tablets (15 mg total) by mouth daily.   levothyroxine 25 MCG tablet Commonly known as:  SYNTHROID, LEVOTHROID Take 1 tablet (25 mcg total) by mouth daily before breakfast.   PROBIOTIC DAILY PO Take by mouth.       Allergies:  Allergies  Allergen Reactions  . Lisinopril Swelling    Facial and lip swelling  . Iohexol      Desc: hives,itiching     Past Medical History, Surgical history, Social history, and Family History were reviewed and updated.  Review of Systems: Review of Systems  Constitutional: Negative.   HENT: Negative.   Eyes: Negative.   Respiratory: Negative.   Cardiovascular: Negative.   Gastrointestinal: Negative.  Genitourinary: Negative.   Musculoskeletal: Negative.   Skin: Negative.   Neurological: Negative.   Endo/Heme/Allergies: Negative.   Psychiatric/Behavioral: Negative.    Marland Kitchen   Physical Exam:  weight is 133 lb (60.3 kg). Her oral temperature is 98.4 F (36.9 C). Her blood pressure is 152/74 (abnormal) and her pulse is 76. Her respiration is 18 and oxygen saturation is 100%.   Wt Readings from Last 3 Encounters:  06/17/18 133 lb (60.3 kg)  05/30/18 135 lb (61.2 kg)  05/24/18 137 lb (62.1 kg)    Physical Exam  Constitutional: She is oriented to person, place, and time.  HENT:  Head: Normocephalic and atraumatic.  Mouth/Throat: Oropharynx is clear and moist.  Eyes: Pupils are equal, round, and reactive to light. EOM are normal.  Neck: Normal range of motion.  Cardiovascular: Normal rate, regular rhythm and  normal heart sounds.  Pulmonary/Chest: Effort normal and breath sounds normal.  Abdominal: Soft. Bowel sounds are normal.  Musculoskeletal: Normal range of motion. She exhibits no edema, tenderness or deformity.  Lymphadenopathy:    She has no cervical adenopathy.  Neurological: She is alert and oriented to person, place, and time.  Skin: Skin is warm and dry. No rash noted. No erythema.  Psychiatric: She has a normal mood and affect. Her behavior is normal. Judgment and thought content normal.  Vitals reviewed.     Lab Results  Component Value Date   WBC 2.8 (L) 06/17/2018   HGB 11.6 06/17/2018   HCT 36.7 06/17/2018   MCV 92.2 06/17/2018   PLT 150 06/17/2018   No results found for: FERRITIN, IRON, TIBC, UIBC, IRONPCTSAT Lab Results  Component Value Date   RETICCTPCT 1.1 10/15/2011   RBC 3.98 06/17/2018   RETICCTABS 49.7 10/15/2011   No results found for: KPAFRELGTCHN, LAMBDASER, KAPLAMBRATIO No results found for: IGGSERUM, IGA, IGMSERUM No results found for: Odetta Pink, SPEI   Chemistry      Component Value Date/Time   NA 141 05/30/2018 0838   NA 138 01/21/2017 1021   K 4.9 (H) 05/30/2018 0838   K 5.1 01/21/2017 1021   CL 102 05/30/2018 0838   CO2 31 05/30/2018 0838   CO2 30 (H) 01/21/2017 1021   BUN 15 05/30/2018 0838   BUN 30.0 (H) 01/21/2017 1021   CREATININE 1.50 (H) 05/30/2018 0838   CREATININE 1.8 (H) 01/21/2017 1021      Component Value Date/Time   CALCIUM 9.8 05/30/2018 0838   CALCIUM 11.1 (H) 01/21/2017 1021   ALKPHOS 112 (H) 05/30/2018 0838   ALKPHOS 124 01/21/2017 1021   AST 29 05/30/2018 0838   AST 27 01/21/2017 1021   ALT 22 05/30/2018 0838   ALT 25 01/21/2017 1021   BILITOT 0.5 05/30/2018 0838   BILITOT 0.50 01/21/2017 1021     Impression and Plan: Crystal [name] is a very pleasant 84 year old African American female with a history of diffuse large cell non-Hodgkin lymphoma. She is now 18 years  out from therapy and so far there has been no evidence of recurrence.   At this point, is all about a biopsy.  We had to have a tissue diagnosis to know what is going on.  If this is lung cancer, I would have to believe that this is an adenocarcinoma since she has never smoked.  I do not think there is any secondhand exposure.  I cannot imagine that this is a benign process like sarcoidosis.  I would not think that  this is an infection.  Again, we will await any invasive procedure with biopsies so that we will know what to do next.  I spent about 40 minutes with her today.  All the time spent face-to-face.  I was counseling her and coordinating future care.  We went over the PET scan.  I answered all of her questions.     Volanda Napoleon, MD 8/2/20198:56 AM

## 2018-06-22 ENCOUNTER — Ambulatory Visit (INDEPENDENT_AMBULATORY_CARE_PROVIDER_SITE_OTHER): Payer: Medicare Other | Admitting: Emergency Medicine

## 2018-06-22 ENCOUNTER — Encounter: Payer: Self-pay | Admitting: Emergency Medicine

## 2018-06-22 VITALS — BP 122/78 | HR 62 | Ht 63.0 in | Wt 133.2 lb

## 2018-06-22 DIAGNOSIS — D869 Sarcoidosis, unspecified: Secondary | ICD-10-CM | POA: Insufficient documentation

## 2018-06-22 DIAGNOSIS — R9389 Abnormal findings on diagnostic imaging of other specified body structures: Secondary | ICD-10-CM | POA: Diagnosis not present

## 2018-06-22 NOTE — H&P (View-Only) (Signed)
Subjective:    Patient ID: TARSHA BLANDO, female    DOB: 1934/01/25, 84 y.o.   MRN: [id number]  HPI Mrs. [name] is an 84 year old never smoker who has been followed by Dr. Marin Olp for diffuse large cell non-Hodgkin's lymphoma, treated remotely and in remission.  She also has nonischemic cardiomyopathy (EF 20 to 25%), hypertension, hypothyroidism.  She is referred today for evaluation of an abnormal CT scan of the chest and abnormal PET scan.  She tells me she has been well until she developed neck pain just over a month ago. She was evaluated at [facility] where a CT neck 05/24/18 showed a LUL cavitary mass. This prompted a CT chest 05/24/18 and then a PET 06/06/18 both of which I have personally reviewed. The mass and the bilateral hilar nodes are hypermetabolic.   She denies any CP, cough, dyspnea. She has had some dizziness at times, often associated with her Imdur. No sputum, no hemoptysis. She has not had any edema, so she has not been using her prn lasix.   She remains active, participates and hiking club, participates in [employer].   Review of Systems  Constitutional: Negative for fever and unexpected weight change.  HENT: Negative for congestion, dental problem, ear pain, nosebleeds, postnasal drip, rhinorrhea, sinus pressure, sneezing, sore throat and trouble swallowing.   Eyes: Negative for redness and itching.  Respiratory: Negative for cough, chest tightness, shortness of breath and wheezing.   Cardiovascular: Negative for palpitations and leg swelling.  Gastrointestinal: Negative for nausea and vomiting.  Genitourinary: Negative for dysuria.  Musculoskeletal: Negative for joint swelling.  Skin: Negative for rash.  Allergic/Immunologic: Negative.  Negative for environmental allergies, food allergies and immunocompromised state.  Neurological: Negative for headaches.  Hematological: Does not bruise/bleed easily.  Psychiatric/Behavioral: Negative for dysphoric mood. The patient is  not nervous/anxious.    Past Medical History:  Diagnosis Date  . Anemia 08/21/2017  . Anxiety   . Cancer ([place]) 11/17/1999   non hodgkins lymphoma-Dr. Marin Olp  . Cardiomyopathy    non ischemic NL cos on cath 08/2009, EF to 15-20%, Her follow up  2D echo  in 10/2009 showed impoved EFo 35-40%  . CHF (congestive heart failure) ([place])   . History of lymphoma    Dr Martha Clan  . Hypercalcemia 04/20/2017  . Hypertension   . Hypothyroid   . Medicare annual wellness visit, subsequent 12/14/2014   Follows with Dr Marin Olp Follows with Dr Deatra Ina, gastroenterology, last colonoscopy in 2012, repeat in 2017 Last Pap 2011, always normal, no need for repeat Last MGM in 2011, no concerns, declines for now Follows with Dr Stanford Breed of cardiology   . Vitamin D deficiency 10/17/2015     Family History  Problem Relation Age of Onset  . Diabetes Mother        deceased secondary to diabetes  . Hypertension Mother   . Vision loss Mother   . Pneumonia Father        died age 41 due to complications of pneumonia  . Colon polyps Father   . Liver disease Daughter   . Sarcoidosis Daughter   . Diabetes Sister   . Colon cancer Brother 47  . Cancer Brother   . Obesity Son   . Diabetes Brother   . Kidney disease Brother   . Diabetes Sister   . Sarcoidosis Sister   . Arthritis Sister   . Arthritis Sister   . Diabetes Sister   . Arthritis Sister   . Diabetes Sister   .  Hypertension Daughter   . Hypertension Daughter   . Other Unknown        no obvious premature cardiovascular disease or familial cardiomyopathy is noted  . Alcohol abuse Neg Hx   . Heart disease Neg Hx      Social History   Socioeconomic History  . Marital status: Married    Spouse name: Not on file  . Number of children: Not on file  . Years of education: Not on file  . Highest education level: Not on file  Occupational History  . Not on file  Social Needs  . Financial resource strain: Not on file  . Food insecurity:    Worry: Not  on file    Inability: Not on file  . Transportation needs:    Medical: Not on file    Non-medical: Not on file  Tobacco Use  . Smoking status: Never Smoker  . Smokeless tobacco: Never Used  . Tobacco comment: never used tobacco  Substance and Sexual Activity  . Alcohol use: No    Alcohol/week: 0.0 oz  . Drug use: No  . Sexual activity: Not on file    Comment: lives with husband, no dietary restrictions, minimizes dairy  Lifestyle  . Physical activity:    Days per week: Not on file    Minutes per session: Not on file  . Stress: Not on file  Relationships  . Social connections:    Talks on phone: Not on file    Gets together: Not on file    Attends religious service: Not on file    Active member of club or organization: Not on file    Attends meetings of clubs or organizations: Not on file    Relationship status: Not on file  . Intimate partner violence:    Fear of current or ex partner: Not on file    Emotionally abused: Not on file    Physically abused: Not on file    Forced sexual activity: Not on file  Other Topics Concern  . Not on file  Social History Narrative   The patient is married ( husband Keniesha Adderly)  She just recently celebrated     her 52nd anniversary.  She has five children.  There is no active     tobacco or alcohol use history.          Smoking Status:  never   Caffeine use/day:  None   Does Patient Exercise:  yes     Allergies  Allergen Reactions  . Lisinopril Swelling    Facial and lip swelling  . Iohexol      Desc: hives,itiching      Outpatient Medications Prior to Visit  Medication Sig Dispense Refill  . carvedilol (COREG) 6.25 MG tablet Take 1 tablet (6.25 mg total) by mouth 2 (two) times daily. 180 tablet 3  . diazepam (VALIUM) 2 MG tablet Take 2.5 tablets (5 mg total) by mouth every 8 (eight) hours as needed for muscle spasms. 15 tablet 0  . furosemide (LASIX) 20 MG tablet Take 1 tablet (20 mg total) by mouth daily as needed for fluid  or edema (do not take if blood pressure is  low or any vomiting or diarrhea). 90 tablet 1  . hydrALAZINE (APRESOLINE) 10 MG tablet TAKE 1 TABLET (10 MG TOTAL) BY MOUTH 3 (THREE) TIMES DAILY. 270 tablet 3  . HYDROcodone-acetaminophen (NORCO/VICODIN) 5-325 MG tablet Take 1 tablet by mouth every 6 (six) hours as needed for moderate pain.  8 tablet 0  . isosorbide mononitrate (IMDUR) 30 MG 24 hr tablet Take 0.5 tablets (15 mg total) by mouth daily. 45 tablet 3  . levothyroxine (SYNTHROID, LEVOTHROID) 25 MCG tablet Take 1 tablet (25 mcg total) by mouth daily before breakfast. 30 tablet 3  . levothyroxine (SYNTHROID, LEVOTHROID) 25 MCG tablet TAKE 1 TABLET(25 MCG) BY MOUTH DAILY 30 tablet 0  . Multiple Vitamins-Minerals (CENTRUM PO) Take 1 tablet by mouth daily.     . Omega-3 Fatty Acids (FISH OIL PO) Take 1 tablet by mouth daily.    . Probiotic Product (PROBIOTIC DAILY PO) Take by mouth.     Facility-Administered Medications Prior to Visit  Medication Dose Route Frequency Provider Last Rate Last Dose  . 0.9 %  sodium chloride infusion  500 mL Intravenous Once Mauri Pole, MD            Objective:   Physical Exam  Vitals:   06/22/18 1326  BP: 122/78  Pulse: 62  SpO2: 99%  Weight: 133 lb 3.2 oz (60.4 kg)  Height: 5\' 3"  (1.6 m)   Gen: Pleasant, well-nourished, in no distress,  normal affect  ENT: No lesions,  mouth clear,  oropharynx clear, no postnasal drip  Neck: No JVD, no stridor  Lungs: No use of accessory muscles, clear b   Cardiovascular: RRR, heart sounds normal, no murmur or gallops, no peripheral edema  Musculoskeletal: No deformities, no cyanosis or clubbing  Neuro: alert, non focal  Skin: Warm, no lesions or rashes  CT chest 05/24/18 -  FINDINGS: CT CHEST FINDINGS  Cardiovascular: Atherosclerosis of thoracic aorta is noted without aneurysm formation. Normal cardiac size. No pericardial effusion.  Mediastinum/Nodes: Stable calcified nodule seen in left  thyroid lobe. The esophagus is unremarkable. Evaluation of adenopathy is limited due to lack of intravenous contrast. 3.4 x 1.3 cm lymph node is noted in aortopulmonary window which appears to be enlarged compared to prior exam. Subcarinal adenopathy measuring 4.9 x 1.9 cm is noted with calcifications. 15 mm right hilar lymph node is noted with central calcification. 19 mm left infrahilar lymph node is noted with calcifications.  Lungs/Pleura: No pneumothorax or pleural effusion is noted. 11 x 9 mm spiculated nodule is noted in right lower lobe best seen on image number 64 series 3 consistent with malignancy. Nodular densities are noted in the right major fissure near by concerning for metastatic disease. 3.9 x 2.9 cm cavitary lesion is seen in left lung apex concerning for malignancy. 9 x 4 mm pleural based mass is noted laterally in the left lower lobe best seen on image number 78 of series 3.  Musculoskeletal: No chest wall mass or suspicious bone lesions Identified.   PET scan 06/06/18 --  COMPARISON:  CT scans 05/24/2018  FINDINGS: Mediastinal blood pool activity: SUV max 2.43  NECK: No hypermetabolic lymph nodes in the neck.  Incidental CT findings: Calcified left thyroid nodule noted.  CHEST: No supraclavicular or axillary lymphadenopathy.  4 cm necrotic/cavitary left apical lung mass is hypermetabolic with SUV max of 01.09. Bulky hypermetabolic mediastinal and bilateral hilar adenopathy with SUV max of approximately 14.5. Some of these nodes appear to be faintly calcified.  Superior segment right lower lobe pulmonary nodule measuring 10 mm is hypermetabolic with SUV max of 4.1.  8 mm nodule along the right major fissure in the right upper is hypermetabolic with SUV max of 3.2 to. Adjacent smaller nodules are not definitely hypermetabolic and may represent lymph nodes. Subpleural lesion in the  left lower lobe is weakly FDG positive with SUV max of  2.38.  Incidental CT findings: none  ABDOMEN/PELVIS: Mild nodular thickening of the medial limb left adrenal gland is mildly hypermetabolic with SUV max of 0.27 and is indeterminate. Close follow-up suggested. The right adrenal gland is normal. No findings for hepatic metastatic disease or abdominal/pelvic metastatic adenopathy. No inguinal adenopathy.  Two areas of hypermetabolism are noted involving the right colon. This appears to correlate with 2 areas of uncomplicated diverticulitis. No masses identified.  Incidental CT findings: Bilateral renal calculi with large calculus in the lower pole region right kidney. The right kidney demonstrates scarring changes. Moderate atherosclerotic calcifications involving the aorta and iliac arteries.  SKELETON: No focal hypermetabolic activity to suggest skeletal metastasis.  Incidental CT findings: none  IMPRESSION: 1. 4 cm cavitary left apical lung mass is hypermetabolic and most consistent with primary lung neoplasm, likely squamous cell carcinoma. I do not see any obvious chest wall invasion. 2. Bulky symmetric hilar adenopathy is markedly hypermetabolic. There is also bilateral mediastinal adenopathy. The adenopathy shows calcification which would be unusual for metastatic disease. With history of prior lymphoma I suppose it is possible this is recurrent lymphoma with calcifications related to prior treatment. Another possibility would be underlying sarcoidosis. Patient probably needs biopsy of the left apical lesion and sampling of the adenopathy. 3. Right-sided pulmonary nodules are hypermetabolic and could reflect metastatic disease. 4. Equivocal left adrenal gland lesion but suspicious for metastatic disease. 5. Two areas of uncomplicated diverticulitis involving the right colon.       Assessment & Plan:  Abnormal CT of the chest Left upper lobe cavitary mass that is hypermetabolic on PET scan, hypermetabolic  mediastinal and hilar lymphadenopathy.  The entire clinical picture is concerning for primary lung cancer.  I suppose recurrent lymphoma is possible but would seem quite unusual after so many years in remission.  I believe the best approach would be bronchoscopy with interbronchial ultrasound, nodal biopsies and possibly navigation to the left upper lobe mass as well.  I discussed this with her today.  She understands the procedure.  All questions answered.  She agrees to proceed.  She has nonischemic cardiomyopathy but is well compensated at this time.  I believe that she can tolerate anesthesia.  We will try to arrange within the week.  She will need a repeat SuperD CT scan without contrast to allow navigation.  Baltazar Apo, MD, PhD 06/22/2018, 2:22 PM [facility] [phone] or if no answer [phone]

## 2018-06-22 NOTE — Assessment & Plan Note (Signed)
Left upper lobe cavitary mass that is hypermetabolic on PET scan, hypermetabolic mediastinal and hilar lymphadenopathy.  The entire clinical picture is concerning for primary lung cancer.  I suppose recurrent lymphoma is possible but would seem quite unusual after so many years in remission.  I believe the best approach would be bronchoscopy with interbronchial ultrasound, nodal biopsies and possibly navigation to the left upper lobe mass as well.  I discussed this with her today.  She understands the procedure.  All questions answered.  She agrees to proceed.  She has nonischemic cardiomyopathy but is well compensated at this time.  I believe that she can tolerate anesthesia.  We will try to arrange within the week.  She will need a repeat SuperD CT scan without contrast to allow navigation.

## 2018-06-22 NOTE — Patient Instructions (Addendum)
We discussed your abnormal scans today. There are abnormalities in the left lung and in your central lymph nodes that are suspicious for possible cancer.  We will arrange for a biopsy of these areas by bronchoscopy. We will arrange for a repeat CT scan of your chest prior to your planned procedure. Follow with Dr Lamonte Sakai next available opening after the procedure is completed.

## 2018-06-22 NOTE — Progress Notes (Signed)
   Subjective:    Patient ID: Crystal [name], female    DOB: [birth year], 84 y.o.   MRN: [id number]  HPI    Review of Systems     Objective:   Physical Exam        Assessment & Plan:

## 2018-06-22 NOTE — Progress Notes (Signed)
Subjective:    Patient ID: Crystal [name], female    DOB: [birth year], 84 y.o.   MRN: [id number]  HPI Mrs. Crystal [name] is an 84 year old never smoker who has been followed by Dr. Marin Olp for diffuse large cell non-Hodgkin's lymphoma, treated remotely and in remission.  She also has nonischemic cardiomyopathy (EF 20 to 25%), hypertension, hypothyroidism.  She is referred today for evaluation of an abnormal CT scan of the chest and abnormal PET scan.  She tells me she has been well until she developed neck pain just over a month ago. She was evaluated at [facility] where a CT neck 05/24/18 showed a LUL cavitary mass. This prompted a CT chest 05/24/18 and then a PET 06/06/18 both of which I have personally reviewed. The mass and the bilateral hilar nodes are hypermetabolic.   She denies any CP, cough, dyspnea. She has had some dizziness at times, often associated with her Imdur. No sputum, no hemoptysis. She has not had any edema, so she has not been using her prn lasix.   She remains active, participates and hiking club, participates in [employer].   Review of Systems  Constitutional: Negative for fever and unexpected weight change.  HENT: Negative for congestion, dental problem, ear pain, nosebleeds, postnasal drip, rhinorrhea, sinus pressure, sneezing, sore throat and trouble swallowing.   Eyes: Negative for redness and itching.  Respiratory: Negative for cough, chest tightness, shortness of breath and wheezing.   Cardiovascular: Negative for palpitations and leg swelling.  Gastrointestinal: Negative for nausea and vomiting.  Genitourinary: Negative for dysuria.  Musculoskeletal: Negative for joint swelling.  Skin: Negative for rash.  Allergic/Immunologic: Negative.  Negative for environmental allergies, food allergies and immunocompromised state.  Neurological: Negative for headaches.  Hematological: Does not bruise/bleed easily.  Psychiatric/Behavioral: Negative for dysphoric mood. The patient is  not nervous/anxious.    Past Medical History:  Diagnosis Date  . Anemia 08/21/2017  . Anxiety   . Cancer ([place]) 11/17/1999   non hodgkins lymphoma-Dr. Marin Olp  . Cardiomyopathy    non ischemic NL cos on cath 08/2009, EF to 15-20%, Her follow up  2D echo  in 10/2009 showed impoved EFo 35-40%  . CHF (congestive heart failure) ([place])   . History of lymphoma    Dr Martha Clan  . Hypercalcemia 04/20/2017  . Hypertension   . Hypothyroid   . Medicare annual wellness visit, subsequent 12/14/2014   Follows with Dr Marin Olp Follows with Dr Deatra Ina, gastroenterology, last colonoscopy in 2012, repeat in 2017 Last Pap 2011, always normal, no need for repeat Last MGM in 2011, no concerns, declines for now Follows with Dr Stanford Breed of cardiology   . Vitamin D deficiency 10/17/2015     Family History  Problem Relation Age of Onset  . Diabetes Mother        deceased secondary to diabetes  . Hypertension Mother   . Vision loss Mother   . Pneumonia Father        died age 21 due to complications of pneumonia  . Colon polyps Father   . Liver disease Daughter   . Sarcoidosis Daughter   . Diabetes Sister   . Colon cancer Brother 37  . Cancer Brother   . Obesity Son   . Diabetes Brother   . Kidney disease Brother   . Diabetes Sister   . Sarcoidosis Sister   . Arthritis Sister   . Arthritis Sister   . Diabetes Sister   . Arthritis Sister   . Diabetes Sister   .  Hypertension Daughter   . Hypertension Daughter   . Other Unknown        no obvious premature cardiovascular disease or familial cardiomyopathy is noted  . Alcohol abuse Neg Hx   . Heart disease Neg Hx      Social History   Socioeconomic History  . Marital status: Married    Spouse name: Not on file  . Number of children: Not on file  . Years of education: Not on file  . Highest education level: Not on file  Occupational History  . Not on file  Social Needs  . Financial resource strain: Not on file  . Food insecurity:    Worry: Not  on file    Inability: Not on file  . Transportation needs:    Medical: Not on file    Non-medical: Not on file  Tobacco Use  . Smoking status: Never Smoker  . Smokeless tobacco: Never Used  . Tobacco comment: never used tobacco  Substance and Sexual Activity  . Alcohol use: No    Alcohol/week: 0.0 oz  . Drug use: No  . Sexual activity: Not on file    Comment: lives with husband, no dietary restrictions, minimizes dairy  Lifestyle  . Physical activity:    Days per week: Not on file    Minutes per session: Not on file  . Stress: Not on file  Relationships  . Social connections:    Talks on phone: Not on file    Gets together: Not on file    Attends religious service: Not on file    Active member of club or organization: Not on file    Attends meetings of clubs or organizations: Not on file    Relationship status: Not on file  . Intimate partner violence:    Fear of current or ex partner: Not on file    Emotionally abused: Not on file    Physically abused: Not on file    Forced sexual activity: Not on file  Other Topics Concern  . Not on file  Social History Narrative   The patient is married ( husband Crystal [name])  She just recently celebrated     her 52nd anniversary.  She has five children.  There is no active     tobacco or alcohol use history.          Smoking Status:  never   Caffeine use/day:  None   Does Patient Exercise:  yes     Allergies  Allergen Reactions  . Lisinopril Swelling    Facial and lip swelling  . Iohexol      Desc: hives,itiching      Outpatient Medications Prior to Visit  Medication Sig Dispense Refill  . carvedilol (COREG) 6.25 MG tablet Take 1 tablet (6.25 mg total) by mouth 2 (two) times daily. 180 tablet 3  . diazepam (VALIUM) 2 MG tablet Take 2.5 tablets (5 mg total) by mouth every 8 (eight) hours as needed for muscle spasms. 15 tablet 0  . furosemide (LASIX) 20 MG tablet Take 1 tablet (20 mg total) by mouth daily as needed for fluid  or edema (do not take if blood pressure is  low or any vomiting or diarrhea). 90 tablet 1  . hydrALAZINE (APRESOLINE) 10 MG tablet TAKE 1 TABLET (10 MG TOTAL) BY MOUTH 3 (THREE) TIMES DAILY. 270 tablet 3  . HYDROcodone-acetaminophen (NORCO/VICODIN) 5-325 MG tablet Take 1 tablet by mouth every 6 (six) hours as needed for moderate pain.  8 tablet 0  . isosorbide mononitrate (IMDUR) 30 MG 24 hr tablet Take 0.5 tablets (15 mg total) by mouth daily. 45 tablet 3  . levothyroxine (SYNTHROID, LEVOTHROID) 25 MCG tablet Take 1 tablet (25 mcg total) by mouth daily before breakfast. 30 tablet 3  . levothyroxine (SYNTHROID, LEVOTHROID) 25 MCG tablet TAKE 1 TABLET(25 MCG) BY MOUTH DAILY 30 tablet 0  . Multiple Vitamins-Minerals (CENTRUM PO) Take 1 tablet by mouth daily.     . Omega-3 Fatty Acids (FISH OIL PO) Take 1 tablet by mouth daily.    . Probiotic Product (PROBIOTIC DAILY PO) Take by mouth.     Facility-Administered Medications Prior to Visit  Medication Dose Route Frequency Provider Last Rate Last Dose  . 0.9 %  sodium chloride infusion  500 mL Intravenous Once Mauri Pole, MD            Objective:   Physical Exam  Vitals:   06/22/18 1326  BP: 122/78  Pulse: 62  SpO2: 99%  Weight: 133 lb 3.2 oz (60.4 kg)  Height: 5\' 3"  (1.6 m)   Gen: Pleasant, well-nourished, in no distress,  normal affect  ENT: No lesions,  mouth clear,  oropharynx clear, no postnasal drip  Neck: No JVD, no stridor  Lungs: No use of accessory muscles, clear b   Cardiovascular: RRR, heart sounds normal, no murmur or gallops, no peripheral edema  Musculoskeletal: No deformities, no cyanosis or clubbing  Neuro: alert, non focal  Skin: Warm, no lesions or rashes  CT chest 05/24/18 -  FINDINGS: CT CHEST FINDINGS  Cardiovascular: Atherosclerosis of thoracic aorta is noted without aneurysm formation. Normal cardiac size. No pericardial effusion.  Mediastinum/Nodes: Stable calcified nodule seen in left  thyroid lobe. The esophagus is unremarkable. Evaluation of adenopathy is limited due to lack of intravenous contrast. 3.4 x 1.3 cm lymph node is noted in aortopulmonary window which appears to be enlarged compared to prior exam. Subcarinal adenopathy measuring 4.9 x 1.9 cm is noted with calcifications. 15 mm right hilar lymph node is noted with central calcification. 19 mm left infrahilar lymph node is noted with calcifications.  Lungs/Pleura: No pneumothorax or pleural effusion is noted. 11 x 9 mm spiculated nodule is noted in right lower lobe best seen on image number 64 series 3 consistent with malignancy. Nodular densities are noted in the right major fissure near by concerning for metastatic disease. 3.9 x 2.9 cm cavitary lesion is seen in left lung apex concerning for malignancy. 9 x 4 mm pleural based mass is noted laterally in the left lower lobe best seen on image number 78 of series 3.  Musculoskeletal: No chest wall mass or suspicious bone lesions Identified.   PET scan 06/06/18 --  COMPARISON:  CT scans 05/24/2018  FINDINGS: Mediastinal blood pool activity: SUV max 2.43  NECK: No hypermetabolic lymph nodes in the neck.  Incidental CT findings: Calcified left thyroid nodule noted.  CHEST: No supraclavicular or axillary lymphadenopathy.  4 cm necrotic/cavitary left apical lung mass is hypermetabolic with SUV max of 41.96. Bulky hypermetabolic mediastinal and bilateral hilar adenopathy with SUV max of approximately 14.5. Some of these nodes appear to be faintly calcified.  Superior segment right lower lobe pulmonary nodule measuring 10 mm is hypermetabolic with SUV max of 4.1.  8 mm nodule along the right major fissure in the right upper is hypermetabolic with SUV max of 3.2 to. Adjacent smaller nodules are not definitely hypermetabolic and may represent lymph nodes. Subpleural lesion in the  left lower lobe is weakly FDG positive with SUV max of  2.38.  Incidental CT findings: none  ABDOMEN/PELVIS: Mild nodular thickening of the medial limb left adrenal gland is mildly hypermetabolic with SUV max of 7.09 and is indeterminate. Close follow-up suggested. The right adrenal gland is normal. No findings for hepatic metastatic disease or abdominal/pelvic metastatic adenopathy. No inguinal adenopathy.  Two areas of hypermetabolism are noted involving the right colon. This appears to correlate with 2 areas of uncomplicated diverticulitis. No masses identified.  Incidental CT findings: Bilateral renal calculi with large calculus in the lower pole region right kidney. The right kidney demonstrates scarring changes. Moderate atherosclerotic calcifications involving the aorta and iliac arteries.  SKELETON: No focal hypermetabolic activity to suggest skeletal metastasis.  Incidental CT findings: none  IMPRESSION: 1. 4 cm cavitary left apical lung mass is hypermetabolic and most consistent with primary lung neoplasm, likely squamous cell carcinoma. I do not see any obvious chest wall invasion. 2. Bulky symmetric hilar adenopathy is markedly hypermetabolic. There is also bilateral mediastinal adenopathy. The adenopathy shows calcification which would be unusual for metastatic disease. With history of prior lymphoma I suppose it is possible this is recurrent lymphoma with calcifications related to prior treatment. Another possibility would be underlying sarcoidosis. Patient probably needs biopsy of the left apical lesion and sampling of the adenopathy. 3. Right-sided pulmonary nodules are hypermetabolic and could reflect metastatic disease. 4. Equivocal left adrenal gland lesion but suspicious for metastatic disease. 5. Two areas of uncomplicated diverticulitis involving the right colon.       Assessment & Plan:  Abnormal CT of the chest Left upper lobe cavitary mass that is hypermetabolic on PET scan, hypermetabolic  mediastinal and hilar lymphadenopathy.  The entire clinical picture is concerning for primary lung cancer.  I suppose recurrent lymphoma is possible but would seem quite unusual after so many years in remission.  I believe the best approach would be bronchoscopy with interbronchial ultrasound, nodal biopsies and possibly navigation to the left upper lobe mass as well.  I discussed this with her today.  She understands the procedure.  All questions answered.  She agrees to proceed.  She has nonischemic cardiomyopathy but is well compensated at this time.  I believe that she can tolerate anesthesia.  We will try to arrange within the week.  She will need a repeat SuperD CT scan without contrast to allow navigation.  Baltazar Apo, MD, PhD 06/22/2018, 2:22 PM [facility] [phone] or if no answer [phone]

## 2018-06-24 ENCOUNTER — Ambulatory Visit (INDEPENDENT_AMBULATORY_CARE_PROVIDER_SITE_OTHER)
Admission: RE | Admit: 2018-06-24 | Discharge: 2018-06-24 | Disposition: A | Discharge status: Home / Self Care | Payer: Medicare Other | Source: Ambulatory Visit | Attending: Emergency Medicine | Admitting: Emergency Medicine

## 2018-06-24 ENCOUNTER — Telehealth: Payer: Self-pay | Admitting: Emergency Medicine

## 2018-06-24 ENCOUNTER — Other Ambulatory Visit: Payer: Self-pay

## 2018-06-24 ENCOUNTER — Encounter (HOSPITAL_COMMUNITY): Payer: Self-pay

## 2018-06-24 ENCOUNTER — Encounter (HOSPITAL_COMMUNITY)
Admission: RE | Admit: 2018-06-24 | Discharge: 2018-06-24 | Disposition: A | Discharge status: Home / Self Care | Payer: Medicare Other | Source: Ambulatory Visit | Attending: Emergency Medicine | Admitting: Emergency Medicine

## 2018-06-24 DIAGNOSIS — Z01810 Encounter for preprocedural cardiovascular examination: Secondary | ICD-10-CM | POA: Diagnosis not present

## 2018-06-24 DIAGNOSIS — R9431 Abnormal electrocardiogram [ECG] [EKG]: Secondary | ICD-10-CM | POA: Insufficient documentation

## 2018-06-24 DIAGNOSIS — C801 Malignant (primary) neoplasm, unspecified: Secondary | ICD-10-CM | POA: Diagnosis not present

## 2018-06-24 DIAGNOSIS — R9389 Abnormal findings on diagnostic imaging of other specified body structures: Secondary | ICD-10-CM | POA: Diagnosis not present

## 2018-06-24 DIAGNOSIS — Z01812 Encounter for preprocedural laboratory examination: Secondary | ICD-10-CM | POA: Diagnosis not present

## 2018-06-24 DIAGNOSIS — C7800 Secondary malignant neoplasm of unspecified lung: Secondary | ICD-10-CM | POA: Diagnosis not present

## 2018-06-24 DIAGNOSIS — I10 Essential (primary) hypertension: Secondary | ICD-10-CM | POA: Diagnosis not present

## 2018-06-24 HISTORY — DX: Other nonspecific abnormal finding of lung field: R91.8

## 2018-06-24 HISTORY — DX: Presence of spectacles and contact lenses: Z97.3

## 2018-06-24 HISTORY — DX: Presence of dental prosthetic device (complete) (partial): Z97.2

## 2018-06-24 HISTORY — DX: Complete loss of teeth, unspecified cause, unspecified class: K08.109

## 2018-06-24 LAB — CBC
HCT: 36 % (ref 36.0–46.0)
Hemoglobin: 11.2 g/dL — ABNORMAL LOW (ref 12.0–15.0)
MCH: 29.2 pg (ref 26.0–34.0)
MCHC: 31.1 g/dL (ref 30.0–36.0)
MCV: 93.8 fL (ref 78.0–100.0)
PLATELETS: 134 10*3/uL — AB (ref 150–400)
RBC: 3.84 MIL/uL — ABNORMAL LOW (ref 3.87–5.11)
RDW: 12.8 % (ref 11.5–15.5)
WBC: 3.3 10*3/uL — ABNORMAL LOW (ref 4.0–10.5)

## 2018-06-24 LAB — BASIC METABOLIC PANEL
Anion gap: 6 (ref 5–15)
BUN: 13 mg/dL (ref 8–23)
CALCIUM: 9.5 mg/dL (ref 8.9–10.3)
CO2: 30 mmol/L (ref 22–32)
CREATININE: 1.28 mg/dL — AB (ref 0.44–1.00)
Chloride: 104 mmol/L (ref 98–111)
GFR, EST AFRICAN AMERICAN: 44 mL/min — AB (ref >60)
GFR, EST NON AFRICAN AMERICAN: 38 mL/min — AB (ref >60)
GLUCOSE: 108 mg/dL — AB (ref 70–99)
Potassium: 4.1 mmol/L (ref 3.5–5.1)
Sodium: 140 mmol/L (ref 135–145)

## 2018-06-24 NOTE — Telephone Encounter (Signed)
Done

## 2018-06-24 NOTE — Pre-Procedure Instructions (Signed)
   Crystal [name]  06/24/2018     [place] [address] Phone: [phone] Fax: [phone]   Your procedure is scheduled on Wednesday, June 29, 2018  Report to [facility] Admitting at 6:30 A.M.  Call this number if you have problems the morning of surgery:  [phone]   Remember:  Do not eat or drink after midnight Tuesday, June 28, 2018  Take these medicines the morning of surgery with A SIP OF WATER: carvedilol (COREG), hydrALAZINE (APRESOLINE), isosorbide mononitrate (IMDUR), levothyroxine (SYNTHROID, LEVOTHROID)  if needed: raNITIdine HCl (ACID REDUCER )  Stop taking vitamins, fish oil, Probiotics and herbal medications. Do not take any NSAIDs ie: Ibuprofen, Advil, Naproxen (Aleve), Motrin, BC and Goody Powder; stop now.  Do not wear jewelry, make-up or nail polish.  Do not wear lotions, powders, or perfumes, or deodorant.  Do not shave 48 hours prior to surgery.    Do not bring valuables to the hospital.  [facility] is not responsible for any belongings or valuables.  Contacts, dentures or bridgework may not be worn into surgery.  Leave your suitcase in the car.  After surgery it may be brought to your room. Patients discharged the day of surgery will not be allowed to drive home.  Special instructions: Shower the night before surgery and the morning of surgery with CHG; see instruction sheet. Please read over the following fact sheets that you were given. Pain Booklet, Coughing and Deep Breathing and Surgical Site Infection Prevention

## 2018-06-24 NOTE — Telephone Encounter (Signed)
Will route message to RB so he may put these orders in.

## 2018-06-24 NOTE — Progress Notes (Signed)
Pt denies SOB and chest pain. Pt stated that she is under the care of Dr. Stanford Breed, Cardiology. Pt denies having a stress test and cardiac cath. Pt denies having an EKG and chest x ray within the last year. Pt chart forwarded to anesthesia for review.

## 2018-06-27 NOTE — Progress Notes (Signed)
Anesthesia Chart Review:  Case:  [id number] Date/Time:  06/29/18 0815   Procedures:      VIDEO BRONCHOSCOPY WITH ENDOBRONCHIAL ULTRASOUND (Right )     VIDEO BRONCHOSCOPY WITH ENDOBRONCHIAL NAVIGATION (Right )   Anesthesia type:  General   Pre-op diagnosis:  LUNG MASSRIGHT LOWER LOBE   Location:  [facility] OR ROOM 10 / [place] [facility]   Surgeon:  Collene Gobble, MD      DISCUSSION: Patient is an 84 year old female scheduled for the above procedure. History includes never smoker, non-ischemic cardiomyopathy (normal coronaries, EF 20-25% '10; EF 40-45% 05/2017), HTN, diffuse large cell non-Hodgkin's lymphoma (> 15 years ago), hypothyroidism, hypercalcemia, anemia. History of angioedema with ace inhibitor. She has refused ICD (lastest EF 40-45% '18).   She has a LUL cavity mass that is hypermetabolic. Findings are suspicious for primary lung cancer, although recurrent lymphoma cannot be ruled out.  The above procedure is needed in order to obtain a definitive diagnosis and further direct management.  Last cardiology visit was 12/23/17. Volume status was good at that time.  She denies shortness of breath and chest pain and PAT.  If no acute changes and I would anticipate that she can proceed as planned.   VS: BP 114/68   Pulse 80   Temp 36.7 C   Resp 18   Ht 5\' 3"  (1.6 m)   Wt 61.1 kg   SpO2 96%   BMI 23.86 kg/m   PROVIDERS: Mosie Lukes, MD is PCP. Burney Gauze, MD is HEM-ONC. Last visit 06/17/18. [name], MD is cardiologist. Last visit 12/23/17.  Patrcia Dolly, MD is GI.   LABS: Labs reviewed: Acceptable for surgery. (all labs ordered are listed, but only abnormal results are displayed)  Labs Reviewed  BASIC METABOLIC PANEL - Abnormal; Notable for the following components:      Result Value   Glucose, Bld 108 (*)    Creatinine, Ser 1.28 (*)    GFR calc non Af Amer 38 (*)    GFR calc Af Amer 44 (*)    All other components within normal limits  CBC - Abnormal; Notable for the  following components:   WBC 3.3 (*)    RBC 3.84 (*)    Hemoglobin 11.2 (*)    Platelets 134 (*)    All other components within normal limits     IMAGES:  CT Super D Chest 06/24/18: IMPRESSION: - 4.2 cm cavitary left apical mass, suspicious for primary bronchogenic neoplasm. Associated direct pleural extension in the posterior left upper lobe. - Suspected pleural-based metastases bilaterally. Additional pulmonary metastasis in the right lower lobe. - Bulky mediastinal and bilateral hilar lymphadenopathy, unusual given the partially calcified appearance, but nodal metastases cannot be excluded. - Mild/faint ground-glass opacity in the right upper lobe, nonspecific, possibly reflecting mild infection. - Aortic Atherosclerosis (ICD10-I70.0).  PET scan 06/06/18: IMPRESSION: 1. 4 cm cavitary left apical lung mass is hypermetabolic and most consistent with primary lung neoplasm, likely squamous cell carcinoma. I do not see any obvious chest wall invasion. 2. Bulky symmetric hilar adenopathy is markedly hypermetabolic. There is also bilateral mediastinal adenopathy. The adenopathy shows calcification which would be unusual for metastatic disease. With history of prior lymphoma I suppose it is possible this is recurrent lymphoma with calcifications related to prior treatment. Another possibility would be underlying sarcoidosis. Patient probably needs biopsy of the left apical lesion and sampling of the adenopathy. 3. Right-sided pulmonary nodules are hypermetabolic and could reflect metastatic disease.  4. Equivocal left adrenal gland lesion but suspicious for metastatic disease. 5. Two areas of uncomplicated diverticulitis involving the right colon.   EKG: 06/24/18: NSR, LAD, non-specific intra-ventricular conduction delay.  Minimal voltage criteria for LVH, may be normal variant.  Comparison tracing from 06/03/17 ([facility]) showed sinus rhythm with occasional PVCs, right bundle  branch block, left anterior fascicular block, bifascicular block, LVH with repolarization abnormality.  01/19/16 tracing showed left BBB with bigeminy PVCs.   CV: Echo 06/11/17: Study Conclusions - Left ventricle: The cavity size was normal. Systolic function was   mildly to moderately reduced. The estimated ejection fraction was   in the range of 40% to 45%. Wall motion was normal; there were no   regional wall motion abnormalities. There was an increased   relative contribution of atrial contraction to ventricular   filling. Doppler parameters are consistent with abnormal left   ventricular relaxation (grade 1 diastolic dysfunction). - Aortic valve: Trileaflet. Moderate focal calcification involving   the noncoronary cusp. - Mitral valve: Mild diffuse thickening of the anterior leaflet.   There was mild regurgitation. - Tricuspid valve: There was trivial regurgitation. - Pulmonic valve: There was trivial regurgitation. (Comparison EF 20-25% 11/21/15)  Cardiac cath 09/10/09: IMPRESSION: 1.  No angiographic evidence of coronary artery disease. 2.  Severe global left ventricular systolic dysfunction. LVEF 15-20%. 3.  Nonischemic cardiomyopathy. Recommendations: Continue current medical therapy.   Past Medical History:  Diagnosis Date  . Anemia 08/21/2017  . Anxiety   . Cancer ([place]) 11/17/1999   non hodgkins lymphoma-Dr. Marin Olp  . Cardiomyopathy    non ischemic NL cos on cath 08/2009, EF to 15-20%, Her follow up  2D echo  in 10/2009 showed impoved EFo 35-40%  . CHF (congestive heart failure) ([place])   . Full dentures   . History of lymphoma    Dr Martha Clan  . Hypercalcemia 04/20/2017  . Hypertension   . Hypothyroid   . Lung mass   . Medicare annual wellness visit, subsequent 12/14/2014   Follows with Dr Marin Olp Follows with Dr Deatra Ina, gastroenterology, last colonoscopy in 2012, repeat in 2017 Last Pap 2011, always normal, no need for repeat Last MGM in 2011, no concerns, declines  for now Follows with Dr Stanford Breed of cardiology   . Vitamin D deficiency 10/17/2015  . Wears glasses     Past Surgical History:  Procedure Laterality Date  . DILATION AND CURETTAGE OF UTERUS    . nasal polyps removed     age 59  . teeth extraction    . TONSILLECTOMY  1952  . TONSILLECTOMY      MEDICATIONS: . carvedilol (COREG) 6.25 MG tablet  . diazepam (VALIUM) 2 MG tablet  . furosemide (LASIX) 20 MG tablet  . hydrALAZINE (APRESOLINE) 10 MG tablet  . HYDROcodone-acetaminophen (NORCO/VICODIN) 5-325 MG tablet  . isosorbide mononitrate (IMDUR) 30 MG 24 hr tablet  . levothyroxine (SYNTHROID, LEVOTHROID) 25 MCG tablet  . Multiple Vitamins-Minerals (CENTRUM PO)  . Omega-3 Fatty Acids (FISH OIL) 1000 MG CAPS  . Probiotic Product (PROBIOTIC DAILY PO)  . raNITIdine HCl (ACID REDUCER PO)   . 0.9 %  sodium chloride infusion    [facility] Stay [facility]/Anesthesiology Phone [phone] 06/27/2018 2:26 PM

## 2018-06-29 ENCOUNTER — Ambulatory Visit (HOSPITAL_COMMUNITY): Payer: Medicare Other | Admitting: Vascular Surgery

## 2018-06-29 ENCOUNTER — Encounter (HOSPITAL_COMMUNITY)
Admission: RE | Disposition: A | Discharge status: Home / Self Care | Payer: Self-pay | Source: Ambulatory Visit | Attending: Emergency Medicine

## 2018-06-29 ENCOUNTER — Ambulatory Visit (HOSPITAL_COMMUNITY)
Admission: RE | Admit: 2018-06-29 | Discharge: 2018-06-29 | Disposition: A | Discharge status: Home / Self Care | Payer: Medicare Other | Source: Ambulatory Visit | Attending: Emergency Medicine | Admitting: Emergency Medicine

## 2018-06-29 ENCOUNTER — Encounter (HOSPITAL_COMMUNITY): Payer: Self-pay | Admitting: Urology

## 2018-06-29 ENCOUNTER — Ambulatory Visit (HOSPITAL_COMMUNITY): Payer: Medicare Other | Admitting: Certified Registered Nurse Anesthetist

## 2018-06-29 ENCOUNTER — Ambulatory Visit (HOSPITAL_COMMUNITY): Payer: Medicare Other

## 2018-06-29 ENCOUNTER — Other Ambulatory Visit: Payer: Medicare Other

## 2018-06-29 DIAGNOSIS — R918 Other nonspecific abnormal finding of lung field: Secondary | ICD-10-CM | POA: Diagnosis not present

## 2018-06-29 DIAGNOSIS — Z79890 Hormone replacement therapy (postmenopausal): Secondary | ICD-10-CM | POA: Diagnosis not present

## 2018-06-29 DIAGNOSIS — I428 Other cardiomyopathies: Secondary | ICD-10-CM | POA: Insufficient documentation

## 2018-06-29 DIAGNOSIS — J984 Other disorders of lung: Secondary | ICD-10-CM | POA: Diagnosis not present

## 2018-06-29 DIAGNOSIS — I110 Hypertensive heart disease with heart failure: Secondary | ICD-10-CM | POA: Diagnosis not present

## 2018-06-29 DIAGNOSIS — Z419 Encounter for procedure for purposes other than remedying health state, unspecified: Secondary | ICD-10-CM

## 2018-06-29 DIAGNOSIS — Z79899 Other long term (current) drug therapy: Secondary | ICD-10-CM | POA: Diagnosis not present

## 2018-06-29 DIAGNOSIS — K219 Gastro-esophageal reflux disease without esophagitis: Secondary | ICD-10-CM | POA: Diagnosis not present

## 2018-06-29 DIAGNOSIS — R846 Abnormal cytological findings in specimens from respiratory organs and thorax: Secondary | ICD-10-CM | POA: Diagnosis not present

## 2018-06-29 DIAGNOSIS — E039 Hypothyroidism, unspecified: Secondary | ICD-10-CM | POA: Diagnosis not present

## 2018-06-29 DIAGNOSIS — R590 Localized enlarged lymph nodes: Secondary | ICD-10-CM | POA: Diagnosis not present

## 2018-06-29 DIAGNOSIS — I5022 Chronic systolic (congestive) heart failure: Secondary | ICD-10-CM | POA: Diagnosis not present

## 2018-06-29 DIAGNOSIS — Z79891 Long term (current) use of opiate analgesic: Secondary | ICD-10-CM | POA: Insufficient documentation

## 2018-06-29 DIAGNOSIS — R911 Solitary pulmonary nodule: Secondary | ICD-10-CM | POA: Diagnosis not present

## 2018-06-29 DIAGNOSIS — I739 Peripheral vascular disease, unspecified: Secondary | ICD-10-CM | POA: Diagnosis not present

## 2018-06-29 DIAGNOSIS — C8590 Non-Hodgkin lymphoma, unspecified, unspecified site: Secondary | ICD-10-CM | POA: Diagnosis not present

## 2018-06-29 DIAGNOSIS — Z9889 Other specified postprocedural states: Secondary | ICD-10-CM

## 2018-06-29 DIAGNOSIS — R848 Other abnormal findings in specimens from respiratory organs and thorax: Secondary | ICD-10-CM | POA: Diagnosis not present

## 2018-06-29 DIAGNOSIS — I509 Heart failure, unspecified: Secondary | ICD-10-CM | POA: Diagnosis not present

## 2018-06-29 HISTORY — PX: VIDEO BRONCHOSCOPY WITH ENDOBRONCHIAL NAVIGATION: SHX6175

## 2018-06-29 HISTORY — PX: VIDEO BRONCHOSCOPY WITH ENDOBRONCHIAL ULTRASOUND: SHX6177

## 2018-06-29 SURGERY — BRONCHOSCOPY, WITH EBUS
Anesthesia: General | Laterality: Right

## 2018-06-29 MED ORDER — LACTATED RINGERS IV SOLN
INTRAVENOUS | Status: DC | PRN
  Administered 2018-06-29: 08:00:00 via INTRAVENOUS

## 2018-06-29 MED ORDER — PHENYLEPHRINE HCL 10 MG/ML IJ SOLN
INTRAMUSCULAR | Status: DC | PRN
  Administered 2018-06-29: 80 ug via INTRAVENOUS
  Administered 2018-06-29: 40 ug via INTRAVENOUS
  Administered 2018-06-29: 80 ug via INTRAVENOUS

## 2018-06-29 MED ORDER — ROCURONIUM BROMIDE 100 MG/10ML IV SOLN
INTRAVENOUS | Status: DC | PRN
  Administered 2018-06-29 (×2): 5 mg via INTRAVENOUS
  Administered 2018-06-29: 40 mg via INTRAVENOUS

## 2018-06-29 MED ORDER — LIDOCAINE HCL (CARDIAC) PF 100 MG/5ML IV SOSY
PREFILLED_SYRINGE | INTRAVENOUS | Status: DC | PRN
  Administered 2018-06-29: 50 mg via INTRAVENOUS

## 2018-06-29 MED ORDER — LACTATED RINGERS IV SOLN: INTRAVENOUS | Status: DC

## 2018-06-29 MED ORDER — 0.9 % SODIUM CHLORIDE (POUR BTL) OPTIME
TOPICAL | Status: DC | PRN
  Administered 2018-06-29: 1000 mL

## 2018-06-29 MED ORDER — PROPOFOL 10 MG/ML IV BOLUS
INTRAVENOUS | Status: DC | PRN
  Administered 2018-06-29: 120 mg via INTRAVENOUS

## 2018-06-29 MED ORDER — SUGAMMADEX SODIUM 200 MG/2ML IV SOLN
INTRAVENOUS | Status: DC | PRN
  Administered 2018-06-29: 120.6 mg via INTRAVENOUS

## 2018-06-29 MED ORDER — SODIUM CHLORIDE 0.9 % IV SOLN
INTRAVENOUS | Status: DC | PRN
  Administered 2018-06-29: 25 ug/min via INTRAVENOUS

## 2018-06-29 MED ORDER — FENTANYL CITRATE (PF) 100 MCG/2ML IJ SOLN: 25.0000 ug | INTRAMUSCULAR | Status: DC | PRN

## 2018-06-29 MED ORDER — FENTANYL CITRATE (PF) 250 MCG/5ML IJ SOLN
INTRAMUSCULAR | Status: DC | PRN
  Administered 2018-06-29: 75 ug via INTRAVENOUS
  Administered 2018-06-29 (×2): 25 ug via INTRAVENOUS

## 2018-06-29 MED ORDER — FENTANYL CITRATE (PF) 250 MCG/5ML IJ SOLN
INTRAMUSCULAR | Status: AC
  Filled 2018-06-29: qty 5

## 2018-06-29 MED ORDER — ONDANSETRON HCL 4 MG/2ML IJ SOLN
INTRAMUSCULAR | Status: DC | PRN
  Administered 2018-06-29: 4 mg via INTRAVENOUS

## 2018-06-29 MED ORDER — PROPOFOL 10 MG/ML IV BOLUS
INTRAVENOUS | Status: AC
  Filled 2018-06-29: qty 40

## 2018-06-29 SURGICAL SUPPLY — 49 items
ADAPTER BRONCH F/PENTAX (ADAPTER) ×3 IMPLANT
ADPR BSCP EDG PNTX (ADAPTER) ×1
BRUSH CYTOL CELLEBRITY 1.5X140 (MISCELLANEOUS) ×3 IMPLANT
BRUSH SUPERTRAX BIOPSY (INSTRUMENTS) IMPLANT
BRUSH SUPERTRAX NDL-TIP CYTO (INSTRUMENTS) ×7 IMPLANT
CANISTER SUCT 3000ML PPV (MISCELLANEOUS) ×3 IMPLANT
CHANNEL WORK EXTEND EDGE 180 (KITS) IMPLANT
CHANNEL WORK EXTEND EDGE 45 (KITS) IMPLANT
CHANNEL WORK EXTEND EDGE 90 (KITS) IMPLANT
CONT SPEC 4OZ CLIKSEAL STRL BL (MISCELLANEOUS) ×3 IMPLANT
COVER BACK TABLE 60X90IN (DRAPES) ×3 IMPLANT
COVER DOME SNAP 22 D (MISCELLANEOUS) ×3 IMPLANT
FILTER STRAW FLUID ASPIR (MISCELLANEOUS) IMPLANT
FORCEPS BIOP RJ4 1.8 (CUTTING FORCEPS) IMPLANT
FORCEPS BIOP SUPERTRX PREMAR (INSTRUMENTS) ×5 IMPLANT
GAUZE SPONGE 4X4 12PLY STRL (GAUZE/BANDAGES/DRESSINGS) ×3 IMPLANT
GLOVE BIO SURGEON STRL SZ7.5 (GLOVE) ×6 IMPLANT
GOWN STRL REUS W/ TWL LRG LVL3 (GOWN DISPOSABLE) ×2 IMPLANT
GOWN STRL REUS W/TWL LRG LVL3 (GOWN DISPOSABLE) ×6
KIT CLEAN ENDO COMPLIANCE (KITS) ×6 IMPLANT
KIT LOCATABLE GUIDE (CANNULA) IMPLANT
KIT MARKER FIDUCIAL DELIVERY (KITS) IMPLANT
KIT PROCEDURE EDGE 180 (KITS) ×2 IMPLANT
KIT PROCEDURE EDGE 45 (KITS) IMPLANT
KIT PROCEDURE EDGE 90 (KITS) IMPLANT
KIT TURNOVER KIT B (KITS) ×3 IMPLANT
MARKER SKIN DUAL TIP RULER LAB (MISCELLANEOUS) ×3 IMPLANT
NDL EBUS SONO TIP PENTAX (NEEDLE) ×1 IMPLANT
NDL SUPERTRX PREMARK BIOPSY (NEEDLE) ×1 IMPLANT
NEEDLE EBUS SONO TIP PENTAX (NEEDLE) ×3 IMPLANT
NEEDLE SUPERTRX PREMARK BIOPSY (NEEDLE) ×9 IMPLANT
NS IRRIG 1000ML POUR BTL (IV SOLUTION) ×3 IMPLANT
OIL SILICONE PENTAX (PARTS (SERVICE/REPAIRS)) ×3 IMPLANT
PAD ARMBOARD 7.5X6 YLW CONV (MISCELLANEOUS) ×6 IMPLANT
PATCHES PATIENT (LABEL) ×9 IMPLANT
STOPCOCK 4 WAY LG BORE MALE ST (IV SETS) ×2 IMPLANT
SYR 20CC LL (SYRINGE) ×6 IMPLANT
SYR 20ML ECCENTRIC (SYRINGE) ×6 IMPLANT
SYR 50ML SLIP (SYRINGE) ×3 IMPLANT
SYR 5ML LUER SLIP (SYRINGE) ×3 IMPLANT
TOWEL OR 17X24 6PK STRL BLUE (TOWEL DISPOSABLE) ×3 IMPLANT
TRAP SPECIMEN MUCOUS 40CC (MISCELLANEOUS) ×2 IMPLANT
TUBE CONNECTING 20'X1/4 (TUBING) ×2
TUBE CONNECTING 20X1/4 (TUBING) ×4 IMPLANT
UNDERPAD 30X30 (UNDERPADS AND DIAPERS) ×3 IMPLANT
VALVE BIOPSY  SINGLE USE (MISCELLANEOUS) ×8
VALVE BIOPSY SINGLE USE (MISCELLANEOUS) IMPLANT
VALVE DISPOSABLE (MISCELLANEOUS) ×3 IMPLANT
WATER STERILE IRR 1000ML POUR (IV SOLUTION) ×3 IMPLANT

## 2018-06-29 NOTE — Anesthesia Procedure Notes (Signed)
Procedure Name: Intubation Date/Time: 06/29/2018 8:39 AM Performed by: Glynda Jaeger, CRNA Pre-anesthesia Checklist: Patient identified, Patient being monitored, Timeout performed, Emergency Drugs available and Suction available Patient Re-evaluated:Patient Re-evaluated prior to induction Oxygen Delivery Method: Circle System Utilized Preoxygenation: Pre-oxygenation with 100% oxygen Induction Type: IV induction Ventilation: Mask ventilation without difficulty Laryngoscope Size: Mac and 3 Grade View: Grade I Tube type: Oral Tube size: 8.5 mm Number of attempts: 1 Airway Equipment and Method: Stylet Placement Confirmation: ETT inserted through vocal cords under direct vision,  positive ETCO2 and breath sounds checked- equal and bilateral Secured at: 22 cm Tube secured with: Tape Dental Injury: Teeth and Oropharynx as per pre-operative assessment

## 2018-06-29 NOTE — Discharge Instructions (Signed)
Flexible Bronchoscopy, Care After These instructions give you information on caring for yourself after your procedure. Your doctor may also give you more specific instructions. Call your doctor if you have any problems or questions after your procedure. Follow these instructions at home:  Do not eat or drink anything for 2 hours after your procedure. If you try to eat or drink before the medicine wears off, food or drink could go into your lungs. You could also burn yourself.  After 2 hours have passed and when you can cough and gag normally, you may eat soft food and drink liquids slowly.  The day after the test, you may eat your normal diet.  You may do your normal activities.  Keep all doctor visits. Get help right away if:  You get more and more short of breath.  You get light-headed.  You feel like you are going to pass out (faint).  You have chest pain.  You have new problems that worry you.  You cough up more than a little blood.  You cough up more blood than before.  Call our office for any problems or concerns [phone]   This information is not intended to replace advice given to you by your health care provider. Make sure you discuss any questions you have with your health care provider. Document Released: 08/30/2009 Document Revised: 04/09/2016 Document Reviewed: 07/07/2013 Elsevier Interactive Patient Education  2017 [employer].

## 2018-06-29 NOTE — Interval H&P Note (Signed)
PCCM Interval Note  Patient presents today for further evaluation of her bilateral hilar and mediastinal lymphadenopathy, cavitary left upper lobe mass, bilateral pulmonary nodules.  She is well, has no new complaints.  She denies any dyspnea, cough, wheezing, hemoptysis.  She understands the procedure, its rationale, risks, benefits.  All questions answered.  Vitals:   06/29/18 0635  BP: (!) 153/71  Pulse: 72  Resp: 18  Temp: 97.7 F (36.5 C)  TempSrc: Oral  SpO2: 100%  Weight: 60.3 kg   CBC Latest Ref Rng & Units 06/24/2018 06/17/2018 05/30/2018  WBC 4.0 - 10.5 K/uL 3.3(L) 2.8(L) 3.2(L)  Hemoglobin 12.0 - 15.0 g/dL 11.2(L) 11.6 11.6  Hematocrit 36.0 - 46.0 % 36.0 36.7 36.4  Platelets 150 - 400 K/uL 134(L) 150 167   BMP Latest Ref Rng & Units 06/24/2018 06/17/2018 05/30/2018  Glucose 70 - 99 mg/dL 108(H) 109(H) 105  BUN 8 - 23 mg/dL 13 14 15   Creatinine 0.44 - 1.00 mg/dL 1.28(H) 1.49(H) 1.50(H)  Sodium 135 - 145 mmol/L 140 142 141  Potassium 3.5 - 5.1 mmol/L 4.1 5.4(H) 4.9(H)  Chloride 98 - 111 mmol/L 104 106 102  CO2 22 - 32 mmol/L 30 27 31   Calcium 8.9 - 10.3 mg/dL 9.5 10.2 9.8   She is n.p.o., to her medications appropriately.  No barriers identified besides some mild thrombocytopenia which should not preclude biopsies.  We will plan to proceed with navigational bronchoscopy, endobronchial ultrasound number nodal and lung biopsies.  Her husband is present.  Baltazar Apo, MD, PhD 06/29/2018, 8:21 AM [facility] [phone] or if no answer [phone]

## 2018-06-29 NOTE — Transfer of Care (Signed)
Immediate Anesthesia Transfer of Care Note  Patient: Crystal [name]  Procedure(s) Performed: VIDEO BRONCHOSCOPY WITH ENDOBRONCHIAL ULTRASOUND (Right ) VIDEO BRONCHOSCOPY WITH ENDOBRONCHIAL NAVIGATION (Right )  Patient Location: PACU  Anesthesia Type:General  Level of Consciousness: awake, alert , patient cooperative and responds to stimulation  Airway & Oxygen Therapy: Patient Spontanous Breathing and Patient connected to face mask oxygen  Post-op Assessment: Report given to RN, Post -op Vital signs reviewed and stable and Patient moving all extremities X 4  Post vital signs: Reviewed and stable  Last Vitals:  Vitals Value Taken Time  BP 122/72 06/29/2018 11:21 AM  Temp 36.2 C 06/29/2018 11:21 AM  Pulse 68 06/29/2018 11:22 AM  Resp 17 06/29/2018 11:22 AM  SpO2 98 % 06/29/2018 11:22 AM  Vitals shown include unvalidated device data.  Last Pain:  Vitals:   06/29/18 0639  TempSrc:   PainSc: 0-No pain      Patients Stated Pain Goal: 0 (79/72/82 0601)  Complications: No apparent anesthesia complications

## 2018-06-29 NOTE — Op Note (Signed)
Video Bronchoscopy with Endobronchial Ultrasound and  Electromagnetic Navigation Procedure Note  Date of Operation: 06/29/2018  Pre-op Diagnosis: Hilar adenopathy, left upper lobe mass, pulmonary nodules  Post-op Diagnosis: Same  Surgeon: Baltazar Apo  Assistants: Dr. Gilberto Better  Anesthesia: General endotracheal anesthesia  Operation: Flexible video fiberoptic bronchoscopy with endobronchial ultrasound and biopsies.  Estimated Blood Loss: 30 cc  Complications: None apparent  Indications and History: Crystal [name] is a 84 y.o. female with remote history of B-cell lymphoma, found to have a cavitary left upper lobe mass and scattered pulmonary nodules on CT scan of the chest.  She also has mediastinal and hilar lymphadenopathy, some of which contains calcifications.  Recommendation was made to achieve a tissue diagnosis via bronchoscopy with interbronchial ultrasound and transbronchial biopsies.  The risks, benefits, complications, treatment options and expected outcomes were discussed with the patient.  The possibilities of pneumothorax, pneumonia, reaction to medication, pulmonary aspiration, perforation of a viscus, bleeding, failure to diagnose a condition and creating a complication requiring transfusion or operation were discussed with the patient who freely signed the consent.    Description of Procedure: The patient was examined in the preoperative area and history and data from the preprocedure consultation were reviewed. It was deemed appropriate to proceed.  The patient was taken to OR 10, identified as [place] and the procedure verified as Flexible Video Fiberoptic Bronchoscopy.  A Time Out was held and the above information confirmed. After being taken to the operating room general anesthesia was initiated and the patient  was orally intubated. The video fiberoptic bronchoscope was introduced via the endotracheal tube and a general inspection was performed which showed an  accessory right upper lobe apical segment airway, no significant secretions.  There were areas of friable mucosa with some bleeding when the scope touched the airway. The standard scope was then withdrawn and the endobronchial ultrasound was used to identify and characterize the peritracheal, hilar and bronchial lymph nodes. Inspection showed enlarged nodes in all regions bilaterally. Using real-time ultrasound guidance Wang needle biopsies were take from Station 4R, 7, 4L nodes and were sent for cytology.   Attention was then turned to the patient's parenchymal disease.  Prior to the date of the procedure a high-resolution CT scan of the chest was performed. Utilizing [place] a virtual tracheobronchial tree was generated to allow the creation of distinct navigation pathways to the patient's parenchymal abnormalities. The extendable working channel and locator guide were introduced into the standard bronchoscope. The distinct navigation pathways prepared prior to this procedure were then utilized to navigate to within 0.8 to 1.2 cm of patient's cavitary left upper lobe mass identified on CT scan. The extendable working channel was secured into place and the locator guide was withdrawn. Under fluoroscopic guidance transbronchial needle brushings, transbronchial Wang needle biopsies, and transbronchial forceps biopsies were performed to be sent for cytology and pathology. A bronchioalveolar lavage was performed in the left upper lobe and sent for cytology and microbiology (bacterial, fungal, AFB smears and cultures). At the end of the procedure a general airway inspection was performed and there was no evidence of active bleeding. The bronchoscope was removed.  The patient tolerated the procedure well. There was no significant blood loss and there were no obvious complications. A post-procedural chest x-ray showed no evidence of pneumothorax.   Samples: 1. Wang needle biopsies from 4R node 2.  Wang needle biopsies from 7 node 3. Wang needle biopsies from 4L node 4. Transbronchial needle brushings from  LUL mass 5. Transbronchial Wang needle biopsies from left upper lobe mass 6. Transbronchial forceps biopsies from left upper lobe mass 7. Bronchoalveolar lavage from upper lobe  Plans:  The patient will be discharged from the PACU to home when recovered from anesthesia and after chest x-ray is reviewed. We will review the cytology, pathology and microbiology results with the patient when they become available. Outpatient followup will be with Dr Lamonte Sakai.    Baltazar Apo, MD, PhD 06/29/2018, 12:33 PM [facility] [phone] or if no answer [phone]

## 2018-06-29 NOTE — Anesthesia Postprocedure Evaluation (Signed)
Anesthesia Post Note  Patient: Crystal [name]  Procedure(s) Performed: VIDEO BRONCHOSCOPY WITH ENDOBRONCHIAL ULTRASOUND (Right ) VIDEO BRONCHOSCOPY WITH ENDOBRONCHIAL NAVIGATION (Right )     Patient location during evaluation: PACU Anesthesia Type: General Level of consciousness: awake and alert Pain management: pain level controlled Vital Signs Assessment: post-procedure vital signs reviewed and stable Respiratory status: spontaneous breathing, nonlabored ventilation, respiratory function stable and patient connected to nasal cannula oxygen Cardiovascular status: blood pressure returned to baseline and stable Postop Assessment: no apparent nausea or vomiting Anesthetic complications: no    Last Vitals:  Vitals:   06/29/18 1215 06/29/18 1220  BP: (!) 105/56 (!) 105/56  Pulse: 60 (!) 59  Resp: 16 14  Temp:    SpO2: 100% 100%    Last Pain:  Vitals:   06/29/18 1121  TempSrc:   PainSc: 0-No pain                 Effie Berkshire

## 2018-06-29 NOTE — Anesthesia Preprocedure Evaluation (Addendum)
Anesthesia Evaluation  Patient identified by MRN, date of birth, ID band Patient awake    Reviewed: Allergy & Precautions, NPO status , Patient's Chart, lab work & pertinent test results  Airway Mallampati: I  TM Distance: >3 FB Neck ROM: Full    Dental  (+) Upper Dentures, Lower Dentures, Dental Advisory Given   Pulmonary neg pulmonary ROS,    breath sounds clear to auscultation       Cardiovascular hypertension, Pt. on medications and Pt. on home beta blockers + Peripheral Vascular Disease and +CHF   Rhythm:Regular Rate:Normal     Neuro/Psych Anxiety negative neurological ROS     GI/Hepatic Neg liver ROS, GERD  Medicated,  Endo/Other  Hypothyroidism   Renal/GU negative Renal ROS     Musculoskeletal negative musculoskeletal ROS (+)   Abdominal Normal abdominal exam  (+)   Peds  Hematology   Anesthesia Other Findings   Reproductive/Obstetrics                            Lab Results  Component Value Date   WBC 3.3 (L) 06/24/2018   HGB 11.2 (L) 06/24/2018   HCT 36.0 06/24/2018   MCV 93.8 06/24/2018   PLT 134 (L) 06/24/2018   Lab Results  Component Value Date   CREATININE 1.28 (H) 06/24/2018   BUN 13 06/24/2018   NA 140 06/24/2018   K 4.1 06/24/2018   CL 104 06/24/2018   CO2 30 06/24/2018   Lab Results  Component Value Date   INR 1.08 09/05/2009   EKG: normal sinus rhythm.  Anesthesia Physical Anesthesia Plan  ASA: II  Anesthesia Plan: General   Post-op Pain Management:    Induction: Intravenous  PONV Risk Score and Plan: 4 or greater and Ondansetron and Treatment may vary due to age or medical condition  Airway Management Planned: Oral ETT  Additional Equipment: None  Intra-op Plan:   Post-operative Plan: Extubation in OR  Informed Consent: I have reviewed the patients History and Physical, chart, labs and discussed the procedure including the risks, benefits  and alternatives for the proposed anesthesia with the patient or authorized representative who has indicated his/her understanding and acceptance.   Dental advisory given  Plan Discussed with: CRNA  Anesthesia Plan Comments:        Anesthesia Quick Evaluation

## 2018-06-30 ENCOUNTER — Encounter (HOSPITAL_COMMUNITY): Payer: Self-pay | Admitting: Emergency Medicine

## 2018-06-30 LAB — CYTOLOGY - PAP

## 2018-07-01 ENCOUNTER — Telehealth: Payer: Self-pay | Admitting: Emergency Medicine

## 2018-07-01 LAB — CULTURE, RESPIRATORY

## 2018-07-01 LAB — CULTURE, RESPIRATORY W GRAM STAIN: Culture: NO GROWTH

## 2018-07-01 NOTE — Telephone Encounter (Signed)
Please call the patient to let her know that the results from her bronchoscopy done on Wednesday 8/14 are all negative for any evidence of cancer cells.  We will need to continue to follow with Dr. Marin Olp to decide how to follow her abnormality, whether she needs any other kind of biopsy going forward.

## 2018-07-01 NOTE — Telephone Encounter (Signed)
Spoke with pt. She is aware of results. Nothing further was needed.  

## 2018-07-01 NOTE — Telephone Encounter (Signed)
Attempted to call pt. I did not receive an answer. I have left a message for pt to return our call.  

## 2018-07-04 MED FILL — ISOSORBIDE MN ER 30 MG TAB: 30 | 90 days supply | Qty: 45 | Fill #1

## 2018-07-06 ENCOUNTER — Telehealth: Payer: Self-pay | Admitting: Hematology & Oncology

## 2018-07-06 NOTE — Telephone Encounter (Signed)
I left a message on Crystal [name]'s answering machine at home.  I told her that the results of the biopsies so far been negative.  However, I still think that there is a problem with cancer.  As such, we have to do something more invasive.  I am going to speak with 1 of the thoracic surgeons.  I told her that I was speak with Dr. Roxan Hockey and that he hopefully will call her to get her in.  I just feel bad that we have not been able to figure out what is going on yet.  Of course, we know that God can take care of this and he will take care of this and maybe we will not find cancer so she understands this and her faith is very strong.  Her graph I told her to call back if there are any problems.  Lattie Haw, MD

## 2018-07-11 ENCOUNTER — Ambulatory Visit: Payer: Medicare Other | Admitting: Emergency Medicine

## 2018-07-21 ENCOUNTER — Encounter: Payer: Medicare Other | Admitting: Thoracic Surgery (Cardiothoracic Vascular Surgery)

## 2018-07-22 ENCOUNTER — Encounter: Payer: Medicare Other | Admitting: Thoracic Surgery (Cardiothoracic Vascular Surgery)

## 2018-07-26 ENCOUNTER — Other Ambulatory Visit: Payer: Self-pay | Admitting: Family Medicine

## 2018-08-15 ENCOUNTER — Ambulatory Visit: Payer: Medicare Other | Admitting: Hematology & Oncology

## 2018-08-15 ENCOUNTER — Other Ambulatory Visit: Payer: Medicare Other

## 2018-08-23 MED FILL — CARVEDILOL 6.25 MG TABLET: 6.25 | 90 days supply | Qty: 180 | Fill #1

## 2018-08-23 MED FILL — hydrALAZINE HCL 10 MG TABS: 10 | 90 days supply | Qty: 270 | Fill #1

## 2018-08-29 ENCOUNTER — Ambulatory Visit (INDEPENDENT_AMBULATORY_CARE_PROVIDER_SITE_OTHER): Payer: Medicare Other | Admitting: Family Medicine

## 2018-08-29 ENCOUNTER — Encounter: Payer: Self-pay | Admitting: Family Medicine

## 2018-08-29 VITALS — BP 120/70 | HR 65 | Temp 97.8°F | Resp 18 | Wt 134.4 lb

## 2018-08-29 DIAGNOSIS — R740 Nonspecific elevation of levels of transaminase and lactic acid dehydrogenase [LDH]: Secondary | ICD-10-CM | POA: Diagnosis not present

## 2018-08-29 DIAGNOSIS — E559 Vitamin D deficiency, unspecified: Secondary | ICD-10-CM | POA: Diagnosis not present

## 2018-08-29 DIAGNOSIS — E039 Hypothyroidism, unspecified: Secondary | ICD-10-CM | POA: Diagnosis not present

## 2018-08-29 DIAGNOSIS — R7402 Elevation of levels of lactic acid dehydrogenase (LDH): Secondary | ICD-10-CM

## 2018-08-29 DIAGNOSIS — E782 Mixed hyperlipidemia: Secondary | ICD-10-CM | POA: Diagnosis not present

## 2018-08-29 DIAGNOSIS — I428 Other cardiomyopathies: Secondary | ICD-10-CM | POA: Diagnosis not present

## 2018-08-29 DIAGNOSIS — K589 Irritable bowel syndrome without diarrhea: Secondary | ICD-10-CM | POA: Diagnosis not present

## 2018-08-29 DIAGNOSIS — I10 Essential (primary) hypertension: Secondary | ICD-10-CM | POA: Diagnosis not present

## 2018-08-29 DIAGNOSIS — R739 Hyperglycemia, unspecified: Secondary | ICD-10-CM

## 2018-08-29 LAB — COMPREHENSIVE METABOLIC PANEL
ALT: 34 U/L (ref 0–35)
AST: 24 U/L (ref 0–37)
Albumin: 3.7 g/dL (ref 3.5–5.2)
Alkaline Phosphatase: 119 U/L — ABNORMAL HIGH (ref 39–117)
BILIRUBIN TOTAL: 0.3 mg/dL (ref 0.2–1.2)
BUN: 17 mg/dL (ref 6–23)
CHLORIDE: 103 meq/L (ref 96–112)
CO2: 35 meq/L — AB (ref 19–32)
Calcium: 9.8 mg/dL (ref 8.4–10.5)
Creatinine, Ser: 1.25 mg/dL — ABNORMAL HIGH (ref 0.40–1.20)
GFR: 52.68 mL/min — AB (ref >60.00)
GLUCOSE: 116 mg/dL — AB (ref 70–99)
Potassium: 4.1 mEq/L (ref 3.5–5.1)
Sodium: 138 mEq/L (ref 135–145)
Total Protein: 8.2 g/dL (ref 6.0–8.3)

## 2018-08-29 LAB — CBC WITH DIFFERENTIAL/PLATELET
BASOS ABS: 0 10*3/uL (ref 0.0–0.1)
Basophils Relative: 0.5 % (ref 0.0–3.0)
EOS ABS: 0 10*3/uL (ref 0.0–0.7)
Eosinophils Relative: 1.3 % (ref 0.0–5.0)
HCT: 36.3 % (ref 36.0–46.0)
Hemoglobin: 11.8 g/dL — ABNORMAL LOW (ref 12.0–15.0)
LYMPHS ABS: 1.1 10*3/uL (ref 0.7–4.0)
Lymphocytes Relative: 39.2 % (ref 12.0–46.0)
MCHC: 32.6 g/dL (ref 30.0–36.0)
MCV: 89.7 fl (ref 78.0–100.0)
MONOS PCT: 12 % (ref 3.0–12.0)
Monocytes Absolute: 0.3 10*3/uL (ref 0.1–1.0)
NEUTROS ABS: 1.4 10*3/uL (ref 1.4–7.7)
NEUTROS PCT: 47 % (ref 43.0–77.0)
PLATELETS: 146 10*3/uL — AB (ref 150.0–400.0)
RBC: 4.05 Mil/uL (ref 3.87–5.11)
RDW: 13.7 % (ref 11.5–15.5)
WBC: 2.9 10*3/uL — ABNORMAL LOW (ref 4.0–10.5)

## 2018-08-29 LAB — LIPID PANEL
CHOL/HDL RATIO: 3
Cholesterol: 172 mg/dL (ref 0–200)
HDL: 55.9 mg/dL (ref >39.00)
LDL CALC: 97 mg/dL (ref 0–99)
NONHDL: 116.5
Triglycerides: 98 mg/dL (ref 0.0–149.0)
VLDL: 19.6 mg/dL (ref 0.0–40.0)

## 2018-08-29 LAB — HEMOGLOBIN A1C: HEMOGLOBIN A1C: 5.5 % (ref 4.6–6.5)

## 2018-08-29 LAB — LACTATE DEHYDROGENASE: LDH: 176 U/L (ref 120–250)

## 2018-08-29 LAB — SEDIMENTATION RATE: Sed Rate: 22 mm/hr (ref 0–30)

## 2018-08-29 LAB — TSH: TSH: 2.56 u[IU]/mL (ref 0.35–4.50)

## 2018-08-29 NOTE — Patient Instructions (Addendum)
Encouraged increased hydration and fiber in diet. Daily probiotics. If bowels not moving can use MOM 2 tbls po in 4 oz of warm prune juice by mouth every 2-3 days. If no results then repeat in 4 hours with  Dulcolax suppository pr, may repeat again in 4 more hours as needed. Seek care if symptoms worsen. Consider daily Miralax and/or Dulcolax if symptoms persist.   Hypertension Hypertension, commonly called high blood pressure, is when the force of blood pumping through the arteries is too strong. The arteries are the blood vessels that carry blood from the heart throughout the body. Hypertension forces the heart to work harder to pump blood and may cause arteries to become narrow or stiff. Having untreated or uncontrolled hypertension can cause heart attacks, strokes, kidney disease, and other problems. A blood pressure reading consists of a higher number over a lower number. Ideally, your blood pressure should be below 120/80. The first ("top") number is called the systolic pressure. It is a measure of the pressure in your arteries as your heart beats. The second ("bottom") number is called the diastolic pressure. It is a measure of the pressure in your arteries as the heart relaxes. What are the causes? The cause of this condition is not known. What increases the risk? Some risk factors for high blood pressure are under your control. Others are not. Factors you can change  Smoking.  Having type 2 diabetes mellitus, high cholesterol, or both.  Not getting enough exercise or physical activity.  Being overweight.  Having too much fat, sugar, calories, or salt (sodium) in your diet.  Drinking too much alcohol. Factors that are difficult or impossible to change  Having chronic kidney disease.  Having a family history of high blood pressure.  Age. Risk increases with age.  Race. You may be at higher risk if you are African-American.  Gender. Men are at higher risk than women before age  40. After age 76, women are at higher risk than men.  Having obstructive sleep apnea.  Stress. What are the signs or symptoms? Extremely high blood pressure (hypertensive crisis) may cause:  Headache.  Anxiety.  Shortness of breath.  Nosebleed.  Nausea and vomiting.  Severe chest pain.  Jerky movements you cannot control (seizures).  How is this diagnosed? This condition is diagnosed by measuring your blood pressure while you are seated, with your arm resting on a surface. The cuff of the blood pressure monitor will be placed directly against the skin of your upper arm at the level of your heart. It should be measured at least twice using the same arm. Certain conditions can cause a difference in blood pressure between your right and left arms. Certain factors can cause blood pressure readings to be lower or higher than normal (elevated) for a short period of time:  When your blood pressure is higher when you are in a health care provider's office than when you are at home, this is called white coat hypertension. Most people with this condition do not need medicines.  When your blood pressure is higher at home than when you are in a health care provider's office, this is called masked hypertension. Most people with this condition may need medicines to control blood pressure.  If you have a high blood pressure reading during one visit or you have normal blood pressure with other risk factors:  You may be asked to return on a different day to have your blood pressure checked again.  You may  be asked to monitor your blood pressure at home for 1 week or longer.  If you are diagnosed with hypertension, you may have other blood or imaging tests to help your health care provider understand your overall risk for other conditions. How is this treated? This condition is treated by making healthy lifestyle changes, such as eating healthy foods, exercising more, and reducing your alcohol  intake. Your health care provider may prescribe medicine if lifestyle changes are not enough to get your blood pressure under control, and if:  Your systolic blood pressure is above 130.  Your diastolic blood pressure is above 80.  Your personal target blood pressure may vary depending on your medical conditions, your age, and other factors. Follow these instructions at home: Eating and drinking  Eat a diet that is high in fiber and potassium, and low in sodium, added sugar, and fat. An example eating plan is called the DASH (Dietary Approaches to Stop Hypertension) diet. To eat this way: ? Eat plenty of fresh fruits and vegetables. Try to fill half of your plate at each meal with fruits and vegetables. ? Eat whole grains, such as whole wheat pasta, brown rice, or whole grain bread. Fill about one quarter of your plate with whole grains. ? Eat or drink low-fat dairy products, such as skim milk or low-fat yogurt. ? Avoid fatty cuts of meat, processed or cured meats, and poultry with skin. Fill about one quarter of your plate with lean proteins, such as fish, chicken without skin, beans, eggs, and tofu. ? Avoid premade and processed foods. These tend to be higher in sodium, added sugar, and fat.  Reduce your daily sodium intake. Most people with hypertension should eat less than 1,500 mg of sodium a day.  Limit alcohol intake to no more than 1 drink a day for nonpregnant women and 2 drinks a day for men. One drink equals 12 oz of beer, 5 oz of wine, or 1 oz of hard liquor. Lifestyle  Work with your health care provider to maintain a healthy body weight or to lose weight. Ask what an ideal weight is for you.  Get at least 30 minutes of exercise that causes your heart to beat faster (aerobic exercise) most days of the week. Activities may include walking, swimming, or biking.  Include exercise to strengthen your muscles (resistance exercise), such as pilates or lifting weights, as part of your  weekly exercise routine. Try to do these types of exercises for 30 minutes at least 3 days a week.  Do not use any products that contain nicotine or tobacco, such as cigarettes and e-cigarettes. If you need help quitting, ask your health care provider.  Monitor your blood pressure at home as told by your health care provider.  Keep all follow-up visits as told by your health care provider. This is important. Medicines  Take over-the-counter and prescription medicines only as told by your health care provider. Follow directions carefully. Blood pressure medicines must be taken as prescribed.  Do not skip doses of blood pressure medicine. Doing this puts you at risk for problems and can make the medicine less effective.  Ask your health care provider about side effects or reactions to medicines that you should watch for. Contact a health care provider if:  You think you are having a reaction to a medicine you are taking.  You have headaches that keep coming back (recurring).  You feel dizzy.  You have swelling in your ankles.  You have  trouble with your vision. Get help right away if:  You develop a severe headache or confusion.  You have unusual weakness or numbness.  You feel faint.  You have severe pain in your chest or abdomen.  You vomit repeatedly.  You have trouble breathing. Summary  Hypertension is when the force of blood pumping through your arteries is too strong. If this condition is not controlled, it may put you at risk for serious complications.  Your personal target blood pressure may vary depending on your medical conditions, your age, and other factors. For most people, a normal blood pressure is less than 120/80.  Hypertension is treated with lifestyle changes, medicines, or a combination of both. Lifestyle changes include weight loss, eating a healthy, low-sodium diet, exercising more, and limiting alcohol. This information is not intended to replace  advice given to you by your health care provider. Make sure you discuss any questions you have with your health care provider. Document Released: 11/02/2005 Document Revised: 09/30/2016 Document Reviewed: 09/30/2016 Elsevier Interactive Patient Education  [employer].

## 2018-08-31 NOTE — Assessment & Plan Note (Signed)
hgba1c acceptable, minimize simple carbs. Increase exercise as tolerated. Continue current meds 

## 2018-08-31 NOTE — Assessment & Plan Note (Signed)
On Levothyroxine, continue to monitor 

## 2018-08-31 NOTE — Assessment & Plan Note (Signed)
Encouraged DASH diet, decrease po intake and increase exercise as tolerated. Needs 7-8 hours of sleep nightly. Avoid trans fats, eat small, frequent meals every 4-5 hours with lean proteins, complex carbs and healthy fats. Minimize simple carbs, GMO foods. 

## 2018-08-31 NOTE — Assessment & Plan Note (Signed)
Tolerating statin, encouraged heart healthy diet, avoid trans fats, minimize simple carbs and saturated fats. Increase exercise as tolerated 

## 2018-08-31 NOTE — Assessment & Plan Note (Signed)
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

## 2018-08-31 NOTE — Progress Notes (Signed)
Subjective:    Patient ID: Crystal [name], female    DOB: 22-Feb-1934, 84 y.o.   MRN: [id number]  No chief complaint on file.   HPI Patient is in today for follow up. She feels well today. She acknowledges she is very tired and she is staying up late [place]. Denies CP/palp/SOB/HA/congestion/fevers/GI or GU c/o. Taking meds as prescribed  Past Medical History:  Diagnosis Date  . Anemia 08/21/2017  . Anxiety   . Cancer ([place]) 11/17/1999   non hodgkins lymphoma-Dr. Marin Olp  . Cardiomyopathy    non ischemic NL cos on cath 08/2009, EF to 15-20%, Her follow up  2D echo  in 10/2009 showed impoved EFo 35-40%  . CHF (congestive heart failure) ([place])   . Full dentures   . History of lymphoma    Dr Martha Clan  . Hypercalcemia 04/20/2017  . Hypertension   . Hypothyroid   . Lung mass   . Medicare annual wellness visit, subsequent 12/14/2014   Follows with Dr Marin Olp Follows with Dr Deatra Ina, gastroenterology, last colonoscopy in 2012, repeat in 2017 Last Pap 2011, always normal, no need for repeat Last MGM in 2011, no concerns, declines for now Follows with Dr Stanford Breed of cardiology   . Vitamin D deficiency 10/17/2015  . Wears glasses     Past Surgical History:  Procedure Laterality Date  . DILATION AND CURETTAGE OF UTERUS    . nasal polyps removed     age 24  . teeth extraction    . TONSILLECTOMY  1952  . TONSILLECTOMY    . VIDEO BRONCHOSCOPY WITH ENDOBRONCHIAL NAVIGATION Right 06/29/2018   Procedure: VIDEO BRONCHOSCOPY WITH ENDOBRONCHIAL NAVIGATION;  Surgeon: Collene Gobble, MD;  Location: [facility];  Service: Thoracic;  Laterality: Right;  Marland Kitchen VIDEO BRONCHOSCOPY WITH ENDOBRONCHIAL ULTRASOUND Right 06/29/2018   Procedure: VIDEO BRONCHOSCOPY WITH ENDOBRONCHIAL ULTRASOUND;  Surgeon: Collene Gobble, MD;  Location: [facility];  Service: Thoracic;  Laterality: Right;    Family History  Problem Relation Age of Onset  . Diabetes Mother        deceased secondary to diabetes  . Hypertension Mother   .  Vision loss Mother   . Pneumonia Father        died age 46 due to complications of pneumonia  . Colon polyps Father   . Liver disease Daughter   . Sarcoidosis Daughter   . Diabetes Sister   . Colon cancer Brother 73  . Cancer Brother   . Obesity Son   . Diabetes Brother   . Kidney disease Brother   . Diabetes Sister   . Sarcoidosis Sister   . Arthritis Sister   . Arthritis Sister   . Diabetes Sister   . Arthritis Sister   . Diabetes Sister   . Hypertension Daughter   . Hypertension Daughter   . Other Unknown        no obvious premature cardiovascular disease or familial cardiomyopathy is noted  . Alcohol abuse Neg Hx   . Heart disease Neg Hx     Social History   Socioeconomic History  . Marital status: Married    Spouse name: Jeneen Rinks  . Number of children: 5  . Years of education: Not on file  . Highest education level: Not on file  Occupational History  . Occupation: retired  Scientific laboratory technician  . Financial resource strain: Not on file  . Food insecurity:    Worry: Not on file    Inability: Not on file  . Transportation  needs:    Medical: Not on file    Non-medical: Not on file  Tobacco Use  . Smoking status: Never Smoker  . Smokeless tobacco: Never Used  . Tobacco comment: never used tobacco  Substance and Sexual Activity  . Alcohol use: No    Alcohol/week: 0.0 standard drinks  . Drug use: No  . Sexual activity: Not on file    Comment: lives with husband, no dietary restrictions, minimizes dairy  Lifestyle  . Physical activity:    Days per week: Not on file    Minutes per session: Not on file  . Stress: Not on file  Relationships  . Social connections:    Talks on phone: Not on file    Gets together: Not on file    Attends religious service: Not on file    Active member of club or organization: Not on file    Attends meetings of clubs or organizations: Not on file    Relationship status: Not on file  . Intimate partner violence:    Fear of current or ex  partner: Not on file    Emotionally abused: Not on file    Physically abused: Not on file    Forced sexual activity: Not on file  Other Topics Concern  . Not on file  Social History Narrative   The patient is married ( husband Joletta Manner)  She just recently celebrated     her 52nd anniversary.  She has five children.  There is no active     tobacco or alcohol use history.          Smoking Status:  never   Caffeine use/day:  None   Does Patient Exercise:  yes    Outpatient Medications Prior to Visit  Medication Sig Dispense Refill  . carvedilol (COREG) 6.25 MG tablet Take 1 tablet (6.25 mg total) by mouth 2 (two) times daily. 180 tablet 3  . furosemide (LASIX) 20 MG tablet Take 1 tablet (20 mg total) by mouth daily as needed for fluid or edema (do not take if blood pressure is  low or any vomiting or diarrhea). 90 tablet 1  . hydrALAZINE (APRESOLINE) 10 MG tablet TAKE 1 TABLET (10 MG TOTAL) BY MOUTH 3 (THREE) TIMES DAILY. 270 tablet 3  . isosorbide mononitrate (IMDUR) 30 MG 24 hr tablet Take 0.5 tablets (15 mg total) by mouth daily. 45 tablet 3  . levothyroxine (SYNTHROID, LEVOTHROID) 25 MCG tablet Take 1 tablet (25 mcg total) by mouth daily before breakfast. 30 tablet 3  . Multiple Vitamins-Minerals (CENTRUM PO) Take 1 tablet by mouth daily.     . Omega-3 Fatty Acids (FISH OIL) 1000 MG CAPS Take 1,000 mg by mouth daily.    . Probiotic Product (PROBIOTIC DAILY PO) Take 1 capsule by mouth daily.     . raNITIdine HCl (ACID REDUCER PO) Take 1 tablet by mouth daily as needed (acid reflux).    Marland Kitchen 0.9 %  sodium chloride infusion      No facility-administered medications prior to visit.     Allergies  Allergen Reactions  . Lisinopril Swelling    Facial and lip swelling  . Iohexol Itching         Review of Systems  Constitutional: Negative for fever and malaise/fatigue.  HENT: Negative for congestion.   Eyes: Negative for blurred vision.  Respiratory: Negative for shortness of  breath.   Cardiovascular: Negative for chest pain, palpitations and leg swelling.  Gastrointestinal: Negative for abdominal pain,  blood in stool and nausea.  Genitourinary: Negative for dysuria and frequency.  Musculoskeletal: Negative for falls.  Skin: Negative for rash.  Neurological: Negative for dizziness, loss of consciousness and headaches.  Endo/Heme/Allergies: Negative for environmental allergies.  Psychiatric/Behavioral: Negative for depression. The patient is not nervous/anxious.        Objective:    Physical Exam  Constitutional: She is oriented to person, place, and time. She appears well-developed and well-nourished. No distress.  HENT:  Head: Normocephalic and atraumatic.  Eyes: Conjunctivae are normal.  Neck: Neck supple. No thyromegaly present.  Cardiovascular: Normal rate, regular rhythm and normal heart sounds.  No murmur heard. Pulmonary/Chest: Effort normal and breath sounds normal. No respiratory distress.  Abdominal: Soft. Bowel sounds are normal. She exhibits no distension and no mass. There is no tenderness.  Musculoskeletal: She exhibits no edema.  Lymphadenopathy:    She has no cervical adenopathy.  Neurological: She is alert and oriented to person, place, and time.  Skin: Skin is warm and dry.  Psychiatric: She has a normal mood and affect. Her behavior is normal.    BP 120/70 (BP Location: Left Arm, Patient Position: Sitting, Cuff Size: Normal)   Pulse 65   Temp 97.8 F (36.6 C) (Oral)   Resp 18   Wt 134 lb 6.4 oz (61 kg)   SpO2 95%   BMI 23.81 kg/m  Wt Readings from Last 3 Encounters:  08/29/18 134 lb 6.4 oz (61 kg)  06/29/18 133 lb (60.3 kg)  06/24/18 134 lb 11.2 oz (61.1 kg)     Lab Results  Component Value Date   WBC 2.9 (L) 08/29/2018   HGB 11.8 (L) 08/29/2018   HCT 36.3 08/29/2018   PLT 146.0 (L) 08/29/2018   GLUCOSE 116 (H) 08/29/2018   CHOL 172 08/29/2018   TRIG 98.0 08/29/2018   HDL 55.90 08/29/2018   LDLCALC 97 08/29/2018    ALT 34 08/29/2018   AST 24 08/29/2018   NA 138 08/29/2018   K 4.1 08/29/2018   CL 103 08/29/2018   CREATININE 1.25 (H) 08/29/2018   BUN 17 08/29/2018   CO2 35 (H) 08/29/2018   TSH 2.56 08/29/2018   INR 1.08 09/05/2009   HGBA1C 5.5 08/29/2018    Lab Results  Component Value Date   TSH 2.56 08/29/2018   Lab Results  Component Value Date   WBC 2.9 (L) 08/29/2018   HGB 11.8 (L) 08/29/2018   HCT 36.3 08/29/2018   MCV 89.7 08/29/2018   PLT 146.0 (L) 08/29/2018   Lab Results  Component Value Date   NA 138 08/29/2018   K 4.1 08/29/2018   CHLORIDE 101 01/21/2017   CO2 35 (H) 08/29/2018   GLUCOSE 116 (H) 08/29/2018   BUN 17 08/29/2018   CREATININE 1.25 (H) 08/29/2018   BILITOT 0.3 08/29/2018   ALKPHOS 119 (H) 08/29/2018   AST 24 08/29/2018   ALT 34 08/29/2018   PROT 8.2 08/29/2018   ALBUMIN 3.7 08/29/2018   CALCIUM 9.8 08/29/2018   ANIONGAP 6 06/24/2018   EGFR 29 (L) 01/21/2017   GFR 52.68 (L) 08/29/2018   Lab Results  Component Value Date   CHOL 172 08/29/2018   Lab Results  Component Value Date   HDL 55.90 08/29/2018   Lab Results  Component Value Date   LDLCALC 97 08/29/2018   Lab Results  Component Value Date   TRIG 98.0 08/29/2018   Lab Results  Component Value Date   CHOLHDL 3 08/29/2018   Lab Results  Component Value Date   HGBA1C 5.5 08/29/2018       Assessment & Plan:   Problem List Items Addressed This Visit    Hypothyroidism - Primary    On Levothyroxine, continue to monitor      Relevant Orders   TSH (Completed)   Sedimentation rate (Completed)   Essential hypertension   Relevant Orders   CBC with Differential/Platelet (Completed)   Comprehensive metabolic panel (Completed)   TSH (Completed)   Nonischemic cardiomyopathy ([place])    Asymptomatic 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 Increase exercise as tolerated. Continue current  meds      Relevant Orders   Sedimentation rate (Completed)   Hemoglobin A1c (Completed)   Hyperlipidemia, mixed    Tolerating statin, encouraged heart healthy diet, avoid trans fats, minimize simple carbs and saturated fats. Increase exercise as tolerated      Relevant Orders   Lipid panel (Completed)   Vitamin D deficiency   IBS (irritable bowel syndrome)    Encouraged DASH diet, decrease po intake and increase exercise as tolerated. Needs 7-8 hours of sleep nightly. Avoid trans fats, eat small, frequent meals every 4-5 hours with lean proteins, complex carbs and healthy fats. Minimize simple carbs, GMO foods.       Other Visit Diagnoses    Elevated LDH       Relevant Orders   Lactate Dehydrogenase (LDH) (Completed)      I have discontinued Akira E. Drumgoole's raNITIdine HCl (ACID REDUCER PO). I am also having her maintain her Multiple Vitamins-Minerals (CENTRUM PO), Probiotic Product (PROBIOTIC DAILY PO), carvedilol, furosemide, isosorbide mononitrate, hydrALAZINE, levothyroxine, and Fish Oil. We will stop administering sodium chloride.  No orders of the defined types were placed in this encounter.    Penni Homans, MD

## 2018-10-12 IMAGING — DX DG CHEST 1V PORT
1 series · 1 of 1 positions shown · non-contrast
Comparison: CT scans dated 06/24/2018 and 05/24/2018

CLINICAL DATA: Cavitary mass in the left lung apex. Bilateral hilar
adenopathy. Status post bronchoscopy and biopsy.

EXAM:
PORTABLE CHEST 1 VIEW

[chest ap]
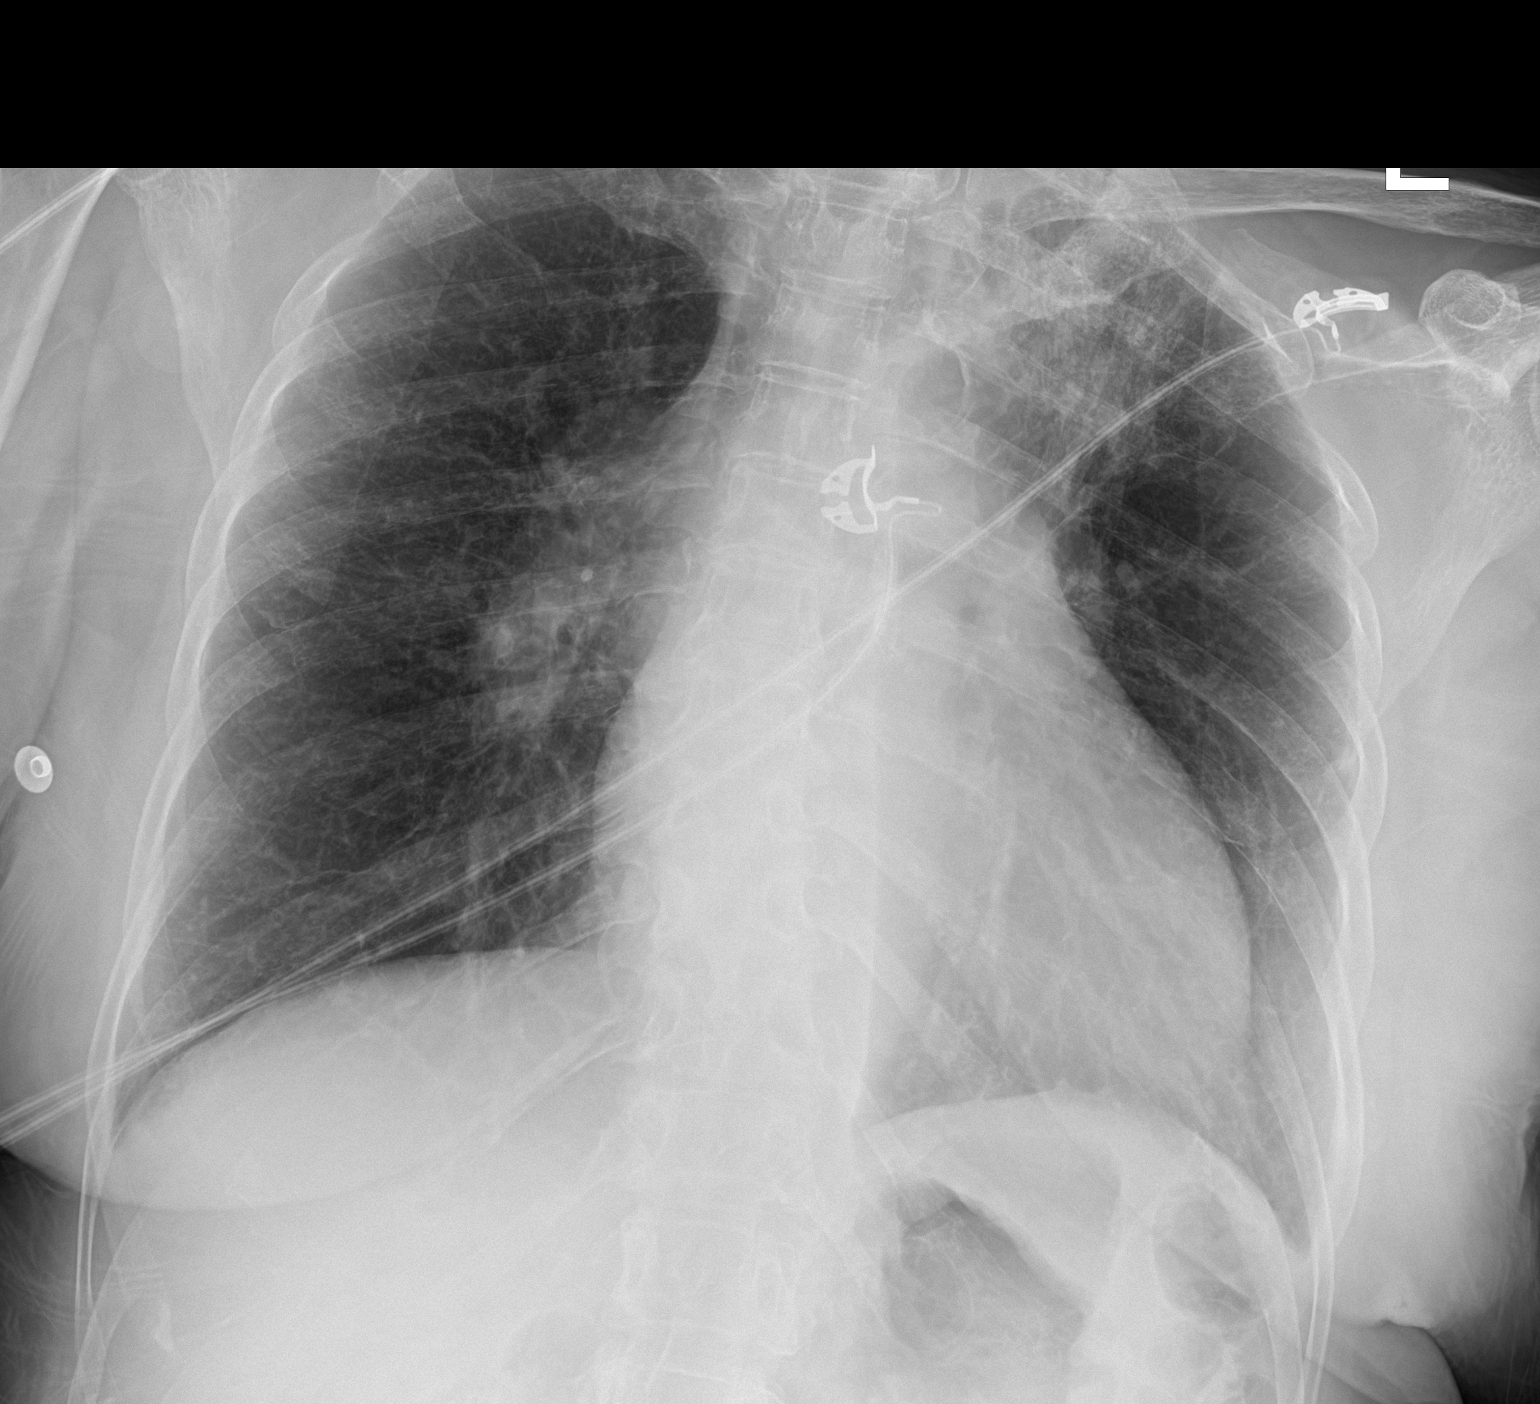

[1 of 1 positions shown; findings below may reference images not displayed]

FINDINGS: There is increased streaky density in the left lung apex at the site
of the cavitary mass demonstrated on the prior exams. No
pneumothorax.

Chronic prominence of the hilar structures bilaterally. Right lung
is otherwise clear. No effusions. No acute bone abnormality.
IMPRESSION: Increased density at the left lung apex which probably represents
some slight hemorrhage at the site of biopsy. Cavitary mass in the
left upper lobe is again noted but not well-defined on the current
exam.

Chronic bilateral hilar adenopathy.

## 2018-11-10 ENCOUNTER — Other Ambulatory Visit: Payer: Self-pay | Admitting: Family Medicine

## 2018-11-29 ENCOUNTER — Ambulatory Visit (INDEPENDENT_AMBULATORY_CARE_PROVIDER_SITE_OTHER): Payer: Medicare Other | Admitting: Family

## 2018-11-29 ENCOUNTER — Encounter: Payer: Self-pay | Admitting: Family

## 2018-11-29 ENCOUNTER — Emergency Department (HOSPITAL_BASED_OUTPATIENT_CLINIC_OR_DEPARTMENT_OTHER): Payer: Medicare Other

## 2018-11-29 ENCOUNTER — Other Ambulatory Visit: Payer: Self-pay

## 2018-11-29 ENCOUNTER — Encounter (HOSPITAL_BASED_OUTPATIENT_CLINIC_OR_DEPARTMENT_OTHER): Payer: Self-pay | Admitting: *Deleted

## 2018-11-29 ENCOUNTER — Inpatient Hospital Stay (HOSPITAL_BASED_OUTPATIENT_CLINIC_OR_DEPARTMENT_OTHER)
Admission: EM | Admit: 2018-11-29 | Discharge: 2018-12-03 | DRG: 291 | Disposition: A | Discharge status: Home / Self Care | Payer: Medicare Other | Attending: Internal Medicine | Admitting: Internal Medicine

## 2018-11-29 VITALS — BP 125/95 | HR 94 | Temp 98.4°F | Resp 20 | Ht 63.0 in | Wt 140.0 lb

## 2018-11-29 DIAGNOSIS — Z841 Family history of disorders of kidney and ureter: Secondary | ICD-10-CM | POA: Diagnosis not present

## 2018-11-29 DIAGNOSIS — I5023 Acute on chronic systolic (congestive) heart failure: Secondary | ICD-10-CM | POA: Diagnosis present

## 2018-11-29 DIAGNOSIS — J181 Lobar pneumonia, unspecified organism: Secondary | ICD-10-CM | POA: Diagnosis not present

## 2018-11-29 DIAGNOSIS — I509 Heart failure, unspecified: Secondary | ICD-10-CM | POA: Diagnosis not present

## 2018-11-29 DIAGNOSIS — I110 Hypertensive heart disease with heart failure: Secondary | ICD-10-CM | POA: Diagnosis not present

## 2018-11-29 DIAGNOSIS — N1831 Chronic kidney disease, stage 3a: Secondary | ICD-10-CM

## 2018-11-29 DIAGNOSIS — R55 Syncope and collapse: Secondary | ICD-10-CM

## 2018-11-29 DIAGNOSIS — N390 Urinary tract infection, site not specified: Secondary | ICD-10-CM | POA: Diagnosis present

## 2018-11-29 DIAGNOSIS — I959 Hypotension, unspecified: Secondary | ICD-10-CM | POA: Diagnosis present

## 2018-11-29 DIAGNOSIS — F419 Anxiety disorder, unspecified: Secondary | ICD-10-CM | POA: Diagnosis present

## 2018-11-29 DIAGNOSIS — R059 Cough, unspecified: Secondary | ICD-10-CM

## 2018-11-29 DIAGNOSIS — E039 Hypothyroidism, unspecified: Secondary | ICD-10-CM | POA: Diagnosis present

## 2018-11-29 DIAGNOSIS — I130 Hypertensive heart and chronic kidney disease with heart failure and stage 1 through stage 4 chronic kidney disease, or unspecified chronic kidney disease: Principal | ICD-10-CM | POA: Diagnosis present

## 2018-11-29 DIAGNOSIS — N183 Chronic kidney disease, stage 3 (moderate): Secondary | ICD-10-CM | POA: Diagnosis present

## 2018-11-29 DIAGNOSIS — C8331 Diffuse large B-cell lymphoma, lymph nodes of head, face, and neck: Secondary | ICD-10-CM | POA: Diagnosis present

## 2018-11-29 DIAGNOSIS — R0902 Hypoxemia: Secondary | ICD-10-CM | POA: Diagnosis not present

## 2018-11-29 DIAGNOSIS — Z91041 Radiographic dye allergy status: Secondary | ICD-10-CM | POA: Diagnosis not present

## 2018-11-29 DIAGNOSIS — Z79890 Hormone replacement therapy (postmenopausal): Secondary | ICD-10-CM

## 2018-11-29 DIAGNOSIS — A419 Sepsis, unspecified organism: Secondary | ICD-10-CM | POA: Diagnosis present

## 2018-11-29 DIAGNOSIS — Z888 Allergy status to other drugs, medicaments and biological substances status: Secondary | ICD-10-CM | POA: Diagnosis not present

## 2018-11-29 DIAGNOSIS — I10 Essential (primary) hypertension: Secondary | ICD-10-CM | POA: Diagnosis present

## 2018-11-29 DIAGNOSIS — J9601 Acute respiratory failure with hypoxia: Secondary | ICD-10-CM | POA: Diagnosis present

## 2018-11-29 DIAGNOSIS — Z79899 Other long term (current) drug therapy: Secondary | ICD-10-CM | POA: Diagnosis not present

## 2018-11-29 DIAGNOSIS — R05 Cough: Secondary | ICD-10-CM

## 2018-11-29 DIAGNOSIS — J189 Pneumonia, unspecified organism: Secondary | ICD-10-CM | POA: Diagnosis present

## 2018-11-29 DIAGNOSIS — Z8249 Family history of ischemic heart disease and other diseases of the circulatory system: Secondary | ICD-10-CM

## 2018-11-29 DIAGNOSIS — I5043 Acute on chronic combined systolic (congestive) and diastolic (congestive) heart failure: Secondary | ICD-10-CM | POA: Diagnosis present

## 2018-11-29 DIAGNOSIS — R0602 Shortness of breath: Secondary | ICD-10-CM | POA: Diagnosis not present

## 2018-11-29 HISTORY — DX: Depression, unspecified: F32.A

## 2018-11-29 HISTORY — DX: Non-Hodgkin lymphoma, unspecified, unspecified site: C85.90

## 2018-11-29 HISTORY — DX: Major depressive disorder, single episode, unspecified: F32.9

## 2018-11-29 LAB — TROPONIN I: Troponin I: <0.03 ng/mL (ref <0.03)

## 2018-11-29 LAB — COMPREHENSIVE METABOLIC PANEL
ALBUMIN: 2.9 g/dL — AB (ref 3.5–5.0)
ALK PHOS: 111 U/L (ref 38–126)
ALT: 51 U/L — AB (ref 0–44)
AST: 39 U/L (ref 15–41)
Anion gap: 7 (ref 5–15)
BUN: 20 mg/dL (ref 8–23)
CALCIUM: 9.1 mg/dL (ref 8.9–10.3)
CO2: 22 mmol/L (ref 22–32)
CREATININE: 1.22 mg/dL — AB (ref 0.44–1.00)
Chloride: 103 mmol/L (ref 98–111)
GFR calc Af Amer: 48 mL/min — ABNORMAL LOW (ref >60)
GFR calc non Af Amer: 41 mL/min — ABNORMAL LOW (ref >60)
GLUCOSE: 210 mg/dL — AB (ref 70–99)
Potassium: 3.8 mmol/L (ref 3.5–5.1)
SODIUM: 132 mmol/L — AB (ref 135–145)
TOTAL PROTEIN: 8.4 g/dL — AB (ref 6.5–8.1)
Total Bilirubin: 0.7 mg/dL (ref 0.3–1.2)

## 2018-11-29 LAB — URINALYSIS, ROUTINE W REFLEX MICROSCOPIC
BILIRUBIN URINE: NEGATIVE
Glucose, UA: NEGATIVE mg/dL
Ketones, ur: NEGATIVE mg/dL
NITRITE: POSITIVE — AB
Protein, ur: NEGATIVE mg/dL
SPECIFIC GRAVITY, URINE: 1.01 (ref 1.005–1.030)
pH: 6 (ref 5.0–8.0)

## 2018-11-29 LAB — URINALYSIS, MICROSCOPIC (REFLEX)

## 2018-11-29 LAB — CBC WITH DIFFERENTIAL/PLATELET
ABS IMMATURE GRANULOCYTES: 0.04 10*3/uL (ref 0.00–0.07)
BASOS ABS: 0 10*3/uL (ref 0.0–0.1)
Basophils Relative: 0 %
EOS PCT: 3 %
Eosinophils Absolute: 0.2 10*3/uL (ref 0.0–0.5)
HEMATOCRIT: 36.2 % (ref 36.0–46.0)
HEMOGLOBIN: 11.3 g/dL — AB (ref 12.0–15.0)
Immature Granulocytes: 1 %
LYMPHS ABS: 1.2 10*3/uL (ref 0.7–4.0)
LYMPHS PCT: 20 %
MCH: 28.2 pg (ref 26.0–34.0)
MCHC: 31.2 g/dL (ref 30.0–36.0)
MCV: 90.3 fL (ref 80.0–100.0)
MONO ABS: 0.8 10*3/uL (ref 0.1–1.0)
Monocytes Relative: 12 %
NEUTROS ABS: 3.9 10*3/uL (ref 1.7–7.7)
Neutrophils Relative %: 64 %
Platelets: 243 10*3/uL (ref 150–400)
RBC: 4.01 MIL/uL (ref 3.87–5.11)
RDW: 12.4 % (ref 11.5–15.5)
WBC: 6.1 10*3/uL (ref 4.0–10.5)
nRBC: 0 % (ref 0.0–0.2)

## 2018-11-29 LAB — BRAIN NATRIURETIC PEPTIDE: B Natriuretic Peptide: 2710.4 pg/mL — ABNORMAL HIGH (ref 0.0–100.0)

## 2018-11-29 LAB — LIPASE, BLOOD: Lipase: 26 U/L (ref 11–51)

## 2018-11-29 LAB — I-STAT CG4 LACTIC ACID, ED: Lactic Acid, Venous: 2.32 mmol/L (ref 0.5–1.9)

## 2018-11-29 LAB — CBG MONITORING, ED: Glucose-Capillary: 213 mg/dL — ABNORMAL HIGH (ref 70–99)

## 2018-11-29 LAB — TSH: TSH: 4.755 u[IU]/mL — ABNORMAL HIGH (ref 0.350–4.500)

## 2018-11-29 LAB — D-DIMER, QUANTITATIVE (NOT AT ARMC): D DIMER QUANT: 8.93 ug{FEU}/mL — AB (ref 0.00–0.50)

## 2018-11-29 MED ORDER — CENTRUM PO CHEW: 1.0000 | CHEWABLE_TABLET | Freq: Every day | ORAL | Status: DC

## 2018-11-29 MED ORDER — SODIUM CHLORIDE 0.9 % IV SOLN
1.0000 g | Freq: Once | INTRAVENOUS | Status: AC
  Administered 2018-11-29: 1 g via INTRAVENOUS
  Filled 2018-11-29: qty 10

## 2018-11-29 MED ORDER — ACETAMINOPHEN 650 MG RE SUPP: 650.0000 mg | Freq: Four times a day (QID) | RECTAL | Status: DC | PRN

## 2018-11-29 MED ORDER — IPRATROPIUM-ALBUTEROL 0.5-2.5 (3) MG/3ML IN SOLN
3.0000 mL | Freq: Four times a day (QID) | RESPIRATORY_TRACT | Status: DC
  Administered 2018-11-29 – 2018-12-01 (×7): 3 mL via RESPIRATORY_TRACT
  Filled 2018-11-29 (×7): qty 3

## 2018-11-29 MED ORDER — FISH OIL 1000 MG PO CAPS: 1000.0000 mg | ORAL_CAPSULE | Freq: Every day | ORAL | Status: DC

## 2018-11-29 MED ORDER — ADULT MULTIVITAMIN W/MINERALS CH
1.0000 | ORAL_TABLET | Freq: Every day | ORAL | Status: DC
  Administered 2018-11-30 – 2018-12-03 (×4): 1 via ORAL
  Filled 2018-11-29 (×5): qty 1

## 2018-11-29 MED ORDER — SODIUM CHLORIDE 0.9 % IV SOLN
500.0000 mg | INTRAVENOUS | Status: DC
  Administered 2018-11-30: 500 mg via INTRAVENOUS
  Filled 2018-11-29 (×2): qty 500

## 2018-11-29 MED ORDER — AZITHROMYCIN 500 MG IV SOLR
INTRAVENOUS | Status: AC
  Filled 2018-11-29: qty 500

## 2018-11-29 MED ORDER — ENOXAPARIN SODIUM 30 MG/0.3ML ~~LOC~~ SOLN
30.0000 mg | SUBCUTANEOUS | Status: DC
  Administered 2018-11-29 – 2018-12-02 (×4): 30 mg via SUBCUTANEOUS
  Filled 2018-11-29 (×4): qty 0.3

## 2018-11-29 MED ORDER — FUROSEMIDE 10 MG/ML IJ SOLN
20.0000 mg | Freq: Once | INTRAMUSCULAR | Status: AC
  Administered 2018-11-29: 20 mg via INTRAVENOUS
  Filled 2018-11-29: qty 2

## 2018-11-29 MED ORDER — FUROSEMIDE 10 MG/ML IJ SOLN
40.0000 mg | Freq: Two times a day (BID) | INTRAMUSCULAR | Status: DC
  Administered 2018-11-29 – 2018-11-30 (×2): 40 mg via INTRAVENOUS
  Filled 2018-11-29 (×2): qty 4

## 2018-11-29 MED ORDER — CARVEDILOL 6.25 MG PO TABS
6.2500 mg | ORAL_TABLET | Freq: Two times a day (BID) | ORAL | Status: DC
  Administered 2018-11-29 – 2018-12-03 (×8): 6.25 mg via ORAL
  Filled 2018-11-29 (×8): qty 1

## 2018-11-29 MED ORDER — LEVOTHYROXINE SODIUM 25 MCG PO TABS
25.0000 ug | ORAL_TABLET | Freq: Every day | ORAL | Status: DC
  Administered 2018-11-30 – 2018-12-03 (×3): 25 ug via ORAL
  Filled 2018-11-29 (×4): qty 1

## 2018-11-29 MED ORDER — ACETAMINOPHEN 325 MG PO TABS: 650.0000 mg | ORAL_TABLET | Freq: Four times a day (QID) | ORAL | Status: DC | PRN

## 2018-11-29 MED ORDER — SODIUM CHLORIDE 0.9 % IV SOLN
1.0000 g | INTRAVENOUS | Status: DC
  Administered 2018-11-30 – 2018-12-03 (×4): 1 g via INTRAVENOUS
  Filled 2018-11-29 (×4): qty 10

## 2018-11-29 MED ORDER — SODIUM CHLORIDE 0.9 % IV SOLN
500.0000 mg | Freq: Once | INTRAVENOUS | Status: AC
  Administered 2018-11-29: 500 mg via INTRAVENOUS
  Filled 2018-11-29: qty 500

## 2018-11-29 NOTE — ED Notes (Signed)
[facility] arrived to transport pt to [facility]. Pt reports feeling better

## 2018-11-29 NOTE — ED Notes (Signed)
Pt is hyperventilating.

## 2018-11-29 NOTE — ED Provider Notes (Signed)
[facility] EMERGENCY DEPARTMENT Provider Note   CSN: [id number] Arrival date & time: 11/29/18  1207     History   Chief Complaint Chief Complaint  Patient presents with  . Loss of Consciousness  . Hypoxia    HPI Crystal [name] is a 85 y.o. female.  The history is provided by the patient, medical records and a caregiver.  Cough  Cough characteristics:  Non-productive Sputum characteristics:  Nondescript Severity:  Severe Onset quality:  Gradual Duration:  1 week Timing:  Constant Progression:  Waxing and waning Chronicity:  New Relieved by:  Nothing Worsened by:  Nothing Ineffective treatments:  None tried Associated symptoms: chills, shortness of breath, sinus congestion and wheezing   Associated symptoms: no chest pain, no diaphoresis, no fever, no headaches, no rash and no rhinorrhea     Past Medical History:  Diagnosis Date  . Anemia 08/21/2017  . Anxiety   . Cancer ([place]) 11/17/1999   non hodgkins lymphoma-Dr. Marin Olp  . Cardiomyopathy    non ischemic NL cos on cath 08/2009, EF to 15-20%, Her follow up  2D echo  in 10/2009 showed impoved EFo 35-40%  . CHF (congestive heart failure) ([place])   . Full dentures   . History of lymphoma    Dr Martha Clan  . Hypercalcemia 04/20/2017  . Hypertension   . Hypothyroid   . Lung mass   . Medicare annual wellness visit, subsequent 12/14/2014   Follows with Dr Marin Olp Follows with Dr Deatra Ina, gastroenterology, last colonoscopy in 2012, repeat in 2017 Last Pap 2011, always normal, no need for repeat Last MGM in 2011, no concerns, declines for now Follows with Dr Stanford Breed of cardiology   . Vitamin D deficiency 10/17/2015  . Wears glasses     Patient Active Problem List   Diagnosis Date Noted  . Cavitating mass in left upper lung lobe 06/29/2018  . Hilar adenopathy 06/29/2018  . Abnormal CT of the chest 06/22/2018  . IBS (irritable bowel syndrome) 02/21/2018  . Anemia 08/21/2017  . Hypercalcemia 04/20/2017  .  Diffuse large B-cell lymphoma of lymph nodes of neck ([place]) 01/21/2017  . Mitral valve regurgitation, Moderate to severe 11/26/2015  . Chronic systolic heart failure ([place]) 11/26/2015  . Hyperlipidemia, mixed 10/17/2015  . Vitamin D deficiency 10/17/2015  . History of angioedema 06/11/2015  . Hematuria 06/11/2015  . Preventative health care 12/14/2014  . Hyperglycemia 03/24/2012  . Diverticulosis 10/01/2011  . Thrombocytopenia ([place]) 10/02/2010  . Hypothyroidism 01/24/2010  . RHEUMATOID FACTOR, POSITIVE 01/24/2010  . Non Hodgkin's lymphoma ([place]) 01/09/2010  . RAYNAUDS SYNDROME 01/09/2010  . ANXIETY 09/20/2009  . Essential hypertension 09/20/2009  . Nonischemic cardiomyopathy ([place]) 09/20/2009  . Congestive heart failure ([place]) 09/20/2009    Past Surgical History:  Procedure Laterality Date  . DILATION AND CURETTAGE OF UTERUS    . nasal polyps removed     age 80  . teeth extraction    . TONSILLECTOMY  1952  . TONSILLECTOMY    . VIDEO BRONCHOSCOPY WITH ENDOBRONCHIAL NAVIGATION Right 06/29/2018   Procedure: VIDEO BRONCHOSCOPY WITH ENDOBRONCHIAL NAVIGATION;  Surgeon: Collene Gobble, MD;  Location: [facility];  Service: Thoracic;  Laterality: Right;  Marland Kitchen VIDEO BRONCHOSCOPY WITH ENDOBRONCHIAL ULTRASOUND Right 06/29/2018   Procedure: VIDEO BRONCHOSCOPY WITH ENDOBRONCHIAL ULTRASOUND;  Surgeon: Collene Gobble, MD;  Location: [facility];  Service: Thoracic;  Laterality: Right;     OB History   No obstetric history on file.      Home Medications  Prior to Admission medications   Medication Sig Start Date End Date Taking? Authorizing Provider  carvedilol (COREG) 6.25 MG tablet Take 1 tablet (6.25 mg total) by mouth 2 (two) times daily. 02/21/18   Mosie Lukes, MD  furosemide (LASIX) 20 MG tablet Take 1 tablet (20 mg total) by mouth daily as needed for fluid or edema (do not take if blood pressure is  low or any vomiting or diarrhea). 02/21/18   Mosie Lukes, MD  hydrALAZINE (APRESOLINE) 10 MG  tablet TAKE 1 TABLET (10 MG TOTAL) BY MOUTH 3 (THREE) TIMES DAILY. 06/02/18   Lelon Perla, MD  isosorbide mononitrate (IMDUR) 30 MG 24 hr tablet Take 0.5 tablets (15 mg total) by mouth daily. 03/01/18   Lelon Perla, MD  levothyroxine (SYNTHROID, LEVOTHROID) 25 MCG tablet TAKE 1 TABLET BY MOUTH ONCE DAILY BEFORE BREAKFAST 11/11/18   Mosie Lukes, MD  Multiple Vitamins-Minerals (CENTRUM PO) Take 1 tablet by mouth daily.     [provider]  Omega-3 Fatty Acids (FISH OIL) 1000 MG CAPS Take 1,000 mg by mouth daily.    [provider]  Probiotic Product (PROBIOTIC DAILY PO) Take 1 capsule by mouth daily.     [provider]    Family History Family History  Problem Relation Age of Onset  . Diabetes Mother        deceased secondary to diabetes  . Hypertension Mother   . Vision loss Mother   . Pneumonia Father        died age 38 due to complications of pneumonia  . Colon polyps Father   . Liver disease Daughter   . Sarcoidosis Daughter   . Diabetes Sister   . Colon cancer Brother 66  . Cancer Brother   . Obesity Son   . Diabetes Brother   . Kidney disease Brother   . Diabetes Sister   . Sarcoidosis Sister   . Arthritis Sister   . Arthritis Sister   . Diabetes Sister   . Arthritis Sister   . Diabetes Sister   . Hypertension Daughter   . Hypertension Daughter   . Other Unknown        no obvious premature cardiovascular disease or familial cardiomyopathy is noted  . Alcohol abuse Neg Hx   . Heart disease Neg Hx     Social History Social History   Tobacco Use  . Smoking status: Never Smoker  . Smokeless tobacco: Never Used  . Tobacco comment: never used tobacco  Substance Use Topics  . Alcohol use: No    Alcohol/week: 0.0 standard drinks  . Drug use: No     Allergies   Lisinopril and Iohexol   Review of Systems Review of Systems  Constitutional: Positive for chills and fatigue. Negative for diaphoresis and fever.  HENT:  Positive for congestion. Negative for rhinorrhea.   Respiratory: Positive for cough, chest tightness, shortness of breath and wheezing. Negative for stridor.   Cardiovascular: Negative for chest pain, palpitations and leg swelling.  Gastrointestinal: Negative for abdominal pain.  Genitourinary: Negative for dysuria and frequency.  Musculoskeletal: Negative for back pain, neck pain and neck stiffness.  Skin: Negative for rash and wound.  Neurological: Positive for light-headedness. Negative for dizziness, syncope (naer syncope) and headaches.  Psychiatric/Behavioral: Negative for agitation.  All other systems reviewed and are negative.    Physical Exam Updated Vital Signs There were no vitals taken for this visit.  Physical Exam Vitals signs and nursing note reviewed.  Constitutional:      General: She is not in acute distress.    Appearance: She is well-developed. She is not ill-appearing, toxic-appearing or diaphoretic.  HENT:     Head: Normocephalic and atraumatic.     Nose: No congestion or rhinorrhea.     Mouth/Throat:     Pharynx: No oropharyngeal exudate or posterior oropharyngeal erythema.  Eyes:     Conjunctiva/sclera: Conjunctivae normal.     Pupils: Pupils are equal, round, and reactive to light.  Neck:     Musculoskeletal: Neck supple. No muscular tenderness.  Cardiovascular:     Rate and Rhythm: Normal rate and regular rhythm.     Heart sounds: No murmur.  Pulmonary:     Effort: No respiratory distress.     Breath sounds: Wheezing, rhonchi and rales present.  Chest:     Chest wall: No tenderness.  Abdominal:     General: Abdomen is flat. There is no distension.     Palpations: Abdomen is soft.     Tenderness: There is no abdominal tenderness.  Musculoskeletal:        General: No tenderness.     Right lower leg: Edema (muild) present.     Left lower leg: Edema (mild) present.  Skin:    General: Skin is warm and dry.     Capillary Refill: Capillary refill  takes less than 2 seconds.  Neurological:     General: No focal deficit present.     Mental Status: She is alert and oriented to person, place, and time.  Psychiatric:        Mood and Affect: Mood normal.      ED Treatments / Results  Labs (all labs ordered are listed, but only abnormal results are displayed) Labs Reviewed  CBC WITH DIFFERENTIAL/PLATELET - Abnormal; Notable for the following components:      Result Value   Hemoglobin 11.3 (*)    All other components within normal limits  COMPREHENSIVE METABOLIC PANEL - Abnormal; Notable for the following components:   Sodium 132 (*)    Glucose, Bld 210 (*)    Creatinine, Ser 1.22 (*)    Total Protein 8.4 (*)    Albumin 2.9 (*)    ALT 51 (*)    GFR calc non Af Amer 41 (*)    GFR calc Af Amer 48 (*)    All other components within normal limits  BRAIN NATRIURETIC PEPTIDE - Abnormal; Notable for the following components:   B Natriuretic Peptide 2,710.4 (*)    All other components within normal limits  D-DIMER, QUANTITATIVE (NOT AT [facility]) - Abnormal; Notable for the following components:   D-Dimer, Quant 8.93 (*)    All other components within normal limits  URINALYSIS, ROUTINE W REFLEX MICROSCOPIC - Abnormal; Notable for the following components:   APPearance HAZY (*)    Hgb urine dipstick TRACE (*)    Nitrite POSITIVE (*)    Leukocytes, UA MODERATE (*)    All other components within normal limits  URINALYSIS, MICROSCOPIC (REFLEX) - Abnormal; Notable for the following components:   Bacteria, UA MANY (*)    All other components within normal limits  I-STAT CG4 LACTIC ACID, ED - Abnormal; Notable for the following components:   Lactic Acid, Venous 2.32 (*)    All other components within normal limits  CBG MONITORING, ED - Abnormal; Notable for the following components:   Glucose-Capillary 213 (*)    All other components within normal limits  URINE CULTURE  CULTURE, BLOOD (ROUTINE X 2)  CULTURE, BLOOD (ROUTINE X 2)    LIPASE, BLOOD  TROPONIN I  TROPONIN I  TSH  I-STAT CG4 LACTIC ACID, ED    EKG EKG Interpretation  Date/Time:  Tuesday November 29 2018 12:30:05 EST Ventricular Rate:  77 PR Interval:    QRS Duration: 137 QT Interval:  377 QTC Calculation: 427 R Axis:   -51 Text Interpretation:  Sinus rhythm Multiform ventricular premature complexes RBBB and LAFB Left ventricular hypertrophy When compared to prior, more PVC present. new t wave inversion in lead 3.  No STEMI Confirmed by Antony Blackbird [phone]) on 11/29/2018 12:35:30 PM   Radiology Dg Chest Portable 1 View  Result Date: 11/29/2018 CLINICAL DATA:  Near syncope, hypoxia, shortness of breath and fatigue EXAM: PORTABLE CHEST 1 VIEW COMPARISON:  Portable chest x-ray of 06/29/2018 FINDINGS: The lungs are poorly aerated. There is abnormal opacity within the right upper lobe most consistent with pneumonia. Linear atelectasis or scarring is present at the left lung base and there may be small left pleural effusion present. Compared to the prior chest x-ray there does appear to be minimal opacity within the left upper lobe possibly due to scarring from the previous cavitary left apical lung lesion in addition, recurrence of tumor can not be excluded. Consider repeat CT of the chest. Cardiomegaly and mild pulmonary vascular congestion also can not be excluded. No acute bony abnormality is seen. IMPRESSION: 1. Right upper lobe opacity most consistent with pneumonia. 2. However there is cardiomegaly present with prominent interstitial markings and probable small effusion. Can not exclude superimposed CHF as well. 3. Opacity in the left upper lobe. Can not exclude recurrence of prior tumor. Consider CT of the chest with IV contrast media to assess further. Electronically Signed   By: Ivar Drape M.D.   On: 11/29/2018 12:51    Procedures Procedures (including critical care time)  CRITICAL CARE Performed by: Gwenyth Allegra Tegeler Total critical care  time: 45 minutes Critical care time was exclusive of separately billable procedures and treating other patients. Critical care was necessary to treat or prevent imminent or life-threatening deterioration. Critical care was time spent personally by me on the following activities: development of treatment plan with patient and/or surrogate as well as nursing, discussions with consultants, evaluation of patient's response to treatment, examination of patient, obtaining history from patient or surrogate, ordering and performing treatments and interventions, ordering and review of laboratory studies, ordering and review of radiographic studies, pulse oximetry and re-evaluation of patient's condition.   Medications Ordered in ED Medications  ipratropium-albuterol (DUONEB) 0.5-2.5 (3) MG/3ML nebulizer solution 3 mL (3 mLs Nebulization Given 11/29/18 1300)  cefTRIAXone (ROCEPHIN) 1 g in sodium chloride 0.9 % 100 mL IVPB (0 g Intravenous Stopped 11/29/18 1423)  azithromycin (ZITHROMAX) 500 mg in sodium chloride 0.9 % 250 mL IVPB (0 mg Intravenous Stopped 11/29/18 1500)  furosemide (LASIX) injection 20 mg (20 mg Intravenous Given 11/29/18 1348)  azithromycin (ZITHROMAX) 500 MG injection (  Return to [facility] 11/29/18 1356)  furosemide (LASIX) injection 20 mg (20 mg Intravenous Given 11/29/18 1353)     Initial Impression / Assessment and Plan / ED Course  I have reviewed the triage vital signs and the nursing notes.  Pertinent labs & imaging results that were available during my care of the patient were reviewed by me and considered in my medical decision making (see chart for details).     Crystal [name] is a 85 y.o. female with a  past medical history significant for CHF, prior lymphoma, lung mass, hypertension, hypothyroidism, and prior anemia who presents from her PCP office for hypoxia, shortness of breath, peripheral edema, cough, fatigue, and near syncopal episode.  Patient reports that for the last few  days she has been having worsening shortness of breath.  She was going to her PCP today when she had a near syncopal episode upstairs.  Patient was sent down quickly for evaluation.  She says her legs been more edematous and she has had a nonproductive cough.  She is been very short of breath worsened with exertion.  She reports some chills but no fevers.  She denies nausea vomiting, urinary symptoms or GI symptoms.  She does not think she passed out but was near syncopal upstairs.  She was found to be hypoxic with oxygen saturations in the 80s and was sent down for evaluation.  On exam, patient has faint wheezing.  Patient also has crackles in the bases of her lungs.  Left side has some coarse breath sounds compared to right.  Legs have mild edema.  Patient moving all extremities.  Abdomen and chest are nontender.  Patient is maintaining oxygen saturations in the mid 90s on 2 L nasal cannula.  For the wheezing, she will be given a breathing treatment.  Patient will work-up to look for CHF exacerbation versus cardiac versus PE given her history of lung mass, prior lymphoma, and the new hypoxia and tachycardia.  Patient will have a d-dimer to try and rule out PE as it appears she is allergic to the iohexol dye.  If d-dimer is positive, she will likely need VQ scan.  Patient will require admission for hypoxia and her syncopal episode.  Anticipate reassessment after work-up.  While awaiting diagnostic testing, patient began having more respiratory distress.  Patient was more tachypneic and was starting to pale.  Patient was less responsive.  Decision made to start patient on BiPAP given the high concern for fluid overload.  1:21 PM Patient has had significant improvement in her mental status and breathing with BiPAP.  She will remain on this.  Laboratory testing began to return showing elevated lactic acid 2.3.  No leukocytosis and mild anemia present.  Metabolic panel shows similarly elevated creatinine  of 1.2 however her BNP is elevated at 2700.  D-dimer is also elevated however she is not a candidate for a PE study.  Troponin still in process.  X-ray confirms pneumonia.  Patient will be treated for pneumonia and will be given diuretic for her fluid overload.  Patient will need admission for further management.  Will defer to admitting team for possibility of VQ scan if symptoms do not improve.  EKG continues to show a sinus rhythm with occasional PVCs.  No STEMI.  Patient will be admitted.   Final Clinical Impressions(s) / ED Diagnoses   Final diagnoses:  Near syncope  Hypoxia  Cough  Acute on chronic congestive heart failure, unspecified heart failure type ([place])  Community acquired pneumonia, unspecified laterality    Clinical Impression: 1. Near syncope   2. Hypoxia   3. Cough   4. Acute on chronic congestive heart failure, unspecified heart failure type ([place])   5. Community acquired pneumonia, unspecified laterality     Disposition: Admit  This note was prepared with assistance of Systems analyst. Occasional wrong-word or sound-a-like substitutions may have occurred due to the inherent limitations of voice recognition software.     Tegeler, Gwenyth Allegra, MD 11/29/18 [phone]

## 2018-11-29 NOTE — ED Notes (Signed)
Pt feeling much better.  Skin cool and dry.  Color improved.  Pt more alert.  Pox 100, RR decreasing steadily.

## 2018-11-29 NOTE — Care Management (Signed)
Contacted by Dr. Sherry Ruffing from [facility] regarding transfer of patient: Crystal [name] is an 85 year old female with a past medical history significant for systolic congestive heart failure with an ejection fraction of 40 to 45%, lymphoma, a lung mass in the left apex being treated by Dr. Marin Olp, hypothyroidism and anemia who presents from her primary care office for hypoxia and shortness of breath peripheral edema, cough, fatigue and near syncopal episode.  The patient reports that for the last few days she is been feeling worsening shortness of breath went to her PCP and was sent to the emergency department downstairs because she was hypoxic with O2 sats in the 80s.  In the emergency department physical examination consistent with congestive heart failure, and possible pneumonia.  Chest x-ray confirms pneumonia.  She was started on BiPAP due to air hunger and congestive heart failure.  She does need a CT scan but has an allergy to CT scan dye.  He does have a high d-dimer.  She received 40 mg of IV Lasix. On transfer here she will require: VQ scan to rule out PE,  management of congestive heart failure Admit for pneumonia.   She is being accepted to a S of care bed as she is on BiPAP

## 2018-11-29 NOTE — H&P (Signed)
History and Physical    EDLIN FORD ZOX:[id number] DOB: [birth year] DOA: 11/29/2018  PCP: Mosie Lukes, MD Patient coming from: PCPs office  Chief Complaint: Evaluation of hypoxia  HPI: Crystal [name] is a 85 y.o. female with medical history significant of anemia, chronic combined systolic and diastolic congestive heart failure, hypertension, hypothyroidism, non-Hodgkin's lymphoma presenting to the hospital for evaluation of shortness of breath, nonproductive cough, and bilateral pedal edema.  Patient states symptoms started a week ago.  Denies having any orthopnea.  States she has not been taking Lasix.  Denies having any fevers or chills.  Reports having fatigue for the past 1 week.  Denies having any calf pain, swelling, or erythema.  Denies any history of blood clots.  Denies having any chest pain.  States she is physically very active at baseline.  Review of Systems: As per HPI otherwise 10 point review of systems negative.  Past Medical History:  Diagnosis Date  . Anemia 08/21/2017  . Anxiety   . Cardiomyopathy    non ischemic NL cos on cath 08/2009, EF to 15-20%, Her follow up  2D echo  in 10/2009 showed impoved EFo 35-40%  . CHF (congestive heart failure) ([place])   . Depression   . Full dentures   . Hypercalcemia 04/20/2017  . Hypertension   . Hypothyroid   . Lung mass   . Medicare annual wellness visit, subsequent 12/14/2014   Follows with Dr Marin Olp Follows with Dr Deatra Ina, gastroenterology, last colonoscopy in 2012, repeat in 2017 Last Pap 2011, always normal, no need for repeat Last MGM in 2011, no concerns, declines for now Follows with Dr Stanford Breed of cardiology   . Non Hodgkin's lymphoma ([place]) 11/17/1999   -Dr. Marin Olp  . Vitamin D deficiency 10/17/2015  . Wears glasses     Past Surgical History:  Procedure Laterality Date  . DILATION AND CURETTAGE OF UTERUS    . MULTIPLE TOOTH EXTRACTIONS    . NASAL POLYP EXCISION  1962  . TONSILLECTOMY  1952  . VIDEO BRONCHOSCOPY  WITH ENDOBRONCHIAL NAVIGATION Right 06/29/2018   Procedure: VIDEO BRONCHOSCOPY WITH ENDOBRONCHIAL NAVIGATION;  Surgeon: Collene Gobble, MD;  Location: [facility];  Service: Thoracic;  Laterality: Right;  Marland Kitchen VIDEO BRONCHOSCOPY WITH ENDOBRONCHIAL ULTRASOUND Right 06/29/2018   Procedure: VIDEO BRONCHOSCOPY WITH ENDOBRONCHIAL ULTRASOUND;  Surgeon: Collene Gobble, MD;  Location: [facility];  Service: Thoracic;  Laterality: Right;     reports that she has never smoked. She has never used smokeless tobacco. She reports that she does not drink alcohol or use drugs.  Allergies  Allergen Reactions  . Lisinopril Swelling    Facial and lip swelling  . Iohexol Itching         Family History  Problem Relation Age of Onset  . Diabetes Mother        deceased secondary to diabetes  . Hypertension Mother   . Vision loss Mother   . Pneumonia Father        died age 9 due to complications of pneumonia  . Colon polyps Father   . Liver disease Daughter   . Sarcoidosis Daughter   . Diabetes Sister   . Colon cancer Brother 83  . Cancer Brother   . Obesity Son   . Diabetes Brother   . Kidney disease Brother   . Diabetes Sister   . Sarcoidosis Sister   . Arthritis Sister   . Arthritis Sister   . Diabetes Sister   . Arthritis Sister   .  Diabetes Sister   . Hypertension Daughter   . Hypertension Daughter   . Other Other        no obvious premature cardiovascular disease or familial cardiomyopathy is noted  . Alcohol abuse Neg Hx   . Heart disease Neg Hx     Prior to Admission medications   Medication Sig Start Date End Date Taking? Authorizing Provider  carvedilol (COREG) 6.25 MG tablet Take 1 tablet (6.25 mg total) by mouth 2 (two) times daily. 02/21/18   Mosie Lukes, MD  furosemide (LASIX) 20 MG tablet Take 1 tablet (20 mg total) by mouth daily as needed for fluid or edema (do not take if blood pressure is  low or any vomiting or diarrhea). 02/21/18   Mosie Lukes, MD  hydrALAZINE (APRESOLINE) 10  MG tablet TAKE 1 TABLET (10 MG TOTAL) BY MOUTH 3 (THREE) TIMES DAILY. 06/02/18   Lelon Perla, MD  isosorbide mononitrate (IMDUR) 30 MG 24 hr tablet Take 0.5 tablets (15 mg total) by mouth daily. 03/01/18   Lelon Perla, MD  levothyroxine (SYNTHROID, LEVOTHROID) 25 MCG tablet TAKE 1 TABLET BY MOUTH ONCE DAILY BEFORE BREAKFAST 11/11/18   Mosie Lukes, MD  Multiple Vitamins-Minerals (CENTRUM PO) Take 1 tablet by mouth daily.     [provider]  Omega-3 Fatty Acids (FISH OIL) 1000 MG CAPS Take 1,000 mg by mouth daily.    [provider]  Probiotic Product (PROBIOTIC DAILY PO) Take 1 capsule by mouth daily.     [provider]    Physical Exam: Vitals:   11/29/18 1746 11/29/18 2020 11/29/18 2021 11/29/18 2128  BP: 117/78 (!) 126/97    Pulse: 88 95  98  Resp: (!) 27 17 (!) 22 (!) 22  Temp:   98.2 F (36.8 C)   TempSrc:   Oral   SpO2: 100% 100%    Weight:      Height:        Physical Exam  Constitutional: She is oriented to person, place, and time. She appears well-developed and well-nourished. No distress.  HENT:  Head: Normocephalic.  Mouth/Throat: Oropharynx is clear and moist.  Eyes: Right eye exhibits no discharge. Left eye exhibits no discharge.  Neck: Neck supple. JVD present.  Mild JVD  Cardiovascular: Normal rate, regular rhythm and intact distal pulses.  Pulmonary/Chest: Effort normal. No respiratory distress.  Mild scattered end expiratory wheezing appreciated on auscultation Speaking clearly in full sentences No increased work of breathing On 3 L supplemental oxygen  Abdominal: Soft. Bowel sounds are normal. She exhibits no distension. There is no abdominal tenderness. There is no guarding.  Musculoskeletal:        General: Edema present.     Comments: +2 bilateral pedal edema  Neurological: She is alert and oriented to person, place, and time.  Tongue midline, no facial droop, speech fluent. Moving all extremities spontaneously.   No focal weakness.  Skin: Skin is warm and dry. She is not diaphoretic.  Psychiatric: She has a normal mood and affect. Her behavior is normal.     Labs on Admission: I have personally reviewed following labs and imaging studies  CBC: Recent Labs  Lab 11/29/18 1228  WBC 6.1  NEUTROABS 3.9  HGB 11.3*  HCT 36.2  MCV 90.3  PLT 973   Basic Metabolic Panel: Recent Labs  Lab 11/29/18 1228  NA 132*  K 3.8  CL 103  CO2 22  GLUCOSE 210*  BUN 20  CREATININE 1.22*  CALCIUM 9.1   GFR: Estimated Creatinine Clearance: 29.4 mL/min (A) (by C-G formula based on SCr of 1.22 mg/dL (H)). Liver Function Tests: Recent Labs  Lab 11/29/18 1228  AST 39  ALT 51*  ALKPHOS 111  BILITOT 0.7  PROT 8.4*  ALBUMIN 2.9*   Recent Labs  Lab 11/29/18 1228  LIPASE 26   No results for input(s): AMMONIA in the last 168 hours. Coagulation Profile: No results for input(s): INR, PROTIME in the last 168 hours. Cardiac Enzymes: Recent Labs  Lab 11/29/18 1228  TROPONINI <0.03   BNP (last 3 results) No results for input(s): PROBNP in the last 8760 hours. HbA1C: No results for input(s): HGBA1C in the last 72 hours. CBG: Recent Labs  Lab 11/29/18 1243  GLUCAP 213*   Lipid Profile: No results for input(s): CHOL, HDL, LDLCALC, TRIG, CHOLHDL, LDLDIRECT in the last 72 hours. Thyroid Function Tests: Recent Labs    11/29/18 1228  TSH 4.755*   Anemia Panel: No results for input(s): VITAMINB12, FOLATE, FERRITIN, TIBC, IRON, RETICCTPCT in the last 72 hours. Urine analysis:    Component Value Date/Time   COLORURINE YELLOW 11/29/2018 1410   APPEARANCEUR HAZY (A) 11/29/2018 1410   LABSPEC 1.010 11/29/2018 1410   PHURINE 6.0 11/29/2018 1410   GLUCOSEU NEGATIVE 11/29/2018 1410   GLUCOSEU NEGATIVE 10/20/2016 1119   HGBUR TRACE (A) 11/29/2018 1410   BILIRUBINUR NEGATIVE 11/29/2018 1410   BILIRUBINUR negative 06/22/2016 1047   BILIRUBINUR negative 06/11/2015 0953   KETONESUR NEGATIVE  11/29/2018 1410   PROTEINUR NEGATIVE 11/29/2018 1410   UROBILINOGEN 0.2 10/20/2016 1119   NITRITE POSITIVE (A) 11/29/2018 1410   LEUKOCYTESUR MODERATE (A) 11/29/2018 1410    Radiological Exams on Admission: Dg Chest Portable 1 View  Result Date: 11/29/2018 CLINICAL DATA:  Near syncope, hypoxia, shortness of breath and fatigue EXAM: PORTABLE CHEST 1 VIEW COMPARISON:  Portable chest x-ray of 06/29/2018 FINDINGS: The lungs are poorly aerated. There is abnormal opacity within the right upper lobe most consistent with pneumonia. Linear atelectasis or scarring is present at the left lung base and there may be small left pleural effusion present. Compared to the prior chest x-ray there does appear to be minimal opacity within the left upper lobe possibly due to scarring from the previous cavitary left apical lung lesion in addition, recurrence of tumor can not be excluded. Consider repeat CT of the chest. Cardiomegaly and mild pulmonary vascular congestion also can not be excluded. No acute bony abnormality is seen. IMPRESSION: 1. Right upper lobe opacity most consistent with pneumonia. 2. However there is cardiomegaly present with prominent interstitial markings and probable small effusion. Can not exclude superimposed CHF as well. 3. Opacity in the left upper lobe. Can not exclude recurrence of prior tumor. Consider CT of the chest with IV contrast media to assess further. Electronically Signed   By: Ivar Drape M.D.   On: 11/29/2018 12:51    EKG: Independently reviewed.  Sinus tachycardia (heart rate 102, PVCs, borderline PR prolongation.  No STEMI.  Assessment/Plan Principal Problem:   Acute respiratory failure with hypoxia (HCC) Active Problems:   Hypothyroidism   Essential hypertension   Diffuse large B-cell lymphoma of lymph nodes of neck (HCC)   Acute on chronic systolic CHF (congestive heart failure), NYHA class 4 (HCC)   CAP (community acquired pneumonia)   Sepsis ([place])   UTI (urinary  tract infection)   Near syncope   CKD (chronic kidney disease) stage 3, GFR 30-59 ml/min (HCC)   Acute hypoxic  respiratory failure secondary to acute exacerbation of chronic combined systolic and diastolic congestive heart failure and community-acquired pneumonia Tachycardic and tachypneic in the ED. Tachycardia has now resolved. Hypoxic with oxygen saturation in the 80s at her PCPs office. Initially placed on BiPAP in the ED.  Currently satting well on 3 L supplemental oxygen.   BNP elevated at 2710.  Chest x-ray with evidence of volume overload and right upper lobe opacity most consistent with pneumonia.  Has bilateral pedal edema on exam.  Last echo done in July 2018 showing EF 40 to 45% and grade 1 diastolic dysfunction. -Supplemental oxygen -Received IV Lasix 40 mg in the ED.  Continue IV Lasix 40 mg twice daily. -Monitor intake and output -Daily weights -Low-sodium diet with fluid restriction -Continue to monitor renal function with IV diuresis -Echocardiogram -Ceftriaxone and azithromycin -D-dimer elevated at 8.93.  Avoid CTA due to contrast allergy and has CKD.  Her respiratory status has improved with treatment of pneumonia and CHF exacerbation.  VQ scan in the morning to rule out PE. -Faint wheezing appreciated on exam.  DuoNebs every 6 hours.  Sepsis secondary to community-acquired pneumonia and urinary tract infection Tachycardic, tachypneic, and intermittently hypotensive in the ED.  Tachycardia has resolved and blood pressure now improved.  Afebrile and no leukocytosis.  Lactic acid mildly elevated at 2.3 in the setting of CKD 3.  Unable to give IV fluid due to acutely decompensated CHF.  UA suggestive of infection.  Chest x-ray showing right upper lobe opacity most consistent with pneumonia. -Continue ceftriaxone and azithromycin -Urine culture pending -Blood culture x2 pending  Near syncope Not reported to me by patient.  Per ED provider documentation, patient reported near  syncopal episode today while going upstairs at her PCPs office.  No loss of consciousness.  I-STAT troponin negative and EKG not suggestive of ACS.  Patient is not complaining of chest pain.  No focal neuro deficit.  Near syncope likely due to physical deconditioning from CHF exacerbation, pneumonia, and UTI.  In addition, hypoxia could also be contributing. -Cardiac monitoring -Echocardiogram  CKD 3 -Stable.  Creatinine 1.2, at baseline.  Hypothyroidism TSH borderline elevated.  -Check free T4 level  Left apical lung mass, history of non-Hodgkin's lymphoma Seen by oncology (Dr. Marin Olp).  Last note from July 06, 2018.  Per Dr. Antonieta Pert note, he was worried about malignancy and his plan was to speak to thoracic surgery. -Please make sure patient follows up with oncology after hospital discharge.  Hypertension Currently normotensive.  Continue home Coreg.  Continue IV diuresis as above.  Hold home hydralazine and Imdur at this time.  DVT prophylaxis: Lovenox (renally dosed) Code Status: Patient wishes to be full code. Family Communication: No family available. Disposition Plan: Anticipate discharge in 1 to 2 days. Consults called: None Admission status: It is my clinical opinion that admission to INPATIENT is reasonable and necessary in this 85 y.o. female . presenting with symptoms of shortness of breath, cough, concerning for pneumonia and acutely decompensated heart failure . in the context of PMH including: CHF . with pertinent positives on physical exam including: Supplemental oxygen requirement, hypoxia . and pertinent positives on radiographic and laboratory data including: Elevated d-dimer, imaging with evidence of pneumonia and volume overload . Workup and treatment include IV diuresis and IV antibiotics.  VQ scan to rule out PE.  Given the aforementioned, the predictability of an adverse outcome is felt to be significant. I expect that the patient will require at least 2  midnights  in the hospital to treat this condition.    Shela Leff MD [facility] Pager [phone]  If 7PM-7AM, please contact night-coverage [email or web address] Password [email or web address]  11/30/2018, 12:08 AM

## 2018-11-29 NOTE — ED Notes (Signed)
Dr. Sherry Ruffing notified of pending lactic acid level and BP readings.  He stated to cancel lactic acid and to continue to watch BP levels for now.

## 2018-11-29 NOTE — ED Notes (Signed)
Purewick placed on patient.

## 2018-11-29 NOTE — ED Notes (Signed)
Came into room.  RR 47, pt diaphoretic, moaning, pale, clammy, extremities cold.  Dr. Sherry Ruffing notified.  Pt placed on 100% NRB.  EKG leads re-applied.  Resp therapist at bedside.

## 2018-11-29 NOTE — ED Triage Notes (Signed)
Brought from Dr's office upstairs with c.o SOB. She went to the office for cough for a week. She attempted to walk into the office and could not make it the entire way due to dyspnea. Staff checked her sats, they were 88%. The staff placed her on 3l/mNC which brought her O2 sat to 96% and brought her to the ED. She felt like she was going to pass out prior to oxygen placement. Pt denies chest pain. She did not have a flu shot. Her ankles are swollen.

## 2018-11-29 NOTE — Progress Notes (Signed)
RT received pt from [facility] on BIPAP. RT removed BIPAP placed on standby. RT placed pt on 3L [place]. Pt tol well- pt in no distress-notified RN

## 2018-11-30 ENCOUNTER — Inpatient Hospital Stay (HOSPITAL_COMMUNITY): Payer: Medicare Other

## 2018-11-30 DIAGNOSIS — R55 Syncope and collapse: Secondary | ICD-10-CM | POA: Diagnosis present

## 2018-11-30 DIAGNOSIS — N183 Chronic kidney disease, stage 3 (moderate): Secondary | ICD-10-CM

## 2018-11-30 DIAGNOSIS — A419 Sepsis, unspecified organism: Secondary | ICD-10-CM | POA: Diagnosis present

## 2018-11-30 DIAGNOSIS — N390 Urinary tract infection, site not specified: Secondary | ICD-10-CM | POA: Diagnosis present

## 2018-11-30 DIAGNOSIS — C8331 Diffuse large B-cell lymphoma, lymph nodes of head, face, and neck: Secondary | ICD-10-CM

## 2018-11-30 DIAGNOSIS — N1831 Chronic kidney disease, stage 3a: Secondary | ICD-10-CM

## 2018-11-30 DIAGNOSIS — J181 Lobar pneumonia, unspecified organism: Secondary | ICD-10-CM

## 2018-11-30 DIAGNOSIS — R0602 Shortness of breath: Secondary | ICD-10-CM

## 2018-11-30 DIAGNOSIS — J189 Pneumonia, unspecified organism: Secondary | ICD-10-CM | POA: Diagnosis present

## 2018-11-30 LAB — BASIC METABOLIC PANEL
ANION GAP: 10 (ref 5–15)
BUN: 19 mg/dL (ref 8–23)
CO2: 24 mmol/L (ref 22–32)
Calcium: 9.2 mg/dL (ref 8.9–10.3)
Chloride: 105 mmol/L (ref 98–111)
Creatinine, Ser: 1.41 mg/dL — ABNORMAL HIGH (ref 0.44–1.00)
GFR calc non Af Amer: 35 mL/min — ABNORMAL LOW (ref >60)
GFR, EST AFRICAN AMERICAN: 40 mL/min — AB (ref >60)
Glucose, Bld: 99 mg/dL (ref 70–99)
Potassium: 3.6 mmol/L (ref 3.5–5.1)
SODIUM: 139 mmol/L (ref 135–145)

## 2018-11-30 LAB — ECHOCARDIOGRAM COMPLETE
Height: 63 in
WEIGHTICAEL: 2169.6 [oz_av]

## 2018-11-30 LAB — T4, FREE: Free T4: 1.35 ng/dL (ref 0.82–1.77)

## 2018-11-30 LAB — URINE CULTURE

## 2018-11-30 MED ORDER — POTASSIUM CHLORIDE CRYS ER 20 MEQ PO TBCR
20.0000 meq | EXTENDED_RELEASE_TABLET | Freq: Once | ORAL | Status: AC
  Administered 2018-11-30: 20 meq via ORAL
  Filled 2018-11-30: qty 1

## 2018-11-30 MED ORDER — GUAIFENESIN ER 600 MG PO TB12
600.0000 mg | ORAL_TABLET | Freq: Two times a day (BID) | ORAL | Status: DC
  Administered 2018-11-30 – 2018-12-02 (×5): 600 mg via ORAL
  Filled 2018-11-30 (×5): qty 1

## 2018-11-30 MED ORDER — TECHNETIUM TO 99M ALBUMIN AGGREGATED
4.0000 | Freq: Once | INTRAVENOUS | Status: AC | PRN
  Administered 2018-11-30: 4 via INTRAVENOUS

## 2018-11-30 MED ORDER — FUROSEMIDE 10 MG/ML IJ SOLN
20.0000 mg | Freq: Two times a day (BID) | INTRAMUSCULAR | Status: DC
  Administered 2018-11-30 – 2018-12-01 (×2): 20 mg via INTRAVENOUS
  Filled 2018-11-30 (×2): qty 2

## 2018-11-30 MED ORDER — TECHNETIUM TC 99M DIETHYLENETRIAME-PENTAACETIC ACID
30.0000 | Freq: Once | INTRAVENOUS | Status: AC | PRN
  Administered 2018-11-30: 30 via RESPIRATORY_TRACT

## 2018-11-30 NOTE — Evaluation (Signed)
Physical Therapy Evaluation Patient Details Name: Crystal [name] MRN: [id number] DOB: 21-Jul-1934 Today's Date: 11/30/2018   History of Present Illness  Pt is an 85 y.o. female admitted 11/29/18 with SOB and BLE edema. Worked up for acute hypoxic respiratory failure secondary to CHF exacerbation and CAP. Pt also with sepsis secondary to CAP and UTI. VQ scan with low probability for PE. PMH includes HTN, CKD3, CHF, cardiomyopathy, anxiety.    Clinical Impression  Pt presents with an overall decrease in functional mobility secondary to above. PTA, pt indep and lives with supportive husband; pt very active and participates in weekly hiking club. Today, pt ambulatory with min guard to minA for balance. Limited by DOE, requiring seated rest breaks after ambulating short distance. SpO2 down to 85% on RA, returning to 94% on 2L O2 [place]. Pt would benefit from continued acute PT services to maximize functional mobility and independence prior to d/c home.     Follow Up Recommendations No PT follow up;Supervision for mobility/OOB    Equipment Recommendations  (TBD)    Recommendations for Other Services       Precautions / Restrictions Precautions Precautions: Fall Precaution Comments: Significant DOE, watch SpO2 and HR Restrictions Weight Bearing Restrictions: No      Mobility  Bed Mobility Overal bed mobility: Modified Independent             General bed mobility comments: HOB elevated, use of bed rail. DOE 3/4 upon sitting EOB, requiring prolonged seated rest break SpO2 88-92% on RA  Transfers Overall transfer level: Needs assistance Equipment used: None Transfers: Sit to/from Stand Sit to Stand: Min guard         General transfer comment: Min guard for balance  Ambulation/Gait Ambulation/Gait assistance: Min guard;Min assist Gait Distance (Feet): 80 Feet Assistive device: None;1 person hand held assist Gait Pattern/deviations: Step-through pattern;Decreased stride  length;Staggering left;Staggering right Gait velocity: Decreased Gait velocity interpretation: 1.31 - 2.62 ft/sec, indicative of limited community ambulator General Gait Details: Slow, mildly unsteady amb without UE support, requiring intermittent minA to maintain balance; significant DOE requiring prolonged seated rest break, SpO2 down to 85% on RA, returning to >90% on 2L O2 [place]. Stability improved with use of hand rail for support, min guard for balance  Stairs            Wheelchair Mobility    Modified Rankin (Stroke Patients Only)       Balance Overall balance assessment: Needs assistance   Sitting balance-Leahy Scale: Good       Standing balance-Leahy Scale: Fair Standing balance comment: Can ambulate without UE support and min guard                             Pertinent Vitals/Pain Pain Assessment: No/denies pain    Home Living Family/patient expects to be discharged to:: Private residence Living Arrangements: Spouse/significant other Available Help at Discharge: Family;Available 24 hours/day Type of Home: House Home Access: Stairs to enter Entrance Stairs-Rails: Right Entrance Stairs-Number of Steps: 3 Home Layout: One level Home Equipment: None Additional Comments: Lives with husband who is a Environmental education officer PRN. Children live nearby    Prior Function Level of Independence: Independent         Comments: Indep without device. Is a member of a weekly hiking club; very active. Involved at church     Hand Dominance        Extremity/Trunk Assessment   Upper Extremity Assessment Upper Extremity  Assessment: Overall WFL for tasks assessed    Lower Extremity Assessment Lower Extremity Assessment: Overall WFL for tasks assessed       Communication   Communication: No difficulties  Cognition Arousal/Alertness: Awake/alert Behavior During Therapy: WFL for tasks assessed/performed Overall Cognitive Status: Within Functional Limits for tasks  assessed                                        General Comments General comments (skin integrity, edema, etc.): Son present at beginning of session, husband present at end of session    Exercises     Assessment/Plan    PT Assessment Patient needs continued PT services  PT Problem List Decreased activity tolerance;Decreased balance;Decreased mobility;Cardiopulmonary status limiting activity       PT Treatment Interventions DME instruction;Gait training;Stair training;Functional mobility training;Therapeutic activities;Therapeutic exercise;Balance training;Patient/family education    PT Goals (Current goals can be found in the Care Plan section)  Acute Rehab PT Goals Patient Stated Goal: Return home, get back to hiking club PT Goal Formulation: With patient Time For Goal Achievement: 12/14/18 Potential to Achieve Goals: Good    Frequency Min 3X/week   Barriers to discharge        Co-evaluation               AM-PAC PT "6 Clicks" Mobility  Outcome Measure Help needed turning from your back to your side while in a flat bed without using bedrails?: None Help needed moving from lying on your back to sitting on the side of a flat bed without using bedrails?: None Help needed moving to and from a bed to a chair (including a wheelchair)?: A Little Help needed standing up from a chair using your arms (e.g., wheelchair or bedside chair)?: A Little Help needed to walk in hospital room?: A Little Help needed climbing 3-5 steps with a railing? : A Little 6 Click Score: 20    End of Session Equipment Utilized During Treatment: Gait belt;Oxygen Activity Tolerance: Patient tolerated treatment well;Treatment limited secondary to medical complications (Comment)(DOE) Patient left: in bed;with call bell/phone within reach;with bed alarm set;with family/visitor present Nurse Communication: Mobility status PT Visit Diagnosis: Other abnormalities of gait and mobility  (R26.89)    Time: 2409-7353 PT Time Calculation (min) (ACUTE ONLY): 27 min   Charges:   PT Evaluation $PT Eval Moderate Complexity: 1 Mod PT Treatments $Gait Training: 8-22 mins      Mabeline Caras, PT, DPT Acute Rehabilitation Services  Pager [phone] Office [place] 11/30/2018, 5:11 PM

## 2018-11-30 NOTE — Care Management Note (Signed)
Case Management Note  Patient Details  Name: Crystal [name] MRN: [id number] Date of Birth: 02/27/1934  Subjective/Objective:  Pt presented for Acute Respiratory Failure and Positive for UTI. PTA from home with support of husband. Pt is very independent and active. No home needs identified at this time.                   Action/Plan: No further needs from CM at this time.   Expected Discharge Date:                  Expected Discharge Plan:  Home/Self Care  In-House Referral:  NA  Discharge planning Services  CM Consult  Post Acute Care Choice:  NA Choice offered to:  NA  DME Arranged:  N/A DME Agency:  NA  HH Arranged:  NA HH Agency:  NA  Status of Service:  Completed, signed off  If discussed at [place] of Stay Meetings, dates discussed:    Additional Comments:  Bethena Roys, RN 11/30/2018, 11:27 AM

## 2018-11-30 NOTE — Progress Notes (Signed)
  Echocardiogram 2D Echocardiogram has been performed.  Cloteal Isaacson G Marketa Midkiff 11/30/2018, 3:17 PM

## 2018-11-30 NOTE — Progress Notes (Signed)
PROGRESS NOTE    AZLIN ZILBERMAN  LNL:[id number] DOB: [birth year] DOA: 11/29/2018 PCP: Mosie Lukes, MD   Brief Narrative:  Crystal [name] is a 85 y.o. female with medical history significant of anemia, chronic combined systolic and diastolic congestive heart failure, hypertension, hypothyroidism, non-Hodgkin's lymphoma presenting to the hospital for evaluation of shortness of breath, nonproductive cough, and bilateral pedal edema.  Patient states symptoms started a week ago.  Denies having any orthopnea.  States she has not been taking Lasix.  Denies having any fevers or chills.  Reports having fatigue for the past 1 week.  Denies having any calf pain, swelling, or erythema.  Denies any history of blood clots.  Denies having any chest pain.  States she is physically very active at baseline.  Chest x-ray in the emergency room showed right-sided pneumonia.   Assessment & Plan:   Principal Problem:   Acute respiratory failure with hypoxia (HCC) Active Problems:   Hypothyroidism   Essential hypertension   Diffuse large B-cell lymphoma of lymph nodes of neck (HCC)   Acute on chronic systolic CHF (congestive heart failure), NYHA class 4 (HCC)   CAP (community acquired pneumonia)   Sepsis ([place])   UTI (urinary tract infection)   Near syncope   CKD (chronic kidney disease) stage 3, GFR 30-59 ml/min (HCC)   Acute hypoxic respiratory failure: secondary to acute exacerbation of chronic combined systolic and diastolic congestive heart failure and community-acquired pneumonia Tachycardic and tachypneic in the ED. Tachycardia has now resolved. Initially placed on BiPAP in the ED.  Currently satting well on 3 L supplemental oxygen.  Patient states she is not on oxygen at home. BNP elevated at 2710.  Chest x-ray with evidence of volume overload and right upper lobe opacity most consistent with pneumonia. Last echo done in July 2018 showing EF 40 to 45% and grade 1 diastolic dysfunction. -Continue  supplemental oxygen -Received IV Lasix 40 mg in the ED.   on IV Lasix 40 mg twice daily.  Will decrease the dose of Lasix as the creatinine is worsening. -Monitor intake and output -Daily weights -Low-sodium diet with fluid restriction -Continue to monitor renal function with IV diuresis -Echocardiogram -Continue ceftriaxone and azithromycin -D-dimer elevated at 8.93.    Unable to do CTA due to contrast allergy and has CKD.  -VQ scan negative for PE. DuoNebs every 6 hours. -We will consult speech/swallow evaluation to evaluate for aspiration.  Sepsis versus SIRS secondary to community-acquired pneumonia and urinary tract infection: Now resolved. -In the ED tachycardic, tachypneic, and intermittently hypotensive.  Tachycardia has resolved and blood pressure now improved.  Afebrile and no leukocytosis.  Lactic acid mildly elevated at 2.3 in the setting of CKD 3.  Unable to give IV fluid due to acutely decompensated CHF.  UA suggestive of infection.   -Continue ceftriaxone and azithromycin for pneumonia -Urine culture showing multiple bacterial morphotypes, none predominant. -Blood culture no growth  Near syncope Per ED provider documentation, patient reported near syncopal episode today while going upstairs at her PCPs office.  No loss of consciousness.  I-STAT troponin negative and EKG not suggestive of ACS.  Patient is not complaining of chest pain.  No focal neuro deficit.  Near syncope likely due to physical deconditioning from CHF exacerbation, pneumonia, and UTI.  In addition, hypoxia could also be contributing. -Continue cardiac monitoring -Echocardiogram  CKD 3 -Creatinine increased to 1.41 today likely from the diuretics.  Decrease the dose of Lasix.  Continue to monitor.  Hypothyroidism  TSH borderline elevated.  -Check free T4 level  Left apical lung mass, history of non-Hodgkin's lymphoma Seen by oncology (Dr. Marin Olp).  Last note from July 06, 2018.  Per Dr. Antonieta Pert  note, he was worried about malignancy and his plan was to speak to thoracic surgery. -Ppatient advised to follow up with oncology after hospital discharge.  Hypertension Currently normotensive.  Continue home Coreg.  Continue IV diuresis as above.  Hold home hydralazine and Imdur at this time. -Continue to monitor blood pressure closely and adjust medications as needed.   DVT prophylaxis: Lovenox (renal dose) Code Status: Full code Family Communication: No family at the bedside Disposition Plan: Home when stable  Consultants:   None  Procedures:   None  Antimicrobials:   Ceftriaxone, Azithromycin   Subjective: C/o shortness of breath. Cough but unable to produce sputum.  Objective: Vitals:   11/30/18 0545 11/30/18 0547 11/30/18 0919 11/30/18 1349  BP: 134/72 127/70    Pulse: 94 95    Resp: (!) 30     Temp:      TempSrc:      SpO2: 99% 96% 100% 100%  Weight:      Height:        Intake/Output Summary (Last 24 hours) at 11/30/2018 1508 Last data filed at 11/30/2018 1100 Gross per 24 hour  Intake 200 ml  Output 1900 ml  Net -1700 ml   Filed Weights   11/29/18 1220 11/29/18 1732 11/30/18 0129  Weight: 63.5 kg 62.1 kg 61.5 kg    Examination:  General exam: Appears calm and comfortable  Respiratory system: Decreased breath sounds lower lobes, has bronchial breath sounds on the right side. Cardiovascular system: S1 & S2 heard, RRR. No murmurs, rubs, gallops or clicks. pedal edema improving. Gastrointestinal system: Abdomen is nondistended, soft and nontender. No organomegaly or masses felt. Normal bowel sounds heard. Central nervous system: Alert and oriented. No focal neurological deficits. Extremities: Symmetric 5 x 5 power. Skin: No rashes, lesions or ulcers Psychiatry: Judgement and insight appear normal. Mood & affect appropriate.     Data Reviewed: I have personally reviewed following labs and imaging studies  CBC: Recent Labs  Lab 11/29/18 1228    WBC 6.1  NEUTROABS 3.9  HGB 11.3*  HCT 36.2  MCV 90.3  PLT 789   Basic Metabolic Panel: Recent Labs  Lab 11/29/18 1228 11/30/18 0419  NA 132* 139  K 3.8 3.6  CL 103 105  CO2 22 24  GLUCOSE 210* 99  BUN 20 19  CREATININE 1.22* 1.41*  CALCIUM 9.1 9.2   GFR: Estimated Creatinine Clearance: 25.4 mL/min (A) (by C-G formula based on SCr of 1.41 mg/dL (H)). Liver Function Tests: Recent Labs  Lab 11/29/18 1228  AST 39  ALT 51*  ALKPHOS 111  BILITOT 0.7  PROT 8.4*  ALBUMIN 2.9*   Recent Labs  Lab 11/29/18 1228  LIPASE 26   No results for input(s): AMMONIA in the last 168 hours. Coagulation Profile: No results for input(s): INR, PROTIME in the last 168 hours. Cardiac Enzymes: Recent Labs  Lab 11/29/18 1228  TROPONINI <0.03   BNP (last 3 results) No results for input(s): PROBNP in the last 8760 hours. HbA1C: No results for input(s): HGBA1C in the last 72 hours. CBG: Recent Labs  Lab 11/29/18 1243  GLUCAP 213*   Lipid Profile: No results for input(s): CHOL, HDL, LDLCALC, TRIG, CHOLHDL, LDLDIRECT in the last 72 hours. Thyroid Function Tests: Recent Labs    11/29/18 1228 11/30/18  0419  TSH 4.755*  --   FREET4  --  1.35   Anemia Panel: No results for input(s): VITAMINB12, FOLATE, FERRITIN, TIBC, IRON, RETICCTPCT in the last 72 hours. Sepsis Labs: Recent Labs  Lab 11/29/18 1310  LATICACIDVEN 2.32*    Recent Results (from the past 240 hour(s))  Blood culture (routine x 2)     Status: None (Preliminary result)   Collection Time: 11/29/18  1:32 PM  Result Value Ref Range Status   Specimen Description   Final    BLOOD RIGHT ANTECUBITAL Performed at [facility], [address] [place], [place] [address]    Special Requests   Final    BOTTLES DRAWN AEROBIC AND ANAEROBIC Blood Culture adequate volume Performed at [facility], [address] [place], [place] [address]    Culture   Final    NO GROWTH < 24 HOURS Performed at  [facility], [place] [address] [place], [place] [address]    Report Status PENDING  Incomplete  Blood culture (routine x 2)     Status: None (Preliminary result)   Collection Time: 11/29/18  1:40 PM  Result Value Ref Range Status   Specimen Description   Final    BLOOD LEFT ANTECUBITAL Performed at [facility], [address] [place], [place] [address]    Special Requests   Final    BOTTLES DRAWN AEROBIC AND ANAEROBIC Blood Culture adequate volume Performed at [facility], [address] [place], [place] [address]    Culture   Final    NO GROWTH < 24 HOURS Performed at [facility], [place] [address] [place], [place] [address]    Report Status PENDING  Incomplete  Urine culture     Status: None   Collection Time: 11/29/18  2:10 PM  Result Value Ref Range Status   Specimen Description   Final    URINE, CATHETERIZED Performed at [facility], [address] [place], [place] [address]    Special Requests   Final    NONE Performed at [facility], [address] [place], [place] [address]    Culture   Final    Multiple bacterial morphotypes present, none predominant. Suggest appropriate recollection if clinically indicated.   Report Status 11/30/2018 FINAL  Final         Radiology Studies: Nm Pulmonary Perf And Vent  Result Date: 11/30/2018 CLINICAL DATA:  Shortness of breath EXAM: NUCLEAR MEDICINE VENTILATION - PERFUSION LUNG SCAN VIEWS: Anterior, posterior, left lateral, right lateral, RPO, LPO, RAO, LAO-ventilation and perfusion RADIOPHARMACEUTICALS:  30.0 mCi of Tc-84m DTPA aerosol inhalation and 4.0 mCi Tc31m MAA IV COMPARISON:  Chest radiograph November 29, 2018 FINDINGS: Ventilation: There is photopenia in the posterior segment of the right upper lobe in the area of airspace consolidation seen on chest radiograph. Elsewhere, radiotracer uptake on the ventilation study is unremarkable. Perfusion: There is photopenia  in the posterior segment of the right upper lobe which matches the ventilation defect and corresponds to infiltrate seen on chest radiograph. There is apparent fluid in each major fissure. No other segmental tight perfusion defect evident. IMPRESSION: Matching ventilation and perfusion defect in the posterior segment of the right upper lobe with corresponding airspace consolidation on chest radiograph. No other appreciable perfusion defects evident. Fluid noted in each major fissure. Based on PIOPED II criteria, this study constitutes a low probability of pulmonary embolus.  Electronically Signed   By: Lowella Grip III M.D.   On: 11/30/2018 12:30   Dg Chest Portable 1 View  Result Date: 11/29/2018 CLINICAL DATA:  Near syncope, hypoxia, shortness of breath and fatigue EXAM: PORTABLE CHEST 1 VIEW COMPARISON:  Portable chest x-ray of 06/29/2018 FINDINGS: The lungs are poorly aerated. There is abnormal opacity within the right upper lobe most consistent with pneumonia. Linear atelectasis or scarring is present at the left lung base and there may be small left pleural effusion present. Compared to the prior chest x-ray there does appear to be minimal opacity within the left upper lobe possibly due to scarring from the previous cavitary left apical lung lesion in addition, recurrence of tumor can not be excluded. Consider repeat CT of the chest. Cardiomegaly and mild pulmonary vascular congestion also can not be excluded. No acute bony abnormality is seen. IMPRESSION: 1. Right upper lobe opacity most consistent with pneumonia. 2. However there is cardiomegaly present with prominent interstitial markings and probable small effusion. Can not exclude superimposed CHF as well. 3. Opacity in the left upper lobe. Can not exclude recurrence of prior tumor. Consider CT of the chest with IV contrast media to assess further. Electronically Signed   By: Ivar Drape M.D.   On: 11/29/2018 12:51        Scheduled Meds: .  carvedilol  6.25 mg Oral BID  . enoxaparin (LOVENOX) injection  30 mg Subcutaneous Q24H  . furosemide  40 mg Intravenous BID  . ipratropium-albuterol  3 mL Nebulization Q6H  . levothyroxine  25 mcg Oral Q0600  . multivitamin with minerals  1 tablet Oral Daily   Continuous Infusions: . azithromycin 500 mg (11/30/18 1226)  . cefTRIAXone (ROCEPHIN)  IV 1 g (11/30/18 1114)     LOS: 1 day    Time spent: 62 min    Adanely Reynoso, MD [facility] Pager on [place]  If 7PM-7AM, please contact night-coverage [email or web address] Password [email or web address] 11/30/2018, 3:08 PM

## 2018-11-30 NOTE — Progress Notes (Signed)
PT Cancellation Note  Patient Details Name: Crystal [name] MRN: [id number] DOB: 12-02-1933   Cancelled Treatment:    Reason Eval/Treat Not Completed: Other (comment). Elevated d dimer, awaiting VQ scan this AM to rule out PE. Will follow-up for PT evaluation.  Mabeline Caras, PT, DPT Acute Rehabilitation Services  Pager [phone] Office [place] 11/30/2018, 9:13 AM

## 2018-12-01 DIAGNOSIS — I10 Essential (primary) hypertension: Secondary | ICD-10-CM

## 2018-12-01 DIAGNOSIS — I5043 Acute on chronic combined systolic (congestive) and diastolic (congestive) heart failure: Secondary | ICD-10-CM

## 2018-12-01 DIAGNOSIS — J189 Pneumonia, unspecified organism: Secondary | ICD-10-CM

## 2018-12-01 DIAGNOSIS — R55 Syncope and collapse: Secondary | ICD-10-CM

## 2018-12-01 LAB — BASIC METABOLIC PANEL
Anion gap: 9 (ref 5–15)
BUN: 22 mg/dL (ref 8–23)
CALCIUM: 9.1 mg/dL (ref 8.9–10.3)
CO2: 24 mmol/L (ref 22–32)
Chloride: 107 mmol/L (ref 98–111)
Creatinine, Ser: 1.36 mg/dL — ABNORMAL HIGH (ref 0.44–1.00)
GFR calc Af Amer: 42 mL/min — ABNORMAL LOW (ref >60)
GFR, EST NON AFRICAN AMERICAN: 36 mL/min — AB (ref >60)
GLUCOSE: 99 mg/dL (ref 70–99)
POTASSIUM: 3.8 mmol/L (ref 3.5–5.1)
Sodium: 140 mmol/L (ref 135–145)

## 2018-12-01 LAB — CBC
HCT: 32.2 % — ABNORMAL LOW (ref 36.0–46.0)
Hemoglobin: 10.3 g/dL — ABNORMAL LOW (ref 12.0–15.0)
MCH: 28.5 pg (ref 26.0–34.0)
MCHC: 32 g/dL (ref 30.0–36.0)
MCV: 89.2 fL (ref 80.0–100.0)
Platelets: 201 10*3/uL (ref 150–400)
RBC: 3.61 MIL/uL — ABNORMAL LOW (ref 3.87–5.11)
RDW: 12.4 % (ref 11.5–15.5)
WBC: 4.5 10*3/uL (ref 4.0–10.5)
nRBC: 0 % (ref 0.0–0.2)

## 2018-12-01 MED ORDER — IPRATROPIUM-ALBUTEROL 0.5-2.5 (3) MG/3ML IN SOLN
3.0000 mL | RESPIRATORY_TRACT | Status: DC | PRN
  Administered 2018-12-01: 3 mL via RESPIRATORY_TRACT
  Filled 2018-12-01: qty 3

## 2018-12-01 MED ORDER — AZITHROMYCIN 250 MG PO TABS
500.0000 mg | ORAL_TABLET | Freq: Every day | ORAL | Status: DC
  Administered 2018-12-01 – 2018-12-03 (×3): 500 mg via ORAL
  Filled 2018-12-01 (×3): qty 2

## 2018-12-01 MED ORDER — LORAZEPAM 0.5 MG PO TABS
0.5000 mg | ORAL_TABLET | Freq: Two times a day (BID) | ORAL | Status: DC | PRN
  Administered 2018-12-01: 0.5 mg via ORAL
  Filled 2018-12-01: qty 1

## 2018-12-01 MED ORDER — FUROSEMIDE 10 MG/ML IJ SOLN
80.0000 mg | Freq: Two times a day (BID) | INTRAMUSCULAR | Status: DC
  Administered 2018-12-01: 80 mg via INTRAVENOUS
  Filled 2018-12-01: qty 8

## 2018-12-01 MED ORDER — SODIUM CHLORIDE 0.9 % IV SOLN
INTRAVENOUS | Status: DC | PRN
  Administered 2018-12-01 – 2018-12-03 (×2): via INTRAVENOUS

## 2018-12-01 MED ORDER — POTASSIUM CHLORIDE CRYS ER 20 MEQ PO TBCR
40.0000 meq | EXTENDED_RELEASE_TABLET | Freq: Every day | ORAL | Status: DC
  Administered 2018-12-01 – 2018-12-03 (×3): 40 meq via ORAL
  Filled 2018-12-01 (×3): qty 2

## 2018-12-01 MED ORDER — METHYLPREDNISOLONE SODIUM SUCC 40 MG IJ SOLR
40.0000 mg | Freq: Once | INTRAMUSCULAR | Status: AC
  Administered 2018-12-01: 40 mg via INTRAVENOUS
  Filled 2018-12-01: qty 1

## 2018-12-01 NOTE — Progress Notes (Signed)
Patient short of breath. RR=37. o2 sats upper 90's 1L.  o2 increased to 2 L Patient had bilateral expiratory wheezes. Patient repositioned in bed.  Scheduled IV lasix given. PRN Douneb given.  Little improvement noted. Dr Barth Kirks notified.  Verbal order received to give 40 mg IV solumedrol x 1 and to give 0.5 mg ativan every 12 hours prn.  Will continue to monitor.

## 2018-12-01 NOTE — Progress Notes (Signed)
PROGRESS NOTE    Crystal [name]  NWG:[id number] DOB: [birth year] DOA: 11/29/2018 PCP: Mosie Lukes, MD   Brief Narrative:  Crystal [name] is a 85 y.o. female with medical history significant of anemia, chronic combined systolic and diastolic congestive heart failure, hypertension, hypothyroidism, non-Hodgkin's lymphoma presenting to the hospital for evaluation of shortness of breath, nonproductive cough, and bilateral pedal edema.  Patient states symptoms started a week ago.  Denies having any orthopnea.  States she has not been taking Lasix.  Denies having any fevers or chills.  Reports having fatigue for the past 1 week.  Denies having any calf pain, swelling, or erythema.  Denies any history of blood clots.  Denies having any chest pain.  States she is physically very active at baseline.  Chest x-ray in the emergency room showed right-sided pneumonia.   Assessment & Plan:   Principal Problem:   Acute respiratory failure with hypoxia (HCC) Active Problems:   Hypothyroidism   Essential hypertension   Diffuse large B-cell lymphoma of lymph nodes of neck (HCC)   Acute on chronic systolic CHF (congestive heart failure), NYHA class 4 (HCC)   CAP (community acquired pneumonia)   Sepsis ([place])   UTI (urinary tract infection)   Near syncope   CKD (chronic kidney disease) stage 3, GFR 30-59 ml/min (HCC)   Acute hypoxic respiratory failure: secondary to acute exacerbation of chronic combined systolic and diastolic congestive heart failure and community-acquired pneumonia Tachycardic and tachypneic in the ED. Tachycardia has now resolved. Initially placed on BiPAP in the ED.  Currently satting well on supplemental oxygen.  Patient states she is not on oxygen at home. BNP elevated at 2710.  Chest x-ray with evidence of volume overload and right upper lobe opacity most consistent with pneumonia. Last echo done in July 2018 showing EF 40 to 45% and grade 1 diastolic  dysfunction. 12/01/2018: -Continue supplemental oxygen -On IV Lasix. -Monitor intake and output -Daily weights -Low-sodium diet with fluid restriction -Continue to monitor renal function with IV diuresis -Echocardiogram showing EF moderately reduced. EF in the range of 35% to 40%. Features are   consistent with a pseudonormal left ventricular filling pattern, with concomitant abnormal relaxation and increased filling debris in the diastolic heart failure pressure (grade 2 diastolic dysfunction).  Also noted was severe dilatation of the left atrium. -Given these echo findings consulted cardiology. -Continue ceftriaxone and azithromycin -D-dimer was elevated. Unable to do CTA due to contrast allergy and has CKD.  -VQ scan negative for PE.  -Continue DuoNebs. -Consult speech/swallow evaluation to evaluate for aspiration.  Sepsis versus SIRS secondary to community-acquired pneumonia and urinary tract infection: Sepsis resolved. - Tachycardia has resolved and blood pressure now improved.  Afebrile and no leukocytosis.  Lactic acid mildly elevated at 2.3 in the setting of CKD 3.  Unable to give IV fluid due to acutely decompensated CHF.    -Continue ceftriaxone and azithromycin for pneumonia -Urine culture showing multiple bacterial morphotypes, none predominant. -Blood culture no growth  Near syncope Per ED provider documentation, patient reported near syncopal episode today while going upstairs at her PCPs office.  No loss of consciousness. Troponin negative and EKG not suggestive of ACS.  Patient is not complaining of chest pain.  No focal neuro deficit.  Near syncope likely due to physical deconditioning from CHF exacerbation, pneumonia, and UTI.    CKD 3 -Creatinine increased to 1.41 therefore had to decrease the dose of Lasix.   12/01/2018: -Creatinine improved to 1.36.  Continue  to monitor.  Hypothyroidism TSH borderline elevated.  -free T4 normal.  Left apical lung mass,  history of non-Hodgkin's lymphoma Seen by oncology (Dr. Marin Olp).  Last note from July 06, 2018.  Per Dr. Antonieta Pert note, he was worried about malignancy and his plan was to speak to thoracic surgery. -Patient advised to follow up with oncology after hospital discharge.  Hypertension Currently normotensive.  Continue home Coreg.  Continue IV diuresis as above.  Held home hydralazine and Imdur due to hypotension at the time of presentation. -Continue to monitor blood pressure closely and adjust medications as needed.   DVT prophylaxis: Lovenox (renal dose) Code Status: Full code Family Communication: No family at the bedside Disposition Plan: Home when stable  Consultants:   Cardiology  Procedures:   None  Antimicrobials:   Ceftriaxone, Azithromycin   Subjective: C/o shortness of breath and cough.  She says it is improving.  Objective: Vitals:   11/30/18 2018 11/30/18 2108 12/01/18 0130 12/01/18 0502  BP: 108/72   124/89  Pulse: (!) 107 100 91 95  Resp: 15 (!) 27 (!) 25 (!) 27  Temp: 98.3 F (36.8 C)   97.9 F (36.6 C)  TempSrc: Oral   Oral  SpO2: 97% 100% 100% 100%  Weight:    61.4 kg  Height:        Intake/Output Summary (Last 24 hours) at 12/01/2018 1204 Last data filed at 12/01/2018 0602 Gross per 24 hour  Intake -  Output 1325 ml  Net -1325 ml   Filed Weights   11/29/18 1732 11/30/18 0129 12/01/18 0502  Weight: 62.1 kg 61.5 kg 61.4 kg    Examination:  General exam: Appears calm and comfortable  Respiratory system: Decreased breath sounds lower lobes, has bronchial breath sounds and some rhonchi on the right side. Cardiovascular system: S1 & S2 heard, RRR. No murmur. Pedal edema improving. Gastrointestinal system: Abdomen is nondistended, soft and nontender. No organomegaly or masses felt. Normal bowel sounds heard. Central nervous system: Alert and oriented. No focal neurological deficits. Extremities: Symmetric 5 x 5 power. Skin: No rashes,  lesions or ulcers Psychiatry: Judgement and insight appear normal. Mood & affect appropriate.     Data Reviewed: I have personally reviewed following labs and imaging studies  CBC: Recent Labs  Lab 11/29/18 1228 12/01/18 0455  WBC 6.1 4.5  NEUTROABS 3.9  --   HGB 11.3* 10.3*  HCT 36.2 32.2*  MCV 90.3 89.2  PLT 243 354   Basic Metabolic Panel: Recent Labs  Lab 11/29/18 1228 11/30/18 0419 12/01/18 0455  NA 132* 139 140  K 3.8 3.6 3.8  CL 103 105 107  CO2 22 24 24   GLUCOSE 210* 99 99  BUN 20 19 22   CREATININE 1.22* 1.41* 1.36*  CALCIUM 9.1 9.2 9.1   GFR: Estimated Creatinine Clearance: 26.4 mL/min (A) (by C-G formula based on SCr of 1.36 mg/dL (H)). Liver Function Tests: Recent Labs  Lab 11/29/18 1228  AST 39  ALT 51*  ALKPHOS 111  BILITOT 0.7  PROT 8.4*  ALBUMIN 2.9*   Recent Labs  Lab 11/29/18 1228  LIPASE 26   No results for input(s): AMMONIA in the last 168 hours. Coagulation Profile: No results for input(s): INR, PROTIME in the last 168 hours. Cardiac Enzymes: Recent Labs  Lab 11/29/18 1228  TROPONINI <0.03   BNP (last 3 results) No results for input(s): PROBNP in the last 8760 hours. HbA1C: No results for input(s): HGBA1C in the last 72 hours. CBG: Recent Labs  Lab 11/29/18 1243  GLUCAP 213*   Lipid Profile: No results for input(s): CHOL, HDL, LDLCALC, TRIG, CHOLHDL, LDLDIRECT in the last 72 hours. Thyroid Function Tests: Recent Labs    11/29/18 1228 11/30/18 0419  TSH 4.755*  --   FREET4  --  1.35   Anemia Panel: No results for input(s): VITAMINB12, FOLATE, FERRITIN, TIBC, IRON, RETICCTPCT in the last 72 hours. Sepsis Labs: Recent Labs  Lab 11/29/18 1310  LATICACIDVEN 2.32*    Recent Results (from the past 240 hour(s))  Blood culture (routine x 2)     Status: None (Preliminary result)   Collection Time: 11/29/18  1:32 PM  Result Value Ref Range Status   Specimen Description   Final    BLOOD RIGHT ANTECUBITAL Performed  at [facility], [address] [place], [place] [address]    Special Requests   Final    BOTTLES DRAWN AEROBIC AND ANAEROBIC Blood Culture adequate volume Performed at [facility], [address] [place], [place] [address]    Culture   Final    NO GROWTH 2 DAYS Performed at [facility], [place] [address], [place], [place] [address]    Report Status PENDING  Incomplete  Blood culture (routine x 2)     Status: None (Preliminary result)   Collection Time: 11/29/18  1:40 PM  Result Value Ref Range Status   Specimen Description   Final    BLOOD LEFT ANTECUBITAL Performed at [facility], [address] [place], [place] [address]    Special Requests   Final    BOTTLES DRAWN AEROBIC AND ANAEROBIC Blood Culture adequate volume Performed at [facility], [address] [place], [place] [address]    Culture   Final    NO GROWTH 2 DAYS Performed at [facility], [place] [address] [place], [place] [address]    Report Status PENDING  Incomplete  Urine culture     Status: None   Collection Time: 11/29/18  2:10 PM  Result Value Ref Range Status   Specimen Description   Final    URINE, CATHETERIZED Performed at [facility], [address] [place], [place] [address]    Special Requests   Final    NONE Performed at [facility], [address] [place], [place] [address]    Culture   Final    Multiple bacterial morphotypes present, none predominant. Suggest appropriate recollection if clinically indicated.   Report Status 11/30/2018 FINAL  Final         Radiology Studies: Nm Pulmonary Perf And Vent  Result Date: 11/30/2018 CLINICAL DATA:  Shortness of breath EXAM: NUCLEAR MEDICINE VENTILATION - PERFUSION LUNG SCAN VIEWS: Anterior, posterior, left lateral, right lateral, RPO, LPO, RAO, LAO-ventilation and perfusion RADIOPHARMACEUTICALS:  30.0 mCi of Tc-62m DTPA aerosol inhalation and 4.0 mCi Tc38m MAA IV  COMPARISON:  Chest radiograph November 29, 2018 FINDINGS: Ventilation: There is photopenia in the posterior segment of the right upper lobe in the area of airspace consolidation seen on chest radiograph. Elsewhere, radiotracer uptake on the ventilation study is unremarkable. Perfusion: There is photopenia in the posterior segment of the right upper lobe which matches the ventilation defect and corresponds to infiltrate seen on chest radiograph. There is apparent fluid in each major fissure. No other segmental tight perfusion defect evident. IMPRESSION: Matching ventilation and perfusion defect in the posterior segment of the right upper  lobe with corresponding airspace consolidation on chest radiograph. No other appreciable perfusion defects evident. Fluid noted in each major fissure. Based on PIOPED II criteria, this study constitutes a low probability of pulmonary embolus. Electronically Signed   By: Lowella Grip III M.D.   On: 11/30/2018 12:30   Dg Chest Portable 1 View  Result Date: 11/29/2018 CLINICAL DATA:  Near syncope, hypoxia, shortness of breath and fatigue EXAM: PORTABLE CHEST 1 VIEW COMPARISON:  Portable chest x-ray of 06/29/2018 FINDINGS: The lungs are poorly aerated. There is abnormal opacity within the right upper lobe most consistent with pneumonia. Linear atelectasis or scarring is present at the left lung base and there may be small left pleural effusion present. Compared to the prior chest x-ray there does appear to be minimal opacity within the left upper lobe possibly due to scarring from the previous cavitary left apical lung lesion in addition, recurrence of tumor can not be excluded. Consider repeat CT of the chest. Cardiomegaly and mild pulmonary vascular congestion also can not be excluded. No acute bony abnormality is seen. IMPRESSION: 1. Right upper lobe opacity most consistent with pneumonia. 2. However there is cardiomegaly present with prominent interstitial markings and  probable small effusion. Can not exclude superimposed CHF as well. 3. Opacity in the left upper lobe. Can not exclude recurrence of prior tumor. Consider CT of the chest with IV contrast media to assess further. Electronically Signed   By: Ivar Drape M.D.   On: 11/29/2018 12:51        Scheduled Meds: . azithromycin  500 mg Oral Daily  . carvedilol  6.25 mg Oral BID  . enoxaparin (LOVENOX) injection  30 mg Subcutaneous Q24H  . furosemide  20 mg Intravenous BID  . guaiFENesin  600 mg Oral BID  . levothyroxine  25 mcg Oral Q0600  . multivitamin with minerals  1 tablet Oral Daily   Continuous Infusions: . sodium chloride 10 mL/hr at 12/01/18 1055  . cefTRIAXone (ROCEPHIN)  IV 1 g (12/01/18 1057)     LOS: 2 days    Time spent: 25 min    Yaakov Guthrie, MD [facility] Pager on [place]  If 7PM-7AM, please contact night-coverage [email or web address] Password [facility] 12/01/2018, 12:04 PM

## 2018-12-01 NOTE — Progress Notes (Signed)
Physical Therapy Treatment Patient Details Name: Crystal [name] MRN: [id number] DOB: 1934/10/31 Today's Date: 12/01/2018    History of Present Illness Pt is an 85 y.o. female admitted 11/29/18 with SOB and BLE edema. Worked up for acute hypoxic respiratory failure secondary to CHF exacerbation and CAP. Pt also with sepsis secondary to CAP and UTI. VQ scan with low probability for PE. PMH includes HTN, CKD3, CHF, cardiomyopathy, anxiety.   PT Comments    Pt progressing with mobility. Able to increase amb distance this session with SpO2 >/88% on RA initially; after walking ~150', pt with worsening SOB and SpO2 down to 86%, pt rushing back to room to take a seat. SpO2 returning to 94% with seated rest on 1L O2 [place]. Increased time spent discussing energy conservation and self-monitoring of activity tolerance. Pt continues to state she is used to doing things quickly and caring for others; educ on current conditions and deficits. Will continue to follow acutely.   Follow Up Recommendations  No PT follow up;Supervision for mobility/OOB     Equipment Recommendations  (TBD)    Recommendations for Other Services       Precautions / Restrictions Precautions Precautions: Fall Precaution Comments: Significant DOE, watch SpO2 Restrictions Weight Bearing Restrictions: No    Mobility  Bed Mobility Overal bed mobility: Modified Independent             General bed mobility comments: Increased time and effort secondary to DOE with minimal activity. After walk and returning to bed, pt requiring rest break just while repositioning in bed due to SOB  Transfers Overall transfer level: Needs assistance Equipment used: None Transfers: Sit to/from Stand Sit to Stand: Supervision         General transfer comment: Stood from bed and toilet with supervision; DOE 2/4  Ambulation/Gait Ambulation/Gait assistance: Counsellor (Feet): 200 Feet Assistive device: None;1 person hand held  assist Gait Pattern/deviations: Step-through pattern;Decreased stride length Gait velocity: Decreased Gait velocity interpretation: 1.31 - 2.62 ft/sec, indicative of limited community ambulator General Gait Details: Initial slow, steady gait with intermittent min guard for balance and SpO2 >/88% on RA. Pt required prolonged standing rest break secondary to DOE. Amb back to room, pt with increased DOE and SpO2 down to 86%, pt rushing back to room despite cues to sit in hallway chair; increased time to recover after this with seated rest break   Stairs             Wheelchair Mobility    Modified Rankin (Stroke Patients Only)       Balance Overall balance assessment: Needs assistance   Sitting balance-Leahy Scale: Good       Standing balance-Leahy Scale: Fair                              Cognition Arousal/Alertness: Awake/alert Behavior During Therapy: WFL for tasks assessed/performed Overall Cognitive Status: Impaired/Different from baseline Area of Impairment: Safety/judgement                         Safety/Judgement: Decreased awareness of deficits     General Comments: Poor insight into current condition      Exercises      General Comments        Pertinent Vitals/Pain Pain Assessment: No/denies pain    Home Living  Prior Function            PT Goals (current goals can now be found in the care plan section) Acute Rehab PT Goals Patient Stated Goal: Return home, get back to hiking club PT Goal Formulation: With patient Time For Goal Achievement: 12/14/18 Potential to Achieve Goals: Good Progress towards PT goals: Progressing toward goals    Frequency    Min 3X/week      PT Plan Current plan remains appropriate    Co-evaluation              AM-PAC PT "6 Clicks" Mobility   Outcome Measure  Help needed turning from your back to your side while in a flat bed without using  bedrails?: None Help needed moving from lying on your back to sitting on the side of a flat bed without using bedrails?: None Help needed moving to and from a bed to a chair (including a wheelchair)?: None Help needed standing up from a chair using your arms (e.g., wheelchair or bedside chair)?: A Little Help needed to walk in hospital room?: A Little Help needed climbing 3-5 steps with a railing? : A Little 6 Click Score: 21    End of Session Equipment Utilized During Treatment: Gait belt;Oxygen Activity Tolerance: Patient tolerated treatment well;Treatment limited secondary to medical complications (Comment)(SOB) Patient left: in bed;with call bell/phone within reach;with family/visitor present Nurse Communication: Mobility status PT Visit Diagnosis: Other abnormalities of gait and mobility (R26.89)     Time: 6067-7034 PT Time Calculation (min) (ACUTE ONLY): 23 min  Charges:  $Gait Training: 8-22 mins $Self Care/Home Management: 8-22                    Mabeline Caras, PT, DPT Acute Rehabilitation Services  Pager [phone] Office [place] 12/01/2018, 3:51 PM

## 2018-12-01 NOTE — Progress Notes (Addendum)
Cardiology Consultation:   Patient ID: Crystal [name]; [id number]; 1934-10-22   Admit date: 11/29/2018 Date of Consult: 12/01/2018  Primary Care Provider: Mosie Lukes, MD Primary Cardiologist: [name], MD 12/23/2017 Primary Electrophysiologist:  None   Patient Profile:   Crystal [name] is a 85 y.o. female with a hx of NICM w/ EF 15-20% and nl cors 2010, EF 40-45% 2018 echo w/ mod-severe MR, RV function decreased, syncope (refused ICD), HTN, hypothyroid, lung mass, non-Hodgkin's lymphoma, who is being seen today for the evaluation of CHF at the request of Dr Barth Kirks.  History of Present Illness:   Crystal [name] was doing well at last office visit, wt 61.6 kg, no med changes, BP controlled.  She was admitted 01/14 with near-syncope, hypoxia req BiPAP, increased edema. PNA on CXR. Dx SIRS 2nd CAP, UTI. Echo abnl>>cards consult.  Crystal [name] has not had any chest pain.    She went to the drugstore and was out in the wind.  The wound was called and she developed a cough.  That was 9 days ago.  The cough got worse and she gradually developed orthopnea and PND.  She had some pedal edema, but no lower extremity edema.  She has not been weighing herself (says she can start) but felt her weight was decreasing because she was very busy and not eating very much.  She finally made an appointment with her PCP because over-the-counter cough medicine was not helping.  Her PCP sent her to the emergency room.  Since admission, she feels much better.  However, she still requires O2 and is short of breath with minimal activity.  She is not aware of the PVCs.  She has not had Lasix in a long time.  She looked at the bottle and realized it said to take as needed.  She never felt sure that she needed it.   Past Medical History:  Diagnosis Date  . Anemia 08/21/2017  . Anxiety   . Cardiomyopathy    non ischemic NL cos on cath 08/2009, EF to 15-20%, Her follow up  2D echo  in 10/2009 showed impoved EFo  35-40%  . CHF (congestive heart failure) ([place])   . Depression   . Full dentures   . Hypercalcemia 04/20/2017  . Hypertension   . Hypothyroid   . Lung mass   . Medicare annual wellness visit, subsequent 12/14/2014   Follows with Dr Marin Olp Follows with Dr Deatra Ina, gastroenterology, last colonoscopy in 2012, repeat in 2017 Last Pap 2011, always normal, no need for repeat Last MGM in 2011, no concerns, declines for now Follows with Dr Stanford Breed of cardiology   . Non Hodgkin's lymphoma ([place]) 11/17/1999   -Dr. Marin Olp  . Vitamin D deficiency 10/17/2015  . Wears glasses     Past Surgical History:  Procedure Laterality Date  . DILATION AND CURETTAGE OF UTERUS    . MULTIPLE TOOTH EXTRACTIONS    . NASAL POLYP EXCISION  1962  . TONSILLECTOMY  1952  . VIDEO BRONCHOSCOPY WITH ENDOBRONCHIAL NAVIGATION Right 06/29/2018   Procedure: VIDEO BRONCHOSCOPY WITH ENDOBRONCHIAL NAVIGATION;  Surgeon: Collene Gobble, MD;  Location: [facility];  Service: Thoracic;  Laterality: Right;  Marland Kitchen VIDEO BRONCHOSCOPY WITH ENDOBRONCHIAL ULTRASOUND Right 06/29/2018   Procedure: VIDEO BRONCHOSCOPY WITH ENDOBRONCHIAL ULTRASOUND;  Surgeon: Collene Gobble, MD;  Location: [facility];  Service: Thoracic;  Laterality: Right;     Prior to Admission medications   Medication Sig Start Date End Date Taking? Authorizing Provider  carvedilol (  COREG) 6.25 MG tablet Take 1 tablet (6.25 mg total) by mouth 2 (two) times daily. 02/21/18  Yes Mosie Lukes, MD  furosemide (LASIX) 20 MG tablet Take 1 tablet (20 mg total) by mouth daily as needed for fluid or edema (do not take if blood pressure is  low or any vomiting or diarrhea). 02/21/18  Yes Mosie Lukes, MD  hydrALAZINE (APRESOLINE) 10 MG tablet TAKE 1 TABLET (10 MG TOTAL) BY MOUTH 3 (THREE) TIMES DAILY. 06/02/18  Yes Lelon Perla, MD  isosorbide mononitrate (IMDUR) 30 MG 24 hr tablet Take 0.5 tablets (15 mg total) by mouth daily. 03/01/18  Yes Lelon Perla, MD  levothyroxine (SYNTHROID,  LEVOTHROID) 25 MCG tablet TAKE 1 TABLET BY MOUTH ONCE DAILY BEFORE BREAKFAST Patient taking differently: Take 25 mcg by mouth daily before breakfast.  11/11/18  Yes Mosie Lukes, MD  magnesium hydroxide (MILK OF MAGNESIA) 400 MG/5ML suspension Take 15 mLs by mouth daily as needed for mild constipation.   Yes [provider]  Multiple Vitamins-Minerals (CENTRUM PO) Take 1 tablet by mouth daily.    Yes [provider]  Omega-3 Fatty Acids (FISH OIL) 1000 MG CAPS Take 1,000 mg by mouth daily.   Yes [provider]  Probiotic Product (PROBIOTIC DAILY PO) Take 1 capsule by mouth daily.    Yes [provider]    Inpatient Medications: Scheduled Meds: . azithromycin  500 mg Oral Daily  . carvedilol  6.25 mg Oral BID  . enoxaparin (LOVENOX) injection  30 mg Subcutaneous Q24H  . furosemide  80 mg Intravenous BID  . guaiFENesin  600 mg Oral BID  . levothyroxine  25 mcg Oral Q0600  . multivitamin with minerals  1 tablet Oral Daily  . potassium chloride  40 mEq Oral Daily   Continuous Infusions: . sodium chloride 10 mL/hr at 12/01/18 1055  . cefTRIAXone (ROCEPHIN)  IV 1 g (12/01/18 1057)   PRN Meds: sodium chloride, acetaminophen **OR** acetaminophen, ipratropium-albuterol  Allergies:    Allergies  Allergen Reactions  . Lisinopril Swelling    Facial and lip swelling  . Iohexol Itching         Social History:   Social History   Socioeconomic History  . Marital status: Married    Spouse name: Jeneen Rinks  . Number of children: 5  . Years of education: Not on file  . Highest education level: Not on file  Occupational History  . Occupation: retired  Scientific laboratory technician  . Financial resource strain: Not on file  . Food insecurity:    Worry: Not on file    Inability: Not on file  . Transportation needs:    Medical: Not on file    Non-medical: Not on file  Tobacco Use  . Smoking status: Never Smoker  . Smokeless tobacco: Never Used  Substance and  Sexual Activity  . Alcohol use: Never    Alcohol/week: 0.0 standard drinks    Frequency: Never  . Drug use: Never  . Sexual activity: Yes    Comment: lives with husband, no dietary restrictions, minimizes dairy  Lifestyle  . Physical activity:    Days per week: Not on file    Minutes per session: Not on file  . Stress: Not on file  Relationships  . Social connections:    Talks on phone: Not on file    Gets together: Not on file    Attends religious service: Not on file    Active member of club  or organization: Not on file    Attends meetings of clubs or organizations: Not on file    Relationship status: Not on file  . Intimate partner violence:    Fear of current or ex partner: Not on file    Emotionally abused: Not on file    Physically abused: Not on file    Forced sexual activity: Not on file  Other Topics Concern  . Not on file  Social History Narrative   The patient is married ( husband Jannatul Wojdyla)  She just recently celebrated     her 52nd anniversary.  She has five children.  There is no active     tobacco or alcohol use history.          Smoking Status:  never   Caffeine use/day:  None   Does Patient Exercise:  yes    Family History:   Family History  Problem Relation Age of Onset  . Diabetes Mother        deceased secondary to diabetes  . Hypertension Mother   . Vision loss Mother   . Pneumonia Father        died age 28 due to complications of pneumonia  . Colon polyps Father   . Liver disease Daughter   . Sarcoidosis Daughter   . Diabetes Sister   . Colon cancer Brother 57  . Cancer Brother   . Obesity Son   . Diabetes Brother   . Kidney disease Brother   . Diabetes Sister   . Sarcoidosis Sister   . Arthritis Sister   . Arthritis Sister   . Diabetes Sister   . Arthritis Sister   . Diabetes Sister   . Hypertension Daughter   . Hypertension Daughter   . Other Other        no obvious premature cardiovascular disease or familial cardiomyopathy is  noted  . Alcohol abuse Neg Hx   . Heart disease Neg Hx    Family Status:  Family Status  Relation Name Status  . Mother 42 Deceased  . Father 23 Deceased  . Daughter 37,head trauma Deceased  . Sister 53 Deceased  . Brother 27,colon Alive  . Son 108 Alive  . MGM  Deceased  . MGF  Deceased  . PGM  Deceased  . PGF  Deceased  . Brother 58 Deceased  . Brother 5 Alive  . Sister 87 Alive  . Sister 8 Alive  . Sister 51 Alive  . Daughter 41 Alive  . Daughter 69 Alive  . Son 8 Alive  . Son 25 Alive  . Other  (Not Specified)  . Neg Hx  (Not Specified)    ROS:  Please see the history of present illness.  All other ROS reviewed and negative.     Physical Exam/Data:   Vitals:   11/30/18 2018 11/30/18 2108 12/01/18 0130 12/01/18 0502  BP: 108/72   124/89  Pulse: (!) 107 100 91 95  Resp: 15 (!) 27 (!) 25 (!) 27  Temp: 98.3 F (36.8 C)   97.9 F (36.6 C)  TempSrc: Oral   Oral  SpO2: 97% 100% 100% 100%  Weight:    61.4 kg  Height:        Intake/Output Summary (Last 24 hours) at 12/01/2018 1700 Last data filed at 12/01/2018 0800 Gross per 24 hour  Intake 180 ml  Output 900 ml  Net -720 ml   Filed Weights   11/29/18 1732 11/30/18 0129 12/01/18 0502  Weight: 62.1 kg 61.5 kg 61.4 kg   Body mass index is 23.97 kg/m.  General:  Well nourished, well developed, in no acute distress HEENT: normal Lymph: no adenopathy Neck: JVD approximately 9 cm Endocrine:  No thryomegaly Vascular: No carotid bruits; 4/4 extremity pulses 2+, without bruits  Cardiac:  normal S1, S2; RRR; systolic and diastolic murmurs noted Lungs: Decreased breath sounds bases with slight wheeze and a few rales bilaterally, no rhonchi Abd: soft, nontender, no hepatomegaly  Ext: Trace pedal edema Musculoskeletal:  No deformities, BUE and BLE strength normal and equal Skin: warm and dry  Neuro:  CNs 2-12 intact, no focal abnormalities noted Psych:  Normal affect   EKG:  The EKG was personally reviewed  and demonstrates: 11/29/2018 ECG is sinus tach, heart rate 102, frequent PVCs Telemetry:  Telemetry was personally reviewed and demonstrates: Sinus rhythm, PVCs  Relevant CV Studies:  ECHO: 11/30/2018 - Left ventricle: The cavity size was normal. Wall thickness was   normal. Systolic function was moderately reduced. The estimated   ejection fraction was in the range of 35% to 40%. Features are   consistent with a pseudonormal left ventricular filling pattern,   with concomitant abnormal relaxation and increased filling   pressure (grade 2 diastolic dysfunction). - Aortic valve: There was trivial regurgitation. - Left atrium: The atrium was severely dilated. - Right ventricle: The cavity size was moderately decreased.  ECHO: 06/11/2017 - Left ventricle: The cavity size was normal. Systolic function was   mildly to moderately reduced. The estimated ejection fraction was   in the range of 40% to 45%. Wall motion was normal; there were no   regional wall motion abnormalities. There was an increased   relative contribution of atrial contraction to ventricular   filling. Doppler parameters are consistent with abnormal left   ventricular relaxation (grade 1 diastolic dysfunction). - Aortic valve: Trileaflet. Moderate focal calcification involving   the noncoronary cusp. - Mitral valve: Mild diffuse thickening of the anterior leaflet.   There was mild regurgitation. - Tricuspid valve: There was trivial regurgitation. - Pulmonic valve: There was trivial regurgitation.  CATH: 2010 No CAD EF 15%, NICM  Laboratory Data:  Chemistry Recent Labs  Lab 11/29/18 1228 11/30/18 0419 12/01/18 0455  NA 132* 139 140  K 3.8 3.6 3.8  CL 103 105 107  CO2 22 24 24   GLUCOSE 210* 99 99  BUN 20 19 22   CREATININE 1.22* 1.41* 1.36*  CALCIUM 9.1 9.2 9.1  GFRNONAA 41* 35* 36*  GFRAA 48* 40* 42*  ANIONGAP 7 10 9     Lab Results  Component Value Date   ALT 51 (H) 11/29/2018   AST 39 11/29/2018     ALKPHOS 111 11/29/2018   BILITOT 0.7 11/29/2018   Hematology Recent Labs  Lab 11/29/18 1228 12/01/18 0455  WBC 6.1 4.5  RBC 4.01 3.61*  HGB 11.3* 10.3*  HCT 36.2 32.2*  MCV 90.3 89.2  MCH 28.2 28.5  MCHC 31.2 32.0  RDW 12.4 12.4  PLT 243 201   Cardiac Enzymes Recent Labs  Lab 11/29/18 1228  TROPONINI <0.03     BNP Recent Labs  Lab 11/29/18 1228  BNP 2,710.4*    DDimer  Recent Labs  Lab 11/29/18 1228  DDIMER 8.93*   TSH:  Lab Results  Component Value Date   TSH 4.755 (H) 11/29/2018   Lipids: Lab Results  Component Value Date   CHOL 172 08/29/2018   HDL 55.90 08/29/2018   LDLCALC 97 08/29/2018  TRIG 98.0 08/29/2018   CHOLHDL 3 08/29/2018   HgbA1c: Lab Results  Component Value Date   HGBA1C 5.5 08/29/2018   Magnesium:  Magnesium  Date Value Ref Range Status  01/19/2016 1.7 1.7 - 2.4 mg/dL Final     Radiology/Studies:  Nm Pulmonary Perf And Vent  Result Date: 11/30/2018 CLINICAL DATA:  Shortness of breath EXAM: NUCLEAR MEDICINE VENTILATION - PERFUSION LUNG SCAN VIEWS: Anterior, posterior, left lateral, right lateral, RPO, LPO, RAO, LAO-ventilation and perfusion RADIOPHARMACEUTICALS:  30.0 mCi of Tc-74m DTPA aerosol inhalation and 4.0 mCi Tc23m MAA IV COMPARISON:  Chest radiograph November 29, 2018 FINDINGS: Ventilation: There is photopenia in the posterior segment of the right upper lobe in the area of airspace consolidation seen on chest radiograph. Elsewhere, radiotracer uptake on the ventilation study is unremarkable. Perfusion: There is photopenia in the posterior segment of the right upper lobe which matches the ventilation defect and corresponds to infiltrate seen on chest radiograph. There is apparent fluid in each major fissure. No other segmental tight perfusion defect evident. IMPRESSION: Matching ventilation and perfusion defect in the posterior segment of the right upper lobe with corresponding airspace consolidation on chest radiograph. No  other appreciable perfusion defects evident. Fluid noted in each major fissure. Based on PIOPED II criteria, this study constitutes a low probability of pulmonary embolus. Electronically Signed   By: Lowella Grip III M.D.   On: 11/30/2018 12:30   Dg Chest Portable 1 View  Result Date: 11/29/2018 CLINICAL DATA:  Near syncope, hypoxia, shortness of breath and fatigue EXAM: PORTABLE CHEST 1 VIEW COMPARISON:  Portable chest x-ray of 06/29/2018 FINDINGS: The lungs are poorly aerated. There is abnormal opacity within the right upper lobe most consistent with pneumonia. Linear atelectasis or scarring is present at the left lung base and there may be small left pleural effusion present. Compared to the prior chest x-ray there does appear to be minimal opacity within the left upper lobe possibly due to scarring from the previous cavitary left apical lung lesion in addition, recurrence of tumor can not be excluded. Consider repeat CT of the chest. Cardiomegaly and mild pulmonary vascular congestion also can not be excluded. No acute bony abnormality is seen. IMPRESSION: 1. Right upper lobe opacity most consistent with pneumonia. 2. However there is cardiomegaly present with prominent interstitial markings and probable small effusion. Can not exclude superimposed CHF as well. 3. Opacity in the left upper lobe. Can not exclude recurrence of prior tumor. Consider CT of the chest with IV contrast media to assess further. Electronically Signed   By: Ivar Drape M.D.   On: 11/29/2018 12:51    Assessment and Plan:   1.  Acute on chronic combined CHF: - Feel this is contributing to her acute respiratory failure. - Urine output was 1.5 L yesterday. She got Lasix totalling 80 mg on 01/14, 20 mg IV yesterday, now on Lasix 20 mg twice daily -Discuss if we should increase that or just continue the same dose. - Feel that once her volume is optimized, she will need a low daily dose of Lasix to maintain it -She has been on  a low dose of carvedilol, Imdur and hydralazine - hydralazine and Imdur are on hold right now, she is still on the carvedilol -Follow daily weights and intake/output, although intake/output is incomplete -Renal function has been up a little with diuresis, follow  Otherwise, per IM Principal Problem:   Acute respiratory failure with hypoxia ([place]) Active Problems:   Hypothyroidism  Essential hypertension   Diffuse large B-cell lymphoma of lymph nodes of neck (HCC)   Acute on chronic systolic CHF (congestive heart failure), NYHA class 4 (HCC)   CAP (community acquired pneumonia)   Sepsis ([place])   UTI (urinary tract infection)   Near syncope   CKD (chronic kidney disease) stage 3, GFR 30-59 ml/min ([place])   For questions or updates, please contact [facility] Please consult [email or web address] for contact info under Cardiology/STEMI.   Signed, Candee Furbish, MD  12/01/2018 5:00 PM  Personally seen and examined. Agree with above.   85 year old with nonischemic cardiomyopathy current ejection fraction 35% with prior lung mass, refusal of ICD, non-Hodgkin's lymphoma here with shortness of breath, concerning for increased pulmonary edema.  Currently laying in bed, mild shortness of breath with activity.  No chest pain.  GEN: Elderly looks younger than stated age, well nourished, well developed, in no acute distress  HEENT: normal  Neck: no JVD, carotid bruits, or masses Cardiac: RRR; no murmurs, rubs, or gallops, trace lower extremity edema  Respiratory:  clear to auscultation bilaterally, normal work of breathing GI: soft, nontender, nondistended, + BS MS: no deformity or atrophy  Skin: warm and dry, no rash Neuro:  Alert and Oriented x 3, Strength and sensation are intact Psych: euthymic mood, full affect  Negative PE on VQ scan  Echocardiogram-35% EF  Assessment and plan:  Acute on chronic combined systolic heart failure - Fairly reasonable urine output however this has  dwindled since decreasing to Lasix 20 mg a day.  We will go ahead and increase her back to 80 mg IV twice daily. - Monitor creatinine closely.  This is slightly decreased. - Agree with continuing carvedilol but stopping Imdur and hydralazine to allow for higher blood pressure and continue diuresis.  Possible pneumonia - Pneumonia azithromycin.  Near syncope - Near syncopal episode going up stairs at her PCP office.  Deconditioning.  CKD 3 - Creatinine approximately 1.4.  Left lung mass -Last note by Dr. Marin Olp in August states that he was worried about malignancy.  Candee Furbish, MD

## 2018-12-01 NOTE — Evaluation (Signed)
Clinical/Bedside Swallow Evaluation Patient Details  Name: Crystal [name] MRN: [id number] Date of Birth: 1934-11-02  Today's Date: 12/01/2018 Time: SLP Start Time (ACUTE ONLY): 1325 SLP Stop Time (ACUTE ONLY): 1345 SLP Time Calculation (min) (ACUTE ONLY): 20 min  Past Medical History:  Past Medical History:  Diagnosis Date  . Anemia 08/21/2017  . Anxiety   . Cardiomyopathy    non ischemic NL cos on cath 08/2009, EF to 15-20%, Her follow up  2D echo  in 10/2009 showed impoved EFo 35-40%  . CHF (congestive heart failure) ([place])   . Depression   . Full dentures   . Hypercalcemia 04/20/2017  . Hypertension   . Hypothyroid   . Lung mass   . Medicare annual wellness visit, subsequent 12/14/2014   Follows with Dr Marin Olp Follows with Dr Deatra Ina, gastroenterology, last colonoscopy in 2012, repeat in 2017 Last Pap 2011, always normal, no need for repeat Last MGM in 2011, no concerns, declines for now Follows with Dr Stanford Breed of cardiology   . Non Hodgkin's lymphoma ([place]) 11/17/1999   -Dr. Marin Olp  . Vitamin D deficiency 10/17/2015  . Wears glasses    Past Surgical History:  Past Surgical History:  Procedure Laterality Date  . DILATION AND CURETTAGE OF UTERUS    . MULTIPLE TOOTH EXTRACTIONS    . NASAL POLYP EXCISION  1962  . TONSILLECTOMY  1952  . VIDEO BRONCHOSCOPY WITH ENDOBRONCHIAL NAVIGATION Right 06/29/2018   Procedure: VIDEO BRONCHOSCOPY WITH ENDOBRONCHIAL NAVIGATION;  Surgeon: Collene Gobble, MD;  Location: [place];  Service: Thoracic;  Laterality: Right;  Marland Kitchen VIDEO BRONCHOSCOPY WITH ENDOBRONCHIAL ULTRASOUND Right 06/29/2018   Procedure: VIDEO BRONCHOSCOPY WITH ENDOBRONCHIAL ULTRASOUND;  Surgeon: Collene Gobble, MD;  Location: [facility];  Service: Thoracic;  Laterality: Right;   HPI:  85 year old female admitted 11/29/2018 with hypoxia. PMH:   Assessment / Plan / Recommendation Clinical Impression  Pt eating lunch upon arrival of SLP. Pt was awake and alert, pleasant and cooperative. She  reports no history of either swallowing difficulty or pneumonia. Oral motor strength and function appear adequate, with no obvious oral deficits. No overt s/s aspiration observed during lunch. No further ST intervention is recommended at this time, however, if pneumonia becomes recurrent or lungs do not clear in a timely fashion, consideration of instrumental assessment is recommended. ST signing off.     SLP Visit Diagnosis: Dysphagia, unspecified (R13.10)    Aspiration Risk  Mild aspiration risk    Diet Recommendation Regular;Thin liquid   Liquid Administration via: Cup;Straw Medication Administration: Whole meds with liquid Supervision: Patient able to self feed Compensations: Slow rate;Small sips/bites Postural Changes: Seated upright at 90 degrees    Other  Recommendations Oral Care Recommendations: Oral care BID   Follow up Recommendations None       Prognosis Prognosis for Safe Diet Advancement: Good      Swallow Study   General Date of Onset: 11/29/18 HPI: 85 year old female admitted 11/29/2018 with hypoxia. PMH: Type of Study: Bedside Swallow Evaluation Previous Swallow Assessment: none Diet Prior to this Study: Regular;Thin liquids Temperature Spikes Noted: No Respiratory Status: Nasal cannula History of Recent Intubation: No Behavior/Cognition: Alert;Cooperative Oral Cavity Assessment: Within Functional Limits Oral Care Completed by SLP: No Oral Cavity - Dentition: Dentures, top;Dentures, bottom Vision: Functional for self-feeding Self-Feeding Abilities: Able to feed self Patient Positioning: Upright in bed Baseline Vocal Quality: Normal Volitional Cough: Strong Volitional Swallow: Able to elicit    Oral/Motor/Sensory Function Overall Oral Motor/Sensory Function:  Within functional limits   Ice Chips Ice chips: Not tested   Thin Liquid Thin Liquid: Within functional limits Presentation: Straw    Nectar Thick Nectar Thick Liquid: Not tested   Honey Thick  Honey Thick Liquid: Not tested   Puree Puree: Within functional limits Presentation: Self Fed;Spoon   Solid     Solid: Within functional limits Presentation: [place] [facility], [facility], [place] Speech Language Pathologist [phone]  Shonna Chock 12/01/2018,1:48 PM

## 2018-12-01 NOTE — Progress Notes (Signed)
RN notified of 6 beats SVT.  Strip saved to chart.

## 2018-12-01 NOTE — Progress Notes (Signed)
PHARMACIST - PHYSICIAN COMMUNICATION ° °CONCERNING: Antibiotic IV to Oral Route Change Policy ° °RECOMMENDATION: °This patient is receiving azithromycin by the intravenous route.  Based on criteria approved by the Pharmacy and Therapeutics Committee, the antibiotic(s) is/are being converted to the equivalent oral dose form(s). ° ° °DESCRIPTION: °These criteria include: °Patient being treated for a respiratory tract infection, urinary tract infection, cellulitis or clostridium difficile associated diarrhea if on metronidazole °The patient is not neutropenic and does not exhibit a GI malabsorption state °The patient is eating (either orally or via tube) and/or has been taking other orally administered medications for a least 24 hours °The patient is improving clinically and has a Tmax < 100.5 ° °If you have questions about this conversion, please contact the Pharmacy Department  °[]  ( [phone] )  [place] °[]  ( [phone] )  [facility] °[x]  ( [phone] )  [place] °[]  ( [phone] )  [facility] °[]  ( [phone] )  [facility]   ° ° °Akila Batta, PharmD °Clinical Pharmacist °**Pharmacist phone directory can now be found on [email or web address] ([email or web address]).  Listed under [facility]. ° ° °

## 2018-12-01 NOTE — Progress Notes (Signed)
Subjective:    Patient ID: Crystal [name], female    DOB: 10-Dec-1933, 85 y.o.   MRN: [id number]  HPI   I was called emergently to the exam room by CMA.  Pt was slumped in chair, barely conscious but arousable.  Oxygen was 88% on RA. She was placed on 2 L oxygen via nasal cannula and oxygen came up to 93%.  She was unable to provide much history. Husband was in waiting room.    Review of Systems     Past Medical History:  Diagnosis Date  . Anemia 08/21/2017  . Anxiety   . Cardiomyopathy    non ischemic NL cos on cath 08/2009, EF to 15-20%, Her follow up  2D echo  in 10/2009 showed impoved EFo 35-40%  . CHF (congestive heart failure) ([place])   . Depression   . Full dentures   . Hypercalcemia 04/20/2017  . Hypertension   . Hypothyroid   . Lung mass   . Medicare annual wellness visit, subsequent 12/14/2014   Follows with Dr Marin Olp Follows with Dr Deatra Ina, gastroenterology, last colonoscopy in 2012, repeat in 2017 Last Pap 2011, always normal, no need for repeat Last MGM in 2011, no concerns, declines for now Follows with Dr Stanford Breed of cardiology   . Non Hodgkin's lymphoma ([place]) 11/17/1999   -Dr. Marin Olp  . Vitamin D deficiency 10/17/2015  . Wears glasses      Social History   Socioeconomic History  . Marital status: Married    Spouse name: Jeneen Rinks  . Number of children: 5  . Years of education: Not on file  . Highest education level: Not on file  Occupational History  . Occupation: retired  Scientific laboratory technician  . Financial resource strain: Not on file  . Food insecurity:    Worry: Not on file    Inability: Not on file  . Transportation needs:    Medical: Not on file    Non-medical: Not on file  Tobacco Use  . Smoking status: Never Smoker  . Smokeless tobacco: Never Used  Substance and Sexual Activity  . Alcohol use: Never    Alcohol/week: 0.0 standard drinks    Frequency: Never  . Drug use: Never  . Sexual activity: Yes    Comment: lives with husband, no dietary  restrictions, minimizes dairy  Lifestyle  . Physical activity:    Days per week: Not on file    Minutes per session: Not on file  . Stress: Not on file  Relationships  . Social connections:    Talks on phone: Not on file    Gets together: Not on file    Attends religious service: Not on file    Active member of club or organization: Not on file    Attends meetings of clubs or organizations: Not on file    Relationship status: Not on file  . Intimate partner violence:    Fear of current or ex partner: Not on file    Emotionally abused: Not on file    Physically abused: Not on file    Forced sexual activity: Not on file  Other Topics Concern  . Not on file  Social History Narrative   The patient is married ( husband Hilarie Sinha)  She just recently celebrated     her 52nd anniversary.  She has five children.  There is no active     tobacco or alcohol use history.          Smoking Status:  never   Caffeine use/day:  None   Does Patient Exercise:  yes    Past Surgical History:  Procedure Laterality Date  . DILATION AND CURETTAGE OF UTERUS    . MULTIPLE TOOTH EXTRACTIONS    . NASAL POLYP EXCISION  1962  . TONSILLECTOMY  1952  . VIDEO BRONCHOSCOPY WITH ENDOBRONCHIAL NAVIGATION Right 06/29/2018   Procedure: VIDEO BRONCHOSCOPY WITH ENDOBRONCHIAL NAVIGATION;  Surgeon: Collene Gobble, MD;  Location: [facility];  Service: Thoracic;  Laterality: Right;  Marland Kitchen VIDEO BRONCHOSCOPY WITH ENDOBRONCHIAL ULTRASOUND Right 06/29/2018   Procedure: VIDEO BRONCHOSCOPY WITH ENDOBRONCHIAL ULTRASOUND;  Surgeon: Collene Gobble, MD;  Location: [facility];  Service: Thoracic;  Laterality: Right;    Family History  Problem Relation Age of Onset  . Diabetes Mother        deceased secondary to diabetes  . Hypertension Mother   . Vision loss Mother   . Pneumonia Father        died age 81 due to complications of pneumonia  . Colon polyps Father   . Liver disease Daughter   . Sarcoidosis Daughter   . Diabetes  Sister   . Colon cancer Brother 79  . Cancer Brother   . Obesity Son   . Diabetes Brother   . Kidney disease Brother   . Diabetes Sister   . Sarcoidosis Sister   . Arthritis Sister   . Arthritis Sister   . Diabetes Sister   . Arthritis Sister   . Diabetes Sister   . Hypertension Daughter   . Hypertension Daughter   . Other Other        no obvious premature cardiovascular disease or familial cardiomyopathy is noted  . Alcohol abuse Neg Hx   . Heart disease Neg Hx     Allergies  Allergen Reactions  . Lisinopril Swelling    Facial and lip swelling  . Iohexol Itching         No current facility-administered medications on file prior to visit.    Current Outpatient Medications on File Prior to Visit  Medication Sig Dispense Refill  . carvedilol (COREG) 6.25 MG tablet Take 1 tablet (6.25 mg total) by mouth 2 (two) times daily. 180 tablet 3  . furosemide (LASIX) 20 MG tablet Take 1 tablet (20 mg total) by mouth daily as needed for fluid or edema (do not take if blood pressure is  low or any vomiting or diarrhea). 90 tablet 1  . hydrALAZINE (APRESOLINE) 10 MG tablet TAKE 1 TABLET (10 MG TOTAL) BY MOUTH 3 (THREE) TIMES DAILY. 270 tablet 3  . isosorbide mononitrate (IMDUR) 30 MG 24 hr tablet Take 0.5 tablets (15 mg total) by mouth daily. 45 tablet 3  . levothyroxine (SYNTHROID, LEVOTHROID) 25 MCG tablet TAKE 1 TABLET BY MOUTH ONCE DAILY BEFORE BREAKFAST (Patient taking differently: Take 25 mcg by mouth daily before breakfast. ) 30 tablet 0  . Multiple Vitamins-Minerals (CENTRUM PO) Take 1 tablet by mouth daily.     . Omega-3 Fatty Acids (FISH OIL) 1000 MG CAPS Take 1,000 mg by mouth daily.    . Probiotic Product (PROBIOTIC DAILY PO) Take 1 capsule by mouth daily.       BP (!) 125/95 (BP Location: Left Arm, Patient Position: Sitting, Cuff Size: Small)   Pulse 94   Temp 98.4 F (36.9 C) (Oral)   Resp 20   Ht 5\' 3"  (1.6 m)   Wt 140 lb (63.5 kg)   SpO2 93%   BMI 24.80 kg/m  Objective:   Physical Exam Constitutional:      General: She is not in acute distress.    Appearance: She is toxic-appearing. She is not ill-appearing.  HENT:     Head: Normocephalic and atraumatic.  Cardiovascular:     Rate and Rhythm: Normal rate and regular rhythm.  Skin:    General: Skin is warm and dry.           Assessment & Plan:  Near syncope/hypoxia-  Patient was brought immediately down to the ED in a wheel chair for further evaluation.  I contacted the EDP on duty and report was given.

## 2018-12-02 DIAGNOSIS — I5023 Acute on chronic systolic (congestive) heart failure: Secondary | ICD-10-CM

## 2018-12-02 DIAGNOSIS — J9601 Acute respiratory failure with hypoxia: Secondary | ICD-10-CM

## 2018-12-02 LAB — BASIC METABOLIC PANEL
Anion gap: 7 (ref 5–15)
BUN: 27 mg/dL — ABNORMAL HIGH (ref 8–23)
CHLORIDE: 105 mmol/L (ref 98–111)
CO2: 27 mmol/L (ref 22–32)
Calcium: 9.5 mg/dL (ref 8.9–10.3)
Creatinine, Ser: 1.51 mg/dL — ABNORMAL HIGH (ref 0.44–1.00)
GFR calc Af Amer: 37 mL/min — ABNORMAL LOW (ref >60)
GFR, EST NON AFRICAN AMERICAN: 32 mL/min — AB (ref >60)
Glucose, Bld: 170 mg/dL — ABNORMAL HIGH (ref 70–99)
POTASSIUM: 4.4 mmol/L (ref 3.5–5.1)
Sodium: 139 mmol/L (ref 135–145)

## 2018-12-02 LAB — CBC
HCT: 35.9 % — ABNORMAL LOW (ref 36.0–46.0)
Hemoglobin: 11.2 g/dL — ABNORMAL LOW (ref 12.0–15.0)
MCH: 27.4 pg (ref 26.0–34.0)
MCHC: 31.2 g/dL (ref 30.0–36.0)
MCV: 87.8 fL (ref 80.0–100.0)
PLATELETS: 223 10*3/uL (ref 150–400)
RBC: 4.09 MIL/uL (ref 3.87–5.11)
RDW: 12.2 % (ref 11.5–15.5)
WBC: 4.2 10*3/uL (ref 4.0–10.5)
nRBC: 0 % (ref 0.0–0.2)

## 2018-12-02 MED ORDER — GUAIFENESIN ER 600 MG PO TB12: 600.0000 mg | ORAL_TABLET | Freq: Two times a day (BID) | ORAL | Status: DC | PRN

## 2018-12-02 MED ORDER — MAGNESIUM HYDROXIDE 400 MG/5ML PO SUSP: 15.0000 mL | Freq: Every day | ORAL | Status: DC | PRN

## 2018-12-02 MED ORDER — FUROSEMIDE 20 MG PO TABS
20.0000 mg | ORAL_TABLET | Freq: Every day | ORAL | Status: DC
  Administered 2018-12-03: 20 mg via ORAL
  Filled 2018-12-02: qty 1

## 2018-12-02 MED ORDER — ACETAMINOPHEN 325 MG PO TABS: 650.0000 mg | ORAL_TABLET | Freq: Four times a day (QID) | ORAL | Status: DC | PRN

## 2018-12-02 NOTE — Progress Notes (Signed)
Patient had a couple of episodes of 7 SVT and HR jumped to 159.  Asymptomatic.  Strip saved.  I will keep monitoring patient.

## 2018-12-02 NOTE — Progress Notes (Addendum)
Progress Note  Patient Name: Crystal [name] Date of Encounter: 12/02/2018  Primary Cardiologist: [name], MD   Subjective   Sitting on the side of the bed and just finished bathing herself. She reports she is feeling much better. Breathing as improved. Less coughing fits. She denies CP.   Inpatient Medications    Scheduled Meds: . azithromycin  500 mg Oral Daily  . carvedilol  6.25 mg Oral BID  . enoxaparin (LOVENOX) injection  30 mg Subcutaneous Q24H  . furosemide  80 mg Intravenous BID  . guaiFENesin  600 mg Oral BID  . levothyroxine  25 mcg Oral Q0600  . multivitamin with minerals  1 tablet Oral Daily  . potassium chloride  40 mEq Oral Daily   Continuous Infusions: . sodium chloride 10 mL/hr at 12/01/18 1055  . cefTRIAXone (ROCEPHIN)  IV 1 g (12/01/18 1057)   PRN Meds: sodium chloride, acetaminophen **OR** acetaminophen, ipratropium-albuterol, LORazepam   Vital Signs    Vitals:   12/01/18 0130 12/01/18 0502 12/01/18 2037 12/02/18 0629  BP:  124/89 120/82 123/74  Pulse: 91 95 97 93  Resp: (!) 25 (!) 27    Temp:  97.9 F (36.6 C) 98.6 F (37 C) (!) 97.5 F (36.4 C)  TempSrc:  Oral Oral Oral  SpO2: 100% 100% 97% 99%  Weight:  61.4 kg    Height:        Intake/Output Summary (Last 24 hours) at 12/02/2018 0707 Last data filed at 12/01/2018 2240 Gross per 24 hour  Intake 480 ml  Output 900 ml  Net -420 ml   Last 3 Weights 12/01/2018 11/30/2018 11/29/2018  Weight (lbs) 135 lb 4.8 oz 135 lb 9.6 oz 136 lb 14.4 oz  Weight (kg) 61.372 kg 61.508 kg 62.097 kg      Telemetry    NSR, 4 beat run of NSVT - Personally Reviewed  ECG    Not performed today- Personally Reviewed  Physical Exam   GEN: 85 y/o AAF, looks younger than actual age, in no acute distress.   Neck: No JVD Cardiac: RRR, no murmurs, rubs, or gallops.  Respiratory: Clear to auscultation bilaterally. GI: Soft, nontender, non-distended  MS: No edema; No deformity. Neuro:  Nonfocal    Psych: Normal affect   Labs    Chemistry Recent Labs  Lab 11/29/18 1228 11/30/18 0419 12/01/18 0455 12/02/18 0338  NA 132* 139 140 139  K 3.8 3.6 3.8 4.4  CL 103 105 107 105  CO2 22 24 24 27   GLUCOSE 210* 99 99 170*  BUN 20 19 22  27*  CREATININE 1.22* 1.41* 1.36* 1.51*  CALCIUM 9.1 9.2 9.1 9.5  PROT 8.4*  --   --   --   ALBUMIN 2.9*  --   --   --   AST 39  --   --   --   ALT 51*  --   --   --   ALKPHOS 111  --   --   --   BILITOT 0.7  --   --   --   GFRNONAA 41* 35* 36* 32*  GFRAA 48* 40* 42* 37*  ANIONGAP 7 10 9 7      Hematology Recent Labs  Lab 11/29/18 1228 12/01/18 0455 12/02/18 0338  WBC 6.1 4.5 4.2  RBC 4.01 3.61* 4.09  HGB 11.3* 10.3* 11.2*  HCT 36.2 32.2* 35.9*  MCV 90.3 89.2 87.8  MCH 28.2 28.5 27.4  MCHC 31.2 32.0 31.2  RDW 12.4 12.4  12.2  PLT 243 201 223    Cardiac Enzymes Recent Labs  Lab 11/29/18 1228  TROPONINI <0.03   No results for input(s): TROPIPOC in the last 168 hours.   BNP Recent Labs  Lab 11/29/18 1228  BNP 2,710.4*     DDimer  Recent Labs  Lab 11/29/18 1228  DDIMER 8.93*     Radiology    Nm Pulmonary Perf And Vent  Result Date: 11/30/2018 CLINICAL DATA:  Shortness of breath EXAM: NUCLEAR MEDICINE VENTILATION - PERFUSION LUNG SCAN VIEWS: Anterior, posterior, left lateral, right lateral, RPO, LPO, RAO, LAO-ventilation and perfusion RADIOPHARMACEUTICALS:  30.0 mCi of Tc-61m DTPA aerosol inhalation and 4.0 mCi Tc44m MAA IV COMPARISON:  Chest radiograph November 29, 2018 FINDINGS: Ventilation: There is photopenia in the posterior segment of the right upper lobe in the area of airspace consolidation seen on chest radiograph. Elsewhere, radiotracer uptake on the ventilation study is unremarkable. Perfusion: There is photopenia in the posterior segment of the right upper lobe which matches the ventilation defect and corresponds to infiltrate seen on chest radiograph. There is apparent fluid in each major fissure. No other  segmental tight perfusion defect evident. IMPRESSION: Matching ventilation and perfusion defect in the posterior segment of the right upper lobe with corresponding airspace consolidation on chest radiograph. No other appreciable perfusion defects evident. Fluid noted in each major fissure. Based on PIOPED II criteria, this study constitutes a low probability of pulmonary embolus. Electronically Signed   By: Lowella Grip III M.D.   On: 11/30/2018 12:30    Cardiac Studies   2D echocardiogram November 30, 2018 Study Conclusions  - Left ventricle: The cavity size was normal. Wall thickness was   normal. Systolic function was moderately reduced. The estimated   ejection fraction was in the range of 35% to 40%. Features are   consistent with a pseudonormal left ventricular filling pattern,   with concomitant abnormal relaxation and increased filling   pressure (grade 2 diastolic dysfunction). - Aortic valve: There was trivial regurgitation. - Left atrium: The atrium was severely dilated. - Right ventricle: The cavity size was moderately decreased.  Patient Profile     85 year old with nonischemic cardiomyopathy current ejection fraction 35% with prior lung mass, refusal of ICD, non-Hodgkin's lymphoma here with shortness of breath secondary to acute on chronic systolic CHF and possible PNA.   Assessment & Plan    1.  Acute on chronic combined systolic and diastolic CHF: pt reports improvement in symptoms. Breathing improved. She appears euvolemic on exam. No LEE. Lungs are CTAB. She did have a bump in SCr from 1.2>>1.5 over the course of 24 hrs. Hold IV Lasix dose this morning. Transition back to PO in the next 24 hrs. Monitor renal function. Recommend low sodium diet.   2.  Possible pneumonia: Antibiotics per internal medicine. Pt reports cough and dyspnea have both improved.   3.  Chronic kidney disease stage III: Serum creatinine has increased in the past 24 hours up from 1.36-1.51  today.  BUN also up from 22-27.  Her baseline creatinine over the past 2 years has ranged between 1.2-1.5. Will hold IV Lasix this morning. Follow renal function. Possible transition back to PO Lasix later this afternoon or tomorrow morning.   4.  Lung mass:  Last note by Dr. Marin Olp in August states that he was worried about malignancy.   For questions or updates, please contact [place] Please consult [email or web address] for contact info under  Signed, Lyda Jester, PA-C  12/02/2018, 7:07 AM   Personally seen and examined. Agree with above.   85 year old with acute on chronic combined systolic and diastolic heart failure, EF 35 to 40% -Agree with holding IV Lasix given her mild serum creatinine increased to 1.5 and increase in BUN as well.  She appears euvolemic currently.  Breathing is much improved.  I would suggest resuming p.o. Lasix later this afternoon or tomorrow.  Instead of taking Lasix 20 mg as needed, I would suggest that she take 20 mg of Lasix daily.  Lab work personally reviewed as above.  GEN: Well nourished, well developed, in no acute distress  HEENT: normal  Neck: no JVD, carotid bruits, or masses Cardiac: RRR; no murmurs, rubs, or gallops,no edema  Respiratory:  clear to auscultation bilaterally, normal work of breathing GI: soft, nontender, nondistended, + BS MS: no deformity or atrophy  Skin: warm and dry, no rash Neuro:  Alert and Oriented x 3, Strength and sensation are intact Psych: euthymic mood, full affect  Possible pneumonia -Antibiotics, cough and dyspnea have both improved.  Possibly pulmonary edema related.  Chronic kidney disease stage III - Creatinine currently 1.5 up from 1.3.  Baseline range 1.2-1.5.  Avoid NSAIDs.  Lung mass - Dr. Marin Olp aware, worried about malignancy  [facility] will sign off.   Medication Recommendations: 20 mg of Lasix p.o. daily instead of PRN Other recommendations (labs, testing, etc): Continue to  monitor renal function as outpatient Follow up as an outpatient: Follow-up in 2 to 4 weeks with cardiology we will set up appointment.   Candee Furbish, MD

## 2018-12-02 NOTE — Care Management Important Message (Signed)
Important Message  Patient Details  Name: Crystal [name] MRN: [id number] Date of Birth: 1934/01/07   Medicare Important Message Given:  Yes    Jerome Otter P Sayre 12/02/2018, 5:01 PM

## 2018-12-02 NOTE — Progress Notes (Signed)
Crystal  DALLY [name]  DTO:[id number] DOB: 05/18/1934 DOA: 11/29/2018 PCP: Mosie Lukes, MD    Brief Narrative:  85 y.o.femalewith a hx ofanemia, chronic combined systolic and diastolic congestive heart failure, HTN, hypothyroidism, and non-Hodgkin's lymphoma who presented w/ shortness of breath, nonproductive cough, and bilateral pedal edema of a weeks duration.   Subjective: Resting comfortably in bed.  Anxious to get home.  Denies chest pain nausea vomiting abdominal pain.  Assessment & Plan:  Acute hypoxic respiratory failure Not on O2 at home - VQ scan negative for PE -likely multifactorial to include possible pneumonia, pulmonary edema, lung mass  Acute exacerbation of chronic combined systolic and diastolic congestive heart failure TTE July 2018 showing EF 40 to 45% and grade 1 DD - TTE this admit w/ EF down to 35-40% and grade 2 DD - baseline wgt 61.6kg -transitioning to scheduled Lasix dosing -assure renal function tolerates  Filed Weights   11/29/18 1732 11/30/18 0129 12/01/18 0502  Weight: 62.1 kg 61.5 kg 61.4 kg    SIRS v/s Sepsis due to ?RUL Community-acquired pneumonia Afebrile th/o admit, w/ normal WBC -complete 5 days of antibiotic therapy then discontinue yeast  Abnormal UA Urine culture unrevealing - no other clinical signs to suggests active infxn - doubt she has a true UA - abx course underway - will stop abx after 5 days of tx   CKD 3 Baseline crt 1.2-1.5 -recheck in a.m. to assure stable with scheduled diuretic dosing  Recent Labs  Lab 11/29/18 1228 11/30/18 0419 12/01/18 0455 12/02/18 0338  CREATININE 1.22* 1.41* 1.36* 1.51*    Hypothyroidism TSH elevated but free T4 normal -no change in treatment plan at this time  Left apical lung mass hypermetabolic on PET scan w/ hypermetabolic mediastinal and hilar lymphadenopathy also noted - underwent bronchoscopy w/ biopsies August 2019 w/ path unrevealing - it does not  appear that she has seen Dr. Marin Olp since her bx - last telephone note July 06, 2018 notes continued concern for malignancy and a plan to speak to [facility] -continue work-up as outpatient  History of non-Hodgkin's lymphoma Followed by Dr. Marin Olp   Hypertension BP controlled at this time   DVT prophylaxis: lovenox  Code Status: FULL CODE Family Communication: no family present at time of exam  Disposition Plan:   Consultants:  Cardiology   Antimicrobials:  Azithromycin Rocephin   Objective: Blood pressure 123/74, pulse 93, temperature (!) 97.5 F (36.4 C), temperature source Oral, resp. rate (!) 27, height 5\' 3"  (1.6 m), weight 61.4 kg, SpO2 99 %.  Intake/Output Summary (Last 24 hours) at 12/02/2018 1155 Last data filed at 12/01/2018 2240 Gross per 24 hour  Intake 300 ml  Output 900 ml  Net -600 ml   Filed Weights   11/29/18 1732 11/30/18 0129 12/01/18 0502  Weight: 62.1 kg 61.5 kg 61.4 kg    Examination: General: No acute respiratory distress at rest in bed Lungs: Mild bibasilar crackles with no wheezing Cardiovascular: RRR without murmur or rub Abdomen: Nontender, nondistended, soft, bowel sounds positive Extremities: No significant edema bilateral lower extremities  CBC: Recent Labs  Lab 11/29/18 1228 12/01/18 0455 12/02/18 0338  WBC 6.1 4.5 4.2  NEUTROABS 3.9  --   --   HGB 11.3* 10.3* 11.2*  HCT 36.2 32.2* 35.9*  MCV 90.3 89.2 87.8  PLT 243 201 983   Basic Metabolic Panel: Recent Labs  Lab 11/30/18 0419 12/01/18 0455 12/02/18 0338  NA 139 140 139  K 3.6 3.8 4.4  CL 105 107 105  CO2 24 24 27   GLUCOSE 99 99 170*  BUN 19 22 27*  CREATININE 1.41* 1.36* 1.51*  CALCIUM 9.2 9.1 9.5   GFR: Estimated Creatinine Clearance: 23.8 mL/min (A) (by C-G formula based on SCr of 1.51 mg/dL (H)).  Liver Function Tests: Recent Labs  Lab 11/29/18 1228  AST 39  ALT 51*  ALKPHOS 111  BILITOT 0.7  PROT 8.4*  ALBUMIN 2.9*   Recent Labs  Lab  11/29/18 1228  LIPASE 26    Cardiac Enzymes: Recent Labs  Lab 11/29/18 1228  TROPONINI <0.03    HbA1C: Hgb A1c MFr Bld  Date/Time Value Ref Range Status  08/29/2018 11:10 AM 5.5 4.6 - 6.5 % Final    Comment:    Glycemic Control Guidelines for People with Diabetes:Non Diabetic:  <6%Goal of Therapy: <7%Additional Action Suggested:  >8%   02/21/2018 10:57 AM 5.7 4.6 - 6.5 % Final    Comment:    Glycemic Control Guidelines for People with Diabetes:Non Diabetic:  <6%Goal of Therapy: <7%Additional Action Suggested:  >8%     CBG: Recent Labs  Lab 11/29/18 1243  GLUCAP 213*    Recent Results (from the past 240 hour(s))  Blood culture (routine x 2)     Status: None (Preliminary result)   Collection Time: 11/29/18  1:32 PM  Result Value Ref Range Status   Specimen Description   Final    BLOOD RIGHT ANTECUBITAL Performed at [facility], [address] [place], [place] [address]    Special Requests   Final    BOTTLES DRAWN AEROBIC AND ANAEROBIC Blood Culture adequate volume Performed at [facility], [address] [place], [place] [address]    Culture   Final    NO GROWTH 3 DAYS Performed at [facility], [place] [address], [place], [place] [address]    Report Status PENDING  Incomplete  Blood culture (routine x 2)     Status: None (Preliminary result)   Collection Time: 11/29/18  1:40 PM  Result Value Ref Range Status   Specimen Description   Final    BLOOD LEFT ANTECUBITAL Performed at [facility], [address] [place], [place] [address]    Special Requests   Final    BOTTLES DRAWN AEROBIC AND ANAEROBIC Blood Culture adequate volume Performed at [facility], [address] [place], [place] [address]    Culture   Final    NO GROWTH 3 DAYS Performed at [facility], [place] [address] [place], [place] [address]    Report Status PENDING  Incomplete  Urine culture     Status: None   Collection Time: 11/29/18   2:10 PM  Result Value Ref Range Status   Specimen Description   Final    URINE, CATHETERIZED Performed at [facility], [address] [place], [place] [address]    Special Requests   Final    NONE Performed at [facility], [address] [place], [place] [address]    Culture   Final    Multiple bacterial morphotypes present, none predominant. Suggest appropriate recollection if clinically indicated.   Report Status 11/30/2018 FINAL  Final     Scheduled Meds: . azithromycin  500 mg Oral Daily  . carvedilol  6.25 mg Oral BID  . enoxaparin (LOVENOX) injection  30 mg Subcutaneous Q24H  .  guaiFENesin  600 mg Oral BID  . levothyroxine  25 mcg Oral Q0600  . multivitamin with minerals  1 tablet Oral Daily  . potassium chloride  40 mEq Oral Daily   Continuous Infusions: . sodium chloride 10 mL/hr at 12/01/18 1055  . cefTRIAXone (ROCEPHIN)  IV 1 g (12/02/18 0911)     LOS: 3 days   Cherene Altes, MD [facility] Office  [phone] Pager - Text Page per Amion  If 7PM-7AM, please contact night-coverage per Amion 12/02/2018, 11:55 AM

## 2018-12-03 LAB — BASIC METABOLIC PANEL
Anion gap: 10 (ref 5–15)
BUN: 32 mg/dL — ABNORMAL HIGH (ref 8–23)
CO2: 24 mmol/L (ref 22–32)
Calcium: 9.5 mg/dL (ref 8.9–10.3)
Chloride: 106 mmol/L (ref 98–111)
Creatinine, Ser: 1.36 mg/dL — ABNORMAL HIGH (ref 0.44–1.00)
GFR calc Af Amer: 42 mL/min — ABNORMAL LOW (ref >60)
GFR calc non Af Amer: 36 mL/min — ABNORMAL LOW (ref >60)
Glucose, Bld: 110 mg/dL — ABNORMAL HIGH (ref 70–99)
Potassium: 4.5 mmol/L (ref 3.5–5.1)
SODIUM: 140 mmol/L (ref 135–145)

## 2018-12-03 MED ORDER — FUROSEMIDE 20 MG PO TABS: 20.0000 mg | ORAL_TABLET | Freq: Every day | ORAL | 0 refills | Status: DC

## 2018-12-03 MED ORDER — POTASSIUM CHLORIDE CRYS ER 20 MEQ PO TBCR: 20.0000 meq | EXTENDED_RELEASE_TABLET | Freq: Every day | ORAL | 0 refills | Status: DC

## 2018-12-03 NOTE — Discharge Instructions (Signed)
Heart Failure °Heart failure is a condition in which the heart has trouble pumping blood because it has become weak or stiff. This means that the heart does not pump blood efficiently for the body to work well. For some people with heart failure, fluid may back up into the lungs and there may be swelling (edema) in the lower legs. Heart failure is usually a long-term (chronic) condition. It is important for you to take good care of yourself and follow the treatment plan from your health care provider. °What are the causes? °This condition is caused by some health problems, including: °· High blood pressure (hypertension). Hypertension causes the heart muscle to work harder than normal. High blood pressure eventually causes the heart to become stiff and weak. °· Coronary artery disease (CAD). CAD is the buildup of cholesterol and fat (plaques) in the arteries of the heart. °· Heart attack (myocardial infarction). Injured tissue, which is caused by the heart attack, does not contract as well and the heart's ability to pump blood is weakened. °· Abnormal heart valves. When the heart valves do not open and close properly, the heart muscle must pump harder to keep the blood flowing. °· Heart muscle disease (cardiomyopathy or myocarditis). Heart muscle disease is damage to the heart muscle from a variety of causes, such as drug or alcohol abuse, infections, or unknown causes. These can increase the risk of heart failure. °· Lung disease. When the lungs do not work properly, the heart must work harder. °What increases the risk? °Risk of heart failure increases as a person ages. This condition is also more likely to develop in people who: °· Are overweight. °· Are female. °· Smoke or chew tobacco. °· Abuse alcohol or illegal drugs. °· Have taken medicines that can damage the heart, such as chemotherapy drugs. °· Have diabetes. °? High blood sugar (glucose) is associated with high fat (lipid) levels in the blood. °? Diabetes  can also damage tiny blood vessels that carry nutrients to the heart muscle. °· Have abnormal heart rhythms. °· Have thyroid problems. °· Have low blood counts (anemia). °What are the signs or symptoms? °Symptoms of this condition include: °· Shortness of breath with activity, such as when climbing stairs. °· Persistent cough. °· Swelling of the feet, ankles, legs, or abdomen. °· Unexplained weight gain. °· Difficulty breathing when lying flat (orthopnea). °· Waking from sleep because of the need to sit up and get more air. °· Rapid heartbeat. °· Fatigue and loss of energy. °· Feeling light-headed, dizzy, or close to fainting. °· Loss of appetite. °· Nausea. °· Increased urination during the night (nocturia). °· Confusion. °How is this diagnosed? °This condition is diagnosed based on: °· Medical history, symptoms, and a physical exam. °· Diagnostic tests, which may include: °? Echocardiogram. °? Electrocardiogram (ECG). °? Chest X-ray. °? Blood tests. °? Exercise stress test. °? Radionuclide scans. °? Cardiac catheterization and angiogram. °How is this treated? °Treatment for this condition is aimed at managing the symptoms of heart failure. Medicines, behavioral changes, or other treatments may be necessary to treat heart failure. °Medicines °These may include: °· Angiotensin-converting enzyme (ACE) inhibitors. This type of medicine blocks the effects of a blood protein called angiotensin-converting enzyme. ACE inhibitors relax (dilate) the blood vessels and help to lower blood pressure. °· Angiotensin receptor blockers (ARBs). This type of medicine blocks the actions of a blood protein called angiotensin. ARBs dilate the blood vessels and help to lower blood pressure. °· Water pills (diuretics). Diuretics cause   the kidneys to remove salt and water from the blood. The extra fluid is removed through urination, leaving a lower volume of blood that the heart has to pump. °· Beta blockers. These improve heart muscle  strength and they prevent the heart from beating too quickly. °· Digoxin. This increases the force of the heartbeat. °Healthy behavior changes °These may include: °· Reaching and maintaining a healthy weight. °· Stopping smoking or chewing tobacco. °· Eating heart-healthy foods. °· Limiting or avoiding alcohol. °· Stopping use of street drugs (illegal drugs). °· Physical activity. °Other treatments °These may include: °· Surgery to open blocked coronary arteries or repair damaged heart valves. °· Placement of a biventricular pacemaker to improve heart muscle function (cardiac resynchronization therapy). This device paces both the right ventricle and left ventricle. °· Placement of a device to treat serious abnormal heart rhythms (implantable cardioverter defibrillator, or ICD). °· Placement of a device to improve the pumping ability of the heart (left ventricular assist device, or LVAD). °· Heart transplant. This can cure heart failure, and it is considered for certain patients who do not improve with other therapies. °Follow these instructions at home: °Medicines °· Take over-the-counter and prescription medicines only as told by your health care provider. Medicines are important in reducing the workload of your heart, slowing the progression of heart failure, and improving your symptoms. °? Do not stop taking your medicine unless your health care provider told you to do that. °? Do not skip any dose of medicine. °? Refill your prescriptions before you run out of medicine. You need your medicines every day. °Eating and drinking ° °· Eat heart-healthy foods. Talk with a dietitian to make an eating plan that is right for you. °? Choose foods that contain no trans fat and are low in saturated fat and cholesterol. Healthy choices include fresh or frozen fruits and vegetables, fish, lean meats, legumes, fat-free or low-fat dairy products, and whole-grain or high-fiber foods. °? Limit salt (sodium) if directed by your  health care provider. Sodium restriction may reduce symptoms of heart failure. Ask a dietitian to recommend heart-healthy seasonings. °? Use healthy cooking methods instead of frying. Healthy methods include roasting, grilling, broiling, baking, poaching, steaming, and stir-frying. °· Limit your fluid intake if directed by your health care provider. Fluid restriction may reduce symptoms of heart failure. °Lifestyle ° °· Stop smoking or using chewing tobacco. Nicotine and tobacco can damage your heart and your blood vessels. Do not use nicotine gum or patches before talking to your health care provider. °· Limit alcohol intake to no more than 1 drink per day for non-pregnant women and 2 drinks per day for men. One drink equals 12 oz of beer, 5 oz of wine, or 1½ oz of hard liquor. °? Drinking more than that is harmful to your heart. Tell your health care provider if you drink alcohol several times a week. °? Talk with your health care provider about whether any level of alcohol use is safe for you. °? If your heart has already been damaged by alcohol or you have severe heart failure, drinking alcohol should be stopped completely. °· Stop use of illegal drugs. °· Lose weight if directed by your health care provider. Weight loss may reduce symptoms of heart failure. °· Do moderate physical activity if directed by your health care provider. People who are elderly and people with severe heart failure should consult with a health care provider for physical activity recommendations. °Monitor important information ° °· Weigh   yourself every day. Keeping track of your weight daily helps you to notice excess fluid sooner. °? Weigh yourself every morning after you urinate and before you eat breakfast. °? Wear the same amount of clothing each time you weigh yourself. °? Record your daily weight. Provide your health care provider with your weight record. °· Monitor and record your blood pressure as told by your health care  provider. °· Check your pulse as told by your health care provider. °Dealing with extreme temperatures °· If the weather is extremely hot: °? Avoid vigorous physical activity. °? Use air conditioning or fans or seek a cooler location. °? Avoid caffeine and alcohol. °? Wear loose-fitting, lightweight, and light-colored clothing. °· If the weather is extremely cold: °? Avoid vigorous physical activity. °? Layer your clothes. °? Wear mittens or gloves, a hat, and a scarf when you go outside. °? Avoid alcohol. °General instructions °· Manage other health conditions such as hypertension, diabetes, thyroid disease, or abnormal heart rhythms as told by your health care provider. °· Learn to manage stress. If you need help to do this, ask your health care provider. °· Plan rest periods when fatigued. °· Get ongoing education and support as needed. °· Participate in or seek rehabilitation as needed to maintain or improve independence and quality of life. °· Stay up to date with immunizations. Keeping current on pneumococcal and influenza immunizations is especially important to prevent respiratory infections. °· Keep all follow-up visits as told by your health care provider. This is important. °Contact a health care provider if: °· You have a rapid weight gain. °· You have increasing shortness of breath that is unusual for you. °· You are unable to participate in your usual physical activities. °· You tire easily. °· You cough more than normal, especially with physical activity. °· You have any swelling or more swelling in areas such as your hands, feet, ankles, or abdomen. °· You are unable to sleep because it is hard to breathe. °· You feel like your heart is beating quickly (palpitations). °· You become dizzy or light-headed when you stand up. °Get help right away if: °· You have difficulty breathing. °· You notice or your family notices a change in your awareness, such as having trouble staying awake or having difficulty  with concentration. °· You have pain or discomfort in your chest. °· You have an episode of fainting (syncope). °This information is not intended to replace advice given to you by your health care provider. Make sure you discuss any questions you have with your health care provider. °Document Released: 11/02/2005 Document Revised: 10/01/2017 Document Reviewed: 05/27/2016 °Elsevier Interactive Patient Education © 2019 [employer] ° °

## 2018-12-03 NOTE — Discharge Summary (Signed)
DISCHARGE SUMMARY  Crystal [name]  MR#: [id number]  DOB:December 01, 1933  Date of Admission: 11/29/2018 Date of Discharge: 12/03/2018  Attending Physician:Jeffrey Hennie Duos, MD  Patient's [name], Crystal Levan, MD  Consults: Cardiology   Disposition: D/C home   Follow-up Appts: Follow-up Information    Mosie Lukes, MD Follow up in 1 week(s).   Specialty:  Family Medicine Contact information: [place] [address] [phone]        Lelon Perla, MD .   Specialty:  Cardiology Contact information: [place] [address] [phone]        Volanda Napoleon, MD Follow up in 10 day(s).   Specialty:  Oncology Contact information: [address] [place] [address] [phone]           Tests Needing Follow-up: -Assess renal function with change in diuretic therapy -Assess potassium with ongoing use of diuretic -Assess volume status -Investigate left upper lobe pulmonary mass further  Discharge Diagnoses: Acute hypoxic respiratory failure Acute exacerbation of chronic combined systolic and diastolic congestive heart failure SIRS v/s Sepsis due to ?RUL Community-acquired pneumonia Abnormal UA CKD 3 Hypothyroidism Left apical lung mass History of non-Hodgkin's lymphoma Hypertension   Initial presentation: 85 y.o.femalewith a hx ofanemia, chronic combined systolic and diastolic congestive heart failure, HTN, hypothyroidism, and non-Hodgkin's lymphoma who presented w/ shortness of breath, nonproductive cough, and bilateral pedal edema of a weeks duration.   Hospital Course:  Acute hypoxic respiratory failure Not on O2 at home - VQ scan negative for PE -likely multifactorial to include possible pneumonia, pulmonary edema, lung mass -resolved at time of discharge with saturation 90% or greater on room air  Acute exacerbation of chronic combined systolic and diastolic  congestive heart failure TTE July 2018 showing EF 40 to 45% and grade 1 DD - TTE this admit w/ EF down to 35-40% and grade 2 DD - baseline wgt 61.6kg -transitioned to scheduled Lasix dosing during his hospital stay -renal function stable times discharge  SIRS v/s Sepsis due to ?RUL Community-acquired pneumonia Afebrile th/o admit, w/ normal WBC -completed 5 days of antibiotic therapy   Abnormal UA Urine culture unrevealing - no other clinical signs to suggests active infxn - doubt she had a true UA - 5 day abx course completed     CKD 3 Baseline crt 1.2-1.5 - stable at time of d/c   Hypothyroidism TSH elevated but free T4normal -no change in treatment plan at this time  Left apical lung mass hypermetabolic on PET scan w/ hypermetabolic mediastinal and hilar lymphadenopathy also noted - underwent bronchoscopy w/ biopsies August 2019 w/ path unrevealing - it does not appear that she has seen Dr. Marin Olp since her bx - last telephone note July 06, 2018 notes continued concern for malignancy and a plan to speak to [facility] -continue work-up as outpatient -discussed need to follow-up in short course with Dr. Marin Olp with patient and husband prior to discharge  History of non-Hodgkin's lymphoma Followed by Dr. Marin Olp   Hypertension BP controlled at this time    Allergies as of 12/03/2018      Reactions   Lisinopril Swelling   Facial and lip swelling   Iohexol Itching         Medication List    STOP taking these medications   hydrALAZINE 10 MG tablet Commonly known as:  APRESOLINE   isosorbide mononitrate 30 MG 24 hr tablet Commonly known as:  IMDUR     TAKE these medications   carvedilol 6.25 MG tablet Commonly known as:  COREG Take 1 tablet (6.25 mg total) by mouth 2 (two) times daily.   CENTRUM PO Take 1 tablet by mouth daily.   Fish Oil 1000 MG Caps Take 1,000 mg by mouth daily.   furosemide 20 MG tablet Commonly known as:  LASIX Take 1 tablet (20 mg total) by  mouth daily. Start taking on:  December 04, 2018 What changed:    when to take this  reasons to take this   levothyroxine 25 MCG tablet Commonly known as:  SYNTHROID, LEVOTHROID TAKE 1 TABLET BY MOUTH ONCE DAILY BEFORE BREAKFAST What changed:  See the new instructions.   magnesium hydroxide 400 MG/5ML suspension Commonly known as:  MILK OF MAGNESIA Take 15 mLs by mouth daily as needed for mild constipation.   potassium chloride SA 20 MEQ tablet Commonly known as:  K-DUR,KLOR-CON Take 1 tablet (20 mEq total) by mouth daily. Start taking on:  December 04, 2018   PROBIOTIC DAILY PO Take 1 capsule by mouth daily.       Day of Discharge BP 106/62 (BP Location: Left Arm)   Pulse 83   Temp 97.8 F (36.6 C) (Oral)   Resp 18   Ht 5\' 3"  (1.6 m)   Wt 60.6 kg   SpO2 94%   BMI 23.68 kg/m   Physical Exam: General: No acute respiratory distress Lungs: Clear to auscultation bilaterally without wheezes or crackles Cardiovascular: Regular rate and rhythm without murmur gallop or rub normal S1 and S2 Abdomen: Nontender, nondistended, soft, bowel sounds positive, no rebound, no ascites, no appreciable mass Extremities: No significant cyanosis, clubbing, or edema bilateral lower extremities  Basic Metabolic Panel: Recent Labs  Lab 11/29/18 1228 11/30/18 0419 12/01/18 0455 12/02/18 0338 12/03/18 0441  NA 132* 139 140 139 140  K 3.8 3.6 3.8 4.4 4.5  CL 103 105 107 105 106  CO2 22 24 24 27 24   GLUCOSE 210* 99 99 170* 110*  BUN 20 19 22  27* 32*  CREATININE 1.22* 1.41* 1.36* 1.51* 1.36*  CALCIUM 9.1 9.2 9.1 9.5 9.5    Liver Function Tests: Recent Labs  Lab 11/29/18 1228  AST 39  ALT 51*  ALKPHOS 111  BILITOT 0.7  PROT 8.4*  ALBUMIN 2.9*   Recent Labs  Lab 11/29/18 1228  LIPASE 26    CBC: Recent Labs  Lab 11/29/18 1228 12/01/18 0455 12/02/18 0338  WBC 6.1 4.5 4.2  NEUTROABS 3.9  --   --   HGB 11.3* 10.3* 11.2*  HCT 36.2 32.2* 35.9*  MCV 90.3 89.2 87.8    PLT 243 201 223    Cardiac Enzymes: Recent Labs  Lab 11/29/18 1228  TROPONINI <0.03    Recent Results (from the past 240 hour(s))  Blood culture (routine x 2)     Status: None (Preliminary result)   Collection Time: 11/29/18  1:32 PM  Result Value Ref Range Status   Specimen Description   Final    BLOOD RIGHT ANTECUBITAL Performed at [facility], [address] [place], [place] [address]    Special Requests   Final    BOTTLES DRAWN AEROBIC AND ANAEROBIC Blood Culture adequate volume Performed at [facility], [address] [place], [place] [address]    Culture   Final    NO GROWTH 3 DAYS Performed at [facility], [address],  [place] [address]    Report Status PENDING  Incomplete  Blood culture (routine x 2)     Status: None (Preliminary result)   Collection Time: 11/29/18  1:40 PM  Result Value Ref Range Status   Specimen Description   Final    BLOOD LEFT ANTECUBITAL Performed at [facility], [address] [place], [place] [address]    Special Requests   Final    BOTTLES DRAWN AEROBIC AND ANAEROBIC Blood Culture adequate volume Performed at [facility], [address] [place], [place] [address]    Culture   Final    NO GROWTH 3 DAYS Performed at [facility], [place] [address] [place], [place] [address]    Report Status PENDING  Incomplete  Urine culture     Status: None   Collection Time: 11/29/18  2:10 PM  Result Value Ref Range Status   Specimen Description   Final    URINE, CATHETERIZED Performed at [facility], [address] [place], [place] [address]    Special Requests   Final    NONE Performed at [facility], [address] [place], [place] [address]    Culture   Final    Multiple bacterial morphotypes present, none predominant. Suggest appropriate recollection if clinically indicated.   Report Status 11/30/2018 FINAL  Final     Time spent in  discharge (includes decision making & examination of pt): 35 minutes  12/03/2018, 10:51 AM   Cherene Altes, MD [facility] Office  [phone] Pager [phone]  On-Call/Text Page:      [email or web address]      password [email or web address]

## 2018-12-04 LAB — CULTURE, BLOOD (ROUTINE X 2)
Culture: NO GROWTH
Culture: NO GROWTH
SPECIAL REQUESTS: ADEQUATE
Special Requests: ADEQUATE

## 2018-12-05 NOTE — Consult Note (Signed)
            [name] CM Primary Care Navigator  12/05/2018  EUTHA CUDE Dec 05, 1933 [id number]   Went to seepatient at the bedside to identify possible discharge needs butshewasalreadydischargedhomeover the weekend.  Per MD note,patient presented and treated for shortness of breath, non-productive cough, and bilateral pedal edema of a weeks duration that had led to this admission. (acute hypoxic respiratory failure, acute exacerbation of chronic combined systolic and diastolic congestive heart failure, UTI)  Patient has discharge instruction to follow-up withprimary care provider in 1 week and oncology follow-up in 10 days.  Primary care provider's office is listed as providing transition of care (TOC) follow-up.    For additional questions please contact:  Edwena Felty A. Saranya Harlin, BSN, RN-BC [facility] PRIMARY CARE Navigator Cell: [phone]

## 2018-12-06 ENCOUNTER — Other Ambulatory Visit: Payer: Self-pay | Admitting: Family Medicine

## 2018-12-16 NOTE — Progress Notes (Signed)
HPI: FU nonischemic cardiomyopathy. Cardiac catheterization in 2010 showed an ejection fraction of 15-20% and normal coronary arteries. Patient admitted with syncopepreviouslyfelt to potentially be vagally mediated. However given reduced LV function I discussed the possibility of of life vest or ICD. However she declined and preferred only medical therapy. The risk of sudden death was explained. Chest CT 03-Aug-2023 showed left apical mass with pleural mets and pulmonary mets RLL. Followed by Dr Marin Olp. Admitted with CHF 1/20 which improved with IV diuresis. Last echo 1/20 showed EF 35-40, grade 2 DD, severe LAE and moderate RV dysfunction. Since last seen,she has dyspnea with more moderate activities but not routine activities.  No orthopnea, PND, pedal edema, chest pain, palpitations or syncope.  Current Outpatient Medications  Medication Sig Dispense Refill  . carvedilol (COREG) 6.25 MG tablet Take 1 tablet (6.25 mg total) by mouth 2 (two) times daily. 180 tablet 3  . furosemide (LASIX) 20 MG tablet Take 1 tablet (20 mg total) by mouth daily. 30 tablet 0  . levothyroxine (SYNTHROID, LEVOTHROID) 25 MCG tablet TAKE 1 TABLET BY MOUTH ONCE DAILY BEFORE BREAKFAST 30 tablet 0  . magnesium hydroxide (MILK OF MAGNESIA) 400 MG/5ML suspension Take 15 mLs by mouth daily as needed for mild constipation.    . Multiple Vitamins-Minerals (CENTRUM PO) Take 1 tablet by mouth daily.     . Omega-3 Fatty Acids (FISH OIL) 1000 MG CAPS Take 1,000 mg by mouth daily.    . potassium chloride SA (K-DUR,KLOR-CON) 20 MEQ tablet Take 1 tablet (20 mEq total) by mouth daily. 30 tablet 0  . Probiotic Product (PROBIOTIC DAILY PO) Take 1 capsule by mouth daily.      No current facility-administered medications for this visit.      Past Medical History:  Diagnosis Date  . Anemia 08/21/2017  . Anxiety   . Cardiomyopathy    non ischemic NL cos on cath 08/2009, EF to 15-20%, Her follow up  2D echo  in 10/2009 showed  impoved EFo 35-40%  . CHF (congestive heart failure) ([place])   . Depression   . Full dentures   . Hypercalcemia 04/20/2017  . Hypertension   . Hypothyroid   . Lung mass   . Medicare annual wellness visit, subsequent 12/14/2014   Follows with Dr Marin Olp Follows with Dr Deatra Ina, gastroenterology, last colonoscopy in 2012, repeat in 2017 Last Pap 2011, always normal, no need for repeat Last MGM in 2011, no concerns, declines for now Follows with Dr Stanford Breed of cardiology   . Non Hodgkin's lymphoma ([place]) 11/17/1999   -Dr. Marin Olp  . Vitamin D deficiency 10/17/2015  . Wears glasses     Past Surgical History:  Procedure Laterality Date  . DILATION AND CURETTAGE OF UTERUS    . MULTIPLE TOOTH EXTRACTIONS    . NASAL POLYP EXCISION  1962  . TONSILLECTOMY  1952  . VIDEO BRONCHOSCOPY WITH ENDOBRONCHIAL NAVIGATION Right 06/29/2018   Procedure: VIDEO BRONCHOSCOPY WITH ENDOBRONCHIAL NAVIGATION;  Surgeon: Collene Gobble, MD;  Location: [place];  Service: Thoracic;  Laterality: Right;  Marland Kitchen VIDEO BRONCHOSCOPY WITH ENDOBRONCHIAL ULTRASOUND Right 06/29/2018   Procedure: VIDEO BRONCHOSCOPY WITH ENDOBRONCHIAL ULTRASOUND;  Surgeon: Collene Gobble, MD;  Location: [facility];  Service: Thoracic;  Laterality: Right;    Social History   Socioeconomic History  . Marital status: Married    Spouse name: Jeneen Rinks  . Number of children: 5  . Years of education: Not on file  . Highest education level: Not on  file  Occupational History  . Occupation: retired  Scientific laboratory technician  . Financial resource strain: Not on file  . Food insecurity:    Worry: Not on file    Inability: Not on file  . Transportation needs:    Medical: Not on file    Non-medical: Not on file  Tobacco Use  . Smoking status: Never Smoker  . Smokeless tobacco: Never Used  Substance and Sexual Activity  . Alcohol use: Never    Alcohol/week: 0.0 standard drinks    Frequency: Never  . Drug use: Never  . Sexual activity: Yes    Comment: lives with husband,  no dietary restrictions, minimizes dairy  Lifestyle  . Physical activity:    Days per week: Not on file    Minutes per session: Not on file  . Stress: Not on file  Relationships  . Social connections:    Talks on phone: Not on file    Gets together: Not on file    Attends religious service: Not on file    Active member of club or organization: Not on file    Attends meetings of clubs or organizations: Not on file    Relationship status: Not on file  . Intimate partner violence:    Fear of current or ex partner: Not on file    Emotionally abused: Not on file    Physically abused: Not on file    Forced sexual activity: Not on file  Other Topics Concern  . Not on file  Social History Narrative   The patient is married ( husband Sachi Boulay)  She just recently celebrated     her 52nd anniversary.  She has five children.  There is no active     tobacco or alcohol use history.          Smoking Status:  never   Caffeine use/day:  None   Does Patient Exercise:  yes    Family History  Problem Relation Age of Onset  . Diabetes Mother        deceased secondary to diabetes  . Hypertension Mother   . Vision loss Mother   . Pneumonia Father        died age 34 due to complications of pneumonia  . Colon polyps Father   . Liver disease Daughter   . Sarcoidosis Daughter   . Diabetes Sister   . Colon cancer Brother 20  . Cancer Brother   . Obesity Son   . Diabetes Brother   . Kidney disease Brother   . Diabetes Sister   . Sarcoidosis Sister   . Arthritis Sister   . Arthritis Sister   . Diabetes Sister   . Arthritis Sister   . Diabetes Sister   . Hypertension Daughter   . Hypertension Daughter   . Other Other        no obvious premature cardiovascular disease or familial cardiomyopathy is noted  . Alcohol abuse Neg Hx   . Heart disease Neg Hx     ROS: no fevers or chills, productive cough, hemoptysis, dysphasia, odynophagia, melena, hematochezia, dysuria, hematuria, rash,  seizure activity, orthopnea, PND, pedal edema, claudication. Remaining systems are negative.  Physical Exam: Well-developed well-nourished in no acute distress.  Skin is warm and dry.  HEENT is normal.  Neck is supple.  Chest is clear to auscultation with normal expansion.  Cardiovascular exam is regular rate and rhythm.  Abdominal exam nontender or distended. No masses palpated. Extremities show no edema. neuro  grossly intact  A/P  1 chronic systolic congestive heart failure-patient is improved compared to recent hospitalization.  Continue present dose of diuretic.  I will consider resuming spironolactone 12.5 mg in the future if her renal function is stable as well as her blood pressure.  We discussed the importance of fluid restriction and low-sodium diet.  Check potassium and renal function.  2 nonischemic cardiomyopathy-Continue carvedilol.  She has had angioedema with ACE inhibitor in the past.  I would not resume hydralazine/nitrates at this point as her blood pressure is borderline.  We will consider resuming in the future.  Previously declined ICD.  3 hypertension-patient's blood pressure is controlled.  Continue present medications and follow.  4 lung mass-we will arrange follow-up with Dr. Marin Olp.    [name], MD

## 2018-12-26 ENCOUNTER — Encounter: Payer: Self-pay | Admitting: Cardiology

## 2018-12-26 ENCOUNTER — Ambulatory Visit (INDEPENDENT_AMBULATORY_CARE_PROVIDER_SITE_OTHER): Payer: Medicare Other | Admitting: Cardiology

## 2018-12-26 VITALS — BP 108/74 | HR 95 | Ht 63.0 in | Wt 131.4 lb

## 2018-12-26 DIAGNOSIS — I5022 Chronic systolic (congestive) heart failure: Secondary | ICD-10-CM | POA: Diagnosis not present

## 2018-12-26 DIAGNOSIS — I10 Essential (primary) hypertension: Secondary | ICD-10-CM | POA: Diagnosis not present

## 2018-12-26 DIAGNOSIS — I428 Other cardiomyopathies: Secondary | ICD-10-CM

## 2018-12-26 DIAGNOSIS — C3490 Malignant neoplasm of unspecified part of unspecified bronchus or lung: Secondary | ICD-10-CM | POA: Diagnosis not present

## 2018-12-26 NOTE — Patient Instructions (Addendum)
Medication Instructions:  NO CHANGE If you need a refill on your cardiac medications before your next appointment, please call your pharmacy.   Lab work: Your physician recommends that you HAVE LAB WORK TODAY If you have labs (blood work) drawn today and your tests are completely normal, you will receive your results only by: Marland Kitchen MyChart Message (if you have MyChart) OR . A paper copy in the mail If you have any lab test that is abnormal or we need to change your treatment, we will call you to review the results.  Follow-Up: At [facility], you and your health needs are our priority.  As part of our continuing mission to provide you with exceptional heart care, we have created designated Provider Care Teams.  These Care Teams include your primary Cardiologist (physician) and Advanced Practice Providers (APPs -  Physician Assistants and Nurse Practitioners) who all work together to provide you with the care you need, when you need it.  Your physician recommends that you schedule a follow-up appointment in: [place] UP APPOINTMENT WITH DR Marin Olp

## 2018-12-27 LAB — BASIC METABOLIC PANEL
BUN/Creatinine Ratio: 18 (ref 12–28)
BUN: 26 mg/dL (ref 8–27)
CO2: 23 mmol/L (ref 20–29)
Calcium: 10.1 mg/dL (ref 8.7–10.3)
Chloride: 102 mmol/L (ref 96–106)
Creatinine, Ser: 1.41 mg/dL — ABNORMAL HIGH (ref 0.57–1.00)
GFR calc Af Amer: 40 mL/min/{1.73_m2} — ABNORMAL LOW (ref >59)
GFR calc non Af Amer: 35 mL/min/{1.73_m2} — ABNORMAL LOW (ref >59)
Glucose: 94 mg/dL (ref 65–99)
Potassium: 5.1 mmol/L (ref 3.5–5.2)
SODIUM: 138 mmol/L (ref 134–144)

## 2018-12-28 ENCOUNTER — Encounter: Payer: Self-pay | Admitting: *Deleted

## 2019-01-09 ENCOUNTER — Telehealth: Payer: Self-pay | Admitting: *Deleted

## 2019-01-09 NOTE — Telephone Encounter (Signed)
Patient came into office today thinking she had an appointment today.  There was nothing on the schedule today and nothing documented that she called.  She was admitted to the ED for SOB and pneumonia.  Patient did not want to go to ED today as we did not have any openings today.  Brought patient back to talk with her.  She had followed up with cardiology on 12/26/18 and they advised her to follow up with Dr. Marin Olp.  The patient stated to them that she had an appointment already and they did not need to schedule one on 01/19/19.  Spoke with Kenney Houseman with scheduling at Dr. Dicie Beam office.  She got her put on the schedule for 9am tomorrow with the nurse practitioner [facility].  As I was talking with her I did get her pulse ox and it read as 93%-98% at resting and not talking as much.  When talking a lot her rates go down to about 88%.  Patient declined ED again.  Encourage if SOB gets worse or any chest pains to go to ED and not wait for appointment.  Patient agreed.

## 2019-01-09 NOTE — Telephone Encounter (Signed)
She needs an appt in the next 2-4 weeks if possible

## 2019-01-10 ENCOUNTER — Ambulatory Visit (HOSPITAL_BASED_OUTPATIENT_CLINIC_OR_DEPARTMENT_OTHER)
Admission: RE | Admit: 2019-01-10 | Discharge: 2019-01-10 | Disposition: A | Discharge status: Home / Self Care | Payer: Medicare Other | Source: Ambulatory Visit | Attending: Family | Admitting: Family

## 2019-01-10 ENCOUNTER — Inpatient Hospital Stay (HOSPITAL_BASED_OUTPATIENT_CLINIC_OR_DEPARTMENT_OTHER): Payer: Medicare Other | Admitting: Family

## 2019-01-10 ENCOUNTER — Inpatient Hospital Stay: Payer: Medicare Other | Attending: Hematology & Oncology

## 2019-01-10 ENCOUNTER — Other Ambulatory Visit: Payer: Self-pay | Admitting: Family

## 2019-01-10 ENCOUNTER — Other Ambulatory Visit: Payer: Self-pay | Admitting: Family Medicine

## 2019-01-10 ENCOUNTER — Telehealth: Payer: Self-pay | Admitting: Family

## 2019-01-10 ENCOUNTER — Other Ambulatory Visit: Payer: Self-pay

## 2019-01-10 ENCOUNTER — Other Ambulatory Visit: Payer: Self-pay | Admitting: *Deleted

## 2019-01-10 ENCOUNTER — Encounter: Payer: Self-pay | Admitting: Family

## 2019-01-10 VITALS — BP 135/80 | HR 99 | Temp 98.0°F | Resp 18 | Ht 63.0 in | Wt 132.4 lb

## 2019-01-10 DIAGNOSIS — C8331 Diffuse large B-cell lymphoma, lymph nodes of head, face, and neck: Secondary | ICD-10-CM

## 2019-01-10 DIAGNOSIS — R5383 Other fatigue: Secondary | ICD-10-CM | POA: Insufficient documentation

## 2019-01-10 DIAGNOSIS — Z8572 Personal history of non-Hodgkin lymphomas: Secondary | ICD-10-CM | POA: Insufficient documentation

## 2019-01-10 DIAGNOSIS — R918 Other nonspecific abnormal finding of lung field: Secondary | ICD-10-CM | POA: Diagnosis not present

## 2019-01-10 DIAGNOSIS — C8590 Non-Hodgkin lymphoma, unspecified, unspecified site: Secondary | ICD-10-CM | POA: Diagnosis not present

## 2019-01-10 DIAGNOSIS — N179 Acute kidney failure, unspecified: Secondary | ICD-10-CM | POA: Diagnosis not present

## 2019-01-10 DIAGNOSIS — I5023 Acute on chronic systolic (congestive) heart failure: Secondary | ICD-10-CM | POA: Diagnosis not present

## 2019-01-10 DIAGNOSIS — R0602 Shortness of breath: Secondary | ICD-10-CM

## 2019-01-10 DIAGNOSIS — J90 Pleural effusion, not elsewhere classified: Secondary | ICD-10-CM

## 2019-01-10 DIAGNOSIS — I130 Hypertensive heart and chronic kidney disease with heart failure and stage 1 through stage 4 chronic kidney disease, or unspecified chronic kidney disease: Secondary | ICD-10-CM | POA: Diagnosis not present

## 2019-01-10 DIAGNOSIS — R911 Solitary pulmonary nodule: Secondary | ICD-10-CM

## 2019-01-10 DIAGNOSIS — J9621 Acute and chronic respiratory failure with hypoxia: Secondary | ICD-10-CM | POA: Diagnosis not present

## 2019-01-10 DIAGNOSIS — N183 Chronic kidney disease, stage 3 (moderate): Secondary | ICD-10-CM | POA: Diagnosis not present

## 2019-01-10 LAB — CBC WITH DIFFERENTIAL (CANCER CENTER ONLY)
Abs Immature Granulocytes: 0.01 10*3/uL (ref 0.00–0.07)
Basophils Absolute: 0 10*3/uL (ref 0.0–0.1)
Basophils Relative: 1 %
EOS PCT: 2 %
Eosinophils Absolute: 0.1 10*3/uL (ref 0.0–0.5)
HCT: 39.4 % (ref 36.0–46.0)
Hemoglobin: 12.5 g/dL (ref 12.0–15.0)
Immature Granulocytes: 0 %
Lymphocytes Relative: 41 %
Lymphs Abs: 1.7 10*3/uL (ref 0.7–4.0)
MCH: 28.5 pg (ref 26.0–34.0)
MCHC: 31.7 g/dL (ref 30.0–36.0)
MCV: 89.7 fL (ref 80.0–100.0)
MONO ABS: 0.4 10*3/uL (ref 0.1–1.0)
Monocytes Relative: 10 %
Neutro Abs: 1.9 10*3/uL (ref 1.7–7.7)
Neutrophils Relative %: 46 %
Platelet Count: 159 10*3/uL (ref 150–400)
RBC: 4.39 MIL/uL (ref 3.87–5.11)
RDW: 13.6 % (ref 11.5–15.5)
WBC Count: 4.2 10*3/uL (ref 4.0–10.5)
nRBC: 0 % (ref 0.0–0.2)

## 2019-01-10 LAB — CMP (CANCER CENTER ONLY)
ALT: 25 U/L (ref 0–44)
AST: 29 U/L (ref 15–41)
Albumin: 3.7 g/dL (ref 3.5–5.0)
Alkaline Phosphatase: 113 U/L (ref 38–126)
Anion gap: 9 (ref 5–15)
BUN: 30 mg/dL — ABNORMAL HIGH (ref 8–23)
CO2: 28 mmol/L (ref 22–32)
Calcium: 10.3 mg/dL (ref 8.9–10.3)
Chloride: 103 mmol/L (ref 98–111)
Creatinine: 1.49 mg/dL — ABNORMAL HIGH (ref 0.44–1.00)
GFR, Est AFR Am: 37 mL/min — ABNORMAL LOW
GFR, Estimated: 32 mL/min — ABNORMAL LOW
Glucose, Bld: 138 mg/dL — ABNORMAL HIGH (ref 70–99)
Potassium: 4.3 mmol/L (ref 3.5–5.1)
Sodium: 140 mmol/L (ref 135–145)
Total Bilirubin: 0.7 mg/dL (ref 0.3–1.2)
Total Protein: 8.2 g/dL — ABNORMAL HIGH (ref 6.5–8.1)

## 2019-01-10 LAB — LACTATE DEHYDROGENASE: LDH: 288 U/L — ABNORMAL HIGH (ref 98–192)

## 2019-01-10 NOTE — Telephone Encounter (Signed)
Called and spoke with patient regarding appointment for chest x-ray prior to follow up appt in March. Per 2/25 sch msg

## 2019-01-10 NOTE — Telephone Encounter (Signed)
Dr. Marin Olp reviewed and I spoke with Crystal [name] about her CT scan in detail. I also forwarded her scans to Dr. Stanford Breed as it appears she has bilateral effusions associated with CHF. She has not taken her lasix today and states she will take now. She also states that she will call Dr. Youlanda Roys office after we hang up for possible medication management. No questions at this time.

## 2019-01-10 NOTE — Progress Notes (Signed)
Hematology and Oncology Follow Up Visit  Crystal [name] [id number] 1934/08/27 85 y.o. 01/10/2019   Principle Diagnosis:  Diffuse large cell non-Hodgkin's lymphoma-remission  Current Therapy:   Observation   Interim History:  Crystal [name] is here today for hospital follow-up. She was admitted Jan 14-18 for acute hypoxic respiratory failure secondary to CAP. No CT performed during admission. Chest xray showed mass in left upper lobe of the lung.  She is fatigued and states that she easily becomes SOB when talking or with any physical exertion.  No episodes of bleeding, no bruising or petechiae.  No fever, chills, n/v, cough, rash, dizziness, chest pain, palpitations, abdominal pain or changes in bowel or bladder habits.  No swelling, tenderness, numbness or tingling in her extremities.  No lymphadenopathy noted on exam.  Her appetite comes and goes. She is staying hydrated. Her weight is stable.   ECOG Performance Status: 2 - Symptomatic, <50% confined to bed  Medications:  Allergies as of 01/10/2019      Reactions   Lisinopril Swelling   Facial and lip swelling   Iohexol Itching         Medication List       Accurate as of January 10, 2019  9:46 AM. Always use your most recent med list.        carvedilol 6.25 MG tablet Commonly known as:  COREG Take 1 tablet (6.25 mg total) by mouth 2 (two) times daily.   CENTRUM PO Take 1 tablet by mouth daily.   Fish Oil 1000 MG Caps Take 1,000 mg by mouth daily.   furosemide 20 MG tablet Commonly known as:  LASIX Take 1 tablet (20 mg total) by mouth daily.   levothyroxine 25 MCG tablet Commonly known as:  SYNTHROID, LEVOTHROID TAKE 1 TABLET BY MOUTH ONCE DAILY BEFORE BREAKFAST   magnesium hydroxide 400 MG/5ML suspension Commonly known as:  MILK OF MAGNESIA Take 15 mLs by mouth daily as needed for mild constipation.   potassium chloride SA 20 MEQ tablet Commonly known as:  K-DUR,KLOR-CON Take 1 tablet (20 mEq total) by  mouth daily.   PROBIOTIC DAILY PO Take 1 capsule by mouth daily.       Allergies:  Allergies  Allergen Reactions  . Lisinopril Swelling    Facial and lip swelling  . Iohexol Itching         Past Medical History, Surgical history, Social history, and Family History were reviewed and updated.  Review of Systems: All other 10 point review of systems is negative.   Physical Exam:  height is 5\' 3"  (1.6 m) and weight is 132 lb 6.4 oz (60.1 kg). Her oral temperature is 98 F (36.7 C). Her blood pressure is 135/80 and her pulse is 99. Her respiration is 18 and oxygen saturation is 95%.   Wt Readings from Last 3 Encounters:  01/10/19 132 lb 6.4 oz (60.1 kg)  12/26/18 131 lb 6.4 oz (59.6 kg)  12/03/18 133 lb 11.2 oz (60.6 kg)    Ocular: Sclerae unicteric, pupils equal, round and reactive to light Ear-nose-throat: Oropharynx clear, dentition fair Lymphatic: No cervical, supraclavicular or axillary adenopathy Lungs no rales or rhonchi, good excursion bilaterally Heart regular rate and rhythm, no murmur appreciated Abd soft, nontender, positive bowel sounds, no liver or spleen tip palpated on exam, no fluid wave  MSK no focal spinal tenderness, no joint edema Neuro: non-focal, well-oriented, appropriate affect Breasts: Deferred   Lab Results  Component Value Date   WBC 4.2 01/10/2019  HGB 12.5 01/10/2019   HCT 39.4 01/10/2019   MCV 89.7 01/10/2019   PLT 159 01/10/2019   No results found for: FERRITIN, IRON, TIBC, UIBC, IRONPCTSAT Lab Results  Component Value Date   RETICCTPCT 1.1 10/15/2011   RBC 4.39 01/10/2019   RETICCTABS 49.7 10/15/2011   No results found for: KPAFRELGTCHN, LAMBDASER, KAPLAMBRATIO No results found for: IGGSERUM, IGA, IGMSERUM No results found for: Odetta Pink, SPEI   Chemistry      Component Value Date/Time   NA 140 01/10/2019 0907   NA 138 12/26/2018 1557   NA 138 01/21/2017 1021   K  4.3 01/10/2019 0907   K 5.1 01/21/2017 1021   CL 103 01/10/2019 0907   CO2 28 01/10/2019 0907   CO2 30 (H) 01/21/2017 1021   BUN 30 (H) 01/10/2019 0907   BUN 26 12/26/2018 1557   BUN 30.0 (H) 01/21/2017 1021   CREATININE 1.49 (H) 01/10/2019 0907   CREATININE 1.8 (H) 01/21/2017 1021      Component Value Date/Time   CALCIUM 10.3 01/10/2019 0907   CALCIUM 11.1 (H) 01/21/2017 1021   ALKPHOS 113 01/10/2019 0907   ALKPHOS 124 01/21/2017 1021   AST 29 01/10/2019 0907   AST 27 01/21/2017 1021   ALT 25 01/10/2019 0907   ALT 25 01/21/2017 1021   BILITOT 0.7 01/10/2019 0907   BILITOT 0.50 01/21/2017 1021       Impression and Plan: Crystal [name] is a very pleasant 85 yo African American female with history of diffuse large cell non-Hodgkin lymphoma diagnosed and treated over 18 years ago. So far there has been no evidence of recurrence.  She was hospitalized last month and chest xray showed mass in left upper lobe of the lung. This was biopsied in July 2019 but specimen was felt to be poor quality. She did not follow-up with surgeon Dr. Roxan Hockey as Dr. Marin Olp had advised.  We will get a chest CT today without contrast (due to her kidney function) to better assess.  Once these results are available she will follow-up with Dr. Marin Olp to review.  She will contact our office with any questions or concerns. We can certainly see her sooner if need be.   Laverna Peace, NP 2/25/20209:46 AM

## 2019-01-11 ENCOUNTER — Other Ambulatory Visit: Payer: Self-pay | Admitting: Family Medicine

## 2019-01-11 ENCOUNTER — Telehealth: Payer: Self-pay | Admitting: *Deleted

## 2019-01-11 NOTE — Telephone Encounter (Signed)
-----   Message from Lelon Perla, MD sent at 01/10/2019  3:09 PM EST ----- Change Lasix to 40 mg daily.  In 1 week check bmet and BNP. [name], MD ----- Message ----- From: Eliezer Bottom, NP Sent: 01/10/2019   2:24 PM EST To: Lelon Perla, MD  CT noted bilateral pleural effusions concerning for CHF. Nodules stable and left lung mass decreased in size. Dr. Marin Olp is encouraged by this and is not convinced it is malignant. Patient had questions about lasix but states she is still taking. Needs refill on [facility].

## 2019-01-11 NOTE — Telephone Encounter (Signed)
Left message for pt to call.

## 2019-01-12 ENCOUNTER — Telehealth: Payer: Self-pay | Admitting: Cardiology

## 2019-01-12 ENCOUNTER — Emergency Department (HOSPITAL_COMMUNITY): Payer: Medicare Other

## 2019-01-12 ENCOUNTER — Encounter (HOSPITAL_COMMUNITY): Payer: Self-pay | Admitting: Emergency Medicine

## 2019-01-12 ENCOUNTER — Inpatient Hospital Stay (HOSPITAL_COMMUNITY)
Admission: EM | Admit: 2019-01-12 | Discharge: 2019-01-16 | DRG: 291 | Disposition: A | Discharge status: Home / Self Care | Payer: Medicare Other | Attending: Internal Medicine | Admitting: Internal Medicine

## 2019-01-12 DIAGNOSIS — R0602 Shortness of breath: Secondary | ICD-10-CM

## 2019-01-12 DIAGNOSIS — I130 Hypertensive heart and chronic kidney disease with heart failure and stage 1 through stage 4 chronic kidney disease, or unspecified chronic kidney disease: Secondary | ICD-10-CM | POA: Diagnosis present

## 2019-01-12 DIAGNOSIS — Z8249 Family history of ischemic heart disease and other diseases of the circulatory system: Secondary | ICD-10-CM | POA: Diagnosis not present

## 2019-01-12 DIAGNOSIS — C8590 Non-Hodgkin lymphoma, unspecified, unspecified site: Secondary | ICD-10-CM | POA: Diagnosis present

## 2019-01-12 DIAGNOSIS — R0600 Dyspnea, unspecified: Secondary | ICD-10-CM

## 2019-01-12 DIAGNOSIS — C8331 Diffuse large B-cell lymphoma, lymph nodes of head, face, and neck: Secondary | ICD-10-CM | POA: Diagnosis not present

## 2019-01-12 DIAGNOSIS — N183 Chronic kidney disease, stage 3 (moderate): Secondary | ICD-10-CM | POA: Diagnosis present

## 2019-01-12 DIAGNOSIS — R590 Localized enlarged lymph nodes: Secondary | ICD-10-CM | POA: Diagnosis present

## 2019-01-12 DIAGNOSIS — J90 Pleural effusion, not elsewhere classified: Secondary | ICD-10-CM | POA: Diagnosis not present

## 2019-01-12 DIAGNOSIS — Z79899 Other long term (current) drug therapy: Secondary | ICD-10-CM

## 2019-01-12 DIAGNOSIS — N179 Acute kidney failure, unspecified: Secondary | ICD-10-CM | POA: Diagnosis present

## 2019-01-12 DIAGNOSIS — Z91041 Radiographic dye allergy status: Secondary | ICD-10-CM | POA: Diagnosis not present

## 2019-01-12 DIAGNOSIS — Z888 Allergy status to other drugs, medicaments and biological substances status: Secondary | ICD-10-CM | POA: Diagnosis not present

## 2019-01-12 DIAGNOSIS — Z79890 Hormone replacement therapy (postmenopausal): Secondary | ICD-10-CM | POA: Diagnosis not present

## 2019-01-12 DIAGNOSIS — J9621 Acute and chronic respiratory failure with hypoxia: Secondary | ICD-10-CM | POA: Diagnosis present

## 2019-01-12 DIAGNOSIS — E782 Mixed hyperlipidemia: Secondary | ICD-10-CM | POA: Diagnosis present

## 2019-01-12 DIAGNOSIS — I5023 Acute on chronic systolic (congestive) heart failure: Secondary | ICD-10-CM | POA: Diagnosis present

## 2019-01-12 DIAGNOSIS — Z841 Family history of disorders of kidney and ureter: Secondary | ICD-10-CM

## 2019-01-12 DIAGNOSIS — J984 Other disorders of lung: Secondary | ICD-10-CM | POA: Diagnosis not present

## 2019-01-12 DIAGNOSIS — I428 Other cardiomyopathies: Secondary | ICD-10-CM | POA: Diagnosis present

## 2019-01-12 DIAGNOSIS — Z8261 Family history of arthritis: Secondary | ICD-10-CM | POA: Diagnosis not present

## 2019-01-12 DIAGNOSIS — F419 Anxiety disorder, unspecified: Secondary | ICD-10-CM | POA: Diagnosis present

## 2019-01-12 DIAGNOSIS — I10 Essential (primary) hypertension: Secondary | ICD-10-CM | POA: Diagnosis present

## 2019-01-12 DIAGNOSIS — F329 Major depressive disorder, single episode, unspecified: Secondary | ICD-10-CM | POA: Diagnosis present

## 2019-01-12 DIAGNOSIS — Z951 Presence of aortocoronary bypass graft: Secondary | ICD-10-CM

## 2019-01-12 DIAGNOSIS — N1831 Chronic kidney disease, stage 3a: Secondary | ICD-10-CM | POA: Diagnosis present

## 2019-01-12 DIAGNOSIS — I5043 Acute on chronic combined systolic (congestive) and diastolic (congestive) heart failure: Secondary | ICD-10-CM | POA: Diagnosis present

## 2019-01-12 DIAGNOSIS — R918 Other nonspecific abnormal finding of lung field: Secondary | ICD-10-CM | POA: Diagnosis present

## 2019-01-12 DIAGNOSIS — E876 Hypokalemia: Secondary | ICD-10-CM | POA: Diagnosis present

## 2019-01-12 DIAGNOSIS — E039 Hypothyroidism, unspecified: Secondary | ICD-10-CM | POA: Diagnosis present

## 2019-01-12 LAB — PROTIME-INR
INR: 1.3 — ABNORMAL HIGH (ref 0.8–1.2)
INR: 1.3 — AB (ref 0.8–1.2)
Prothrombin Time: 15.8 seconds — ABNORMAL HIGH (ref 11.4–15.2)
Prothrombin Time: 16.2 seconds — ABNORMAL HIGH (ref 11.4–15.2)

## 2019-01-12 LAB — POCT I-STAT 7, (LYTES, BLD GAS, ICA,H+H)
Acid-base deficit: 2 mmol/L (ref 0.0–2.0)
Bicarbonate: 23.5 mmol/L (ref 20.0–28.0)
Calcium, Ion: 1.26 mmol/L (ref 1.15–1.40)
HCT: 34 % — ABNORMAL LOW (ref 36.0–46.0)
Hemoglobin: 11.6 g/dL — ABNORMAL LOW (ref 12.0–15.0)
O2 Saturation: 99 %
Potassium: 3.6 mmol/L (ref 3.5–5.1)
Sodium: 135 mmol/L (ref 135–145)
TCO2: 25 mmol/L (ref 22–32)
pCO2 arterial: 41 mmHg (ref 32.0–48.0)
pH, Arterial: 7.366 (ref 7.350–7.450)
pO2, Arterial: 160 mmHg — ABNORMAL HIGH (ref 83.0–108.0)

## 2019-01-12 LAB — CBC
HCT: 38.2 % (ref 36.0–46.0)
Hemoglobin: 11.7 g/dL — ABNORMAL LOW (ref 12.0–15.0)
MCH: 28 pg (ref 26.0–34.0)
MCHC: 30.6 g/dL (ref 30.0–36.0)
MCV: 91.4 fL (ref 80.0–100.0)
Platelets: 156 10*3/uL (ref 150–400)
RBC: 4.18 MIL/uL (ref 3.87–5.11)
RDW: 13.6 % (ref 11.5–15.5)
WBC: 5.7 10*3/uL (ref 4.0–10.5)
nRBC: 0 % (ref 0.0–0.2)

## 2019-01-12 LAB — LACTIC ACID, PLASMA
LACTIC ACID, VENOUS: 4.1 mmol/L — AB (ref 0.5–1.9)
Lactic Acid, Venous: 1.7 mmol/L (ref 0.5–1.9)

## 2019-01-12 LAB — COMPREHENSIVE METABOLIC PANEL
ALK PHOS: 103 U/L (ref 38–126)
ALT: 53 U/L — ABNORMAL HIGH (ref 0–44)
AST: 67 U/L — ABNORMAL HIGH (ref 15–41)
Albumin: 3 g/dL — ABNORMAL LOW (ref 3.5–5.0)
Anion gap: 12 (ref 5–15)
BILIRUBIN TOTAL: 0.9 mg/dL (ref 0.3–1.2)
BUN: 23 mg/dL (ref 8–23)
CO2: 22 mmol/L (ref 22–32)
Calcium: 9.2 mg/dL (ref 8.9–10.3)
Chloride: 97 mmol/L — ABNORMAL LOW (ref 98–111)
Creatinine, Ser: 1.62 mg/dL — ABNORMAL HIGH (ref 0.44–1.00)
GFR calc Af Amer: 34 mL/min — ABNORMAL LOW (ref >60)
GFR calc non Af Amer: 29 mL/min — ABNORMAL LOW (ref >60)
Glucose, Bld: 280 mg/dL — ABNORMAL HIGH (ref 70–99)
Potassium: 4.1 mmol/L (ref 3.5–5.1)
Sodium: 131 mmol/L — ABNORMAL LOW (ref 135–145)
Total Protein: 8.2 g/dL — ABNORMAL HIGH (ref 6.5–8.1)

## 2019-01-12 LAB — BRAIN NATRIURETIC PEPTIDE: B Natriuretic Peptide: 3955.3 pg/mL — ABNORMAL HIGH (ref 0.0–100.0)

## 2019-01-12 LAB — I-STAT TROPONIN, ED: Troponin i, poc: 0.05 ng/mL (ref 0.00–0.08)

## 2019-01-12 LAB — D-DIMER, QUANTITATIVE: D-Dimer, Quant: >20 ug/mL-FEU — ABNORMAL HIGH (ref 0.00–0.50)

## 2019-01-12 MED ORDER — HEPARIN SODIUM (PORCINE) 5000 UNIT/ML IJ SOLN
5000.0000 [IU] | Freq: Three times a day (TID) | INTRAMUSCULAR | Status: DC
  Administered 2019-01-13 – 2019-01-16 (×9): 5000 [IU] via SUBCUTANEOUS
  Filled 2019-01-12 (×9): qty 1

## 2019-01-12 MED ORDER — SODIUM CHLORIDE 0.9 % IV SOLN
Freq: Once | INTRAVENOUS | Status: AC
  Administered 2019-01-12: 20:00:00 via INTRAVENOUS

## 2019-01-12 MED ORDER — SODIUM CHLORIDE 0.9% FLUSH
3.0000 mL | Freq: Two times a day (BID) | INTRAVENOUS | Status: DC
  Administered 2019-01-12 – 2019-01-16 (×8): 3 mL via INTRAVENOUS

## 2019-01-12 MED ORDER — SODIUM CHLORIDE 0.9 % IV BOLUS
500.0000 mL | Freq: Once | INTRAVENOUS | Status: AC
  Administered 2019-01-12: 500 mL via INTRAVENOUS

## 2019-01-12 MED ORDER — SODIUM CHLORIDE 0.9% FLUSH: 3.0000 mL | INTRAVENOUS | Status: DC | PRN

## 2019-01-12 MED ORDER — ALBUTEROL SULFATE (2.5 MG/3ML) 0.083% IN NEBU
5.0000 mg | INHALATION_SOLUTION | Freq: Once | RESPIRATORY_TRACT | Status: AC
  Administered 2019-01-12: 5 mg via RESPIRATORY_TRACT

## 2019-01-12 MED ORDER — VANCOMYCIN HCL IN DEXTROSE 1-5 GM/200ML-% IV SOLN: 1000.0000 mg | INTRAVENOUS | Status: DC

## 2019-01-12 MED ORDER — ACETAMINOPHEN 325 MG PO TABS: 650.0000 mg | ORAL_TABLET | ORAL | Status: DC | PRN

## 2019-01-12 MED ORDER — LEVOTHYROXINE SODIUM 25 MCG PO TABS
25.0000 ug | ORAL_TABLET | Freq: Every day | ORAL | Status: DC
  Administered 2019-01-14 – 2019-01-16 (×2): 25 ug via ORAL
  Filled 2019-01-12 (×2): qty 1

## 2019-01-12 MED ORDER — FUROSEMIDE 10 MG/ML IJ SOLN
20.0000 mg | Freq: Once | INTRAMUSCULAR | Status: AC
  Administered 2019-01-12: 20 mg via INTRAVENOUS
  Filled 2019-01-12: qty 2

## 2019-01-12 MED ORDER — SODIUM CHLORIDE 0.9 % IV SOLN: 250.0000 mL | INTRAVENOUS | Status: DC | PRN

## 2019-01-12 MED ORDER — SODIUM CHLORIDE 0.9 % IV SOLN
2.0000 g | Freq: Once | INTRAVENOUS | Status: AC
  Administered 2019-01-12: 2 g via INTRAVENOUS
  Filled 2019-01-12: qty 2

## 2019-01-12 MED ORDER — ONDANSETRON HCL 4 MG/2ML IJ SOLN: 4.0000 mg | Freq: Four times a day (QID) | INTRAMUSCULAR | Status: DC | PRN

## 2019-01-12 MED ORDER — FUROSEMIDE 10 MG/ML IJ SOLN
40.0000 mg | Freq: Two times a day (BID) | INTRAMUSCULAR | Status: DC
  Administered 2019-01-13: 40 mg via INTRAVENOUS
  Filled 2019-01-12 (×2): qty 4

## 2019-01-12 MED ORDER — CARVEDILOL 6.25 MG PO TABS
6.2500 mg | ORAL_TABLET | Freq: Two times a day (BID) | ORAL | Status: DC
  Administered 2019-01-13 – 2019-01-16 (×5): 6.25 mg via ORAL
  Filled 2019-01-12 (×6): qty 1

## 2019-01-12 MED ORDER — SODIUM CHLORIDE 0.9 % IV SOLN
1.0000 g | INTRAVENOUS | Status: DC
  Filled 2019-01-12: qty 1

## 2019-01-12 MED ORDER — SODIUM CHLORIDE 0.9% FLUSH: 3.0000 mL | Freq: Once | INTRAVENOUS | Status: DC

## 2019-01-12 MED ORDER — VANCOMYCIN HCL 10 G IV SOLR
1250.0000 mg | Freq: Once | INTRAVENOUS | Status: AC
  Administered 2019-01-12: 1250 mg via INTRAVENOUS
  Filled 2019-01-12: qty 1250

## 2019-01-12 NOTE — ED Triage Notes (Signed)
Pt visibly SOB. Pt unable to talk in complete sentences. Pt states she has been increasingly SOB/ audibly wheezing.

## 2019-01-12 NOTE — Progress Notes (Signed)
Patient stated she was having some increased shortness of breath after using the restroom. Placed patient back on bipap.

## 2019-01-12 NOTE — Telephone Encounter (Signed)
Triage call received from the operator. Spoke wit the patient's husband who is calling on the patient's behalf. Mr.Carriero sts that the patient is currently having active chest pain and sob. The chest pain started on Mon 01/09/19 and has been intermittent. The chest pain has  gotten progressively worse and has increased in frequency and duration. Adv Mr.Yoho that the patient needs to be taken to the ED for evaluation. I asked if he is able to drive her, if not he needs to call EMS for her to be transported. Mr.Sockwell sts that he can drive her, he is not sure which ED to take her [facility] and [facility] are about the same distance. Adv him to take the patient to [facility] ED asap. Mr.Mclin is agreeable and they will head there now.  Update FYI sent to Dr.Crenshaw.

## 2019-01-12 NOTE — ED Notes (Signed)
Pt placed on bipap by RT

## 2019-01-12 NOTE — ED Notes (Signed)
Unable to get oral temp

## 2019-01-12 NOTE — H&P (Signed)
History and Physical   Crystal [name] OEV:[id number] DOB: 1934/10/13 DOA: 01/12/2019  Referring MD/NP/PA: Dr. Francia Greaves  PCP: Mosie Lukes, MD   Outpatient Specialists: Dr. Stanford Breed  Patient coming from: Home  Chief Complaint: Shortness of breath  HPI: Crystal [name] is a 85 y.o. 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  At that point she had echocardiogram showing EF of 35 to 40%.  Patient has had progressive shortness of breath since then at home.  She was evaluated 2 days ago with noncontrasted CT due to her poor GFR.  That showed bilateral pleural effusions but also multiple findings in the lungs some nodules lymphadenopathy as well as her cavitary lung mass.  Patient came back again today in severe respiratory distress.  Initial oxygen sats in the 60s.  She has been placed on BiPAP.  Chest x-ray suggested infiltrates as well as edema.  She has been admitted with acute on chronic respiratory failure most likely CHF exacerbation but probably concomitant pneumonia..  ED Course: Temperature 96.9 with blood pressure initially 154/85 her pulse 97 respiratory rate 40 and oxygen sats 63% on room air. ABG showed a pH of 7.366 PCO2 41 and PO2 160 after BiPAP.  Her white count is 5.7 hemoglobin 11.6 creatinine 1.62 INR 1.3 and glucose 280. Chest x-ray showed perihilar opacities and bibasilar opacities left greater than right.  Suspected atelectasis or infiltrates.  Also cardiomegaly with vascular congestion.  Patient received a dose of Lasix placed on BiPAP and is improving. she is being admitted to the hospital for treatment.  Review of Systems: As per HPI otherwise 10 point review of systems negative.    Past Medical History:  Diagnosis Date  . Anemia 08/21/2017    . Anxiety   . Cardiomyopathy    non ischemic NL cos on cath 08/2009, EF to 15-20%, Her follow up  2D echo  in 10/2009 showed impoved EFo 35-40%  . CHF (congestive heart failure) ([place])   . Depression   . Full dentures   . Hypercalcemia 04/20/2017  . Hypertension   . Hypothyroid   . Lung mass   . Medicare annual wellness visit, subsequent 12/14/2014   Follows with Dr Marin Olp Follows with Dr Deatra Ina, gastroenterology, last colonoscopy in 2012, repeat in 2017 Last Pap 2011, always normal, no need for repeat Last MGM in 2011, no concerns, declines for now Follows with Dr Stanford Breed of cardiology   . Non Hodgkin's lymphoma ([place]) 11/17/1999   -Dr. Marin Olp  . Vitamin D deficiency 10/17/2015  . Wears glasses     Past Surgical History:  Procedure Laterality Date  . DILATION AND CURETTAGE OF UTERUS    . MULTIPLE TOOTH EXTRACTIONS    . NASAL POLYP EXCISION  1962  . TONSILLECTOMY  1952  . VIDEO BRONCHOSCOPY WITH ENDOBRONCHIAL NAVIGATION Right 06/29/2018   Procedure: VIDEO BRONCHOSCOPY WITH ENDOBRONCHIAL NAVIGATION;  Surgeon: Collene Gobble, MD;  Location: [place];  Service: Thoracic;  Laterality: Right;  Marland Kitchen VIDEO BRONCHOSCOPY WITH ENDOBRONCHIAL ULTRASOUND Right 06/29/2018   Procedure: VIDEO BRONCHOSCOPY WITH ENDOBRONCHIAL ULTRASOUND;  Surgeon: Collene Gobble, MD;  Location: [place];  Service: Thoracic;  Laterality: Right;     reports that she has never smoked. She has never used smokeless tobacco. She reports that she does not drink  alcohol or use drugs.  Allergies  Allergen Reactions  . Lisinopril Swelling    Facial and lip swelling  . Iohexol Itching         Family History  Problem Relation Age of Onset  . Diabetes Mother        deceased secondary to diabetes  . Hypertension Mother   . Vision loss Mother   . Pneumonia Father        died age 86 due to complications of pneumonia  . Colon polyps Father   . Liver disease Daughter   . Sarcoidosis Daughter   . Diabetes Sister   . Colon  cancer Brother 70  . Cancer Brother   . Obesity Son   . Diabetes Brother   . Kidney disease Brother   . Diabetes Sister   . Sarcoidosis Sister   . Arthritis Sister   . Arthritis Sister   . Diabetes Sister   . Arthritis Sister   . Diabetes Sister   . Hypertension Daughter   . Hypertension Daughter   . Other Other        no obvious premature cardiovascular disease or familial cardiomyopathy is noted  . Alcohol abuse Neg Hx   . Heart disease Neg Hx      Prior to Admission medications   Medication Sig Start Date End Date Taking? Authorizing Provider  carvedilol (COREG) 6.25 MG tablet Take 1 tablet (6.25 mg total) by mouth 2 (two) times daily. 02/21/18   Mosie Lukes, MD  furosemide (LASIX) 20 MG tablet Take 1 tablet (20 mg total) by mouth daily. 12/04/18   Cherene Altes, MD  levothyroxine (SYNTHROID, LEVOTHROID) 25 MCG tablet TAKE 1 TABLET BY MOUTH ONCE DAILY BEFORE BREAKFAST 01/11/19   Mosie Lukes, MD  magnesium hydroxide (MILK OF MAGNESIA) 400 MG/5ML suspension Take 15 mLs by mouth daily as needed for mild constipation.    [provider]  Multiple Vitamins-Minerals (CENTRUM PO) Take 1 tablet by mouth daily.     [provider]  Omega-3 Fatty Acids (FISH OIL) 1000 MG CAPS Take 1,000 mg by mouth daily.    [provider]  potassium chloride SA (K-DUR,KLOR-CON) 20 MEQ tablet Take 1 tablet (20 mEq total) by mouth daily. 12/04/18   Cherene Altes, MD  Probiotic Product (PROBIOTIC DAILY PO) Take 1 capsule by mouth daily.     [provider]    Physical Exam: Vitals:   01/12/19 1955 01/12/19 2008 01/12/19 2045 01/12/19 2100  BP:  109/66 100/75 106/73  Pulse: 85 80 74 69  Resp: (!) 32 (!) 24 19 18   Temp:      TempSrc:      SpO2: 99% 100% 100% 100%      Constitutional: NAD, calm, on BiPAP and struggling to breathe talking in short sentences Vitals:   01/12/19 1955 01/12/19 2008 01/12/19 2045 01/12/19 2100  BP:  109/66 100/75 106/73   Pulse: 85 80 74 69  Resp: (!) 32 (!) 24 19 18   Temp:      TempSrc:      SpO2: 99% 100% 100% 100%   Eyes: PERRL, lids and conjunctivae normal ENMT: Mucous membranes are moist. Posterior pharynx clear of any exudate or lesions.Normal dentition.  Neck: normal, supple, no masses, no thyromegaly Respiratory: Decreased air entry bilaterally with marked expiratory wheezing, crackles and rails increased respiratory effort.  Some accessory muscle use.  Cardiovascular: Regular rate and rhythm, no murmurs / rubs / gallops. 1+ extremity edema. 2+  pedal pulses. No carotid bruits.  Abdomen: no tenderness, no masses palpated. No hepatosplenomegaly. Bowel sounds positive.  Musculoskeletal: no clubbing / cyanosis. No joint deformity upper and lower extremities. Good ROM, no contractures. Normal muscle tone.  Skin: no rashes, lesions, ulcers. No induration Neurologic: CN 2-12 grossly intact. Sensation intact, DTR normal. Strength 5/5 in all 4.  Psychiatric: Normal judgment and insight. Alert and oriented x 3. Normal mood.     Labs on Admission: I have personally reviewed following labs and imaging studies  CBC: Recent Labs  Lab 01/10/19 0907 01/12/19 1659 01/12/19 1759  WBC 4.2 5.7  --   NEUTROABS 1.9  --   --   HGB 12.5 11.7* 11.6*  HCT 39.4 38.2 34.0*  MCV 89.7 91.4  --   PLT 159 156  --    Basic Metabolic Panel: Recent Labs  Lab 01/10/19 0907 01/12/19 1701 01/12/19 1759  NA 140 131* 135  K 4.3 4.1 3.6  CL 103 97*  --   CO2 28 22  --   GLUCOSE 138* 280*  --   BUN 30* 23  --   CREATININE 1.49* 1.62*  --   CALCIUM 10.3 9.2  --    GFR: Estimated Creatinine Clearance: 21.8 mL/min (A) (by C-G formula based on SCr of 1.62 mg/dL (H)). Liver Function Tests: Recent Labs  Lab 01/10/19 0907 01/12/19 1701  AST 29 67*  ALT 25 53*  ALKPHOS 113 103  BILITOT 0.7 0.9  PROT 8.2* 8.2*  ALBUMIN 3.7 3.0*   No results for input(s): LIPASE, AMYLASE in the last 168 hours. No results for  input(s): AMMONIA in the last 168 hours. Coagulation Profile: Recent Labs  Lab 01/12/19 1702 01/12/19 1830  INR 1.3* 1.3*   Cardiac Enzymes: No results for input(s): CKTOTAL, CKMB, CKMBINDEX, TROPONINI in the last 168 hours. BNP (last 3 results) No results for input(s): PROBNP in the last 8760 hours. HbA1C: No results for input(s): HGBA1C in the last 72 hours. CBG: No results for input(s): GLUCAP in the last 168 hours. Lipid Profile: No results for input(s): CHOL, HDL, LDLCALC, TRIG, CHOLHDL, LDLDIRECT in the last 72 hours. Thyroid Function Tests: No results for input(s): TSH, T4TOTAL, FREET4, T3FREE, THYROIDAB in the last 72 hours. Anemia Panel: No results for input(s): VITAMINB12, FOLATE, FERRITIN, TIBC, IRON, RETICCTPCT in the last 72 hours. Urine analysis:    Component Value Date/Time   COLORURINE YELLOW 11/29/2018 1410   APPEARANCEUR HAZY (A) 11/29/2018 1410   LABSPEC 1.010 11/29/2018 1410   PHURINE 6.0 11/29/2018 1410   GLUCOSEU NEGATIVE 11/29/2018 1410   GLUCOSEU NEGATIVE 10/20/2016 1119   HGBUR TRACE (A) 11/29/2018 1410   BILIRUBINUR NEGATIVE 11/29/2018 1410   BILIRUBINUR negative 06/22/2016 1047   BILIRUBINUR negative 06/11/2015 0953   KETONESUR NEGATIVE 11/29/2018 1410   PROTEINUR NEGATIVE 11/29/2018 1410   UROBILINOGEN 0.2 10/20/2016 1119   NITRITE POSITIVE (A) 11/29/2018 1410   LEUKOCYTESUR MODERATE (A) 11/29/2018 1410   Sepsis Labs: @LABRCNTIP (procalcitonin:4,lacticidven:4) )No results found for this or any previous visit (from the past 240 hour(s)).   Radiological Exams on Admission: Dg Chest Port 1 View  Result Date: 01/12/2019 CLINICAL DATA:  Shortness of breath EXAM: PORTABLE CHEST 1 VIEW COMPARISON:  11/29/2018 FINDINGS: Cardiomegaly. Perihilar and lower lobe airspace opacities are noted. Suspect small left effusion. No acute bony abnormality. IMPRESSION: Perihilar opacities and bibasilar opacities, left greater than right. This could reflect  atelectasis or infiltrates. Cardiomegaly, vascular congestion Electronically Signed   By: Rolm Baptise M.D.  On: 01/12/2019 17:47    EKG: Independently reviewed. It shows normal sinus rhythm with a rate of 95, normal intervals.  Left axis deviation and right bundle branch block.  Nonspecific ST changes.  Assessment/Plan Principal Problem:   Acute on chronic respiratory failure with hypoxemia (HCC) Active Problems:   Non Hodgkin's lymphoma (HCC)   Hypothyroidism   Essential hypertension   Nonischemic cardiomyopathy (HCC)   Cavitating mass in left upper lung lobe   Acute on chronic systolic CHF (congestive heart failure), NYHA class 4 (HCC)   CKD (chronic kidney disease) stage 3, GFR 30-59 ml/min (HCC)     #1 acute on chronic respiratory failure with hypoxemia: Suspected secondary to CHF exacerbation.  Pneumonia cannot be entirely excluded.  Patient will be admitted to progressive care unit.  Titrate off BiPAP.  Gentle diuresis as blood pressure is soft.  Empirically start her on vancomycin and cefepime for healthcare associated pneumonia.  If no fever or no white count again in the next 24 hours antibiotics can be discontinued with.  She does have systolic dysfunction on oral Lasix at home.  Continue with other cardiac medicines including beta blockers.  Not able to use ACE inhibitors due to chronic kidney disease.  May consider cardiology consultation.  #2 acute on chronic CHF exacerbation: Systolic dysfunction as noted above.  Continue care as per #1.  #3 non-Hodgkin's lymphoma: On treatment.  Patient has cavitary lung lesions.  Some of the noted infiltrates may be related to her lymphadenopathy.  Continue monitor and follow-up with her oncologist.  #4 chronic kidney disease stage III: Unable to do CT angiogram.  D-dimer elevated more than 20 which is so nonspecific.  She had a VQ scan a month ago that was low probability for PE.  Continue monitoring her renal function.  #5 hypertension:  Blood pressure is down at the moment.  Hold blood pressure medications until blood pressure rebounds.  #6 hypothyroidism: Continue levothyroxine from home.  May check TSH.   DVT prophylaxis: Heparin Code Status: Full code Family Communication: No family at bedside discussed care with patient Disposition Plan: To be determined Consults called: None but cardiology may be consulted Admission status: Inpatient to progressive care unit  Severity of Illness: The appropriate patient status for this patient is INPATIENT. Inpatient status is judged to be reasonable and necessary in order to provide the required intensity of service to ensure the patient's safety. The patient's presenting symptoms, physical exam findings, and initial radiographic and laboratory data in the context of their chronic comorbidities is felt to place them at high risk for further clinical deterioration. Furthermore, it is not anticipated that the patient will be medically stable for discharge from the hospital within 2 midnights of admission. The following factors support the patient status of inpatient.   " The patient's presenting symptoms include shortness of breath cough and wheezing. " The worrisome physical exam findings include significant hypoxemia oxygen sat of 63%. " The initial radiographic and laboratory data are worrisome because of chest x-ray showing infiltrates and pulmonary vascular congestion. " The chronic co-morbidities include non-Hodgkin's lymphoma and systolic dysfunction.   * I certify that at the point of admission it is my clinical judgment that the patient will require inpatient hospital care spanning beyond 2 midnights from the point of admission due to high intensity of service, high risk for further deterioration and high frequency of surveillance required.Barbette Merino MD [facility] Pager [phone]  If 7PM-7AM, please contact night-coverage  [email or web address] Password  [facility]  01/12/2019, 9:35 PM

## 2019-01-12 NOTE — Progress Notes (Signed)
Pharmacy Antibiotic Note  Crystal [name] is a 85 y.o. female admitted on 01/12/2019 with pneumonia.  Pharmacy has been consulted for vancomycin/cefepime dosing.  Presenting with SOB w/ wheezing. CXR showing perihilar opacities and bibasilar opacities (L>R). On Bipap currently. WBC WNL, LA 4.1, temp 96.9. Scr 1.62 (CrCl 22 mL/min).   Plan: Cefepime 1 g IV every 24 hour Vancomycin 1250 mg IV once then 1000 mg IV every 48 hours Monitor renal fx, cx results, clinical pic, and levels as appropriate    Temp (24hrs), Avg:96.9 F (36.1 C), Min:96.9 F (36.1 C), Max:96.9 F (36.1 C)  Recent Labs  Lab 01/10/19 0907  WBC 4.2  CREATININE 1.49*    Estimated Creatinine Clearance: 23.7 mL/min (A) (by C-G formula based on SCr of 1.49 mg/dL (H)).    Allergies  Allergen Reactions  . Lisinopril Swelling    Facial and lip swelling  . Iohexol Itching         Antimicrobials this admission: Vancomycin 2/27 >>  Cefepime 2/27 >>   Dose adjustments this admission: N/A  Microbiology results: 2/27 BCx: sent  Thank you for allowing pharmacy to be a part of this patient's care.  Antonietta Jewel, PharmD, [place] Clinical Pharmacist  Pager: [phone] Phone: [phone] 01/12/2019 5:18 PM

## 2019-01-12 NOTE — Telephone Encounter (Signed)
New Message   Pt c/o of Chest Pain: STAT if CP now or developed within 24 hours  1. Are you having CP right now? Yes   2. Are you experiencing any other symptoms (ex. SOB, nausea, vomiting, sweating)? SOB  3. How long have you been experiencing CP? Been a recurring problem but PTs husband says since Monday she has been experiencing pain   4. Is your CP continuous or coming and going? Comes and goes   5. Have you taken Nitroglycerin? No   PTs husband is calling because his wife is experiencing CP and SOB and wants to know what he should do   ?

## 2019-01-12 NOTE — Progress Notes (Signed)
Placed patient back on bipap

## 2019-01-12 NOTE — ED Notes (Signed)
Assisted pt with using bedpan. She maintained 97% sat but requested to be placed back on bipap. Called RT.

## 2019-01-12 NOTE — Progress Notes (Signed)
Per MD, placed pt on bipap first, then obtain ABG after.  Pt placed on bipap and appears to be tol well currently. Pt states she feels she is "getting enough air" on bipap.  Sat 100%.

## 2019-01-12 NOTE — Progress Notes (Signed)
Patient stated she was breathing easier at this time. Removed patient from bipap and placed her on 2lpm nasal cannula. Nurse is aware and will continue to monitor patient.

## 2019-01-12 NOTE — ED Notes (Signed)
Bipap removed and placed on [place] by RT

## 2019-01-12 NOTE — ED Provider Notes (Signed)
[facility] EMERGENCY DEPARTMENT Provider Note   CSN: [id number] Arrival date & time: 01/12/19  1638    History   Chief Complaint Chief Complaint  Patient presents with  . Shortness of Breath    HPI Crystal [name] is a 85 y.o. female.     85 year old female with prior medical history as detailed below presents for evaluation of shortness of breath.  Patient with gradually worsening shortness of breath over the last 2 days.  Patient denies fever.  Patient denies chest pain.  Patient is visibly short of breath and uncomfortable at time of my initial exam.  Level 5 caveat secondary to her trouble breathing.  The history is provided by the patient and medical records.  Shortness of Breath  Severity:  Moderate Onset quality:  Gradual Duration:  2 days Timing:  Constant Progression:  Worsening Chronicity:  Recurrent Relieved by:  Nothing Worsened by:  Nothing Ineffective treatments:  None tried Associated symptoms: no chest pain and no fever     Past Medical History:  Diagnosis Date  . Anemia 08/21/2017  . Anxiety   . Cardiomyopathy    non ischemic NL cos on cath 08/2009, EF to 15-20%, Her follow up  2D echo  in 10/2009 showed impoved EFo 35-40%  . CHF (congestive heart failure) ([place])   . Depression   . Full dentures   . Hypercalcemia 04/20/2017  . Hypertension   . Hypothyroid   . Lung mass   . Medicare annual wellness visit, subsequent 12/14/2014   Follows with Dr Marin Olp Follows with Dr Deatra Ina, gastroenterology, last colonoscopy in 2012, repeat in 2017 Last Pap 2011, always normal, no need for repeat Last MGM in 2011, no concerns, declines for now Follows with Dr Stanford Breed of cardiology   . Non Hodgkin's lymphoma ([place]) 11/17/1999   -Dr. Marin Olp  . Vitamin D deficiency 10/17/2015  . Wears glasses     Patient Active Problem List   Diagnosis Date Noted  . Acute on chronic respiratory failure with hypoxemia ([place]) 01/12/2019  . CKD (chronic kidney  disease) stage 3, GFR 30-59 ml/min (HCC) 11/30/2018  . Acute on chronic systolic CHF (congestive heart failure), NYHA class 4 ([place]) 11/29/2018  . Cavitating mass in left upper lung lobe 06/29/2018  . Hilar adenopathy 06/29/2018  . Abnormal CT of the chest 06/22/2018  . IBS (irritable bowel syndrome) 02/21/2018  . Anemia 08/21/2017  . Hypercalcemia 04/20/2017  . Diffuse large B-cell lymphoma of lymph nodes of neck ([place]) 01/21/2017  . Mitral valve regurgitation, Moderate to severe 11/26/2015  . Chronic systolic heart failure ([place]) 11/26/2015  . Hyperlipidemia, mixed 10/17/2015  . Vitamin D deficiency 10/17/2015  . History of angioedema 06/11/2015  . Hematuria 06/11/2015  . Preventative health care 12/14/2014  . Hyperglycemia 03/24/2012  . Diverticulosis 10/01/2011  . Thrombocytopenia ([place]) 10/02/2010  . Hypothyroidism 01/24/2010  . RHEUMATOID FACTOR, POSITIVE 01/24/2010  . Non Hodgkin's lymphoma ([place]) 01/09/2010  . RAYNAUDS SYNDROME 01/09/2010  . ANXIETY 09/20/2009  . Essential hypertension 09/20/2009  . Nonischemic cardiomyopathy ([place]) 09/20/2009  . Congestive heart failure ([place]) 09/20/2009    Past Surgical History:  Procedure Laterality Date  . DILATION AND CURETTAGE OF UTERUS    . MULTIPLE TOOTH EXTRACTIONS    . NASAL POLYP EXCISION  1962  . TONSILLECTOMY  1952  . VIDEO BRONCHOSCOPY WITH ENDOBRONCHIAL NAVIGATION Right 06/29/2018   Procedure: VIDEO BRONCHOSCOPY WITH ENDOBRONCHIAL NAVIGATION;  Surgeon: Collene Gobble, MD;  Location: [facility];  Service: Thoracic;  Laterality: Right;  Marland Kitchen VIDEO BRONCHOSCOPY WITH ENDOBRONCHIAL ULTRASOUND Right 06/29/2018   Procedure: VIDEO BRONCHOSCOPY WITH ENDOBRONCHIAL ULTRASOUND;  Surgeon: Collene Gobble, MD;  Location: [facility] OR;  Service: Thoracic;  Laterality: Right;     OB History   No obstetric history on file.      Home Medications    Prior to Admission medications   Medication Sig Start Date End Date Taking? Authorizing Provider    carvedilol (COREG) 6.25 MG tablet Take 1 tablet (6.25 mg total) by mouth 2 (two) times daily. 02/21/18  Yes Mosie Lukes, MD  furosemide (LASIX) 20 MG tablet Take 1 tablet (20 mg total) by mouth daily. 12/04/18  Yes Cherene Altes, MD  levothyroxine (SYNTHROID, LEVOTHROID) 25 MCG tablet TAKE 1 TABLET BY MOUTH ONCE DAILY BEFORE BREAKFAST Patient taking differently: Take 25 mcg by mouth daily before breakfast.  01/11/19  Yes Mosie Lukes, MD  magnesium hydroxide (MILK OF MAGNESIA) 400 MG/5ML suspension Take 15 mLs by mouth daily as needed for mild constipation.   Yes [provider]  Multiple Vitamins-Minerals (CENTRUM PO) Take 1 tablet by mouth daily.    Yes [provider]  Omega-3 Fatty Acids (FISH OIL) 1000 MG CAPS Take 1,000 mg by mouth daily.   Yes [provider]  potassium chloride SA (K-DUR,KLOR-CON) 20 MEQ tablet Take 1 tablet (20 mEq total) by mouth daily. 12/04/18  Yes Cherene Altes, MD  Probiotic Product (PROBIOTIC DAILY PO) Take 1 capsule by mouth daily.    Yes [provider]    Family History Family History  Problem Relation Age of Onset  . Diabetes Mother        deceased secondary to diabetes  . Hypertension Mother   . Vision loss Mother   . Pneumonia Father        died age 87 due to complications of pneumonia  . Colon polyps Father   . Liver disease Daughter   . Sarcoidosis Daughter   . Diabetes Sister   . Colon cancer Brother 70  . Cancer Brother   . Obesity Son   . Diabetes Brother   . Kidney disease Brother   . Diabetes Sister   . Sarcoidosis Sister   . Arthritis Sister   . Arthritis Sister   . Diabetes Sister   . Arthritis Sister   . Diabetes Sister   . Hypertension Daughter   . Hypertension Daughter   . Other Other        no obvious premature cardiovascular disease or familial cardiomyopathy is noted  . Alcohol abuse Neg Hx   . Heart disease Neg Hx     Social History Social History   Tobacco Use  .  Smoking status: Never Smoker  . Smokeless tobacco: Never Used  Substance Use Topics  . Alcohol use: Never    Alcohol/week: 0.0 standard drinks    Frequency: Never  . Drug use: Never     Allergies   Lisinopril and Iohexol   Review of Systems Review of Systems  Constitutional: Negative for fever.  Respiratory: Positive for shortness of breath.   Cardiovascular: Negative for chest pain.  All other systems reviewed and are negative.    Physical Exam Updated Vital Signs BP 106/73   Pulse 69   Temp (!) 96.9 F (36.1 C) (Rectal)   Resp 18   SpO2 100%   Physical Exam Vitals signs and nursing note reviewed.  Constitutional:      General: She is in acute  distress.     Appearance: She is well-developed.  HENT:     Head: Normocephalic and atraumatic.  Eyes:     Conjunctiva/sclera: Conjunctivae normal.     Pupils: Pupils are equal, round, and reactive to light.  Neck:     Musculoskeletal: Normal range of motion and neck supple.  Cardiovascular:     Rate and Rhythm: Normal rate and regular rhythm.     Heart sounds: Normal heart sounds.  Pulmonary:     Effort: Tachypnea, accessory muscle usage and respiratory distress present.     Breath sounds: Examination of the right-middle field reveals decreased breath sounds. Examination of the left-middle field reveals decreased breath sounds. Examination of the right-lower field reveals decreased breath sounds. Examination of the left-lower field reveals decreased breath sounds. Decreased breath sounds present.  Abdominal:     General: There is no distension.     Palpations: Abdomen is soft.     Tenderness: There is no abdominal tenderness.  Musculoskeletal: Normal range of motion.        General: No deformity.  Skin:    General: Skin is warm and dry.  Neurological:     Mental Status: She is alert and oriented to person, place, and time.      ED Treatments / Results  Labs (all labs ordered are listed, but only abnormal  results are displayed) Labs Reviewed  CBC - Abnormal; Notable for the following components:      Result Value   Hemoglobin 11.7 (*)    All other components within normal limits  COMPREHENSIVE METABOLIC PANEL - Abnormal; Notable for the following components:   Sodium 131 (*)    Chloride 97 (*)    Glucose, Bld 280 (*)    Creatinine, Ser 1.62 (*)    Total Protein 8.2 (*)    Albumin 3.0 (*)    AST 67 (*)    ALT 53 (*)    GFR calc non Af Amer 29 (*)    GFR calc Af Amer 34 (*)    All other components within normal limits  BRAIN NATRIURETIC PEPTIDE - Abnormal; Notable for the following components:   B Natriuretic Peptide 3,955.3 (*)    All other components within normal limits  LACTIC ACID, PLASMA - Abnormal; Notable for the following components:   Lactic Acid, Venous 4.1 (*)    All other components within normal limits  PROTIME-INR - Abnormal; Notable for the following components:   Prothrombin Time 15.8 (*)    INR 1.3 (*)    All other components within normal limits  D-DIMER, QUANTITATIVE (NOT AT [facility]) - Abnormal; Notable for the following components:   D-Dimer, Quant >20.00 (*)    All other components within normal limits  PROTIME-INR - Abnormal; Notable for the following components:   Prothrombin Time 16.2 (*)    INR 1.3 (*)    All other components within normal limits  POCT I-STAT 7, (LYTES, BLD GAS, ICA,H+H) - Abnormal; Notable for the following components:   pO2, Arterial 160.0 (*)    HCT 34.0 (*)    Hemoglobin 11.6 (*)    All other components within normal limits  CULTURE, BLOOD (ROUTINE X 2)  CULTURE, BLOOD (ROUTINE X 2)  LACTIC ACID, PLASMA  BLOOD GAS, ARTERIAL  I-STAT TROPONIN, ED    EKG EKG Interpretation  Date/Time:  Thursday January 12 2019 16:43:18 EST Ventricular Rate:  95 PR Interval:  192 QRS Duration: 130 QT Interval:  400 QTC Calculation: 502 R Axis:   -  53 Text Interpretation:  Normal sinus rhythm Left axis deviation Right bundle branch block  Moderate voltage criteria for LVH, may be normal variant Anterior infarct , age undetermined T wave abnormality, consider lateral ischemia Abnormal ECG Confirmed by Dene Gentry [phone]) on 01/12/2019 5:03:57 PM   Radiology Dg Chest Port 1 View  Result Date: 01/12/2019 CLINICAL DATA:  Shortness of breath EXAM: PORTABLE CHEST 1 VIEW COMPARISON:  11/29/2018 FINDINGS: Cardiomegaly. Perihilar and lower lobe airspace opacities are noted. Suspect small left effusion. No acute bony abnormality. IMPRESSION: Perihilar opacities and bibasilar opacities, left greater than right. This could reflect atelectasis or infiltrates. Cardiomegaly, vascular congestion Electronically Signed   By: Rolm Baptise M.D.   On: 01/12/2019 17:47    Procedures Procedures (including critical care time) CRITICAL CARE Performed by: Valarie Merino   Total critical care time: 45 minutes  Critical care time was exclusive of separately billable procedures and treating other patients.  Critical care was necessary to treat or prevent imminent or life-threatening deterioration.  Critical care was time spent personally by me on the following activities: development of treatment plan with patient and/or surrogate as well as nursing, discussions with consultants, evaluation of patient's response to treatment, examination of patient, obtaining history from patient or surrogate, ordering and performing treatments and interventions, ordering and review of laboratory studies, ordering and review of radiographic studies, pulse oximetry and re-evaluation of patient's condition.   Medications Ordered in ED Medications  sodium chloride flush (NS) 0.9 % injection 3 mL (3 mLs Intravenous Not Given 01/12/19 1756)  ceFEPIme (MAXIPIME) 1 g in sodium chloride 0.9 % 100 mL IVPB (has no administration in time range)  vancomycin (VANCOCIN) IVPB 1000 mg/200 mL premix (has no administration in time range)  albuterol (PROVENTIL) (2.5 MG/3ML) 0.083%  nebulizer solution 5 mg (5 mg Nebulization Given 01/12/19 1645)  furosemide (LASIX) injection 20 mg (20 mg Intravenous Given 01/12/19 1711)  ceFEPIme (MAXIPIME) 2 g in sodium chloride 0.9 % 100 mL IVPB (0 g Intravenous Stopped 01/12/19 1816)  vancomycin (VANCOCIN) 1,250 mg in sodium chloride 0.9 % 250 mL IVPB (0 mg Intravenous Stopped 01/12/19 1951)  sodium chloride 0.9 % bolus 500 mL (0 mLs Intravenous Stopped 01/12/19 1817)  0.9 %  sodium chloride infusion ( Intravenous New Bag/Given 01/12/19 1942)     Initial Impression / Assessment and Plan / ED Course  I have reviewed the triage vital signs and the nursing notes.  Pertinent labs & imaging results that were available during my care of the patient were reviewed by me and considered in my medical decision making (see chart for details).        MDM  Screen complete  Patient is presenting for evaluation of shortness of breath.  Patient is visibly in distress upon arrival.  Patient placed immediately on BiPAP.  This appears to have improved her situation significantly.  Work-up reveals a likely evidence of CHF.  Patient was given broad-spectrum antibiotics upon initial arrival given her presentation.  Of note, patient with noncontrast CT scan performed 2 days previously which did show significant pulmonary edema and possible early infection.  D-dimer was ordered initially given a broad differential.  However upon reflection of her history and other lab values it seems very unlikely that the patient is suffering from a PE.  Patient will require admission for diuresis and further treatment and work-up.   Hospitalist service is aware of case and will evaluate for admission.  Final Clinical Impressions(s) / ED Diagnoses  Final diagnoses:  Shortness of breath    ED Discharge Orders    None       Valarie Merino, MD 01/12/19 2204

## 2019-01-13 ENCOUNTER — Other Ambulatory Visit: Payer: Self-pay

## 2019-01-13 DIAGNOSIS — N183 Chronic kidney disease, stage 3 (moderate): Secondary | ICD-10-CM

## 2019-01-13 DIAGNOSIS — J984 Other disorders of lung: Secondary | ICD-10-CM

## 2019-01-13 DIAGNOSIS — I428 Other cardiomyopathies: Secondary | ICD-10-CM

## 2019-01-13 DIAGNOSIS — E039 Hypothyroidism, unspecified: Secondary | ICD-10-CM

## 2019-01-13 DIAGNOSIS — I10 Essential (primary) hypertension: Secondary | ICD-10-CM

## 2019-01-13 LAB — CBC WITH DIFFERENTIAL/PLATELET
BASOS ABS: 0 10*3/uL (ref 0.0–0.1)
Band Neutrophils: 0 %
Basophils Relative: 0 %
Blasts: 0 %
Eosinophils Absolute: 0 10*3/uL (ref 0.0–0.5)
Eosinophils Relative: 0 %
HCT: 36.3 % (ref 36.0–46.0)
HEMOGLOBIN: 11.5 g/dL — AB (ref 12.0–15.0)
LYMPHS ABS: 0.8 10*3/uL (ref 0.7–4.0)
Lymphocytes Relative: 19 %
MCH: 28.2 pg (ref 26.0–34.0)
MCHC: 31.7 g/dL (ref 30.0–36.0)
MCV: 89 fL (ref 80.0–100.0)
Metamyelocytes Relative: 0 %
Monocytes Absolute: 0.3 10*3/uL (ref 0.1–1.0)
Monocytes Relative: 7 %
Myelocytes: 0 %
Neutro Abs: 3.2 10*3/uL (ref 1.7–7.7)
Neutrophils Relative %: 74 %
Other: 0 %
Platelets: 106 10*3/uL — ABNORMAL LOW (ref 150–400)
Promyelocytes Relative: 0 %
RBC: 4.08 MIL/uL (ref 3.87–5.11)
RDW: 13.6 % (ref 11.5–15.5)
WBC: 4.3 10*3/uL (ref 4.0–10.5)
nRBC: 0 % (ref 0.0–0.2)
nRBC: 0 /100 WBC

## 2019-01-13 LAB — BASIC METABOLIC PANEL
Anion gap: 12 (ref 5–15)
BUN: 23 mg/dL (ref 8–23)
CO2: 21 mmol/L — ABNORMAL LOW (ref 22–32)
Calcium: 9.2 mg/dL (ref 8.9–10.3)
Chloride: 104 mmol/L (ref 98–111)
Creatinine, Ser: 1.38 mg/dL — ABNORMAL HIGH (ref 0.44–1.00)
GFR calc non Af Amer: 35 mL/min — ABNORMAL LOW (ref >60)
GFR, EST AFRICAN AMERICAN: 41 mL/min — AB (ref >60)
Glucose, Bld: 88 mg/dL (ref 70–99)
Potassium: 4 mmol/L (ref 3.5–5.1)
Sodium: 137 mmol/L (ref 135–145)

## 2019-01-13 LAB — MRSA PCR SCREENING: MRSA by PCR: NEGATIVE

## 2019-01-13 MED ORDER — FUROSEMIDE 10 MG/ML IJ SOLN
60.0000 mg | Freq: Two times a day (BID) | INTRAMUSCULAR | Status: DC
  Administered 2019-01-13: 60 mg via INTRAVENOUS
  Filled 2019-01-13: qty 6

## 2019-01-13 NOTE — Care Management Note (Addendum)
Case Management Note  Patient Details  Name: Crystal [name] MRN: [id number] Date of Birth: [birth year]  Subjective/Objective:  From home with spouse, NCM offered choice for [facility], for CHF disease management and HHPT, she states she wants to think about it.  NCM left Medicare.gov list with patient.    2/29 Tomi Bamberger RN, BSN - NCM checked back with patient today regarding [facility] for disease management of CHF and HHPT, [place] she states she does not want [facility] services at this time. She states she is ready to get back in bed , NCM informed Acupuncturist of this information.                Action/Plan: NCM will follow for transition of care needs.   Expected Discharge Date:                  Expected Discharge Plan:  [place]  In-House Referral:     Discharge planning Services  CM Consult  Post Acute Care Choice:  Home Health Choice offered to:  Patient  DME Arranged:    DME Agency:     HH Arranged:   Patient refused HH, PT, RN,OT [place] Agency:     Status of Service:  In process, will continue to follow  If discussed at Long Length of Stay Meetings, dates discussed:    Additional Comments:  Zenon Mayo, RN 01/13/2019, 4:47 PM

## 2019-01-13 NOTE — Progress Notes (Signed)
OT Cancellation Note  Patient Details Name: HENSLEE LOTTMAN MRN: [id number] DOB: 1934-01-08   Cancelled Treatment:    Reason Eval/Treat Not Completed: Other (comment)(Pt on continuous bipap.). Per discussion with nurse pt on continuous bipap. Plan to reattempt tomorrow.   Tyrone Schimke, OT Acute Rehabilitation Services Pager: [phone] Office: [phone]  01/13/2019, 11:28 AM

## 2019-01-13 NOTE — Progress Notes (Signed)
PROGRESS NOTE    Crystal [name]  NAT:[id number] DOB: January 30, 1934 DOA: 01/12/2019 PCP: Mosie Lukes, MD    Brief Narrative:  85 year old female who presented with dyspnea.  She is known to have systolic heart failure ejection fraction 35 to 40%, CABG 30 lung mass in the left apical lobe, chronic disease stage III, and non-Hodgkin's lymphoma.  Patient has developed progressive dyspnea for the last 2 days, she had a recent hospitalization where she was treated for volume overload related to heart failure and right upper lobe pneumonia.  On her initial physical examination she was found in respiratory distress, hypoxic down to 60% oxygen saturation, she was placed on noninvasive mechanical ventilation.  Her blood pressure was 109/66, heart rate 80, respiratory rate 32, oxygen saturation 99% on BiPAP.  She had accessory muscle use, decreased air entry bilaterally with marked expiratory wheezing and rales, heart S1-S2 present, tachycardic, abdomen soft, positive lower extremity edema.  Her chest radiograph showed right upper lobe infiltrate, increased vascular congestion and cardiomegaly.  Patient was admitted to the hospital working diagnosis of acute on chronic hypoxic respiratory failure.  Assessment & Plan:   Principal Problem:   Acute on chronic respiratory failure with hypoxemia (HCC) Active Problems:   Non Hodgkin's lymphoma (HCC)   Hypothyroidism   Essential hypertension   Nonischemic cardiomyopathy (HCC)   Cavitating mass in left upper lung lobe   Acute on chronic systolic CHF (congestive heart failure), NYHA class 4 (HCC)   CKD (chronic kidney disease) stage 3, GFR 30-59 ml/min ([place])   1.  Acute on chronic hypoxic respiratory failure, due to acute on chronic systolic heart failure exacerbation. This am patient continue to have increase work of breathing and accessory muscle use, improved with diuresis, (1lt), will continue diuresis and wean off Bipap as tolerated during the day. Will  plan to continue Bipap at night. Right upper lobe infiltrate, recent pneumonia. Will hold on antibiotic therapy for now.   2. Left cavitary apical lung lesion. Multiple lung nodules, biopsy per bronch negative for malignancy. Will need outpatient follow up. No positive microbiology. Will discuss with pulmonary possible MAC.   3. HTN. Continue blood pressure monitoring  4. CKD stage 3. Will continue to follow renal panel in am, avoid hypotension and nephrotoxic medications. Serum cr at 1,38 with K at 4.0 and serum bicarbonate at 21.   5. Non Hodgkin's Lymphoma. Continue follow up as outpatient.   6. Hypothyroid. Continue levothyroxine.   DVT prophylaxis: enoxaparin   Code Status:  full Family Communication: I spoke with patient's family at the bedside and all questions were addressed.  Disposition Plan/ discharge barriers: pending clinical improvement  Body mass index is 24.95 kg/m. Malnutrition Type:      Malnutrition Characteristics:      Nutrition Interventions:     RN Pressure Injury Documentation:     Consultants:     Procedures:     Antimicrobials:       Subjective: Patient thi am with severe dyspnea and increased work of breathing on full face mask, improved after 1 liter diuresis. No chest pain, no nausea or vomiting.   Objective: Vitals:   01/13/19 0816 01/13/19 0835 01/13/19 1209 01/13/19 1320  BP:   105/77   Pulse: 88 92 87 89  Resp: (!) 22 (!) 27 (!) 27 18  Temp:   98 F (36.7 C)   TempSrc:   Axillary   SpO2: 99% 99% 100% 95%  Weight:      Height:  Intake/Output Summary (Last 24 hours) at 01/13/2019 1437 Last data filed at 01/13/2019 1100 Gross per 24 hour  Intake 1842.37 ml  Output 900 ml  Net 942.37 ml   Filed Weights   01/12/19 2322 01/13/19 0644  Weight: 62.2 kg 63.9 kg    Examination:   General: deconditioned and ill looking appearing Neurology: Awake and alert, non focal  E ENT: no pallor, no icterus, oral mucosa  moist Cardiovascular: Moderate JVD. S1-S2 present, rhythmic, no gallops, rubs, or murmurs. ++ bilateral lower extremity edema. Pulmonary: positive breath sounds bilaterally, positive air movement, no wheezing, rhonchi, scattered bilateral rales. Gastrointestinal. Abdomen with no organomegaly, non tender, no rebound or guarding Skin. No rashes Musculoskeletal: no joint deformities     Data Reviewed: I have personally reviewed following labs and imaging studies  CBC: Recent Labs  Lab 01/10/19 0907 01/12/19 1659 01/12/19 1759 01/13/19 0309  WBC 4.2 5.7  --  4.3  NEUTROABS 1.9  --   --  3.2  HGB 12.5 11.7* 11.6* 11.5*  HCT 39.4 38.2 34.0* 36.3  MCV 89.7 91.4  --  89.0  PLT 159 156  --  440*   Basic Metabolic Panel: Recent Labs  Lab 01/10/19 0907 01/12/19 1701 01/12/19 1759 01/13/19 0309  NA 140 131* 135 137  K 4.3 4.1 3.6 4.0  CL 103 97*  --  104  CO2 28 22  --  21*  GLUCOSE 138* 280*  --  88  BUN 30* 23  --  23  CREATININE 1.49* 1.62*  --  1.38*  CALCIUM 10.3 9.2  --  9.2   GFR: Estimated Creatinine Clearance: 27.8 mL/min (A) (by C-G formula based on SCr of 1.38 mg/dL (H)). Liver Function Tests: Recent Labs  Lab 01/10/19 0907 01/12/19 1701  AST 29 67*  ALT 25 53*  ALKPHOS 113 103  BILITOT 0.7 0.9  PROT 8.2* 8.2*  ALBUMIN 3.7 3.0*   No results for input(s): LIPASE, AMYLASE in the last 168 hours. No results for input(s): AMMONIA in the last 168 hours. Coagulation Profile: Recent Labs  Lab 01/12/19 1702 01/12/19 1830  INR 1.3* 1.3*   Cardiac Enzymes: No results for input(s): CKTOTAL, CKMB, CKMBINDEX, TROPONINI in the last 168 hours. BNP (last 3 results) No results for input(s): PROBNP in the last 8760 hours. HbA1C: No results for input(s): HGBA1C in the last 72 hours. CBG: No results for input(s): GLUCAP in the last 168 hours. Lipid Profile: No results for input(s): CHOL, HDL, LDLCALC, TRIG, CHOLHDL, LDLDIRECT in the last 72 hours. Thyroid Function  Tests: No results for input(s): TSH, T4TOTAL, FREET4, T3FREE, THYROIDAB in the last 72 hours. Anemia Panel: No results for input(s): VITAMINB12, FOLATE, FERRITIN, TIBC, IRON, RETICCTPCT in the last 72 hours.    Radiology Studies: I have reviewed all of the imaging during this hospital visit personally     Scheduled Meds: . carvedilol  6.25 mg Oral BID WC  . furosemide  40 mg Intravenous BID  . heparin  5,000 Units Subcutaneous Q8H  . levothyroxine  25 mcg Oral Q0600  . sodium chloride flush  3 mL Intravenous Once  . sodium chloride flush  3 mL Intravenous Q12H   Continuous Infusions: . sodium chloride    . ceFEPime (MAXIPIME) IV    . [START ON 01/14/2019] vancomycin       LOS: 1 day        Addilynne Olheiser Gerome Apley, MD

## 2019-01-13 NOTE — Telephone Encounter (Signed)
Patient currently admitted

## 2019-01-13 NOTE — Telephone Encounter (Signed)
Patient is currently admitted. See telephone note 01/12/19.

## 2019-01-13 NOTE — Evaluation (Signed)
Physical Therapy Evaluation Patient Details Name: Crystal [name] MRN: [id number] DOB: [birth year] Today's Date: 01/13/2019   History of Present Illness  Crystal [name] is a 85 y.o. 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 admitted with acute onset of resp failure with hypoxmia     Clinical Impression  Pt admitted with above diagnosis. Pt currently with functional limitations due to the deficits listed below (see PT Problem List). PTA, pt living at home with husband, independent with mobility, avid hiker. No longer drives due to medication. Today patient just off bipap, poor tolerance standing with SOB, returned to bed. SpO2 WNL, HR 95-110 with several missed beats noted on monitor. Pt comfortable at end of visit, looks forward to working with PT again next session.  Pt will benefit from skilled PT to increase their independence and safety with mobility to allow discharge to the venue listed below.       Follow Up Recommendations Home health PT 24 hour supervision     Equipment Recommendations  None recommended by PT    Recommendations for Other Services OT consult     Precautions / Restrictions Precautions Precautions: Fall Precaution Comments: Watch O2 Restrictions Weight Bearing Restrictions: No      Mobility  Bed Mobility Overal bed mobility: Modified Independent                Transfers Overall transfer level: Needs assistance Equipment used: 1 person hand held assist Transfers: Sit to/from Stand Sit to Stand: Min guard;Min assist         General transfer comment: light hand held assist for balance as patient stands. pt has increased diffculty breathing returns seated, SpO2 WNL, HR 100-115. she reports improvement with rest.    Ambulation/Gait             General Gait Details: deferred to tolerance today  Stairs            Wheelchair Mobility    Modified Rankin (Stroke Patients Only)       Balance Overall balance assessment: Mild deficits observed, not formally tested                                           Pertinent Vitals/Pain Pain Assessment: No/denies pain    Home Living Family/patient expects to be discharged to:: Private residence Living Arrangements: Spouse/significant other Available Help at Discharge: Family;Available 24 hours/day Type of Home: House Home Access: Stairs to enter Entrance Stairs-Rails: Right Entrance Stairs-Number of Steps: 3 Home Layout: One level Home Equipment: None      Prior Function Level of Independence: Independent         Comments: I without Ad, enjoys hiking and is part of a hiking club      Hand Dominance        Extremity/Trunk Assessment   Upper Extremity Assessment Upper Extremity Assessment: Overall WFL for tasks assessed    Lower Extremity Assessment Lower Extremity Assessment: Overall WFL for tasks assessed    Cervical / Trunk Assessment Cervical / Trunk Assessment: Normal  Communication   Communication: No difficulties  Cognition Arousal/Alertness: Awake/alert Behavior During Therapy: WFL for tasks assessed/performed Overall Cognitive Status: Within Functional Limits  for tasks assessed                                        General Comments      Exercises     Assessment/Plan    PT Assessment Patient needs continued PT services  PT Problem List Decreased strength       PT Treatment Interventions Gait training;Stair training;DME instruction;Functional mobility training;Therapeutic activities;Therapeutic exercise;Balance training    PT Goals (Current goals can be found in the Care Plan section)  Acute Rehab PT Goals Patient Stated Goal: go home when feeling better PT  Goal Formulation: With patient Time For Goal Achievement: 01/27/19 Potential to Achieve Goals: Fair    Frequency Min 3X/week   Barriers to discharge        Co-evaluation               AM-PAC PT "6 Clicks" Mobility  Outcome Measure Help needed turning from your back to your side while in a flat bed without using bedrails?: None Help needed moving from lying on your back to sitting on the side of a flat bed without using bedrails?: None Help needed moving to and from a bed to a chair (including a wheelchair)?: A Little Help needed standing up from a chair using your arms (e.g., wheelchair or bedside chair)?: A Little Help needed to walk in hospital room?: A Lot Help needed climbing 3-5 steps with a railing? : A Lot 6 Click Score: 18    End of Session   Activity Tolerance: Patient limited by fatigue Patient left: in bed;with call bell/phone within reach Nurse Communication: Mobility status PT Visit Diagnosis: Unsteadiness on feet (R26.81)    Time: 1345-1415 PT Time Calculation (min) (ACUTE ONLY): 30 min   Charges:   PT Evaluation $PT Eval Low Complexity: 1 Low PT Treatments $Therapeutic Activity: 8-22 mins      Reinaldo Berber, PT, DPT Acute Rehabilitation Services Pager: [phone] Office: [phone]    Reinaldo Berber 01/13/2019, 2:39 PM

## 2019-01-14 ENCOUNTER — Inpatient Hospital Stay (HOSPITAL_COMMUNITY): Payer: Medicare Other

## 2019-01-14 DIAGNOSIS — C8331 Diffuse large B-cell lymphoma, lymph nodes of head, face, and neck: Secondary | ICD-10-CM

## 2019-01-14 LAB — BASIC METABOLIC PANEL
Anion gap: 10 (ref 5–15)
BUN: 21 mg/dL (ref 8–23)
CO2: 27 mmol/L (ref 22–32)
Calcium: 9.4 mg/dL (ref 8.9–10.3)
Chloride: 102 mmol/L (ref 98–111)
Creatinine, Ser: 1.52 mg/dL — ABNORMAL HIGH (ref 0.44–1.00)
GFR calc non Af Amer: 31 mL/min — ABNORMAL LOW (ref >60)
GFR, EST AFRICAN AMERICAN: 36 mL/min — AB (ref >60)
Glucose, Bld: 95 mg/dL (ref 70–99)
POTASSIUM: 3.1 mmol/L — AB (ref 3.5–5.1)
Sodium: 139 mmol/L (ref 135–145)

## 2019-01-14 MED ORDER — POTASSIUM CHLORIDE CRYS ER 20 MEQ PO TBCR
40.0000 meq | EXTENDED_RELEASE_TABLET | Freq: Once | ORAL | Status: AC
  Administered 2019-01-14: 40 meq via ORAL

## 2019-01-14 NOTE — Progress Notes (Signed)
PROGRESS NOTE    Crystal [name]  NFA:[id number] DOB: 03-26-1934 DOA: 01/12/2019 PCP: Mosie Lukes, MD    Brief Narrative:  85 year old female who presented with dyspnea.  She is known to have systolic heart failure ejection fraction 35 to 40%, CABG 30 lung mass in the left apical lobe, chronic disease stage III, and non-Hodgkin's lymphoma.  Patient has developed progressive dyspnea for the last 2 days, she had a recent hospitalization where she was treated for volume overload related to heart failure and right upper lobe pneumonia.  On her initial physical examination she was found in respiratory distress, hypoxic down to 60% oxygen saturation, she was placed on noninvasive mechanical ventilation.  Her blood pressure was 109/66, heart rate 80, respiratory rate 32, oxygen saturation 99% on BiPAP.  She had accessory muscle use, decreased air entry bilaterally with marked expiratory wheezing and rales, heart S1-S2 present, tachycardic, abdomen soft, positive lower extremity edema.  Her chest radiograph showed right upper lobe infiltrate, increased vascular congestion and cardiomegaly.  Patient was admitted to the hospital working diagnosis of acute on chronic hypoxic respiratory failure.   Assessment & Plan:   Principal Problem:   Acute on chronic respiratory failure with hypoxemia (HCC) Active Problems:   Non Hodgkin's lymphoma (HCC)   Hypothyroidism   Essential hypertension   Nonischemic cardiomyopathy (HCC)   Cavitating mass in left upper lung lobe   Acute on chronic systolic CHF (congestive heart failure), NYHA class 4 (HCC)   CKD (chronic kidney disease) stage 3, GFR 30-59 ml/min ([place])  1.  Acute on chronic hypoxic respiratory failure, due to acute on chronic systolic heart failure exacerbation. Patient has responded to diuresis well, this am is on nasal cannula, will continue oxygen monitoring and keep negative fluid balance. Last 24 urine out put 1,550 ml. Continue carvedilol  2.  Left cavitary apical lung lesion. Multiple lung nodules, biopsy per bronch negative for malignancy. Patient will need outpatient follow up, need to rule out chronic infections like MAC, will get pulmonary recommendations on this admission.   3. HTN. Blood pressure has remained stable.   4. AKI on CKD stage 3 with hypokalemia. Worsening renal function with serum cr at 1,52, possible over-diuresis, will hold furosemide today and will follow on renal panel in am, avoid hypotension or nephrotoxic medications. Will continue K correction with oral Kcl.   5. Non Hodgkin's Lymphoma. Follow as outpatient, has been in remission.   6. Hypothyroid. On levothyroxine.   DVT prophylaxis: enoxaparin   Code Status:  full Family Communication: I spoke with patient's husband at the bedside and all questions were addressed.  Disposition Plan/ discharge barriers: pending clinical improvement  Body mass index is 24.88 kg/m. Malnutrition Type:      Malnutrition Characteristics:      Nutrition Interventions:     RN Pressure Injury Documentation:     Consultants:     Procedures:     Antimicrobials:       Subjective: Patient is feeling better, but not yet back to baseline, continue to have dyspnea, but no chest pain, this am is off Bipap. No nausea or vomiting.   Objective: Vitals:   01/14/19 0600 01/14/19 0701 01/14/19 0800 01/14/19 0832  BP:    128/82  Pulse:  85 83 87  Resp:  (!) 26 (!) 27 (!) 27  Temp:    (!) 97.4 F (36.3 C)  TempSrc:    Oral  SpO2:  99% 100% 100%  Weight: 63.7 kg  Height:        Intake/Output Summary (Last 24 hours) at 01/14/2019 0918 Last data filed at 01/14/2019 0500 Gross per 24 hour  Intake -  Output 1550 ml  Net -1550 ml   Filed Weights   01/13/19 0644 01/14/19 0500 01/14/19 0600  Weight: 63.9 kg 64.1 kg 63.7 kg    Examination:   General: Not in pain or dyspnea, deconditioned  Neurology: Awake and alert, non focal  E ENT: mild  pallor, no icterus, oral mucosa moist Cardiovascular: No JVD. S1-S2 present, rhythmic, no gallops, rubs, or murmurs. No lower extremity edema. Pulmonary: decreased breath sounds bilaterally, decreased air movement, no wheezing, or rhonchi, positive right upper lobe rales. Gastrointestinal. Abdomen with, no organomegaly, non tender, no rebound or guarding Skin. No rashes Musculoskeletal: no joint deformities     Data Reviewed: I have personally reviewed following labs and imaging studies  CBC: Recent Labs  Lab 01/10/19 0907 01/12/19 1659 01/12/19 1759 01/13/19 0309  WBC 4.2 5.7  --  4.3  NEUTROABS 1.9  --   --  3.2  HGB 12.5 11.7* 11.6* 11.5*  HCT 39.4 38.2 34.0* 36.3  MCV 89.7 91.4  --  89.0  PLT 159 156  --  466*   Basic Metabolic Panel: Recent Labs  Lab 01/10/19 0907 01/12/19 1701 01/12/19 1759 01/13/19 0309 01/14/19 0308  NA 140 131* 135 137 139  K 4.3 4.1 3.6 4.0 3.1*  CL 103 97*  --  104 102  CO2 28 22  --  21* 27  GLUCOSE 138* 280*  --  88 95  BUN 30* 23  --  23 21  CREATININE 1.49* 1.62*  --  1.38* 1.52*  CALCIUM 10.3 9.2  --  9.2 9.4   GFR: Estimated Creatinine Clearance: 25.2 mL/min (A) (by C-G formula based on SCr of 1.52 mg/dL (H)). Liver Function Tests: Recent Labs  Lab 01/10/19 0907 01/12/19 1701  AST 29 67*  ALT 25 53*  ALKPHOS 113 103  BILITOT 0.7 0.9  PROT 8.2* 8.2*  ALBUMIN 3.7 3.0*   No results for input(s): LIPASE, AMYLASE in the last 168 hours. No results for input(s): AMMONIA in the last 168 hours. Coagulation Profile: Recent Labs  Lab 01/12/19 1702 01/12/19 1830  INR 1.3* 1.3*   Cardiac Enzymes: No results for input(s): CKTOTAL, CKMB, CKMBINDEX, TROPONINI in the last 168 hours. BNP (last 3 results) No results for input(s): PROBNP in the last 8760 hours. HbA1C: No results for input(s): HGBA1C in the last 72 hours. CBG: No results for input(s): GLUCAP in the last 168 hours. Lipid Profile: No results for input(s): CHOL,  HDL, LDLCALC, TRIG, CHOLHDL, LDLDIRECT in the last 72 hours. Thyroid Function Tests: No results for input(s): TSH, T4TOTAL, FREET4, T3FREE, THYROIDAB in the last 72 hours. Anemia Panel: No results for input(s): VITAMINB12, FOLATE, FERRITIN, TIBC, IRON, RETICCTPCT in the last 72 hours.    Radiology Studies: I have reviewed all of the imaging during this hospital visit personally     Scheduled Meds: . carvedilol  6.25 mg Oral BID WC  . heparin  5,000 Units Subcutaneous Q8H  . levothyroxine  25 mcg Oral Q0600  . sodium chloride flush  3 mL Intravenous Once  . sodium chloride flush  3 mL Intravenous Q12H   Continuous Infusions: . sodium chloride       LOS: 2 days        Tesa Meadors Gerome Apley, MD

## 2019-01-14 NOTE — Progress Notes (Signed)
Physical Therapy Treatment Patient Details Name: Crystal [name] MRN: [id number] DOB: 1934-03-27 Today's Date: 01/14/2019    History of Present Illness PARMINDER TRAPANI is a 85 y.o. 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 admitted with acute onset of resp failure with hypoxmia     PT Comments    Pt making good progress. O2 needs borderline with SpO2 88-90% amb on RA.    Follow Up Recommendations  Home health PT     Equipment Recommendations  None recommended by PT    Recommendations for Other Services       Precautions / Restrictions Precautions Precautions: Fall Precaution Comments: Watch O2 Restrictions Weight Bearing Restrictions: No    Mobility  Bed Mobility Overal bed mobility: Modified Independent                Transfers Overall transfer level: Needs assistance Equipment used: None Transfers: Sit to/from Stand Sit to Stand: Supervision         General transfer comment: assist for safety and lines  Ambulation/Gait Ambulation/Gait assistance: Supervision;Min guard Gait Distance (Feet): 200 Feet Assistive device: None;4-wheeled walker Gait Pattern/deviations: Step-through pattern;Decreased stride length Gait velocity: decr Gait velocity interpretation: 1.31 - 2.62 ft/sec, indicative of limited community ambulator General Gait Details: Supervision when using rollator and min guard without assitive device. Amb on RA with SpO2 88-90%   Stairs             Wheelchair Mobility    Modified Rankin (Stroke Patients Only)       Balance Overall balance assessment: Mild deficits observed, not formally tested                                          Cognition Arousal/Alertness:  Awake/alert Behavior During Therapy: WFL for tasks assessed/performed Overall Cognitive Status: Within Functional Limits for tasks assessed                                        Exercises      General Comments        Pertinent Vitals/Pain      Home Living                      Prior Function            PT Goals (current goals can now be found in the care plan section) Progress towards PT goals: Progressing toward goals;Goals met and updated - see care plan    Frequency    Min 3X/week      PT Plan      Co-evaluation              AM-PAC PT "6 Clicks" Mobility   Outcome Measure  Help needed turning from your back to your side while in a flat bed without using bedrails?: None Help needed moving from lying on your back to sitting on the side of a flat bed without using bedrails?: None Help needed moving to and from a bed to a chair (including a wheelchair)?: A Little Help needed standing up from a  chair using your arms (e.g., wheelchair or bedside chair)?: A Little Help needed to walk in hospital room?: A Little Help needed climbing 3-5 steps with a railing? : A Little 6 Click Score: 20    End of Session   Activity Tolerance: Patient tolerated treatment well Patient left: with call bell/phone within reach;in chair;with family/visitor present   PT Visit Diagnosis: Unsteadiness on feet (R26.81)     Time: 6301-6010 PT Time Calculation (min) (ACUTE ONLY): 28 min  Charges:  $Gait Training: 23-37 mins                     [place] Pager [phone] Office [place] 01/14/2019, 1:33 PM

## 2019-01-15 ENCOUNTER — Inpatient Hospital Stay (HOSPITAL_COMMUNITY): Payer: Medicare Other

## 2019-01-15 DIAGNOSIS — I5023 Acute on chronic systolic (congestive) heart failure: Secondary | ICD-10-CM

## 2019-01-15 DIAGNOSIS — N179 Acute kidney failure, unspecified: Secondary | ICD-10-CM

## 2019-01-15 LAB — BLOOD GAS, ARTERIAL
Acid-Base Excess: 1.4 mmol/L (ref 0.0–2.0)
Bicarbonate: 25.9 mmol/L (ref 20.0–28.0)
Drawn by: 330991
O2 Content: 3 L/min
O2 Saturation: 99.4 %
Patient temperature: 97.2
pCO2 arterial: 43.1 mmHg (ref 32.0–48.0)
pH, Arterial: 7.392 (ref 7.350–7.450)
pO2, Arterial: 184 mmHg — ABNORMAL HIGH (ref 83.0–108.0)

## 2019-01-15 LAB — BASIC METABOLIC PANEL
Anion gap: 7 (ref 5–15)
BUN: 27 mg/dL — ABNORMAL HIGH (ref 8–23)
CO2: 25 mmol/L (ref 22–32)
Calcium: 9.6 mg/dL (ref 8.9–10.3)
Chloride: 104 mmol/L (ref 98–111)
Creatinine, Ser: 1.59 mg/dL — ABNORMAL HIGH (ref 0.44–1.00)
GFR calc Af Amer: 34 mL/min — ABNORMAL LOW (ref >60)
GFR calc non Af Amer: 30 mL/min — ABNORMAL LOW (ref >60)
Glucose, Bld: 131 mg/dL — ABNORMAL HIGH (ref 70–99)
Potassium: 4.2 mmol/L (ref 3.5–5.1)
Sodium: 136 mmol/L (ref 135–145)

## 2019-01-15 LAB — MAGNESIUM: Magnesium: 1.9 mg/dL (ref 1.7–2.4)

## 2019-01-15 MED ORDER — LORAZEPAM 2 MG/ML IJ SOLN
INTRAMUSCULAR | Status: AC
  Administered 2019-01-15: 0.5 mg via INTRAVENOUS
  Filled 2019-01-15: qty 1

## 2019-01-15 MED ORDER — ALBUTEROL SULFATE (2.5 MG/3ML) 0.083% IN NEBU
2.5000 mg | INHALATION_SOLUTION | Freq: Four times a day (QID) | RESPIRATORY_TRACT | Status: DC | PRN
  Administered 2019-01-15: 2.5 mg via RESPIRATORY_TRACT
  Filled 2019-01-15: qty 3

## 2019-01-15 MED ORDER — FUROSEMIDE 10 MG/ML IJ SOLN
INTRAMUSCULAR | Status: AC
  Administered 2019-01-15: 40 mg via INTRAVENOUS
  Filled 2019-01-15: qty 4

## 2019-01-15 MED ORDER — FUROSEMIDE 10 MG/ML IJ SOLN
40.0000 mg | Freq: Once | INTRAMUSCULAR | Status: AC
  Administered 2019-01-15: 40 mg via INTRAVENOUS

## 2019-01-15 MED ORDER — ALPRAZOLAM 0.25 MG PO TABS
0.2500 mg | ORAL_TABLET | Freq: Once | ORAL | Status: AC
  Administered 2019-01-15: 0.25 mg via ORAL
  Filled 2019-01-15: qty 1

## 2019-01-15 MED ORDER — LORAZEPAM 2 MG/ML IJ SOLN
0.5000 mg | Freq: Once | INTRAMUSCULAR | Status: AC
  Administered 2019-01-15: 0.5 mg via INTRAVENOUS

## 2019-01-15 NOTE — Progress Notes (Signed)
PROGRESS NOTE    Crystal [name]  ZJI:[id number] DOB: 02/28/1934 DOA: 01/12/2019 PCP: Mosie Lukes, MD    Brief Narrative:  85 year old female who presented with dyspnea. She is known to have systolic heart failure ejection fraction 35 to 40%, CABG 30 lung mass in the left apical lobe, chronic disease stage III, and non-Hodgkin's lymphoma. Patient has developed progressive dyspnea for the last 2 days, she had a recent hospitalization where she was treated for volume overload related to heart failure and right upper lobe pneumonia. On her initial physical examination she was found in respiratory distress, hypoxic down to 60% oxygen saturation, she was placed on noninvasive mechanical ventilation. Her blood pressure was 109/66, heart rate 80, respiratory rate 32, oxygen saturation 99% on BiPAP. She had accessory muscle use, decreased air entry bilaterally with marked expiratory wheezing and rales, heart S1-S2 present, tachycardic, abdomen soft, positive lower extremity edema. Her chest radiograph showed right upper lobe infiltrate, increased vascular congestion and cardiomegaly.  Patient was admitted to the hospital working diagnosis of acute on chronic hypoxic respiratory failure.  Assessment & Plan:   Principal Problem:   Acute on chronic respiratory failure with hypoxemia (HCC) Active Problems:   Non Hodgkin's lymphoma (HCC)   Hypothyroidism   Essential hypertension   Nonischemic cardiomyopathy (HCC)   Cavitating mass in left upper lung lobe   Acute on chronic systolic CHF (congestive heart failure), NYHA class 4 (HCC)   CKD (chronic kidney disease) stage 3, GFR 30-59 ml/min ([place])   1.Acute on chronic hypoxic respiratory failure,due to acute on chronic systolic heart failure exacerbation.Had episode of pulmonary edema and respiratory distress last night that improved with diuresis, will continue oxymetry monitoring, and further diuresis as needed. Chest film personally  reviewed, noted pulmonary edema. Continue heart failure management with carvedilol.   2. Left cavitary apical lung lesion. Multiple lung nodules, biopsy per bronch negative for malignancy. May need further diagnostic bronchoscopy, to rule out atypical mycobacterial disease, will consult pulmonary in am. Possible close follow up as outpatient.   3. HTN. Continue blood pressure control with carvedilol.    4. AKI on CKD stage 3 with hypokalemia. Stable renal function with serum cr at 1,59 with K at 4,2 and serum bicarbonate at 25. Will continue to follow volume status, check renal panel in am, will avoid hypotension and nephrotoxic medications.   5. Non Hodgkin's Lymphoma. In remission, follow as outpatient.   6. Hypothyroid. Continue levothyroxine.  DVT prophylaxis:enoxaparin Code Status:full Family Communication:I spoke with patient's son at the bedside and all questions were addressed.  Disposition Plan/ discharge barriers:pending clinical improvement   Body mass index is 24.45 kg/m. Malnutrition Type:      Malnutrition Characteristics:      Nutrition Interventions:     RN Pressure Injury Documentation:     Consultants:     Procedures:     Antimicrobials:       Subjective: Patient had rapid response last night with worsening work of breathing, required non invasive mechanical ventilation and IV furosemide, This am is feeling better, with improved dyspnea but not yet back to baseline, no nausea or vomiting, no chest pain, Very weak and deconditioned.   Objective: Vitals:   01/15/19 0443 01/15/19 0600 01/15/19 0701 01/15/19 0754  BP: 110/68   116/76  Pulse:    83  Resp: (!) 23 (!) 25 (!) 21 (!) 25  Temp:    97.7 F (36.5 C)  TempSrc:    Oral  SpO2: 100% 99%  100% 100%  Weight:      Height:       No intake or output data in the 24 hours ending 01/15/19 1100 Filed Weights   01/14/19 0500 01/14/19 0600 01/15/19 0406  Weight: 64.1 kg  63.7 kg 62.6 kg    Examination:   General: deconditioned  Neurology: Awake and alert, non focal  E ENT: mild pallor, no icterus, oral mucosa moist Cardiovascular: No JVD. S1-S2 present, rhythmic, no gallops, rubs, or murmurs. No lower extremity edema. Pulmonary: positive breath sounds bilaterally, poor air movement, no wheezing, or rhonchi scattered rales. Gastrointestinal. Abdomen with no organomegaly, non tender, no rebound or guarding Skin. No rashes Musculoskeletal: no joint deformities     Data Reviewed: I have personally reviewed following labs and imaging studies  CBC: Recent Labs  Lab 01/10/19 0907 01/12/19 1659 01/12/19 1759 01/13/19 0309  WBC 4.2 5.7  --  4.3  NEUTROABS 1.9  --   --  3.2  HGB 12.5 11.7* 11.6* 11.5*  HCT 39.4 38.2 34.0* 36.3  MCV 89.7 91.4  --  89.0  PLT 159 156  --  357*   Basic Metabolic Panel: Recent Labs  Lab 01/10/19 0907 01/12/19 1701 01/12/19 1759 01/13/19 0309 01/14/19 0308 01/15/19 0227  NA 140 131* 135 137 139 136  K 4.3 4.1 3.6 4.0 3.1* 4.2  CL 103 97*  --  104 102 104  CO2 28 22  --  21* 27 25  GLUCOSE 138* 280*  --  88 95 131*  BUN 30* 23  --  23 21 27*  CREATININE 1.49* 1.62*  --  1.38* 1.52* 1.59*  CALCIUM 10.3 9.2  --  9.2 9.4 9.6  MG  --   --   --   --   --  1.9   GFR: Estimated Creatinine Clearance: 22.2 mL/min (A) (by C-G formula based on SCr of 1.59 mg/dL (H)). Liver Function Tests: Recent Labs  Lab 01/10/19 0907 01/12/19 1701  AST 29 67*  ALT 25 53*  ALKPHOS 113 103  BILITOT 0.7 0.9  PROT 8.2* 8.2*  ALBUMIN 3.7 3.0*   No results for input(s): LIPASE, AMYLASE in the last 168 hours. No results for input(s): AMMONIA in the last 168 hours. Coagulation Profile: Recent Labs  Lab 01/12/19 1702 01/12/19 1830  INR 1.3* 1.3*   Cardiac Enzymes: No results for input(s): CKTOTAL, CKMB, CKMBINDEX, TROPONINI in the last 168 hours. BNP (last 3 results) No results for input(s): PROBNP in the last 8760  hours. HbA1C: No results for input(s): HGBA1C in the last 72 hours. CBG: No results for input(s): GLUCAP in the last 168 hours. Lipid Profile: No results for input(s): CHOL, HDL, LDLCALC, TRIG, CHOLHDL, LDLDIRECT in the last 72 hours. Thyroid Function Tests: No results for input(s): TSH, T4TOTAL, FREET4, T3FREE, THYROIDAB in the last 72 hours. Anemia Panel: No results for input(s): VITAMINB12, FOLATE, FERRITIN, TIBC, IRON, RETICCTPCT in the last 72 hours.    Radiology Studies: I have reviewed all of the imaging during this hospital visit personally     Scheduled Meds: . carvedilol  6.25 mg Oral BID WC  . heparin  5,000 Units Subcutaneous Q8H  . levothyroxine  25 mcg Oral Q0600  . sodium chloride flush  3 mL Intravenous Once  . sodium chloride flush  3 mL Intravenous Q12H   Continuous Infusions: . sodium chloride       LOS: 3 days         Gerome Apley, MD

## 2019-01-15 NOTE — Significant Event (Addendum)
Rapid Response Event Note  Overview: Respiratory Distress  Initial Focused Assessment: Called by RN about patient having increased WOB and SOB. Per RN, patient's breathing was more labored. Patient received an albuterol neb and Ativan earlier and her respiratory status did not improve. Upon arrival, patient was short of breath, increased WOB, + use of accessory muscles. Oxygen saturations > 98% on 3 L [place]. Patient was on BIPAP a few days ago. Lung sounds coarse crackles throughout R > L and diminished in the bases. I paged the [facility] NP and then I was called away to a medical emergency at [phone]. NP called me back and I asked that RN be contacted.  Interventions: --  CXR  --  ABG -- Lasix  40 mg IV x 1 -- Ativan 0.5 mg IV x 1 -- BIPAP   Plan of Care: -- I called the RN at [phone] for an update. RN informed that the above orders were given. Patient was more comfortable and not in distress. ABG was normal and not placed on BIPAP at this time.  Event Summary:    at    Call Time 0303 Arrival Time 0305 End Time 0312  Crystal [name]

## 2019-01-15 NOTE — Progress Notes (Signed)
Called to assess pt for increased WOB and SOB. Upon RT assessment, pt with clear bs on R, diminished and slightly coarse on L, RR 40. ABG obtained, results were normal. RT check back with pt, RR 28, SpO2 100% on 3L, pt appeared to be in no obvious respiratory distress at that time. BIPAP has been held for now. RT will continue to monitor.

## 2019-01-15 NOTE — Evaluation (Signed)
Occupational Therapy Evaluation Patient Details Name: LAHNA NATH MRN: [id number] DOB: [birth year] Today's Date: 01/15/2019    History of Present Illness NILAH BELCOURT is a 85 y.o. 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 admitted with acute onset of resp failure with hypoxmia    Clinical Impression   Pt PTA: living with spouse and cared for pt prior. Pt required assist as needed for ADL and IADL provided for pt. Pt currently limited by lethargy and fatigue. Pt with decreased strength with mobility using RW with minA and very SOB. SpO2 levels>90% on 3L [place]. Pt performing transfers with minA for stability. Pt requires assist for all ADL and RN called about coughing episode related to eating as OTR was passing by. Tray removed and family notified pt not alert enough to eat. Pt would benefit from continued OT skilled services for ADL, mobility and safety in [place] setting. OT to follow acutely.    Follow Up Recommendations  Home health OT;Supervision/Assistance - 24 hour    Equipment Recommendations  None recommended by OT    Recommendations for Other Services       Precautions / Restrictions Precautions Precautions: Fall Precaution Comments: Watch O2 Restrictions Weight Bearing Restrictions: No      Mobility Bed Mobility Overal bed mobility: Needs Assistance Bed Mobility: Rolling;Sidelying to Sit Rolling: Min guard Sidelying to sit: Min assist(elevation of trunk)          Transfers Overall transfer level: Needs assistance Equipment used: Rolling walker (2 wheeled) Transfers: Sit to/from Stand Sit to Stand: Min guard         General transfer comment: assist for safety and lines    Balance Overall balance assessment: Mild deficits  observed, not formally tested                                         ADL either performed or assessed with clinical judgement   ADL Overall ADL's : Needs assistance/impaired Eating/Feeding: Minimal assistance;Sitting Eating/Feeding Details (indicate cue type and reason): walking by room- pt with chocking episode on regular diet. pt spit out food adn RN alerted. Family was feeding meal and pt appeared too drowsy. Grooming: Minimal assistance;Sitting   Upper Body Bathing: Moderate assistance;Sitting   Lower Body Bathing: Maximal assistance;Sitting/lateral leans;Sit to/from stand   Upper Body Dressing : Moderate assistance;Sitting   Lower Body Dressing: Maximal assistance;Sitting/lateral leans;Sit to/from stand   Toilet Transfer: Minimal assistance;Comfort height toilet;Grab bars   Toileting- Clothing Manipulation and Hygiene: Maximal assistance;Sitting/lateral lean;Sit to/from stand       Functional mobility during ADLs: Minimal assistance;Rolling walker General ADL Comments: Pt requires increased assist due to weakness and lethargy.     Vision Baseline Vision/History: No visual deficits       Perception     Praxis      Pertinent Vitals/Pain       Hand Dominance Right   Extremity/Trunk Assessment         Cervical / Trunk Assessment Cervical / Trunk Assessment: Normal   Communication Communication Communication: No difficulties   Cognition Arousal/Alertness: Lethargic;Suspect due to medications Behavior During Therapy: [facility] for tasks assessed/performed Overall Cognitive Status: Impaired/Different from baseline Area of Impairment: Following  commands;Safety/judgement;Awareness                       Following Commands: Follows multi-step commands consistently Safety/Judgement: Decreased awareness of deficits         General Comments  family very supportive and in room. refusing idea of SNF preferring home set-up with [facility]     Exercises     Shoulder Instructions      Home Living Family/patient expects to be discharged to:: Private residence Living Arrangements: Spouse/significant other Available Help at Discharge: Family;Available 24 hours/day Type of Home: House Home Access: Stairs to enter [employer] of Steps: 3 Entrance Stairs-Rails: Right Home Layout: One level     Bathroom Shower/Tub: Teacher, early years/pre: Standard     Home Equipment: Cane - single point;Walker - 2 wheels;Grab bars - tub/shower(does not use either)          Prior Functioning/Environment Level of Independence: Independent                 OT Problem List: Decreased strength;Decreased activity tolerance;Impaired balance (sitting and/or standing);Decreased coordination;Decreased safety awareness;Pain      OT Treatment/Interventions: Self-care/ADL training;Therapeutic exercise;Neuromuscular education;Energy conservation;Therapeutic activities;Patient/family education;Balance training    OT Goals(Current goals can be found in the care plan section) Acute Rehab OT Goals Patient Stated Goal: go home when feeling better OT Goal Formulation: With patient Time For Goal Achievement: 01/29/19 Potential to Achieve Goals: Good ADL Goals Pt Will Perform Grooming: Independently Pt Will Perform Lower Body Dressing: with modified independence Pt Will Transfer to Toilet: with supervision Additional ADL Goal #1: Pt will perform ADL functional mobility and ADL functional transfers with supervisionA  OT Frequency: Min 2X/week   Barriers to D/C:            Co-evaluation              AM-PAC OT "6 Clicks" Daily Activity     Outcome Measure Help from another person eating meals?: None Help from another person taking care of personal grooming?: None Help from another person toileting, which includes using toliet, bedpan, or urinal?: A Lot Help from another person bathing (including washing, rinsing,  drying)?: A Lot Help from another person to put on and taking off regular upper body clothing?: A Little Help from another person to put on and taking off regular lower body clothing?: A Lot 6 Click Score: 17   End of Session Equipment Utilized During Treatment: Gait belt;Rolling walker Nurse Communication: Mobility status;Other (comment)(choking episode)  Activity Tolerance: Patient limited by fatigue;Patient limited by lethargy Patient left: in chair;with call bell/phone within reach;with chair alarm set;with family/visitor present  OT Visit Diagnosis: Unsteadiness on feet (R26.81);Muscle weakness (generalized) (M62.81)                Time: 6440-3474 OT Time Calculation (min): 35 min Charges:  OT General Charges $OT Visit: 1 Visit OT Evaluation $OT Eval Moderate Complexity: 1 Mod OT Treatments $Self Care/Home Management : 8-22 mins  Ebony Hail Harold Hedge) Marsa Aris OTR/L Acute Rehabilitation Services Pager: [phone] Office: [phone]   Fredda Hammed 01/15/2019, 4:53 PM

## 2019-01-16 LAB — BASIC METABOLIC PANEL
ANION GAP: 6 (ref 5–15)
BUN: 24 mg/dL — ABNORMAL HIGH (ref 8–23)
CALCIUM: 9.3 mg/dL (ref 8.9–10.3)
CO2: 29 mmol/L (ref 22–32)
Chloride: 103 mmol/L (ref 98–111)
Creatinine, Ser: 1.38 mg/dL — ABNORMAL HIGH (ref 0.44–1.00)
GFR calc non Af Amer: 35 mL/min — ABNORMAL LOW (ref >60)
GFR, EST AFRICAN AMERICAN: 41 mL/min — AB (ref >60)
Glucose, Bld: 108 mg/dL — ABNORMAL HIGH (ref 70–99)
Potassium: 4.1 mmol/L (ref 3.5–5.1)
Sodium: 138 mmol/L (ref 135–145)

## 2019-01-16 MED ORDER — FUROSEMIDE 40 MG PO TABS: 40.0000 mg | ORAL_TABLET | Freq: Every day | ORAL | 0 refills | Status: DC

## 2019-01-16 MED ORDER — FUROSEMIDE 40 MG PO TABS
40.0000 mg | ORAL_TABLET | Freq: Every day | ORAL | Status: DC
  Administered 2019-01-16: 40 mg via ORAL
  Filled 2019-01-16: qty 1

## 2019-01-16 NOTE — Discharge Summary (Signed)
Physician Discharge Summary  Crystal [name] YIR:[id number] DOB: 04/28/1934 DOA: 01/12/2019  PCP: Mosie Lukes, MD  Admit date: 01/12/2019 Discharge date: 01/16/2019  Admitted From: Home  Disposition:  Home   Recommendations for Outpatient Follow-up and new medication changes:  1. Follow up with Dr. Charlett Blake in 7 days.  2. Furosemide has been increased to 40 mg daily. 3. Hold on K supplements for now due to decreased GFR.   Home Health: Yes  Equipment/Devices: no    Discharge Condition: stable  CODE STATUS: full Diet recommendation:  Heart healthy, fluid (1200 ml) and salt (2 grams), restriction.    Brief/Interim Summary: 85 year old female who presented with dyspnea. She is known to have systolic heart failure ejection fraction 35 to 40%, CABG, cavitary lung mass in the left apical lobe, chronic kidney disease stage III, and Hx of non-Hodgkin's lymphoma. Patient developed progressive dyspnea for the last 2 days, she had a recent hospitalization where she was treated for volume overload related to heart failure and right upper lobe pneumonia. On her initial physical examination she was found in respiratory distress, hypoxic down to 60% oxygen saturation, she was placed on noninvasive mechanical ventilation. Her blood pressure was 109/66, heart rate 80, respiratory rate 32, oxygen saturation 99% on BiPAP. She had accessory muscle use, decreased air entry bilaterally with marked expiratory wheezing and rales, heart S1-S2 present, tachycardic, abdomen soft, positive lower extremity edema.  Sodium 131, potassium 4.1, chloride 97, bicarb 22, glucose 280, BUN 23, creatinine 1.62,AST 67, ALT 53, BNP 3955, arterial blood gas pH 7.36, PCO2 41, PO2 106, bicarb 23.  White count 4.3, hemoglobin 9.5, hematocrit 36.3, platelets 106.  her chest radiograph showed right upper lobe infiltrate, increased vascular congestion and cardiomegaly.  Her EKG was sinus rhythm, 95 bpm, had a left axis deviation, left  anterior fascicular block, right bundle branch block, to inversions lead I and aVL.   Patient was admitted to the hospital with a working diagnosis of acute on chronic hypoxic respiratory failure.  1.  Acute on chronic hypoxic respiratory failure due to acute on chronic systolic heart failure exacerbation.  Patient was admitted to the progressive care unit, she received aggressive diuresis with IV furosemide, negative fluid balance was achieved and she was weaned off noninvasive mechanical ventilation.  Her discharge oximetry is up to 100% on 2 L per nasal cannula, oximetry will be tested on room air before discharge.  Heart failure management with carvedilol.  2.  Left cavitary apical lung lesion, multiple lung nodules, biopsy per bronchoscopy was negative for malignancy (August 2019).  CT chest from January 10, 2019 showed persistent cavitary mass within the left apex, slightly decreased in size.  Positive bilateral pulmonary nodules.  Will arrange for outpatient follow-up with pulmonary.  Her right apical pneumonia seems to be improving. Will check swallow evaluation before discharge.   3.  Acute kidney injury chronic kidney stage III with hypokalemia.  Patient responded well to diuresis with IV furosemide, her peak creatinine reached 1.59, at discharge is down to 1.38 with potassium of 4.1.  Will recommend continue diuresis with furosemide 40 mg daily and follow-up renal panel next week.  Due to decreased GFR will hold on potassium supplements for now.  4.  Hypertension.  Continue carvedilol for blood pressure control, her systolic blood pressure is 111 mmHg at discharge.  5.  Non-Hodgkin's lymphoma.  Currently on remission.  Follow-up with oncology as an outpatient.  6.  Hypothyroid.  Continue levothyroxine.    Discharge  Diagnoses:  Principal Problem:   Acute on chronic respiratory failure with hypoxemia (HCC) Active Problems:   Non Hodgkin's lymphoma ([place])   Hypothyroidism   Essential  hypertension   Nonischemic cardiomyopathy (HCC)   Cavitating mass in left upper lung lobe   Acute on chronic systolic CHF (congestive heart failure), NYHA class 4 (HCC)   CKD (chronic kidney disease) stage 3, GFR 30-59 ml/min (HCC)    Discharge Instructions   Allergies as of 01/16/2019      Reactions   Lisinopril Swelling   Facial and lip swelling   Iohexol Itching         Medication List    STOP taking these medications   potassium chloride SA 20 MEQ tablet Commonly known as:  K-DUR,KLOR-CON     TAKE these medications   carvedilol 6.25 MG tablet Commonly known as:  COREG Take 1 tablet (6.25 mg total) by mouth 2 (two) times daily.   CENTRUM PO Take 1 tablet by mouth daily.   Fish Oil 1000 MG Caps Take 1,000 mg by mouth daily.   furosemide 40 MG tablet Commonly known as:  LASIX Take 1 tablet (40 mg total) by mouth daily for 30 days. Start taking on:  January 17, 2019 What changed:    medication strength  how much to take   levothyroxine 25 MCG tablet Commonly known as:  SYNTHROID, LEVOTHROID TAKE 1 TABLET BY MOUTH ONCE DAILY BEFORE BREAKFAST What changed:  See the new instructions.   magnesium hydroxide 400 MG/5ML suspension Commonly known as:  MILK OF MAGNESIA Take 15 mLs by mouth daily as needed for mild constipation.   PROBIOTIC DAILY PO Take 1 capsule by mouth daily.       Allergies  Allergen Reactions  . Lisinopril Swelling    Facial and lip swelling  . Iohexol Itching         Consultations:     Procedures/Studies: Ct Chest Wo Contrast  Result Date: 01/10/2019 CLINICAL DATA:  Followup cavitary lung mass. EXAM: CT CHEST WITHOUT CONTRAST TECHNIQUE: Multidetector CT imaging of the chest was performed following the standard protocol without IV contrast. COMPARISON:  06/24/2018 FINDINGS: Cardiovascular: The heart size is mildly enlarged. Aortic atherosclerosis identified. Calcification in the left circumflex coronary artery noted.  Mediastinum/Nodes: Calcified nodule in inferior pole of left lobe of thyroid gland is again noted. The trachea appears patent. Unremarkable appearance of the esophagus. Extensive partially calcified mediastinal and hilar lymphadenopathy is again noted. -index pre-vascular lymph node measures 1.2 cm, image 38/2. Previously 1.4 cm. -index right paratracheal lymph node measures 1.6 cm, image 42/2. Unchanged. -the index subcarinal lymph node measures 2 cm, image 55/2. Previously 2.1 cm. -the index right hilar lymph node measures 2.9 cm, image 61/2. Stable. -left hilar lymph node measures 2.7 cm, image 54/2. Previously 2.9 cm. Lungs/Pleura: New moderate bilateral pleural effusions are identified. There is new partial atelectasis of the right middle lobe and lingula. Also new from previous exam is diffuse asymmetric ground-glass attenuation, thickening of the pulmonary interstitium and airspace consolidation within the right upper lobe, image 34/3. The left upper lobe cavitary lung mass is again noted. On today's exam this measures 3.7 by 2.7 cm, image 15/2. Previously 4.2 x 3.4 cm. Right lung perifissural and subpleural nodularity is again noted. Index nodule within the superior segment of right lower lobe measures 1.5 cm, image 53/3. Previously 1.3 cm. Index nodule within the posterior right middle lobe measures 1.4 cm, image 54/3. Unchanged. Subpleural nodule within the anterolateral right  lower lobe measures 4 mm, image 69/3. Previously 3 mm. The solid round peribronchovascular nodule in the right lower lobe measures 1 cm, image 73/3. Unchanged from previous exam. The subpleural nodule within the anterolateral left lower lobe measures 0.9 cm, image 54/3. Unchanged. Upper Abdomen: No acute abnormality Musculoskeletal: No chest wall mass or suspicious bone lesions identified. IMPRESSION: 1. The cavitary mass within the left apex is again noted and is slightly decreased in size from previous exam. 2. Additional scattered  subpleural, perifissural and peribronchovascular nodules in both lungs are again noted and are not significantly changed in the interval. 3. Interval development of moderate bilateral pleural effusions, right middle lobe and lingular partial atelectasis, and diffuse ground-glass attenuation, interstitial thickening and airspace consolidation in the right upper lobe. Imaging findings are concerning for superimposed pneumonia and possible CHF. 4. Stable appearance of enlarged and partially calcified mediastinal lymph nodes. 5. Aortic atherosclerosis and coronary artery calcification. Aortic Atherosclerosis (ICD10-I70.0). Electronically Signed   By: Kerby Moors M.D.   On: 01/10/2019 13:37   Dg Chest Port 1 View  Result Date: 01/15/2019 CLINICAL DATA:  Shortness of breath. EXAM: PORTABLE CHEST 1 VIEW COMPARISON:  01/14/2019; 01/12/2019; 11/29/2018; chest CT-01/10/2019 FINDINGS: Grossly unchanged cardiac silhouette and mediastinal contours with atherosclerotic plaque within the thoracic aorta. Grossly unchanged small to moderate-sized bilateral effusions and associated bibasilar heterogeneous/consolidative opacities, greater than right. Right upper lung volume loss and associated bilateral perihilar interstitial opacities are unchanged. Right upper lobe nodular opacities and associated biapical pleuroparenchymal thickening are also unchanged. The pulmonary vasculature appears less distinct than present examination with cephalization flow. No new focal airspace opacities. No pneumothorax. No acute osseous abnormalities. IMPRESSION: 1. Suspected development of pulmonary edema superimposed advanced bilateral parenchymal abnormalities. 2. Unchanged small to moderate-sized bilateral effusions. Electronically Signed   By: Sandi Mariscal M.D.   On: 01/15/2019 07:39   Dg Chest Port 1 View  Result Date: 01/14/2019 CLINICAL DATA:  Dyspnea.  History of lung mass. EXAM: PORTABLE CHEST 1 VIEW COMPARISON:  01/12/2019 and older  exams. FINDINGS: Bilateral upper lobe opacities are stable. Lung base opacities and pleural effusions are also unchanged. No new lung abnormalities. No pneumothorax. IMPRESSION: 1. No change from the previous day's study. 2. Persistent upper lobe interstitial and airspace opacities. Cavitary mass in the left apex noted on the prior CT is not well-defined, but is unchanged radiographically. Persistent pleural effusions. No new lung abnormalities. Electronically Signed   By: Lajean Manes M.D.   On: 01/14/2019 09:04   Dg Chest Port 1 View  Result Date: 01/12/2019 CLINICAL DATA:  Shortness of breath EXAM: PORTABLE CHEST 1 VIEW COMPARISON:  11/29/2018 FINDINGS: Cardiomegaly. Perihilar and lower lobe airspace opacities are noted. Suspect small left effusion. No acute bony abnormality. IMPRESSION: Perihilar opacities and bibasilar opacities, left greater than right. This could reflect atelectasis or infiltrates. Cardiomegaly, vascular congestion Electronically Signed   By: Rolm Baptise M.D.   On: 01/12/2019 17:47       Subjective: Patient is feeling back to her normal, dyspnea has improved, no significant nausea, no vomiting.    Discharge Exam: Vitals:   01/16/19 0300 01/16/19 0721  BP: 113/74 111/75  Pulse: 82 79  Resp: (!) 27 (!) 27  Temp: 97.6 F (36.4 C) (!) 97.5 F (36.4 C)  SpO2: 96% 100%   Vitals:   01/15/19 2300 01/16/19 0300 01/16/19 0500 01/16/19 0721  BP: 99/70 113/74  111/75  Pulse: 81 82  79  Resp: 20 (!) 27  (!) 27  Temp: 97.7 F (36.5 C) 97.6 F (36.4 C)  (!) 97.5 F (36.4 C)  TempSrc: Oral Oral  Oral  SpO2: 100% 96%  100%  Weight:   61.3 kg   Height:        General: not in pain or dyspnea.  Neurology: Awake and alert, non focal  E ENT: no pallor, no icterus, oral mucosa moist Cardiovascular: No JVD. S1-S2 present, rhythmic, no gallops, rubs, or murmurs. No lower extremity edema. Pulmonary: vesicular breath sounds bilaterally,  no wheezing, no significant rhonchi  or rales. Gastrointestinal. Abdomen with no organomegaly, non tender, no rebound or guarding Skin. No rashes Musculoskeletal: no joint deformities   The results of significant diagnostics from this hospitalization (including imaging, microbiology, ancillary and laboratory) are listed below for reference.     Microbiology: Recent Results (from the past 240 hour(s))  Blood culture (routine x 2)     Status: None (Preliminary result)   Collection Time: 01/12/19  5:00 PM  Result Value Ref Range Status   Specimen Description BLOOD RIGHT ANTECUBITAL  Final   Special Requests   Final    BOTTLES DRAWN AEROBIC AND ANAEROBIC Blood Culture adequate volume Performed at [facility], [address], [place], [place] [address]    Culture NO GROWTH 4 DAYS  Final   Report Status PENDING  Incomplete  Blood culture (routine x 2)     Status: None (Preliminary result)   Collection Time: 01/12/19  5:41 PM  Result Value Ref Range Status   Specimen Description BLOOD LEFT ANTECUBITAL  Final   Special Requests   Final    BOTTLES DRAWN AEROBIC AND ANAEROBIC Blood Culture adequate volume Performed at [facility], [place] [address], [place], [place] [address]    Culture NO GROWTH 4 DAYS  Final   Report Status PENDING  Incomplete  MRSA PCR Screening     Status: None   Collection Time: 01/13/19  1:57 AM  Result Value Ref Range Status   MRSA by PCR NEGATIVE NEGATIVE Final    Comment:        The GeneXpert MRSA Assay (FDA approved for NASAL specimens only), is one component of a comprehensive MRSA colonization surveillance program. It is not intended to diagnose MRSA infection nor to guide or monitor treatment for MRSA infections. Performed at [facility], [place] [address], [place], [place] [address]      Labs: BNP (last 3 results) Recent Labs    11/29/18 1228 01/12/19 1701  BNP 2,710.4* 3,559.7*   Basic Metabolic Panel: Recent Labs  Lab 01/12/19 1701 01/12/19 1759  01/13/19 0309 01/14/19 0308 01/15/19 0227 01/16/19 0256  NA 131* 135 137 139 136 138  K 4.1 3.6 4.0 3.1* 4.2 4.1  CL 97*  --  104 102 104 103  CO2 22  --  21* 27 25 29   GLUCOSE 280*  --  88 95 131* 108*  BUN 23  --  23 21 27* 24*  CREATININE 1.62*  --  1.38* 1.52* 1.59* 1.38*  CALCIUM 9.2  --  9.2 9.4 9.6 9.3  MG  --   --   --   --  1.9  --    Liver Function Tests: Recent Labs  Lab 01/10/19 0907 01/12/19 1701  AST 29 67*  ALT 25 53*  ALKPHOS 113 103  BILITOT 0.7 0.9  PROT 8.2* 8.2*  ALBUMIN 3.7 3.0*   No results for input(s): LIPASE, AMYLASE in the last 168 hours.  No results for input(s): AMMONIA in the last 168 hours. CBC: Recent Labs  Lab 01/10/19 0907 01/12/19 1659 01/12/19 1759 01/13/19 0309  WBC 4.2 5.7  --  4.3  NEUTROABS 1.9  --   --  3.2  HGB 12.5 11.7* 11.6* 11.5*  HCT 39.4 38.2 34.0* 36.3  MCV 89.7 91.4  --  89.0  PLT 159 156  --  106*   Cardiac Enzymes: No results for input(s): CKTOTAL, CKMB, CKMBINDEX, TROPONINI in the last 168 hours. BNP: Invalid input(s): POCBNP CBG: No results for input(s): GLUCAP in the last 168 hours. D-Dimer No results for input(s): DDIMER in the last 72 hours. Hgb A1c No results for input(s): HGBA1C in the last 72 hours. Lipid Profile No results for input(s): CHOL, HDL, LDLCALC, TRIG, CHOLHDL, LDLDIRECT in the last 72 hours. Thyroid function studies No results for input(s): TSH, T4TOTAL, T3FREE, THYROIDAB in the last 72 hours.  Invalid input(s): FREET3 Anemia work up No results for input(s): VITAMINB12, FOLATE, FERRITIN, TIBC, IRON, RETICCTPCT in the last 72 hours. Urinalysis    Component Value Date/Time   COLORURINE YELLOW 11/29/2018 1410   APPEARANCEUR HAZY (A) 11/29/2018 1410   LABSPEC 1.010 11/29/2018 1410   PHURINE 6.0 11/29/2018 1410   GLUCOSEU NEGATIVE 11/29/2018 1410   GLUCOSEU NEGATIVE 10/20/2016 1119   HGBUR TRACE (A) 11/29/2018 1410   BILIRUBINUR NEGATIVE 11/29/2018 1410   BILIRUBINUR negative  06/22/2016 1047   BILIRUBINUR negative 06/11/2015 0953   KETONESUR NEGATIVE 11/29/2018 1410   PROTEINUR NEGATIVE 11/29/2018 1410   UROBILINOGEN 0.2 10/20/2016 1119   NITRITE POSITIVE (A) 11/29/2018 1410   LEUKOCYTESUR MODERATE (A) 11/29/2018 1410   Sepsis Labs Invalid input(s): PROCALCITONIN,  WBC,  LACTICIDVEN Microbiology Recent Results (from the past 240 hour(s))  Blood culture (routine x 2)     Status: None (Preliminary result)   Collection Time: 01/12/19  5:00 PM  Result Value Ref Range Status   Specimen Description BLOOD RIGHT ANTECUBITAL  Final   Special Requests   Final    BOTTLES DRAWN AEROBIC AND ANAEROBIC Blood Culture adequate volume Performed at [facility], [place] [address], [place], [place] [address]    Culture NO GROWTH 4 DAYS  Final   Report Status PENDING  Incomplete  Blood culture (routine x 2)     Status: None (Preliminary result)   Collection Time: 01/12/19  5:41 PM  Result Value Ref Range Status   Specimen Description BLOOD LEFT ANTECUBITAL  Final   Special Requests   Final    BOTTLES DRAWN AEROBIC AND ANAEROBIC Blood Culture adequate volume Performed at [facility], [place] [address], [place], [place] [address]    Culture NO GROWTH 4 DAYS  Final   Report Status PENDING  Incomplete  MRSA PCR Screening     Status: None   Collection Time: 01/13/19  1:57 AM  Result Value Ref Range Status   MRSA by PCR NEGATIVE NEGATIVE Final    Comment:        The GeneXpert MRSA Assay (FDA approved for NASAL specimens only), is one component of a comprehensive MRSA colonization surveillance program. It is not intended to diagnose MRSA infection nor to guide or monitor treatment for MRSA infections. Performed at [facility], [place] [address] [place], [place] [address]      Time coordinating discharge: 45 minutes  SIGNED:   Tawni Millers, MD  [facility] 01/16/2019, 9:20 AM

## 2019-01-16 NOTE — Care Management Important Message (Signed)
Important Message  Patient Details  Name: Crystal [name] MRN: [id number] Date of Birth: 1934-02-28   Medicare Important Message Given:  Yes    Orbie Pyo 01/16/2019, 4:15 PM

## 2019-01-16 NOTE — Consult Note (Signed)
   [facility] CM Inpatient Consult   01/16/2019  Crystal [name] Sep 03, 1934 [id number]  Patient screened for potential [facility] Care Management services due to unplanned readmission risk score of 18%, medium, and 2 hospitalizations within past 6 months.  Spoke with patient, spouse, and daughter at bedside to explain [place].  Patient states that she does not feel that she will need additional assistance when she returns home. States that her husband is available full time and that her daughter is a Marine scientist.  Patient declines services at this time. Accepted a [facility] brochure and informed that she may call at any time to engage services should she change her mind.   Crystal Cedars, MSN, [facility] Liaison Nurse Mobile Phone [phone]  Toll free office [phone]

## 2019-01-16 NOTE — Progress Notes (Signed)
Pt seen by MD, orders written for d/c.  Went over discharge instructions with pt and family, answered all questions.  Removed IV and telemetry.  Escorted for discharge via wheelchair with all belongings.  Will follow up outpatient with MD.

## 2019-01-16 NOTE — Evaluation (Signed)
Clinical/Bedside Swallow Evaluation Patient Details  Name: Crystal [name] MRN: [id number] Date of Birth: 11-01-1934  Today's Date: 01/16/2019 Time:        Past Medical History:  Past Medical History:  Diagnosis Date  . Anemia 08/21/2017  . Anxiety   . Cardiomyopathy    non ischemic NL cos on cath 08/2009, EF to 15-20%, Her follow up  2D echo  in 10/2009 showed impoved EFo 35-40%  . CHF (congestive heart failure) ([place])   . Depression   . Full dentures   . Hypercalcemia 04/20/2017  . Hypertension   . Hypothyroid   . Lung mass   . Medicare annual wellness visit, subsequent 12/14/2014   Follows with Dr Marin Olp Follows with Dr Deatra Ina, gastroenterology, last colonoscopy in 2012, repeat in 2017 Last Pap 2011, always normal, no need for repeat Last MGM in 2011, no concerns, declines for now Follows with Dr Stanford Breed of cardiology   . Non Hodgkin's lymphoma ([place]) 11/17/1999   -Dr. Marin Olp  . Vitamin D deficiency 10/17/2015  . Wears glasses    Past Surgical History:  Past Surgical History:  Procedure Laterality Date  . DILATION AND CURETTAGE OF UTERUS    . MULTIPLE TOOTH EXTRACTIONS    . NASAL POLYP EXCISION  1962  . TONSILLECTOMY  1952  . VIDEO BRONCHOSCOPY WITH ENDOBRONCHIAL NAVIGATION Right 06/29/2018   Procedure: VIDEO BRONCHOSCOPY WITH ENDOBRONCHIAL NAVIGATION;  Surgeon: Collene Gobble, MD;  Location: [place];  Service: Thoracic;  Laterality: Right;  Marland Kitchen VIDEO BRONCHOSCOPY WITH ENDOBRONCHIAL ULTRASOUND Right 06/29/2018   Procedure: VIDEO BRONCHOSCOPY WITH ENDOBRONCHIAL ULTRASOUND;  Surgeon: Collene Gobble, MD;  Location: [facility] OR;  Service: Thoracic;  Laterality: Right;   HPI:  85 year old female who presented with dyspnea.  She is known to have systolic heart failure ejection fraction 35 to 40%, CABG, cavitary lung mass in the left apical lobe, chronic kidney disease stage III, and Hx of non-Hodgkin's lymphoma.  Patient developed progressive dyspnea for the last 2 days, she had a recent  hospitalization where she was treated for volume overload related to heart failure and right upper lobe pneumonia. She was seen by speech therapy for a swallow evaluation in Jan, 2020. She was discharged with a regular diet and thin liquids at that time.    Assessment / Plan / Recommendation Clinical Impression  Patient was hospitalized in Jan of this year and was seen by Speech Therapy at that time for a swallow evaluation. She had no s/sx of aspiration at that time and was discharged on a regular diet with thin liquids. Patient reports no issues with swallowing or changes in her health until the morning of 01/12/19 when she was taking her medication (whole with liquids). She became short of breath soon after this and was unable to eat her breakfast. At bedside patient was given ice chips, water by cup and straw, puree and crackers. She had no clearing, coughing or change in vocal quality throughout trials. She also recalled issues with swallowing medications soon after her hospitalization in January. Recommend patient continue with current diet of Reg, thin liquids, but recommend medication to be taken in puree and crushed if pill is large (if medication can be crushed per pharmacy). Education completed with patient, her husband and her daughter. Nurse notified of recommendations. No further speech therapy recommended at this time.  SLP Visit Diagnosis: Dysphagia, unspecified (R13.10)    Aspiration Risk  Mild aspiration risk    Diet Recommendation Regular;Thin liquid   Liquid Administration  via: Cup;Straw Medication Administration: Whole meds with puree(crush medication if pill is large) Supervision: Patient able to self feed Compensations: Slow rate;Small sips/bites Postural Changes: Seated upright at 90 degrees    Other  Recommendations Oral Care Recommendations: Oral care BID   Follow up Recommendations None                  Prognosis Prognosis for Safe Diet Advancement: Good       Swallow Study   General Date of Onset: 01/12/19 HPI: 85 year old female who presented with dyspnea.  She is known to have systolic heart failure ejection fraction 35 to 40%, CABG, cavitary lung mass in the left apical lobe, chronic kidney disease stage III, and Hx of non-Hodgkin's lymphoma.  Patient developed progressive dyspnea for the last 2 days, she had a recent hospitalization where she was treated for volume overload related to heart failure and right upper lobe pneumonia. She was seen by speech therapy for a swallow evaluation in Jan, 2020. She was discharged with a regular diet and thin liquids at that time.  Type of Study: Bedside Swallow Evaluation Previous Swallow Assessment: 11/29/2018 Diet Prior to this Study: Regular;Thin liquids Temperature Spikes Noted: No Respiratory Status: Nasal cannula History of Recent Intubation: No Behavior/Cognition: Alert;Cooperative;Pleasant mood Oral Cavity Assessment: Within Functional Limits Oral Care Completed by SLP: No Oral Cavity - Dentition: Dentures, bottom;Dentures, top Vision: Functional for self-feeding Self-Feeding Abilities: Able to feed self Patient Positioning: Upright in bed Baseline Vocal Quality: Normal Volitional Cough: Strong Volitional Swallow: Able to elicit    Oral/Motor/Sensory Function Overall Oral Motor/Sensory Function: Within functional limits   Ice Chips Ice chips: Within functional limits Presentation: Spoon   Thin Liquid Thin Liquid: Within functional limits Presentation: Spoon    Nectar Thick Nectar Thick Liquid: Not tested   Honey Thick Honey Thick Liquid: Not tested   Puree Puree: Within functional limits Presentation: Spoon   Solid     Solid: Within functional limits Presentation: [place], MA, CCC-SLP 01/16/2019 11:17 AM

## 2019-01-16 NOTE — Progress Notes (Signed)
Physical Therapy Treatment Patient Details Name: Crystal [name] MRN: [id number] DOB: Dec 21, 1933 Today's Date: 01/16/2019    History of Present Illness LANIECE HORNBAKER is a 85 y.o. 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 She was admitted 01/12/19 with acute onset of resp failure with hypoxmia due to acute on chronic systolic heart failure exacerbation.      PT Comments    Pt is progressing well with her gait and mobility.  She has learned that if she walks and talks, currently, she gets short of breath.  O2 sats stayed stable with gait on RA.  DOE 2/4 at the end of gait and pt educated on pursed lip breathing for recovery.  Pt has a very supportive husband and daughter present for her session today and they are looking forward to going home.  They politely declined [place] services.  PT to follow acutely until d/c confirmed.    Follow Up Recommendations  Home health PT     Equipment Recommendations  None recommended by PT    Recommendations for Other Services   NA     Precautions / Restrictions Precautions Precautions: Fall Precaution Comments: Watch O2    Mobility  Bed Mobility Overal bed mobility: Modified Independent             General bed mobility comments: A little extra time needed, but no external assist.   Transfers Overall transfer level: Needs assistance Equipment used: Rolling walker (2 wheeled) Transfers: Sit to/from Stand Sit to Stand: Supervision;Min guard         General transfer comment: Supervision with RW, min guard without RW  Ambulation/Gait Ambulation/Gait assistance: Min Gaffer (Feet): 170 Feet Assistive device: Rolling walker (2 wheeled);1 person hand held assist Gait Pattern/deviations:  Step-through pattern;Staggering right;Staggering left Gait velocity: decreased Gait velocity interpretation: 1.31 - 2.62 ft/sec, indicative of limited community ambulator General Gait Details: Pt with mildly staggering gait pattern, min guard assist without RW and close supervision with RW due to staggering gait pattern.  Pt reported she got too SOB if she walked and talked, so we stayed silent during our walk.  O2 sats (once a good waveform could be found) were 94%, DOE 2/4 with gait.           Balance Overall balance assessment: Needs assistance Sitting-balance support: Feet supported;No upper extremity supported Sitting balance-Leahy Scale: Good     Standing balance support: No upper extremity supported;Single extremity supported Standing balance-Leahy Scale: Fair                              Cognition Arousal/Alertness: Awake/alert Behavior During Therapy: WFL for tasks assessed/performed Overall Cognitive Status: Impaired/Different from baseline Area of Impairment: Memory                               General Comments: Pt reported to me that she was 85 years old today.  It was not until family reacted that she correct herself.          General Comments General comments (skin integrity, edema, etc.): Educated pt on pursed lip breathing if SOB.      Pertinent Vitals/Pain Pain Assessment: No/denies pain  Prior Function            PT Goals (current goals can now be found in the care plan section) Acute Rehab PT Goals Patient Stated Goal: go home when feeling better Progress towards PT goals: Progressing toward goals    Frequency    Min 3X/week      PT Plan Current plan remains appropriate       AM-PAC PT "6 Clicks" Mobility   Outcome Measure  Help needed turning from your back to your side while in a flat bed without using bedrails?: None Help needed moving from lying on your back to sitting on the side of a flat bed  without using bedrails?: None Help needed moving to and from a bed to a chair (including a wheelchair)?: A Little Help needed standing up from a chair using your arms (e.g., wheelchair or bedside chair)?: A Little Help needed to walk in hospital room?: A Little Help needed climbing 3-5 steps with a railing? : A Little 6 Click Score: 20    End of Session   Activity Tolerance: Patient limited by fatigue Patient left: in bed;Other (comment);with family/visitor present(seated EOB with family and SLP) Nurse Communication: Mobility status PT Visit Diagnosis: Unsteadiness on feet (R26.81)     Time: 7373-6681 PT Time Calculation (min) (ACUTE ONLY): 23 min  Charges:  $Gait Training: 8-22 mins $Therapeutic Activity: 8-22 mins                    Zoya Sprecher B. Aleesha Ringstad, PT, DPT  Acute Rehabilitation [phone] pager #[phone] office   01/16/2019, 11:21 AM

## 2019-01-17 ENCOUNTER — Telehealth: Payer: Self-pay | Admitting: *Deleted

## 2019-01-17 LAB — CULTURE, BLOOD (ROUTINE X 2)
Culture: NO GROWTH
Culture: NO GROWTH
Special Requests: ADEQUATE
Special Requests: ADEQUATE

## 2019-01-17 NOTE — Telephone Encounter (Signed)
Called patient left message for patient to call the office back to schedule appt with PCP  Nurse triage may handle

## 2019-01-17 NOTE — Telephone Encounter (Signed)
Transition Care Management Follow-up Telephone Call   Date discharged?  01/16/2019   How have you been since you were released from the hospital? "my neck hurts"   Do you understand why you were in the hospital? yes   Do you understand the discharge instructions? yes   Where were you discharged to? Home with husband   Items Reviewed:  Medications reviewed: pt states she will bring list with her to follow up visit  Allergies reviewed: yes  Dietary changes reviewed: yes  Referrals reviewed: yes   Functional Questionnaire:   Activities of Daily Living (ADLs):   She states they are independent in the following: ambulation, bathing and hygiene, feeding, continence, grooming, toileting and dressing States they require assistance with the following: na   Any transportation issues/concerns?: no   Any patient concerns? no   Confirmed importance and date/time of follow-up visits scheduled yes  Provider Appointment booked with PCP 01/24/2019@340   Confirmed with patient if condition begins to worsen call PCP or go to the ER.  Patient was given the office number and encouraged to call back with question or concerns.  : yes

## 2019-01-19 ENCOUNTER — Other Ambulatory Visit: Payer: Medicare Other

## 2019-01-19 ENCOUNTER — Ambulatory Visit: Payer: Medicare Other | Admitting: Hematology & Oncology

## 2019-01-24 ENCOUNTER — Ambulatory Visit (HOSPITAL_BASED_OUTPATIENT_CLINIC_OR_DEPARTMENT_OTHER)
Admission: RE | Admit: 2019-01-24 | Discharge: 2019-01-24 | Disposition: A | Discharge status: Home / Self Care | Payer: Medicare Other | Source: Ambulatory Visit | Attending: Family Medicine | Admitting: Family Medicine

## 2019-01-24 ENCOUNTER — Ambulatory Visit (INDEPENDENT_AMBULATORY_CARE_PROVIDER_SITE_OTHER): Payer: Medicare Other | Admitting: Family Medicine

## 2019-01-24 ENCOUNTER — Encounter: Payer: Self-pay | Admitting: Family Medicine

## 2019-01-24 VITALS — BP 118/75 | HR 80 | Temp 97.8°F | Resp 18 | Ht 63.0 in | Wt 130.8 lb

## 2019-01-24 DIAGNOSIS — J9621 Acute and chronic respiratory failure with hypoxia: Secondary | ICD-10-CM

## 2019-01-24 DIAGNOSIS — F411 Generalized anxiety disorder: Secondary | ICD-10-CM

## 2019-01-24 DIAGNOSIS — I10 Essential (primary) hypertension: Secondary | ICD-10-CM | POA: Diagnosis not present

## 2019-01-24 DIAGNOSIS — N183 Chronic kidney disease, stage 3 unspecified: Secondary | ICD-10-CM

## 2019-01-24 DIAGNOSIS — J90 Pleural effusion, not elsewhere classified: Secondary | ICD-10-CM | POA: Diagnosis not present

## 2019-01-24 DIAGNOSIS — E039 Hypothyroidism, unspecified: Secondary | ICD-10-CM | POA: Diagnosis not present

## 2019-01-24 DIAGNOSIS — R0602 Shortness of breath: Secondary | ICD-10-CM | POA: Diagnosis not present

## 2019-01-24 MED ORDER — BENZONATATE 100 MG PO CAPS: 100.0000 mg | ORAL_CAPSULE | Freq: Three times a day (TID) | ORAL | 1 refills | Status: DC | PRN

## 2019-01-24 MED ORDER — ALPRAZOLAM 0.25 MG PO TABS: 0.2500 mg | ORAL_TABLET | Freq: Two times a day (BID) | ORAL | 1 refills | Status: DC | PRN

## 2019-01-24 MED ORDER — ALBUTEROL SULFATE HFA 108 (90 BASE) MCG/ACT IN AERS: 1.0000 | INHALATION_SPRAY | RESPIRATORY_TRACT | 2 refills | Status: DC | PRN

## 2019-01-24 MED ORDER — LEVOTHYROXINE SODIUM 25 MCG PO TABS: 25.0000 ug | ORAL_TABLET | Freq: Every day | ORAL | 1 refills | Status: DC

## 2019-01-24 NOTE — Patient Instructions (Signed)
Pleural Effusion Pleural effusion is an abnormal buildup of fluid in the layers of tissue between the lungs and the inside of the chest (pleural space) The two layers of tissue that line the lungs and the inside of the chest are called pleura. Usually, there is no air in the space between the pleura, only a thin layer of fluid. Some conditions can cause a large amount of fluid to build up, which can cause the lung to collapse if untreated. A pleural effusion is usually caused by another disease that requires treatment. What are the causes? Pleural effusion can be caused by:  Heart failure.  Certain infections, such as pneumonia or tuberculosis.  Cancer.  A blood clot in the lung (pulmonary embolism).  Complications from surgery, such as from open heart surgery.  Liver disease (cirrhosis).  Kidney disease. What are the signs or symptoms? In some cases, pleural effusion may cause no symptoms. If symptoms are present, they may include:  Shortness of breath, especially when lying down.  Chest pain. This may get worse when taking a deep breath.  Fever.  Dry, long-lasting (chronic) cough.  Hiccups.  Rapid breathing. An underlying condition that is causing the pleural effusion (such as heart failure, pneumonia, blood clots, tuberculosis, or cancer) may also cause other symptoms. How is this diagnosed? This condition may be diagnosed based on:  Your symptoms and medical history.  A physical exam.  A chest X-ray.  A procedure to use a needle to remove fluid from the pleural space (thoracentesis). This fluid is tested.  Other imaging studies of the chest, such as ultrasound or CT scan. How is this treated? Depending on the cause of your condition, treatment may include:  Treating the underlying condition that is causing the effusion. When that condition improves, the effusion will also improve. Examples of treatment for underlying conditions include: ? Antibiotic medicines to  treat an infection. ? Diuretics or other heart medicines to treat heart failure.  Thoracentesis.  Placing a thin flexible tube under your skin and into your chest to continuously drain the effusion (indwelling pleural catheter).  Surgery to remove the outer layer of tissue from the pleural space (decortication).  A procedure to put medicine into the chest cavity to seal the pleural space and prevent fluid buildup (pleurodesis).  Chemotherapy and radiation therapy, if you have cancerous (malignant) pleural effusion. These treatments are typically used to treat cancer. They kill certain cells in the body. Follow these instructions at home:  Take over-the-counter and prescription medicines only as told by your health care provider.  Ask your health care provider what activities are safe for you.  Keep track of how long you are able to do mild exercise (such as walking) before you get short of breath. Write down this information to share with your health care provider. Your ability to exercise should improve over time.  Do not use any products that contain nicotine or tobacco, such as cigarettes and e-cigarettes. If you need help quitting, ask your health care provider.  Keep all follow-up visits as told by your health care provider. This is important. Contact a health care provider if:  The amount of time that you are able to do mild exercise: ? Decreases. ? Does not improve with time.  You have a fever. Get help right away if:  You are short of breath.  You develop chest pain.  You develop a new cough. Summary  Pleural effusion is an abnormal buildup of fluid in the layers   of tissue between the lungs and the inside of the chest.  Pleural effusion can have many causes, including heart failure, pulmonary embolism, infections, or cancer.  Symptoms of pleural effusion can include shortness of breath, chest pain, fever, long-lasting (chronic) cough, hiccups, or rapid  breathing.  Diagnosis often involves making images of the chest (such as with ultrasound or X-ray) and removing fluid (thoracentesis) to send for testing.  Treatment for pleural effusion depends on what underlying condition is causing it. This information is not intended to replace advice given to you by your health care provider. Make sure you discuss any questions you have with your health care provider. Document Released: 11/02/2005 Document Revised: 07/08/2017 Document Reviewed: 07/08/2017 Elsevier Interactive Patient Education  2019 [employer].

## 2019-01-29 NOTE — Assessment & Plan Note (Signed)
On Levothyroxine, continue to monitor 

## 2019-01-29 NOTE — Assessment & Plan Note (Signed)
Well controlled, no changes to meds. Encouraged heart healthy diet such as the DASH diet and exercise as tolerated.  °

## 2019-01-29 NOTE — Assessment & Plan Note (Signed)
Pox 98% today. She has follow up with pulmonology soon. She is given a prescription for Albuterol HFA to use as needed.

## 2019-01-29 NOTE — Assessment & Plan Note (Signed)
Her son is with her today and he agrees manage the very small amount of Alprazolam she is given for the them to use if she has a panic or anxiety attack. If it does not help they will present for care. If she has any complications they will not administer another dose.

## 2019-01-29 NOTE — Progress Notes (Signed)
Subjective:    Patient ID: Crystal [name], female    DOB: 14-Mar-1934, 85 y.o.   MRN: [id number]  Chief Complaint  Patient presents with  . Hospitalization Follow-up    SOB follow up, Pt states still having SOB and states having hard time talking due SOB.     HPI Patient is in today for hospital follow-up.  She is accompanied by her son.  They note since she came home from her recent hospitalization she is doing better.  She has had no syncope, worsening shortness of breath febrile illness or new concerns.  She does note her neck is sore but offers no other acute complaints.  Her son notes that she does have increased anxiety whenever she gets short of breath.  When she lies down her shortness of breath is worse than when she sits up so she has not been lying down regularly. Denies CP/palp/HA/congestion/fevers/GI or GU c/o. Taking meds as prescribed  Past Medical History:  Diagnosis Date  . Anemia 08/21/2017  . Anxiety   . Cardiomyopathy    non ischemic NL cos on cath 08/2009, EF to 15-20%, Her follow up  2D echo  in 10/2009 showed impoved EFo 35-40%  . CHF (congestive heart failure) ([place])   . Depression   . Full dentures   . Hypercalcemia 04/20/2017  . Hypertension   . Hypothyroid   . Lung mass   . Medicare annual wellness visit, subsequent 12/14/2014   Follows with Dr Marin Olp Follows with Dr Deatra Ina, gastroenterology, last colonoscopy in 2012, repeat in 2017 Last Pap 2011, always normal, no need for repeat Last MGM in 2011, no concerns, declines for now Follows with Dr Stanford Breed of cardiology   . Non Hodgkin's lymphoma ([place]) 11/17/1999   -Dr. Marin Olp  . Vitamin D deficiency 10/17/2015  . Wears glasses     Past Surgical History:  Procedure Laterality Date  . DILATION AND CURETTAGE OF UTERUS    . MULTIPLE TOOTH EXTRACTIONS    . NASAL POLYP EXCISION  1962  . TONSILLECTOMY  1952  . VIDEO BRONCHOSCOPY WITH ENDOBRONCHIAL NAVIGATION Right 06/29/2018   Procedure: VIDEO BRONCHOSCOPY WITH  ENDOBRONCHIAL NAVIGATION;  Surgeon: Collene Gobble, MD;  Location: [facility];  Service: Thoracic;  Laterality: Right;  Marland Kitchen VIDEO BRONCHOSCOPY WITH ENDOBRONCHIAL ULTRASOUND Right 06/29/2018   Procedure: VIDEO BRONCHOSCOPY WITH ENDOBRONCHIAL ULTRASOUND;  Surgeon: Collene Gobble, MD;  Location: [facility];  Service: Thoracic;  Laterality: Right;    Family History  Problem Relation Age of Onset  . Diabetes Mother        deceased secondary to diabetes  . Hypertension Mother   . Vision loss Mother   . Pneumonia Father        died age 42 due to complications of pneumonia  . Colon polyps Father   . Liver disease Daughter   . Sarcoidosis Daughter   . Diabetes Sister   . Colon cancer Brother 84  . Cancer Brother   . Obesity Son   . Diabetes Brother   . Kidney disease Brother   . Diabetes Sister   . Sarcoidosis Sister   . Arthritis Sister   . Arthritis Sister   . Diabetes Sister   . Arthritis Sister   . Diabetes Sister   . Hypertension Daughter   . Hypertension Daughter   . Other Other        no obvious premature cardiovascular disease or familial cardiomyopathy is noted  . Alcohol abuse Neg Hx   .  Heart disease Neg Hx     Social History   Socioeconomic History  . Marital status: Married    Spouse name: Crystal [name]  . Number of children: 5  . Years of education: Not on file  . Highest education level: Not on file  Occupational History  . Occupation: retired  Scientific laboratory technician  . Financial resource strain: Not on file  . Food insecurity:    Worry: Not on file    Inability: Not on file  . Transportation needs:    Medical: Not on file    Non-medical: Not on file  Tobacco Use  . Smoking status: Never Smoker  . Smokeless tobacco: Never Used  Substance and Sexual Activity  . Alcohol use: Never    Alcohol/week: 0.0 standard drinks    Frequency: Never  . Drug use: Never  . Sexual activity: Yes    Comment: lives with husband, no dietary restrictions, minimizes dairy  Lifestyle  . Physical  activity:    Days per week: Not on file    Minutes per session: Not on file  . Stress: Not on file  Relationships  . Social connections:    Talks on phone: Not on file    Gets together: Not on file    Attends religious service: Not on file    Active member of club or organization: Not on file    Attends meetings of clubs or organizations: Not on file    Relationship status: Not on file  . Intimate partner violence:    Fear of current or ex partner: Not on file    Emotionally abused: Not on file    Physically abused: Not on file    Forced sexual activity: Not on file  Other Topics Concern  . Not on file  Social History Narrative   The patient is married ( husband Crystal [name])  She just recently celebrated     her 52nd anniversary.  She has five children.  There is no active     tobacco or alcohol use history.          Smoking Status:  never   Caffeine use/day:  None   Does Patient Exercise:  yes    Outpatient Medications Prior to Visit  Medication Sig Dispense Refill  . carvedilol (COREG) 6.25 MG tablet Take 1 tablet (6.25 mg total) by mouth 2 (two) times daily. 180 tablet 3  . furosemide (LASIX) 40 MG tablet Take 1 tablet (40 mg total) by mouth daily for 30 days. 30 tablet 0  . magnesium hydroxide (MILK OF MAGNESIA) 400 MG/5ML suspension Take 15 mLs by mouth daily as needed for mild constipation.    . Multiple Vitamins-Minerals (CENTRUM PO) Take 1 tablet by mouth daily.     . Omega-3 Fatty Acids (FISH OIL) 1000 MG CAPS Take 1,000 mg by mouth daily.    . Probiotic Product (PROBIOTIC DAILY PO) Take 1 capsule by mouth daily.     Marland Kitchen levothyroxine (SYNTHROID, LEVOTHROID) 25 MCG tablet TAKE 1 TABLET BY MOUTH ONCE DAILY BEFORE BREAKFAST (Patient taking differently: Take 25 mcg by mouth daily before breakfast. ) 30 tablet 0   No facility-administered medications prior to visit.     Allergies  Allergen Reactions  . Lisinopril Swelling    Facial and lip swelling  . Iohexol Itching          Review of Systems  Constitutional: Positive for malaise/fatigue. Negative for fever.  HENT: Negative for congestion.   Eyes: Negative for blurred  vision.  Respiratory: Positive for shortness of breath.   Cardiovascular: Negative for chest pain, palpitations and leg swelling.  Gastrointestinal: Negative for abdominal pain, blood in stool and nausea.  Genitourinary: Negative for dysuria and frequency.  Musculoskeletal: Positive for neck pain. Negative for falls.  Skin: Negative for rash.  Neurological: Negative for dizziness, loss of consciousness and headaches.  Endo/Heme/Allergies: Negative for environmental allergies.  Psychiatric/Behavioral: Negative for depression. The patient is nervous/anxious.        Objective:    Physical Exam Vitals signs and nursing note reviewed.  Constitutional:      General: She is not in acute distress.    Appearance: She is well-developed.  HENT:     Head: Normocephalic and atraumatic.     Nose: Nose normal.  Eyes:     General:        Right eye: No discharge.        Left eye: No discharge.  Neck:     Musculoskeletal: Normal range of motion and neck supple.  Cardiovascular:     Rate and Rhythm: Normal rate and regular rhythm.     Heart sounds: No murmur.  Pulmonary:     Effort: Pulmonary effort is normal.     Breath sounds: Normal breath sounds.  Abdominal:     General: Bowel sounds are normal.     Palpations: Abdomen is soft.     Tenderness: There is no abdominal tenderness.  Skin:    General: Skin is warm and dry.  Neurological:     Mental Status: She is alert and oriented to person, place, and time.     BP 118/75 (BP Location: Left Arm, Patient Position: Sitting, Cuff Size: Normal)   Pulse 80   Temp 97.8 F (36.6 C) (Oral)   Resp 18   Ht '5\' 3"'  (1.6 m)   Wt 130 lb 12.8 oz (59.3 kg)   SpO2 99%   BMI 23.17 kg/m  Wt Readings from Last 3 Encounters:  01/24/19 130 lb 12.8 oz (59.3 kg)  01/16/19 135 lb 2.3 oz (61.3 kg)   01/10/19 132 lb 6.4 oz (60.1 kg)     Lab Results  Component Value Date   WBC 4.3 01/13/2019   HGB 11.5 (L) 01/13/2019   HCT 36.3 01/13/2019   PLT 106 (L) 01/13/2019   GLUCOSE 108 (H) 01/16/2019   CHOL 172 08/29/2018   TRIG 98.0 08/29/2018   HDL 55.90 08/29/2018   LDLCALC 97 08/29/2018   ALT 53 (H) 01/12/2019   AST 67 (H) 01/12/2019   NA 138 01/16/2019   K 4.1 01/16/2019   CL 103 01/16/2019   CREATININE 1.38 (H) 01/16/2019   BUN 24 (H) 01/16/2019   CO2 29 01/16/2019   TSH 4.755 (H) 11/29/2018   INR 1.3 (H) 01/12/2019   HGBA1C 5.5 08/29/2018    Lab Results  Component Value Date   TSH 4.755 (H) 11/29/2018   Lab Results  Component Value Date   WBC 4.3 01/13/2019   HGB 11.5 (L) 01/13/2019   HCT 36.3 01/13/2019   MCV 89.0 01/13/2019   PLT 106 (L) 01/13/2019   Lab Results  Component Value Date   NA 138 01/16/2019   K 4.1 01/16/2019   CHLORIDE 101 01/21/2017   CO2 29 01/16/2019   GLUCOSE 108 (H) 01/16/2019   BUN 24 (H) 01/16/2019   CREATININE 1.38 (H) 01/16/2019   BILITOT 0.9 01/12/2019   ALKPHOS 103 01/12/2019   AST 67 (H) 01/12/2019   ALT 53 (  H) 01/12/2019   PROT 8.2 (H) 01/12/2019   ALBUMIN 3.0 (L) 01/12/2019   CALCIUM 9.3 01/16/2019   ANIONGAP 6 01/16/2019   EGFR 29 (L) 01/21/2017   GFR 52.68 (L) 08/29/2018   Lab Results  Component Value Date   CHOL 172 08/29/2018   Lab Results  Component Value Date   HDL 55.90 08/29/2018   Lab Results  Component Value Date   LDLCALC 97 08/29/2018   Lab Results  Component Value Date   TRIG 98.0 08/29/2018   Lab Results  Component Value Date   CHOLHDL 3 08/29/2018   Lab Results  Component Value Date   HGBA1C 5.5 08/29/2018       Assessment & Plan:   Problem List Items Addressed This Visit    Hypothyroidism    On Levothyroxine, continue to monitor      Relevant Medications   levothyroxine (SYNTHROID, LEVOTHROID) 25 MCG tablet   Anxiety state    Her son is with her today and he agrees manage  the very small amount of Alprazolam she is given for the them to use if she has a panic or anxiety attack. If it does not help they will present for care. If she has any complications they will not administer another dose.       Relevant Medications   ALPRAZolam (XANAX) 0.25 MG tablet   Essential hypertension    Well controlled, no changes to meds. Encouraged heart healthy diet such as the DASH diet and exercise as tolerated.       CKD (chronic kidney disease) stage 3, GFR 30-59 ml/min (HCC)    Hydrate and monitor.      Acute on chronic respiratory failure with hypoxemia (HCC)    Pox 98% today. She has follow up with pulmonology soon. She is given a prescription for Albuterol HFA to use as needed.        Other Visit Diagnoses    Pleural effusion    -  Primary   Relevant Orders   DG Chest 2 View (Completed)      I have changed Crystal [name]'s levothyroxine. I am also having her start on benzonatate, albuterol, and ALPRAZolam. Additionally, I am having her maintain her Multiple Vitamins-Minerals (CENTRUM PO), Probiotic Product (PROBIOTIC DAILY PO), carvedilol, Fish Oil, magnesium hydroxide, and furosemide.  Meds ordered this encounter  Medications  . benzonatate (TESSALON) 100 MG capsule    Sig: Take 1-2 capsules (100-200 mg total) by mouth 3 (three) times daily as needed for cough.    Dispense:  60 capsule    Refill:  1  . albuterol (PROVENTIL HFA;VENTOLIN HFA) 108 (90 Base) MCG/ACT inhaler    Sig: Inhale 1-2 puffs into the lungs every 4 (four) hours as needed for wheezing or shortness of breath.    Dispense:  1 Inhaler    Refill:  2  . ALPRAZolam (XANAX) 0.25 MG tablet    Sig: Take 1 tablet (0.25 mg total) by mouth 2 (two) times daily as needed for anxiety.    Dispense:  30 tablet    Refill:  1  . levothyroxine (SYNTHROID, LEVOTHROID) 25 MCG tablet    Sig: Take 1 tablet (25 mcg total) by mouth daily before breakfast.    Dispense:  90 tablet    Refill:  1     Penni Homans, MD

## 2019-01-29 NOTE — Assessment & Plan Note (Signed)
Hydrate and monitor 

## 2019-01-30 ENCOUNTER — Encounter: Payer: Self-pay | Admitting: Acute Care

## 2019-01-30 ENCOUNTER — Ambulatory Visit (INDEPENDENT_AMBULATORY_CARE_PROVIDER_SITE_OTHER): Payer: Medicare Other | Admitting: Acute Care

## 2019-01-30 ENCOUNTER — Other Ambulatory Visit: Payer: Medicare Other

## 2019-01-30 ENCOUNTER — Other Ambulatory Visit: Payer: Self-pay

## 2019-01-30 DIAGNOSIS — J984 Other disorders of lung: Secondary | ICD-10-CM

## 2019-01-30 DIAGNOSIS — R911 Solitary pulmonary nodule: Secondary | ICD-10-CM | POA: Diagnosis not present

## 2019-01-30 DIAGNOSIS — I509 Heart failure, unspecified: Secondary | ICD-10-CM | POA: Diagnosis not present

## 2019-01-30 DIAGNOSIS — J189 Pneumonia, unspecified organism: Secondary | ICD-10-CM

## 2019-01-30 MED ORDER — FLUTTER DEVI: 0 refills | Status: DC

## 2019-01-30 MED ORDER — AEROCHAMBER MV MISC: 0 refills | Status: DC

## 2019-01-30 NOTE — Patient Instructions (Addendum)
It is nice to meet you today.  We will give you a spacer to use with your albuterol inhaler. Continue to use your albuterol inhaler as needed for shortness of breath or wheezing up to 3 times daily We will teach you how to use it today. Continue your lasix and Coreg as prescribed to continue to control your heart failure. Please call if you notice swelling in your feet so we can be proactive treating you. Follow up CXR in 1 month ( No appointment needed) We will call you with results ( Follow up pleural effusions)  Follow up [facility] with [facility] 3/25 as ordered. Follow up with Dr. Marin Olp as scheduled. Follow up CT without contrast in 3 months, follow up cavitary lesion. Follow up appointment with Dr. Lamonte Sakai, or Chris Cripps NP  in 3 months Please contact office for sooner follow up if symptoms do not improve or worsen or seek emergency care

## 2019-01-30 NOTE — Assessment & Plan Note (Signed)
Negative for malignancy 06/2018 Needs continued follow up Plan: Follow up with Dr. Marin Olp as scheduled. We will check with him about when he wants follow up imaging of the pulmonary nodule Follow up CT without contrast in 3 months, follow up cavitary lesion. Follow up appointment with Dr. Lamonte Sakai, or Ephraim Reichel NP  in 3 months Please contact office for sooner follow up if symptoms do not improve or worsen or seek emergency care

## 2019-01-30 NOTE — Progress Notes (Signed)
History of Present Illness Crystal [name] is a 85 y.o. female with systolic heart Failure, EF of 35-40%, CABG, cavitary lung mass in the left apical lobe, chronic kidney disease stage III, and Hx of non-Hodgkin's lymphoma. She was seen for biopsy 06/2018 by Dr. Lamonte Sakai   01/30/2019 Hospital Follow Up Pt was recently hospitalized from 01/12/2019-01/16/2019 with dyspnea. She has medical history significant for CABG, cavitary lung mass in the left apical lobe, chronic kidney disease stage III, and Hx of non-Hodgkin's lymphoma. She had a previous recent hospitalization that required treatment for volume overload related to heart failure and right upper lobe pneumonia. She presented to the ED with sats of 60%. She was admitted and treated for acute on chronic hypoxic respiratory failure 2/2 acute on chronic heart failure. She was treated with IV Diuretics , negative fluid balance was achieved, she was weaned from non-invasive mechanical ventilation. Additionally her pneumonia was treated with antibiotics.At discharge she was saturating 100% on 2 L [place]. Heart failure was managed with carvedilol. Her left cavitary apical lesion  multiple lung nodules, biopsy per bronchoscopy was negative for malignancy (August 2019). CT chest from January 10, 2019 showed persistent cavitary mass within the left apex, slightly decreased in size.  Positive bilateral pulmonary nodules. Right apical pneumonia was improving at discharge. She is here for follow up.   Pt. States she has been doing well She has not needed any oxygen. Saturations today on RA are 96%. She has been taking  xanax occasionally for anxiety.She is using her albuterol inhaler as needed. She states she has been compliant with her Lasix and her Coreg. She denies any swelling of her feet, or shortness of breath. She is following a low salt diet. She has minimal cough. She had CXR per her PCP 1 week ago which confirmed less edema, and a small persistent effusion on the  left. She denies any fever, chest pain, orthopnea or hemoptysis.She is practicing social distancing.  Test Results:  CXR 01/24/2019 Radiographic improvement with less edema. Small persistent effusion on the left with left base atelectasis. Persistent perihilar atelectasis.   CBC Latest Ref Rng & Units 01/13/2019 01/12/2019 01/12/2019  WBC 4.0 - 10.5 K/uL 4.3 - 5.7  Hemoglobin 12.0 - 15.0 g/dL 11.5(L) 11.6(L) 11.7(L)  Hematocrit 36.0 - 46.0 % 36.3 34.0(L) 38.2  Platelets 150 - 400 K/uL 106(L) - 156    BMP Latest Ref Rng & Units 01/16/2019 01/15/2019 01/14/2019  Glucose 70 - 99 mg/dL 108(H) 131(H) 95  BUN 8 - 23 mg/dL 24(H) 27(H) 21  Creatinine 0.44 - 1.00 mg/dL 1.38(H) 1.59(H) 1.52(H)  BUN/Creat Ratio 12 - 28 - - -  Sodium 135 - 145 mmol/L 138 136 139  Potassium 3.5 - 5.1 mmol/L 4.1 4.2 3.1(L)  Chloride 98 - 111 mmol/L 103 104 102  CO2 22 - 32 mmol/L 29 25 27   Calcium 8.9 - 10.3 mg/dL 9.3 9.6 9.4    BNP    Component Value Date/Time   BNP 3,955.3 (H) 01/12/2019 1701   BNP 325.0 (H) 10/21/2009 0808    ProBNP    Component Value Date/Time   PROBNP 229.8 (H) 01/09/2010 0000    PFT No results found for: FEV1PRE, FEV1POST, FVCPRE, FVCPOST, TLC, DLCOUNC, PREFEV1FVCRT, PSTFEV1FVCRT  Dg Chest 2 View  Result Date: 01/25/2019 CLINICAL DATA:  Shortness of breath. Pleural effusion. Hypertension and congestive heart failure. EXAM: CHEST - 2 VIEW COMPARISON:  01/15/2019 FINDINGS: Radiographic improvement. Persistent cardiomegaly. Small effusion on the left with basilar atelectasis.  Resolution of edema pattern. Some persistent perihilar atelectasis. No worsening or new finding. IMPRESSION: Radiographic improvement with less edema. Small persistent effusion on the left with left base atelectasis. Persistent perihilar atelectasis. Electronically Signed   By: Nelson Chimes M.D.   On: 01/25/2019 10:41   Ct Chest Wo Contrast  Result Date: 01/10/2019 CLINICAL DATA:  Followup cavitary lung mass.  EXAM: CT CHEST WITHOUT CONTRAST TECHNIQUE: Multidetector CT imaging of the chest was performed following the standard protocol without IV contrast. COMPARISON:  06/24/2018 FINDINGS: Cardiovascular: The heart size is mildly enlarged. Aortic atherosclerosis identified. Calcification in the left circumflex coronary artery noted. Mediastinum/Nodes: Calcified nodule in inferior pole of left lobe of thyroid gland is again noted. The trachea appears patent. Unremarkable appearance of the esophagus. Extensive partially calcified mediastinal and hilar lymphadenopathy is again noted. -index pre-vascular lymph node measures 1.2 cm, image 38/2. Previously 1.4 cm. -index right paratracheal lymph node measures 1.6 cm, image 42/2. Unchanged. -the index subcarinal lymph node measures 2 cm, image 55/2. Previously 2.1 cm. -the index right hilar lymph node measures 2.9 cm, image 61/2. Stable. -left hilar lymph node measures 2.7 cm, image 54/2. Previously 2.9 cm. Lungs/Pleura: New moderate bilateral pleural effusions are identified. There is new partial atelectasis of the right middle lobe and lingula. Also new from previous exam is diffuse asymmetric ground-glass attenuation, thickening of the pulmonary interstitium and airspace consolidation within the right upper lobe, image 34/3. The left upper lobe cavitary lung mass is again noted. On today's exam this measures 3.7 by 2.7 cm, image 15/2. Previously 4.2 x 3.4 cm. Right lung perifissural and subpleural nodularity is again noted. Index nodule within the superior segment of right lower lobe measures 1.5 cm, image 53/3. Previously 1.3 cm. Index nodule within the posterior right middle lobe measures 1.4 cm, image 54/3. Unchanged. Subpleural nodule within the anterolateral right lower lobe measures 4 mm, image 69/3. Previously 3 mm. The solid round peribronchovascular nodule in the right lower lobe measures 1 cm, image 73/3. Unchanged from previous exam. The subpleural nodule within the  anterolateral left lower lobe measures 0.9 cm, image 54/3. Unchanged. Upper Abdomen: No acute abnormality Musculoskeletal: No chest wall mass or suspicious bone lesions identified. IMPRESSION: 1. The cavitary mass within the left apex is again noted and is slightly decreased in size from previous exam. 2. Additional scattered subpleural, perifissural and peribronchovascular nodules in both lungs are again noted and are not significantly changed in the interval. 3. Interval development of moderate bilateral pleural effusions, right middle lobe and lingular partial atelectasis, and diffuse ground-glass attenuation, interstitial thickening and airspace consolidation in the right upper lobe. Imaging findings are concerning for superimposed pneumonia and possible CHF. 4. Stable appearance of enlarged and partially calcified mediastinal lymph nodes. 5. Aortic atherosclerosis and coronary artery calcification. Aortic Atherosclerosis (ICD10-I70.0). Electronically Signed   By: Kerby Moors M.D.   On: 01/10/2019 13:37   Dg Chest Port 1 View  Result Date: 01/15/2019 CLINICAL DATA:  Shortness of breath. EXAM: PORTABLE CHEST 1 VIEW COMPARISON:  01/14/2019; 01/12/2019; 11/29/2018; chest CT-01/10/2019 FINDINGS: Grossly unchanged cardiac silhouette and mediastinal contours with atherosclerotic plaque within the thoracic aorta. Grossly unchanged small to moderate-sized bilateral effusions and associated bibasilar heterogeneous/consolidative opacities, greater than right. Right upper lung volume loss and associated bilateral perihilar interstitial opacities are unchanged. Right upper lobe nodular opacities and associated biapical pleuroparenchymal thickening are also unchanged. The pulmonary vasculature appears less distinct than present examination with cephalization flow. No new focal airspace opacities. No pneumothorax.  No acute osseous abnormalities. IMPRESSION: 1. Suspected development of pulmonary edema superimposed  advanced bilateral parenchymal abnormalities. 2. Unchanged small to moderate-sized bilateral effusions. Electronically Signed   By: Sandi Mariscal M.D.   On: 01/15/2019 07:39   Dg Chest Port 1 View  Result Date: 01/14/2019 CLINICAL DATA:  Dyspnea.  History of lung mass. EXAM: PORTABLE CHEST 1 VIEW COMPARISON:  01/12/2019 and older exams. FINDINGS: Bilateral upper lobe opacities are stable. Lung base opacities and pleural effusions are also unchanged. No new lung abnormalities. No pneumothorax. IMPRESSION: 1. No change from the previous day's study. 2. Persistent upper lobe interstitial and airspace opacities. Cavitary mass in the left apex noted on the prior CT is not well-defined, but is unchanged radiographically. Persistent pleural effusions. No new lung abnormalities. Electronically Signed   By: Lajean Manes M.D.   On: 01/14/2019 09:04   Dg Chest Port 1 View  Result Date: 01/12/2019 CLINICAL DATA:  Shortness of breath EXAM: PORTABLE CHEST 1 VIEW COMPARISON:  11/29/2018 FINDINGS: Cardiomegaly. Perihilar and lower lobe airspace opacities are noted. Suspect small left effusion. No acute bony abnormality. IMPRESSION: Perihilar opacities and bibasilar opacities, left greater than right. This could reflect atelectasis or infiltrates. Cardiomegaly, vascular congestion Electronically Signed   By: Rolm Baptise M.D.   On: 01/12/2019 17:47     Past medical hx Past Medical History:  Diagnosis Date   Anemia 08/21/2017   Anxiety    Cardiomyopathy    non ischemic NL cos on cath 08/2009, EF to 15-20%, Her follow up  2D echo  in 10/2009 showed impoved EFo 35-40%   CHF (congestive heart failure) ([place])    Depression    Full dentures    Hypercalcemia 04/20/2017   Hypertension    Hypothyroid    Lung mass    Medicare annual wellness visit, subsequent 12/14/2014   Follows with Dr Marin Olp Follows with Dr Deatra Ina, gastroenterology, last colonoscopy in 2012, repeat in 2017 Last Pap 2011, always normal, no  need for repeat Last MGM in 2011, no concerns, declines for now Follows with Dr Stanford Breed of cardiology    Non Hodgkin's lymphoma [facility] 11/17/1999   -Dr. Marin Olp   Vitamin D deficiency 10/17/2015   Wears glasses      Social History   Tobacco Use   Smoking status: Never Smoker   Smokeless tobacco: Never Used  Substance Use Topics   Alcohol use: Never    Alcohol/week: 0.0 standard drinks    Frequency: Never   Drug use: Never    Ms.Yambao reports that she has never smoked. She has never used smokeless tobacco. She reports that she does not drink alcohol or use drugs.  Tobacco Cessation: Never smoker  Past surgical hx, Family hx, Social hx all reviewed.  Current Outpatient Medications on File Prior to Visit  Medication Sig   albuterol (PROVENTIL HFA;VENTOLIN HFA) 108 (90 Base) MCG/ACT inhaler Inhale 1-2 puffs into the lungs every 4 (four) hours as needed for wheezing or shortness of breath.   ALPRAZolam (XANAX) 0.25 MG tablet Take 1 tablet (0.25 mg total) by mouth 2 (two) times daily as needed for anxiety.   benzonatate (TESSALON) 100 MG capsule Take 1-2 capsules (100-200 mg total) by mouth 3 (three) times daily as needed for cough.   carvedilol (COREG) 6.25 MG tablet Take 1 tablet (6.25 mg total) by mouth 2 (two) times daily.   furosemide (LASIX) 40 MG tablet Take 1 tablet (40 mg total) by mouth daily for 30 days.   levothyroxine (SYNTHROID,  LEVOTHROID) 25 MCG tablet Take 1 tablet (25 mcg total) by mouth daily before breakfast.   magnesium hydroxide (MILK OF MAGNESIA) 400 MG/5ML suspension Take 15 mLs by mouth daily as needed for mild constipation.   Multiple Vitamins-Minerals (CENTRUM PO) Take 1 tablet by mouth daily.    Omega-3 Fatty Acids (FISH OIL) 1000 MG CAPS Take 1,000 mg by mouth daily.   Probiotic Product (PROBIOTIC DAILY PO) Take 1 capsule by mouth daily.    No current facility-administered medications on file prior to visit.      Allergies  Allergen  Reactions   Lisinopril Swelling    Facial and lip swelling   Iohexol Itching         Review Of Systems:  Constitutional:   No  weight loss, night sweats,  Fevers, chills, fatigue, or  lassitude.  HEENT:   No headaches,  Difficulty swallowing,  Tooth/dental problems, or  Sore throat,                No sneezing, itching, ear ache, nasal congestion, post nasal drip,   CV:  No chest pain,  Orthopnea, PND, swelling in lower extremities, anasarca, dizziness, palpitations, syncope.   GI  No heartburn, indigestion, abdominal pain, nausea, vomiting, diarrhea, change in bowel habits, loss of appetite, bloody stools.   Resp: No shortness of breath with exertion or at rest.  No excess mucus, no productive cough,  Occasional  non-productive cough,  No coughing up of blood.  No change in color of mucus.  No wheezing.  No chest wall deformity  Skin: no rash or lesions.  GU: no dysuria, change in color of urine, no urgency or frequency.  No flank pain, no hematuria   MS:  No joint pain or swelling.  No decreased range of motion.  No back pain.  Psych:  No change in mood or affect. No depression or anxiety.  No memory loss.   Vital Signs BP 102/60 (BP Location: Left Arm, Cuff Size: Normal)    Pulse 86    Ht 5\' 3"  (1.6 m)    Wt 129 lb (58.5 kg)    SpO2 96%    BMI 22.85 kg/m    Physical Exam:  General- No distress,  A&Ox3, pleasant ENT: No sinus tenderness, TM clear, pale nasal mucosa, no oral exudate,no post nasal drip, no LAN Cardiac: S1, S2, regular rate and rhythm, no murmur Chest: No wheeze/ rales/ dullness; no accessory muscle use, no nasal flaring, no sternal retractions, slightly diminished per bases Abd.: Soft Non-tender, ND, BS + Ext: No clubbing cyanosis, edema Neuro:  Frail , uses a wheelchair at times, MAE x 4, A&O x 3 Skin: No rashes, warm and dry Psych: normal mood and behavior   Assessment/Plan  Congestive heart failure (HCC) Compliant with Lasix and  Coreg Plan: Follow up with Dr. Stanford Breed as is scheduled Continue lasix and coreg daily Call Dr. Jacalyn Lefevre office for swelling in the legs Weigh yourself daily Follow low salt diet CXR in 1 month, FU effusion Labs as ordered Please contact office for sooner follow up if symptoms do not improve or worsen or seek emergency care   CAP (community acquired pneumonia) Resolving per CXR 3/10 Plan We will give you a spacer to use with your albuterol inhaler. Continue to use your albuterol inhaler as needed for shortness of breath or wheezing up to 3 times daily We will teach you how to use it today. Follow up CXR in 1 month ( No appointment needed)  We will call you with results ( Follow up pleural effusions)  Follow up labs with Dr.Blyth 3/25 as ordered. Follow up with Dr. Marin Olp as scheduled. Follow up CT without contrast in 3 months, follow up cavitary lesion. Follow up appointment with Dr. Lamonte Sakai, or Ezana Hubbert NP  in 3 months Please contact office for sooner follow up if symptoms do not improve or worsen or seek emergency care    Cavitating mass in left upper lung lobe Negative for malignancy 06/2018 Needs continued follow up Plan: Follow up with Dr. Marin Olp as scheduled. We will check with him about when he wants follow up imaging of the pulmonary nodule Follow up CT without contrast in 3 months, follow up cavitary lesion. Follow up appointment with Dr. Lamonte Sakai, or Deadra Diggins NP  in 3 months Please contact office for sooner follow up if symptoms do not improve or worsen or seek emergency care      Magdalen Spatz, NP 01/30/2019  5:08 PM

## 2019-01-30 NOTE — Assessment & Plan Note (Addendum)
Resolving per CXR 3/10 Plan We will give you a spacer to use with your albuterol inhaler. Continue to use your albuterol inhaler as needed for shortness of breath or wheezing up to 3 times daily We will teach you how to use it today. Follow up CXR in 1 month ( No appointment needed) We will call you with results ( Follow up pleural effusions)  Follow up labs with [facility] 3/25 as ordered. Follow up with Dr. Marin Olp as scheduled. Follow up CT without contrast in 3 months, follow up cavitary lesion. Follow up appointment with Dr. Lamonte Sakai, or Sarah NP  in 3 months Please contact office for sooner follow up if symptoms do not improve or worsen or seek emergency care

## 2019-01-30 NOTE — Assessment & Plan Note (Addendum)
Compliant with Lasix and Coreg Plan: Follow up with Dr. Stanford Breed as is scheduled Continue lasix and coreg daily Call Dr. Jacalyn Lefevre office for swelling in the legs Weigh yourself daily Follow low salt diet CXR in 1 month, FU effusion Labs as ordered Please contact office for sooner follow up if symptoms do not improve or worsen or seek emergency care

## 2019-02-08 ENCOUNTER — Encounter: Payer: Self-pay | Admitting: Hematology & Oncology

## 2019-02-08 ENCOUNTER — Other Ambulatory Visit: Payer: Self-pay

## 2019-02-08 ENCOUNTER — Inpatient Hospital Stay: Payer: Medicare Other | Attending: Hematology & Oncology

## 2019-02-08 ENCOUNTER — Inpatient Hospital Stay (HOSPITAL_BASED_OUTPATIENT_CLINIC_OR_DEPARTMENT_OTHER): Payer: Medicare Other | Admitting: Hematology & Oncology

## 2019-02-08 DIAGNOSIS — Z8572 Personal history of non-Hodgkin lymphomas: Secondary | ICD-10-CM | POA: Insufficient documentation

## 2019-02-08 DIAGNOSIS — R531 Weakness: Secondary | ICD-10-CM

## 2019-02-08 DIAGNOSIS — R911 Solitary pulmonary nodule: Secondary | ICD-10-CM

## 2019-02-08 DIAGNOSIS — R0602 Shortness of breath: Secondary | ICD-10-CM | POA: Insufficient documentation

## 2019-02-08 DIAGNOSIS — C8331 Diffuse large B-cell lymphoma, lymph nodes of head, face, and neck: Secondary | ICD-10-CM

## 2019-02-08 LAB — CMP (CANCER CENTER ONLY)
ALK PHOS: 121 U/L (ref 38–126)
ALT: 26 U/L (ref 0–44)
ANION GAP: 9 (ref 5–15)
AST: 25 U/L (ref 15–41)
Albumin: 3.4 g/dL — ABNORMAL LOW (ref 3.5–5.0)
BUN: 20 mg/dL (ref 8–23)
CO2: 29 mmol/L (ref 22–32)
Calcium: 10.3 mg/dL (ref 8.9–10.3)
Chloride: 102 mmol/L (ref 98–111)
Creatinine: 1.33 mg/dL — ABNORMAL HIGH (ref 0.44–1.00)
GFR, Est AFR Am: 43 mL/min — ABNORMAL LOW (ref >60)
GFR, Estimated: 37 mL/min — ABNORMAL LOW (ref >60)
Glucose, Bld: 117 mg/dL — ABNORMAL HIGH (ref 70–99)
Potassium: 4.3 mmol/L (ref 3.5–5.1)
SODIUM: 140 mmol/L (ref 135–145)
Total Bilirubin: 0.6 mg/dL (ref 0.3–1.2)
Total Protein: 8.4 g/dL — ABNORMAL HIGH (ref 6.5–8.1)

## 2019-02-08 LAB — CBC WITH DIFFERENTIAL (CANCER CENTER ONLY)
Abs Immature Granulocytes: 0 10*3/uL (ref 0.00–0.07)
Basophils Absolute: 0 10*3/uL (ref 0.0–0.1)
Basophils Relative: 1 %
Eosinophils Absolute: 0.1 10*3/uL (ref 0.0–0.5)
Eosinophils Relative: 3 %
HCT: 39.2 % (ref 36.0–46.0)
HEMOGLOBIN: 12.4 g/dL (ref 12.0–15.0)
Immature Granulocytes: 0 %
LYMPHS PCT: 45 %
Lymphs Abs: 1.4 10*3/uL (ref 0.7–4.0)
MCH: 28.6 pg (ref 26.0–34.0)
MCHC: 31.6 g/dL (ref 30.0–36.0)
MCV: 90.5 fL (ref 80.0–100.0)
Monocytes Absolute: 0.4 10*3/uL (ref 0.1–1.0)
Monocytes Relative: 13 %
Neutro Abs: 1.2 10*3/uL — ABNORMAL LOW (ref 1.7–7.7)
Neutrophils Relative %: 38 %
Platelet Count: 144 10*3/uL — ABNORMAL LOW (ref 150–400)
RBC: 4.33 MIL/uL (ref 3.87–5.11)
RDW: 14.1 % (ref 11.5–15.5)
WBC: 3.1 10*3/uL — AB (ref 4.0–10.5)
nRBC: 0 % (ref 0.0–0.2)

## 2019-02-08 LAB — LACTATE DEHYDROGENASE: LDH: 309 U/L — ABNORMAL HIGH (ref 98–192)

## 2019-02-08 NOTE — Progress Notes (Signed)
Hematology and Oncology Follow Up Visit  Crystal [name] [id number] 1934/08/23 85 y.o. 02/08/2019   Principle Diagnosis:  Diffuse large cell non-Hodgkin's lymphoma-remission LEFT Lung mass  Current Therapy:   Observation   Interim History:  Ms. Urquiza is here today for follow-up.  We have been trying her best to get her to thoracic surgery so they can help [place] out with his left upper lung mass.  She saw pulmonary medicine recently.  They did a CT scan on her.  Thankfully, this mass in the left upper lung has not grown.  And now measures 3.7 x 2.7 cm.  She does have some lung nodules associated with this.  She does have some stable hilar mediastinal adenopathy.  She is weak.  She gets short of breath pretty easily.  She now agrees to go to see Dr. Roxan Hockey.  Hopefully, he can help [place] out so that we can know what is going on in her lungs.  I doubt that this is lymphoma.  On my treatment sure if it is lung cancer.  The fact that it has not grown is very reassuring.  She has had no cough.  There is been no fever.  She has had no nausea or vomiting.  There is been no change in bowel or bladder habits.  Overall, I was there performed status is ECOG 2.    Medications:  Allergies as of 02/08/2019      Reactions   Lisinopril Swelling   Facial and lip swelling   Iohexol Itching         Medication List       Accurate as of February 08, 2019  1:32 PM. Always use your most recent med list.        AeroChamber MV inhaler Use as instructed   albuterol 108 (90 Base) MCG/ACT inhaler Commonly known as:  PROVENTIL HFA;VENTOLIN HFA Inhale 1-2 puffs into the lungs every 4 (four) hours as needed for wheezing or shortness of breath.   ALPRAZolam 0.25 MG tablet Commonly known as:  XANAX Take 1 tablet (0.25 mg total) by mouth 2 (two) times daily as needed for anxiety.   benzonatate 100 MG capsule Commonly known as:  TESSALON Take 1-2 capsules (100-200 mg total) by mouth 3 (three) times daily  as needed for cough.   carvedilol 6.25 MG tablet Commonly known as:  COREG Take 1 tablet (6.25 mg total) by mouth 2 (two) times daily.   CENTRUM PO Take 1 tablet by mouth daily.   Fish Oil 1000 MG Caps Take 1,000 mg by mouth daily.   furosemide 40 MG tablet Commonly known as:  LASIX Take 1 tablet (40 mg total) by mouth daily for 30 days.   levothyroxine 25 MCG tablet Commonly known as:  SYNTHROID, LEVOTHROID Take 1 tablet (25 mcg total) by mouth daily before breakfast.   magnesium hydroxide 400 MG/5ML suspension Commonly known as:  MILK OF MAGNESIA Take 15 mLs by mouth daily as needed for mild constipation.   PROBIOTIC DAILY PO Take 1 capsule by mouth daily.       Allergies:  Allergies  Allergen Reactions  . Lisinopril Swelling    Facial and lip swelling  . Iohexol Itching         Past Medical History, Surgical history, Social history, and Family History were reviewed and updated.  Review of Systems: Review of Systems  Constitutional: Positive for malaise/fatigue.  HENT: Negative.   Eyes: Negative.   Respiratory: Positive for shortness of breath  and wheezing.   Cardiovascular: Positive for chest pain.  Gastrointestinal: Negative.   Genitourinary: Negative.   Musculoskeletal: Positive for myalgias.  Skin: Negative.   Neurological: Negative.   Endo/Heme/Allergies: Negative.   Psychiatric/Behavioral: Negative.       Physical Exam:  weight is 130 lb 1.9 oz (59 kg). Her oral temperature is 97.6 F (36.4 C). Her blood pressure is 115/78 and her pulse is 89. Her respiration is 20 and oxygen saturation is 100%.   Wt Readings from Last 3 Encounters:  02/08/19 130 lb 1.9 oz (59 kg)  01/30/19 129 lb (58.5 kg)  01/24/19 130 lb 12.8 oz (59.3 kg)    Physical Exam Vitals signs reviewed.  HENT:     Head: Normocephalic and atraumatic.  Eyes:     Pupils: Pupils are equal, round, and reactive to light.  Neck:     Musculoskeletal: Normal range of motion.   Cardiovascular:     Rate and Rhythm: Normal rate and regular rhythm.     Heart sounds: Normal heart sounds.     Comments: Card exam is regular rate and rhythm.  There may be some increase in rate.  There is no murmurs, rubs or bruits. Pulmonary:     Effort: Pulmonary effort is normal.     Breath sounds: Normal breath sounds.     Comments: Lung exam shows decent breath sounds bilaterally.  There is occasional wheezing.  There might be some slight decrease of breath sounds over on the left lung. Abdominal:     General: Bowel sounds are normal.     Palpations: Abdomen is soft.  Musculoskeletal: Normal range of motion.        General: No tenderness or deformity.  Lymphadenopathy:     Cervical: No cervical adenopathy.  Skin:    General: Skin is warm and dry.     Findings: No erythema or rash.  Neurological:     Mental Status: She is alert and oriented to person, place, and time.  Psychiatric:        Behavior: Behavior normal.        Thought Content: Thought content normal.        Judgment: Judgment normal.      Lab Results  Component Value Date   WBC 3.1 (L) 02/08/2019   HGB 12.4 02/08/2019   HCT 39.2 02/08/2019   MCV 90.5 02/08/2019   PLT 144 (L) 02/08/2019   No results found for: FERRITIN, IRON, TIBC, UIBC, IRONPCTSAT Lab Results  Component Value Date   RETICCTPCT 1.1 10/15/2011   RBC 4.33 02/08/2019   RETICCTABS 49.7 10/15/2011   No results found for: KPAFRELGTCHN, LAMBDASER, KAPLAMBRATIO No results found for: IGGSERUM, IGA, IGMSERUM No results found for: Odetta Pink, SPEI   Chemistry      Component Value Date/Time   NA 140 02/08/2019 1146   NA 138 12/26/2018 1557   NA 138 01/21/2017 1021   K 4.3 02/08/2019 1146   K 5.1 01/21/2017 1021   CL 102 02/08/2019 1146   CO2 29 02/08/2019 1146   CO2 30 (H) 01/21/2017 1021   BUN 20 02/08/2019 1146   BUN 26 12/26/2018 1557   BUN 30.0 (H) 01/21/2017 1021    CREATININE 1.33 (H) 02/08/2019 1146   CREATININE 1.8 (H) 01/21/2017 1021      Component Value Date/Time   CALCIUM 10.3 02/08/2019 1146   CALCIUM 11.1 (H) 01/21/2017 1021   ALKPHOS 121 02/08/2019 1146   ALKPHOS 124  01/21/2017 1021   AST 25 02/08/2019 1146   AST 27 01/21/2017 1021   ALT 26 02/08/2019 1146   ALT 25 01/21/2017 1021   BILITOT 0.6 02/08/2019 1146   BILITOT 0.50 01/21/2017 1021       Impression and Plan: Ms. Usman is a very pleasant 85 yo African American female with history of diffuse large cell non-Hodgkin lymphoma diagnosed and treated over 18 years ago. So far there has been no evidence of recurrence.    Again, our question is what is going on with her lungs right now.  We really need to figure this out.  Hopefully, she will make it to Dr. Leonarda Salon office now.  I am not sure if he would do a mediastinoscopy on her.  Maybe he will biopsy 1 of these lung lesions.  I probably will get her back to see [place] in about 6 weeks.    This is been a very complicated situation.  We have tried our best to get her to the thoracic surgeons.  Again she says she had issues with co-pays and medical bills.  However, she promises to go now.  I spent about 1/2-hour with her.  All time spent face-to-face with her.  I explained to her what was going on in her lungs.  We have to know what is going on.  Volanda Napoleon, MD 3/25/20201:32 PM

## 2019-02-08 NOTE — Addendum Note (Signed)
Addended by: Volanda Napoleon on: 02/08/2019 04:27 PM   Modules accepted: Orders

## 2019-02-14 ENCOUNTER — Telehealth: Payer: Self-pay | Admitting: *Deleted

## 2019-02-14 NOTE — Telephone Encounter (Signed)
Call received from patient's daughter stating that she would like for Dr. Marin Olp to reschedule pt.'s appt with Dr. Roxan Hockey that pt had previously canceled.  Instructed pt.'s daughter to call Dr. Leonarda Salon office back to reschedule appt.  Dr. Leonarda Salon office number given to patient's daughter.  Pt.'s daughter appreciative of assistance.

## 2019-02-20 ENCOUNTER — Other Ambulatory Visit: Payer: Self-pay

## 2019-02-21 ENCOUNTER — Encounter: Payer: Medicare Other | Admitting: Thoracic Surgery (Cardiothoracic Vascular Surgery)

## 2019-02-21 ENCOUNTER — Institutional Professional Consult (permissible substitution) (INDEPENDENT_AMBULATORY_CARE_PROVIDER_SITE_OTHER): Payer: Medicare Other | Admitting: Thoracic Surgery (Cardiothoracic Vascular Surgery)

## 2019-02-21 ENCOUNTER — Other Ambulatory Visit: Payer: Self-pay | Admitting: *Deleted

## 2019-02-21 ENCOUNTER — Encounter: Payer: Self-pay | Admitting: Thoracic Surgery (Cardiothoracic Vascular Surgery)

## 2019-02-21 VITALS — BP 116/78 | HR 84 | Temp 97.1°F | Resp 18 | Ht 63.0 in | Wt 130.0 lb

## 2019-02-21 DIAGNOSIS — R590 Localized enlarged lymph nodes: Secondary | ICD-10-CM | POA: Diagnosis not present

## 2019-02-21 DIAGNOSIS — R918 Other nonspecific abnormal finding of lung field: Secondary | ICD-10-CM | POA: Diagnosis not present

## 2019-02-21 DIAGNOSIS — J90 Pleural effusion, not elsewhere classified: Secondary | ICD-10-CM

## 2019-02-21 DIAGNOSIS — Z8572 Personal history of non-Hodgkin lymphomas: Secondary | ICD-10-CM | POA: Diagnosis not present

## 2019-02-21 DIAGNOSIS — Z01818 Encounter for other preprocedural examination: Secondary | ICD-10-CM

## 2019-02-21 NOTE — Progress Notes (Signed)
PCP is Mosie Lukes, MD Referring Provider is Volanda Napoleon, MD  Chief Complaint  Patient presents with  . Lung Mass    LUL...PET  06/06/18  . Lung Lesion  . Adenopathy    HILAR    HPI: Crystal [name] is sent for consultation regarding a left upper lobe cavitary mass and adenopathy.  Crystal [name] is an 85 year old woman with a past medical history significant for non-Hodgkin's lymphoma, nonischemic cardiomyopathy, chronic systolic left heart failure, hypertension, hypothyroidism, anxiety, depression, anemia, hypothyroidism.  In July 2019 she presented with neck pain.  As part of her work-up she had a CT of the neck.  She was seen to have a left apical abnormality.  A CT of the chest showed a 3.9 x 2.9 cm cavitary lesion in the left apex.  There were multiple other nodules and also bilateral hilar and mediastinal adenopathy.  A PET/CT showed these areas were markedly hypermetabolic.  Dr. Lamonte Sakai did bronchoscopy and endobronchial ultrasound.  All of the samples returned benign.  She apparently was referred to me around that time, but did not come for a visit due to the number of bills that she was receiving.  She was lost to follow-up for several months.  In January she was admitted after a near syncopal episode.  She was in decompensated heart failure.  A follow-up CT of the chest was done in February.  The left apical mass and adenopathy were unchanged to slightly smaller.  She did have bilateral pleural effusions, partial atelectasis in the right middle lobe and lingula and diffuse groundglass attenuation and airspace consolidation in the right upper lobe.  She denies any change in appetite or weight loss.  She denies any fevers, chills, or night sweats.  She does complain of shortness of breath both at rest and with minimal exertion.  She gets short of breath with talking.  She has had worsening leg swelling.  She has a frequent nonproductive cough.  Crystal [name] Score: At the time of surgery this  patient's most appropriate activity status/level should be described as: []     0    Normal activity, no symptoms []     1    Restricted in physical strenuous activity but ambulatory, able to do out light work [x]     2    Ambulatory and capable of self care, unable to do work activities, up and about >50 % of waking hours                              []     3    Only limited self care, in bed greater than 50% of waking hours []     4    Completely disabled, no self care, confined to bed or chair []     5    Moribund  Past Medical History:  Diagnosis Date  . Anemia 08/21/2017  . Anxiety   . Cardiomyopathy    non ischemic NL cos on cath 08/2009, EF to 15-20%, Her follow up  2D echo  in 10/2009 showed impoved EFo 35-40%  . CHF (congestive heart failure) ([place])   . Depression   . Full dentures   . Hypercalcemia 04/20/2017  . Hypertension   . Hypothyroid   . Lung mass   . Medicare annual wellness visit, subsequent 12/14/2014   Follows with Dr Marin Olp Follows with Dr Deatra Ina, gastroenterology, last colonoscopy in 2012, repeat in 2017 Last Pap 2011, always  normal, no need for repeat Last MGM in 2011, no concerns, declines for now Follows with Dr Stanford Breed of cardiology   . Non Hodgkin's lymphoma ([place]) 11/17/1999   -Dr. Marin Olp  . Vitamin D deficiency 10/17/2015  . Wears glasses     Past Surgical History:  Procedure Laterality Date  . DILATION AND CURETTAGE OF UTERUS    . MULTIPLE TOOTH EXTRACTIONS    . NASAL POLYP EXCISION  1962  . TONSILLECTOMY  1952  . VIDEO BRONCHOSCOPY WITH ENDOBRONCHIAL NAVIGATION Right 06/29/2018   Procedure: VIDEO BRONCHOSCOPY WITH ENDOBRONCHIAL NAVIGATION;  Surgeon: Collene Gobble, MD;  Location: [facility];  Service: Thoracic;  Laterality: Right;  Marland Kitchen VIDEO BRONCHOSCOPY WITH ENDOBRONCHIAL ULTRASOUND Right 06/29/2018   Procedure: VIDEO BRONCHOSCOPY WITH ENDOBRONCHIAL ULTRASOUND;  Surgeon: Collene Gobble, MD;  Location: [facility];  Service: Thoracic;  Laterality: Right;    Family  History  Problem Relation Age of Onset  . Diabetes Mother        deceased secondary to diabetes  . Hypertension Mother   . Vision loss Mother   . Pneumonia Father        died age 56 due to complications of pneumonia  . Colon polyps Father   . Liver disease Daughter   . Sarcoidosis Daughter   . Diabetes Sister   . Colon cancer Brother 52  . Cancer Brother   . Obesity Son   . Diabetes Brother   . Kidney disease Brother   . Diabetes Sister   . Sarcoidosis Sister   . Arthritis Sister   . Arthritis Sister   . Diabetes Sister   . Arthritis Sister   . Diabetes Sister   . Hypertension Daughter   . Hypertension Daughter   . Other Other        no obvious premature cardiovascular disease or familial cardiomyopathy is noted  . Alcohol abuse Neg Hx   . Heart disease Neg Hx     Social History Social History   Tobacco Use  . Smoking status: Never Smoker  . Smokeless tobacco: Never Used  Substance Use Topics  . Alcohol use: Never    Alcohol/week: 0.0 standard drinks    Frequency: Never  . Drug use: Never    Current Outpatient Medications  Medication Sig Dispense Refill  . albuterol (PROVENTIL HFA;VENTOLIN HFA) 108 (90 Base) MCG/ACT inhaler Inhale 1-2 puffs into the lungs every 4 (four) hours as needed for wheezing or shortness of breath. 1 Inhaler 2  . ALPRAZolam (XANAX) 0.25 MG tablet Take 1 tablet (0.25 mg total) by mouth 2 (two) times daily as needed for anxiety. 30 tablet 1  . benzonatate (TESSALON) 100 MG capsule Take 1-2 capsules (100-200 mg total) by mouth 3 (three) times daily as needed for cough. 60 capsule 1  . carvedilol (COREG) 6.25 MG tablet Take 1 tablet (6.25 mg total) by mouth 2 (two) times daily. 180 tablet 3  . furosemide (LASIX) 40 MG tablet Take 1 tablet (40 mg total) by mouth daily for 30 days. 30 tablet 0  . levothyroxine (SYNTHROID, LEVOTHROID) 25 MCG tablet Take 1 tablet (25 mcg total) by mouth daily before breakfast. 90 tablet 1  . magnesium hydroxide  (MILK OF MAGNESIA) 400 MG/5ML suspension Take 15 mLs by mouth daily as needed for mild constipation.    . Multiple Vitamins-Minerals (CENTRUM PO) Take 1 tablet by mouth daily.     . Omega-3 Fatty Acids (FISH OIL) 1000 MG CAPS Take 1,000 mg by mouth daily.    Marland Kitchen  Probiotic Product (PROBIOTIC DAILY PO) Take 1 capsule by mouth daily.     Marland Kitchen Spacer/Aero-Holding Chambers (AEROCHAMBER MV) inhaler Use as instructed 1 each 0   No current facility-administered medications for this visit.     Allergies  Allergen Reactions  . Lisinopril Swelling    Facial and lip swelling  . Iohexol Itching         Review of Systems  Constitutional: Positive for activity change (very limited). Negative for chills, diaphoresis, fever and unexpected weight change.  HENT: Negative for trouble swallowing and voice change.   Eyes: Negative for visual disturbance.  Respiratory: Positive for cough and shortness of breath.   Cardiovascular: Positive for leg swelling. Negative for chest pain.       Orthopnea, PND  Gastrointestinal: Negative for abdominal distention and abdominal pain.  Genitourinary: Negative for difficulty urinating and dysuria.  Musculoskeletal: Positive for arthralgias.  Neurological: Negative for seizures and weakness.  Hematological: Negative for adenopathy. Bruises/bleeds easily.  All other systems reviewed and are negative.   BP 116/78 (BP Location: Right Arm, Patient Position: Sitting, Cuff Size: Normal)   Pulse 84   Temp (!) 97.1 F (36.2 C) (Oral)   Resp 18   Ht 5\' 3"  (1.6 m)   Wt 130 lb (59 kg)   SpO2 95% Comment: ON RA  BMI 23.03 kg/m  Physical Exam Vitals signs reviewed.  Constitutional:      Appearance: Normal appearance.  HENT:     Head: Normocephalic and atraumatic.  Eyes:     General: No scleral icterus.    Extraocular Movements: Extraocular movements intact.     Conjunctiva/sclera: Conjunctivae normal.  Neck:     Musculoskeletal: Neck supple.     Vascular: No carotid  bruit.  Cardiovascular:     Rate and Rhythm: Normal rate and regular rhythm.     Heart sounds: No murmur. Gallop (S4) present.   Pulmonary:     Effort: No respiratory distress.     Comments: Diminished breath sounds bibasilar, short of breath with talking Abdominal:     General: There is no distension.     Palpations: Abdomen is soft.     Tenderness: There is no abdominal tenderness.  Musculoskeletal:        General: Swelling (3+ both lower extremities) present.  Skin:    General: Skin is warm and dry.  Neurological:     General: No focal deficit present.     Mental Status: She is alert and oriented to person, place, and time.     Cranial Nerves: No cranial nerve deficit.     Motor: No weakness.    Diagnostic Tests: CT CHEST WITHOUT CONTRAST  TECHNIQUE: Multidetector CT imaging of the chest was performed following the standard protocol without IV contrast.  COMPARISON:  06/24/2018  FINDINGS: Cardiovascular: The heart size is mildly enlarged. Aortic atherosclerosis identified. Calcification in the left circumflex coronary artery noted.  Mediastinum/Nodes: Calcified nodule in inferior pole of left lobe of thyroid gland is again noted. The trachea appears patent. Unremarkable appearance of the esophagus. Extensive partially calcified mediastinal and hilar lymphadenopathy is again noted.  -index pre-vascular lymph node measures 1.2 cm, image 38/2. Previously 1.4 cm.  -index right paratracheal lymph node measures 1.6 cm, image 42/2. Unchanged.  -the index subcarinal lymph node measures 2 cm, image 55/2. Previously 2.1 cm.  -the index right hilar lymph node measures 2.9 cm, image 61/2. Stable.  -left hilar lymph node measures 2.7 cm, image 54/2. Previously 2.9 cm.  Lungs/Pleura: New moderate bilateral pleural effusions are identified.  There is new partial atelectasis of the right middle lobe and lingula. Also new from previous exam is diffuse  asymmetric ground-glass attenuation, thickening of the pulmonary interstitium and airspace consolidation within the right upper lobe, image 34/3.  The left upper lobe cavitary lung mass is again noted. On today's exam this measures 3.7 by 2.7 cm, image 15/2. Previously 4.2 x 3.4 cm. Right lung perifissural and subpleural nodularity is again noted. Index nodule within the superior segment of right lower lobe measures 1.5 cm, image 53/3. Previously 1.3 cm. Index nodule within the posterior right middle lobe measures 1.4 cm, image 54/3. Unchanged. Subpleural nodule within the anterolateral right lower lobe measures 4 mm, image 69/3. Previously 3 mm. The solid round peribronchovascular nodule in the right lower lobe measures 1 cm, image 73/3. Unchanged from previous exam. The subpleural nodule within the anterolateral left lower lobe measures 0.9 cm, image 54/3. Unchanged.  Upper Abdomen: No acute abnormality  Musculoskeletal: No chest wall mass or suspicious bone lesions identified.  IMPRESSION: 1. The cavitary mass within the left apex is again noted and is slightly decreased in size from previous exam. 2. Additional scattered subpleural, perifissural and peribronchovascular nodules in both lungs are again noted and are not significantly changed in the interval. 3. Interval development of moderate bilateral pleural effusions, right middle lobe and lingular partial atelectasis, and diffuse ground-glass attenuation, interstitial thickening and airspace consolidation in the right upper lobe. Imaging findings are concerning for superimposed pneumonia and possible CHF. 4. Stable appearance of enlarged and partially calcified mediastinal lymph nodes. 5. Aortic atherosclerosis and coronary artery calcification.  Aortic Atherosclerosis (ICD10-I70.0).   Electronically Signed   By: Kerby Moors M.D.   On: 01/10/2019 13:37 I personally reviewed the CT images and compared them  to films from CT chest on 05/24/2018 and 09/05/2009 as well as the PET/CT from 06/06/2018  Impression: Crystal [name] is an 85 year old woman with a past medical history significant for non-Hodgkin's lymphoma (18 years ago), nonischemic cardiomyopathy, chronic systolic congestive heart failure, hypertension, hypothyroidism, anxiety, depression, anemia, and hypothyroidism.  She had a CT of the chest about 9 months ago which showed a cavitary left upper lobe mass along with mediastinal and hilar adenopathy.  These areas were hypermetabolic on PET/CT.  Bronchoscopy and endobronchial ultrasound was nondiagnostic.  A more recent CT showed no significant change in those areas.  Interestingly looking back at her CT from 2010 those findings were present at that time, although they have progressed significantly in the decade since that study.  Differential diagnosis includes lung cancer, lymphoma, AFB and fungal infections, and sarcoidosis.  This would be a remarkably indolent course for a primary bronchogenic carcinoma, so I think that is unlikely, but cannot completely rule it out.  Currently she is short of breath with even minimal activities and has significant orthopnea and peripheral edema.  She is in decompensated heart failure.  I instructed her to contact Dr. Jacalyn Lefevre office to see if any medication adjustments could be made to help her with her symptoms.  She had fairly sizable effusions particularly on the left on her chest CT.  For completeness sake she needs fungal antibody panels and QuantiFERON gold testing.  Given the current situation with COVID, I cannot justify putting her through the risk of an invasive procedure at the current time.  She needs to be optimized medically first anyway.  I will plan to see her back in about 6 weeks.  Hopefully the COVID situation will have calmed down by then and we can discuss possible biopsy at that time.  Plan: Instructed patient to contact Dr. Jacalyn Lefevre  office regarding shortness of breath and leg swelling. Fungal antibody panel QuantiFERON gold Return in 6 weeks with repeat CT of chest with super D protocol for possible navigational bronchoscopy and endobronchial ultrasound.  Melrose Nakayama, MD [facility] [phone]

## 2019-02-22 ENCOUNTER — Other Ambulatory Visit: Payer: Self-pay | Admitting: *Deleted

## 2019-02-22 ENCOUNTER — Telehealth (INDEPENDENT_AMBULATORY_CARE_PROVIDER_SITE_OTHER): Payer: Medicare Other | Admitting: Adult Health

## 2019-02-22 ENCOUNTER — Telehealth: Payer: Self-pay | Admitting: Cardiology

## 2019-02-22 ENCOUNTER — Telehealth: Payer: Self-pay | Admitting: Emergency Medicine

## 2019-02-22 VITALS — BP 110/76 | HR 86 | Ht 63.0 in | Wt 131.2 lb

## 2019-02-22 DIAGNOSIS — I428 Other cardiomyopathies: Secondary | ICD-10-CM

## 2019-02-22 DIAGNOSIS — R609 Edema, unspecified: Secondary | ICD-10-CM

## 2019-02-22 DIAGNOSIS — R918 Other nonspecific abnormal finding of lung field: Secondary | ICD-10-CM

## 2019-02-22 DIAGNOSIS — R05 Cough: Secondary | ICD-10-CM

## 2019-02-22 DIAGNOSIS — R059 Cough, unspecified: Secondary | ICD-10-CM

## 2019-02-22 DIAGNOSIS — I10 Essential (primary) hypertension: Secondary | ICD-10-CM

## 2019-02-22 DIAGNOSIS — I5022 Chronic systolic (congestive) heart failure: Secondary | ICD-10-CM

## 2019-02-22 DIAGNOSIS — J90 Pleural effusion, not elsewhere classified: Secondary | ICD-10-CM

## 2019-02-22 MED ORDER — BENZONATATE 100 MG PO CAPS: 100.0000 mg | ORAL_CAPSULE | Freq: Three times a day (TID) | ORAL | 1 refills | Status: DC | PRN

## 2019-02-22 MED ORDER — FUROSEMIDE 40 MG PO TABS: 40.0000 mg | ORAL_TABLET | Freq: Every day | ORAL | 1 refills | Status: DC

## 2019-02-22 MED ORDER — CARVEDILOL 6.25 MG PO TABS: 6.2500 mg | ORAL_TABLET | Freq: Two times a day (BID) | ORAL | 1 refills | Status: DC

## 2019-02-22 NOTE — Telephone Encounter (Signed)
Spoke with daughter scheduled appt for today a 330pm

## 2019-02-22 NOTE — Telephone Encounter (Signed)
Can Curt Bears do a telephone visit with this patient by chance later this afternoon?  Per Angie, if not she can try to call them at 1:30

## 2019-02-22 NOTE — Telephone Encounter (Signed)
Called patient, spoke with daughter- she states they seen cardiothoracic surgeon yesterday and they had some complaints of leg swelling and SOB, they advised her to contact Dr.Crenshaw office, I advised that he was out of office this week - but I could try to assist them. Patient has swelling location on just ankles and legs bilateral. She has SOB when laying down so she mostly sits up, patient has no complaints of chest pain, no changes in her diet, no weight gain, weight yesterday was 130lbs, she does mention pain in her neck that subsides when she holds her head down. Please review note from visit yesterday. They suggest increasing medications, patient does take lasix 40 mg but does not seem to help with swelling. Patient also advised to wear compression stockings, daughter states she has some but does not wear them. I advised to wear them to see if it helps with the swelling, advised I would route message to preop PA and PharmD for recommendations.

## 2019-02-22 NOTE — Telephone Encounter (Signed)
Attempted to contact patient She has Albuterol rescue inhaler per chart. On 01/30/19 a rx for a spacer was sent to [facility]. Unsure of what's needed.

## 2019-02-22 NOTE — Patient Instructions (Addendum)
Medication Instructions:  MAY TAKE FUROSEMIDE 40MG  DAILY- MAY TAKE ADDITIONAL 1/2 TAB(20MG ) TWICE A WEEK AS NEEDED FOR ADDITIONAL SWELLING, PUFFINESS IN THE FEET AND ANKLES.  MAKE SURE TO TAKE THE FUROSEMIDE WITH YOUR THYROID MEDICATION ON AN EMPTY STOMACH 30 MINUTES BEFORE EATING.  CONTINUE TO TAKE AND LOG BP DAILY.  If you need a refill on your cardiac medications before your next appointment, please call your pharmacy.  Labwork: BMET-NOT FASTING IN OUR OFFICE 03-27-2019 HERE IN OUR OFFICE AT [facility]   You will NOT need to fast   Take the provided lab slips with you to the lab for your blood draw.   When you have your labs (blood work) drawn today and your tests are completely normal, you will receive your results only by MyChart Message (if you have MyChart) -OR-  A paper copy in the mail.  If you have any lab test that is abnormal or we need to change your treatment, we will call you to review these results.  Follow-Up: .    KEEP APPOINTMENT WITH DR Stanford Breed 03-30-2019  At [facility], you and your health needs are our priority.  As part of our continuing mission to provide you with exceptional heart care, we have created designated Provider Care Teams.  These Care Teams include your primary Cardiologist (physician) and Advanced Practice Providers (APPs -  Physician Assistants and Nurse Practitioners) who all work together to provide you with the care you need, when you need it.  Thank you for choosing [facility] at [facility]!!

## 2019-02-22 NOTE — Telephone Encounter (Signed)
Message received from Triage:  I called and spoke with the husband who did not want to talk about his wife's symptoms. He only wanted to drive to the office and pick up a letter with instructions. It was difficult for me to explain to him that I don't yet have instructions until I speak with his wife about her symptoms. I was finally able to speak with his wife, but gathering history was nearly impossible.   With their permission, I called the daughter - Elzie Rings at [phone]. I asked if she could go to their house and facilitate an evisit. There is no internet at their house and she is unable to download an app. She is amenable to a telephone call.  I will have a PHONE visit set up this afternoon.

## 2019-02-22 NOTE — Telephone Encounter (Signed)
Late entry....   In the setting of the current Covid19 crisis, you are scheduled for a 4-8 visit with your provider on 02-22-2019 at 330pm.  Just as we do with many in-office visits, in order for you to participate in this visit, we must obtain consent.  If you'd like, I can send this to your mychart (if signed up) or email for you to review.  Otherwise, I can obtain your verbal consent now.  All virtual visits are billed to your insurance company just like a normal visit would be.  By agreeing to a virtual visit, we'd like you to understand that the technology does not allow for your provider to perform an examination, and thus may limit your provider's ability to fully assess your condition.  Finally, though the technology is pretty good, we cannot assure that it will always work on either your or our end, and in the setting of a video visit, we may have to convert it to a phone-only visit.  In either situation, we cannot ensure that we have a secure connection.  Are you willing to proceed?  Verbalized consent.

## 2019-02-22 NOTE — Telephone Encounter (Signed)
New Message          Patient's daughter Olin Hauser is calling to see if Dr. Stanford Breed had a chance to correspond Dr. Modesto Charon, and if so what medication if any has being increased? Pls call and advise

## 2019-02-22 NOTE — Progress Notes (Signed)
Virtual Visit via Telephone Note   This visit type was conducted due to national recommendations for restrictions regarding the COVID-19 Pandemic (e.g. social distancing) in an effort to limit this patient's exposure and mitigate transmission in our community.  Due to her co-morbid illnesses, this patient is at least at moderate risk for complications without adequate follow up.  This format is felt to be most appropriate for this patient at this time.  The patient did not have access to video technology/had technical difficulties with video requiring transitioning to audio format only (telephone).  All issues noted in this document were discussed and addressed.  No physical exam could be performed with this format.  Please refer to the patient's chart for her  consent to telehealth for [facility].   Evaluation Performed:  Follow-up visit  This visit type was conducted due to national recommendations for restrictions regarding the COVID-19 Pandemic (e.g. social distancing).  This format is felt to be most appropriate for this patient at this time.  All issues noted in this document were discussed and addressed.  No physical exam was performed (except for noted visual exam findings with Telehealth visits).  See MyChart message from today for the patient's consent to telehealth for [facility].  Date:  02/22/2019   ID:  Crystal [name], DOB 04/17/1934, MRN [id number]  Patient Location:  [place] [address]   Provider location:   [place], [place] office   PCP:  Mosie Lukes, MD  Cardiologist:  [name], MD  Electrophysiologist:  None   Chief Complaint:  LEE Edema   History of Present Illness:    Crystal [name] is a 85 y.o. female who presents via audio/video conferencing for a telehealth visit today for ongoing assessment and management of chronic systolic CHF, EF of 69%-48%,NI of CABG. Other history includes CKD Stage III, cavitary lung mass (benign),  non-Hodgkin's lymphoma in remission, hypothyroidism, and chronic anxiety state.   She was recently hospitalized on  01/12/2019 for respiratory failure due to acute on chronic systolic CHF and pneumonia. She was given aggressive diureses. She was transitioned to po furosemide 40 mg daily. She was not placed on potassium supplements due to decreased GFR. She was to follow up with pulmonary as OP.   Was seen by Eric Form, NP with pulmonary on 01/30/2019.At that time she was doing well, had decreased LEE. She was to have repeat CXR in one month due to pulmonary effusion. She was to contact cardiology for any increase in LEE or weight gain.   She saw Dr. Roxan Hockey for lung mass who documented that previous biopsies were negative. Due to COVID pandemic, he felt she should receive optimization of her medical therapy, and consider repeat biopsy once pandemic has passed, and repeat CT of the lungs. He ordered a fungal antibody panel and QuantiFERON  gold test. Weight 130 lbs on office visit.   She called our office today due to increased LEE. No weight gain but symptoms of PND. She was advised by phone to wear compression hose but does not like wearing them. She remained on lasix 40 mg daily. Patient's daughter is with her to help her with this visit, as it is difficult for the patient to provide history.  After careful pointed questioning, the patient is having more issues with neck pain which causes her to have to get out of bed at night, not dyspnea. The patient becomes anxious and starts breathing hard, and therefore thinks its related  to lung issues. She uses her inhalers and takes a Xanax which helps her feel better, and then falls asleep in a chair.   The edema is described as in her feet and ankles which is better when she has her legs elevated and worse when she is keeping her legs in the dependent position. She is not completely eliminating salt in her diet, eating a lot of prepared foods, because it  is easier for her husband to make this for them.   She has a lot of coughing but is not taking [employer] as directed.    The patient symptoms concerning for COVID-19 infection (fever, chills, cough, or new SHORTNESS OF BREATH).    Prior CV studies:   The following studies were reviewed today:  Echocardiogram 11/30/2018 Left ventricle: The cavity size was normal. Wall thickness was   normal. Systolic function was moderately reduced. The estimated   ejection fraction was in the range of 35% to 40%. Features are   consistent with a pseudonormal left ventricular filling pattern,   with concomitant abnormal relaxation and increased filling   pressure (grade 2 diastolic dysfunction). - Aortic valve: There was trivial regurgitation. - Left atrium: The atrium was severely dilated. - Right ventricle: The cavity size was moderately decreased.  Past Medical History:  Diagnosis Date   Anemia 08/21/2017   Anxiety    Cardiomyopathy    non ischemic NL cos on cath 08/2009, EF to 15-20%, Her follow up  2D echo  in 10/2009 showed impoved EFo 35-40%   CHF (congestive heart failure) ([place])    Depression    Full dentures    Hypercalcemia 04/20/2017   Hypertension    Hypothyroid    Lung mass    Medicare annual wellness visit, subsequent 12/14/2014   Follows with Dr Marin Olp Follows with Dr Deatra Ina, gastroenterology, last colonoscopy in 2012, repeat in 2017 Last Pap 2011, always normal, no need for repeat Last MGM in 2011, no concerns, declines for now Follows with Dr Stanford Breed of cardiology    Non Hodgkin's lymphoma ([place]) 11/17/1999   -Dr. Marin Olp   Vitamin D deficiency 10/17/2015   Wears glasses    Past Surgical History:  Procedure Laterality Date   DILATION AND CURETTAGE OF UTERUS     MULTIPLE TOOTH EXTRACTIONS     NASAL [place] WITH ENDOBRONCHIAL NAVIGATION Right 06/29/2018   Procedure: VIDEO BRONCHOSCOPY WITH  ENDOBRONCHIAL NAVIGATION;  Surgeon: Collene Gobble, MD;  Location: [facility];  Service: Thoracic;  Laterality: Right;   VIDEO BRONCHOSCOPY WITH ENDOBRONCHIAL ULTRASOUND Right 06/29/2018   Procedure: VIDEO BRONCHOSCOPY WITH ENDOBRONCHIAL ULTRASOUND;  Surgeon: Collene Gobble, MD;  [facility];  Service: Thoracic;  Laterality: Right;     Current Meds  Medication Sig   albuterol (PROVENTIL HFA;VENTOLIN HFA) 108 (90 Base) MCG/ACT inhaler Inhale 1-2 puffs into the lungs every 4 (four) hours as needed for wheezing or shortness of breath.   ALPRAZolam (XANAX) 0.25 MG tablet Take 1 tablet (0.25 mg total) by mouth 2 (two) times daily as needed for anxiety.   benzonatate (TESSALON) 100 MG capsule Take 1-2 capsules (100-200 mg total) by mouth 3 (three) times daily as needed for cough.   carvedilol (COREG) 6.25 MG tablet Take 1 tablet (6.25 mg total) by mouth 2 (two) times daily.   levothyroxine (SYNTHROID, LEVOTHROID) 25 MCG tablet Take 1 tablet (25 mcg total) by mouth daily before breakfast.   magnesium hydroxide (  MILK OF MAGNESIA) 400 MG/5ML suspension Take 15 mLs by mouth daily as needed for mild constipation.   Multiple Vitamins-Minerals (CENTRUM PO) Take 1 tablet by mouth daily.    Omega-3 Fatty Acids (FISH OIL) 1000 MG CAPS Take 1,000 mg by mouth daily.   Probiotic Product (PROBIOTIC DAILY PO) Take 1 capsule by mouth daily.    Spacer/Aero-Holding Chambers (AEROCHAMBER MV) inhaler Use as instructed     Allergies:   Lisinopril and Iohexol   Social History   Tobacco Use   Smoking status: Never Smoker   Smokeless tobacco: Never Used  Substance Use Topics   Alcohol use: Never    Alcohol/week: 0.0 standard drinks    Frequency: Never   Drug use: Never     Family Hx: The patient's family history includes Arthritis in her sister, sister, and sister; Cancer in her brother; Colon cancer (age of onset: 60) in her brother; Colon polyps in her father; Diabetes in her brother, mother,  sister, sister, sister, and sister; Hypertension in her daughter, daughter, and mother; Kidney disease in her brother; Liver disease in her daughter; Obesity in her son; Other in an other family member; Pneumonia in her father; Sarcoidosis in her daughter and sister; Vision loss in her mother. There is no history of Alcohol abuse or Heart disease.  ROS:   Please see the history of present illness.    All other systems reviewed and are negative.   Labs/Other Tests and Data Reviewed:    Recent Labs: 11/29/2018: TSH 4.755 01/12/2019: B Natriuretic Peptide 3,955.3 01/15/2019: Magnesium 1.9 02/08/2019: ALT 26; BUN 20; Creatinine 1.33; Hemoglobin 12.4; Platelet Count 144; Potassium 4.3; Sodium 140   Recent Lipid Panel Lab Results  Component Value Date/Time   CHOL 172 08/29/2018 11:10 AM   TRIG 98.0 08/29/2018 11:10 AM   HDL 55.90 08/29/2018 11:10 AM   CHOLHDL 3 08/29/2018 11:10 AM   LDLCALC 97 08/29/2018 11:10 AM    Wt Readings from Last 3 Encounters:  02/22/19 131 lb 3.2 oz (59.5 kg)  02/21/19 130 lb (59 kg)  02/08/19 130 lb 1.9 oz (59 kg)     Exam:    Vital Signs:  BP 110/76 (BP Location: Left Arm)    Pulse 86    Ht 5\' 3"  (1.6 m)    Wt 131 lb 3.2 oz (59.5 kg)    BMI 23.24 kg/m    Difficult to assess via phone virtual visit. I do hear a lot of coughing but she has had this since discharge from the hospital. She is not taking cough suppressants as directed.   ASSESSMENT & PLAN:    1. Chronic Systolic CHF : Most recent EF was 35%-40%. She is not restricting her salt intake. Her weight is stable however. I have recommended that she reduce salt as much as possible. She can take an extra 20 mg tablet twice a week prn worsening edema.  She is given refills on the furosemide, with 90 day supply. She is to take it on an empty stomach for better bioavailability.  Continue daily weights.   2. Hypertension: BP is stable. She is only taking carvedilol 6.25 once a day. She is instructed to take it  BID as directed. However, if BP is low (<115/60), she is to report this. We may drop back to 3.125 mg BID. She is to keep track of her BP and let us know.   3. Recent hx of pneumonia: Continues to have some dry coughing. She is not taking  Tessalon Perles as directed, only one a day in the am. I have explained to her to take 1-2 tablets TID for the coughing. Her daughter understands the instructions. She will fill her pill dispenser for her.   4. Lung mass: She is still to follow pulmonary for this and CVTS. She has inhalers to use if necessary.   5.Chronic Neck pain: She is advised to take Tylenol 500 mg tablet Q 8 hours prn, and to take one at night before going to bed. See PCP for more recommendations on pain control    COVID-19 Education: The signs and symptoms of COVID-19 were discussed with the patient and how to seek care for testing (follow up with PCP or arrange E-visit).  The importance of social distancing was discussed today.  Patient Risk:   After full review of this patients clinical status, I feel that they are at least moderate risk at this time.  Time:   Today, I have spent 25 minutes with the patient with telehealth technology discussing multiple issues with her medications, symptoms and answering multiple questions from her daughter. We will email the AVS to her daughter so that she can assist the patient with her medications.    Medication Adjustments/Labs and Tests Ordered: Current medicines are reviewed at length with the patient today.  Concerns regarding medicines are outlined above.   Tests Ordered: No orders of the defined types were placed in this encounter.  Medication Changes: No orders of the defined types were placed in this encounter.   Disposition:  1 month  Signed, Phill Myron. West Pugh, ANP, AACC  02/22/2019 3:32 PM    [facility] [address], [place], [place]  [address] Phone: [phone]; Fax: [phone]

## 2019-02-22 NOTE — Telephone Encounter (Signed)
Happy to see this patient. Please add them on and let Sharyn Lull know so she can call them

## 2019-02-23 NOTE — Telephone Encounter (Signed)
LVM for pt to return call regarding below concerns.X2

## 2019-02-23 NOTE — Telephone Encounter (Signed)
Patient daughter Olin Hauser is returning phone call.  Daughter phone number is [phone] h or [phone] c.

## 2019-02-23 NOTE — Telephone Encounter (Signed)
Called and spoke with pt's daughter Olin Hauser to clarify what it was that pt was needing. While speaking with Olin Hauser, she stated she had originally called in regards to a mask for pt due to her struggling with her inhaler but then she stated after seeing how pt was not properly using the inhaler and had not been using the spacer at all times like she was addressed to so she could be able to get all the med like she should, Olin Hauser stated that since pt has the spacer, nothing further was needed. Stated to Olin Hauser to call us if they needed anything. Nothing further needed.

## 2019-02-24 ENCOUNTER — Other Ambulatory Visit: Payer: Medicare Other

## 2019-02-27 ENCOUNTER — Telehealth: Payer: Self-pay | Admitting: Cardiology

## 2019-02-27 ENCOUNTER — Encounter: Payer: Self-pay | Admitting: *Deleted

## 2019-02-27 NOTE — Telephone Encounter (Signed)
New Message   Pts daugther is returning a call    Please call back

## 2019-02-27 NOTE — Telephone Encounter (Signed)
Spoke with daughter and she was no sure who called her mother. She did have a question about her mothers upcoming [place] ordered. Daughter did call her mother while we were talking to discuss her lower extremity swelling. This has improved without taking an extra Lasix. She is taking Lasix 40 mg daily as increased after hospitalization in February. Labs done 3/25. Since no changes in medications does she need labs prior to visit with Dr Marin Olp 03/20/19, CMP ordered?   Will forward to Dr Stanford Breed and Hilda Blades RN for review

## 2019-02-28 NOTE — Telephone Encounter (Signed)
Spoke with pt dtr, Aware of dr Jacalyn Lefevre recommendations. She will do labs with dr Marin Olp. Patient has some questions and anxiety and would like a video visit with dr Stanford Breed. Follow up scheduled

## 2019-02-28 NOTE — Telephone Encounter (Signed)
Ok to hold on labs until [employer]

## 2019-03-01 ENCOUNTER — Telehealth: Payer: Self-pay | Admitting: Cardiology

## 2019-03-01 NOTE — Progress Notes (Signed)
Virtual Visit via Video Note changed to telephone visit due to technical difficulties with patient's phone.   This visit type was conducted due to national recommendations for restrictions regarding the COVID-19 Pandemic (e.g. social distancing) in an effort to limit this patient's exposure and mitigate transmission in our community.  Due to her co-morbid illnesses, this patient is at least at moderate risk for complications without adequate follow up.  This format is felt to be most appropriate for this patient at this time.  All issues noted in this document were discussed and addressed.  Please refer to the patient's chart for her consent to telehealth for [facility].   Evaluation Performed:  Follow-up visit  Date:  03/02/2019   ID:  Crystal [name], DOB 1934-03-08, MRN [id number]  Pt location-home Provider location-[place]  PCP:  Mosie Lukes, MD  Cardiologist:  [name], MD   Chief Complaint:  FU CHF  History of Present Illness:    FU nonischemic cardiomyopathy. Cardiac catheterization in 2010 showed an ejection fraction of 15-20% and normal coronary arteries. Patient admitted with syncope previouslyfelt to potentially be vagally mediated. However given reduced LV function I discussed the possibility of of life vest or ICD. However she declined and preferred only medical therapy. The risk of sudden death was explained.Chest CT July 07, 2023 showed left apical mass with pleural mets and pulmonary mets RLL. Followed by Dr Marin Olp and Dr Roxan Hockey. Last echo 1/20 showed EF 35-40, grade 2 DD, severe LAE and moderate RV dysfunction. Admitted 2/20 with CHF and diuresed. Chest CT 2/20 showed cavitary mass left apex, moderate bilateral pleural effusions, RUL consolidation. FU chest xray 01/24/19 showed small left effusion and less edema. Seen by Dr Roxan Hockey 02/21/19 and felt to be in CHF. Since last seen,pt complains of dyspnea, orthopnea and mild pedal edema; no CP or syncope.  The  patient does not have symptoms concerning for COVID-19 infection (fever, chills, cough, or new shortness of breath).    Past Medical History:  Diagnosis Date  . Anemia 08/21/2017  . Anxiety   . Cardiomyopathy    non ischemic NL cos on cath 08/2009, EF to 15-20%, Her follow up  2D echo  in 10/2009 showed impoved EFo 35-40%  . CHF (congestive heart failure) ([place])   . Depression   . Full dentures   . Hypercalcemia 04/20/2017  . Hypertension   . Hypothyroid   . Lung mass   . Medicare annual wellness visit, subsequent 12/14/2014   Follows with Dr Marin Olp Follows with Dr Deatra Ina, gastroenterology, last colonoscopy in 2012, repeat in 2017 Last Pap 2011, always normal, no need for repeat Last MGM in 2011, no concerns, declines for now Follows with Dr Stanford Breed of cardiology   . Non Hodgkin's lymphoma ([place]) 11/17/1999   -Dr. Marin Olp  . Vitamin D deficiency 10/17/2015  . Wears glasses    Past Surgical History:  Procedure Laterality Date  . DILATION AND CURETTAGE OF UTERUS    . MULTIPLE TOOTH EXTRACTIONS    . NASAL POLYP EXCISION  1962  . TONSILLECTOMY  1952  . VIDEO BRONCHOSCOPY WITH ENDOBRONCHIAL NAVIGATION Right 06/29/2018   Procedure: VIDEO BRONCHOSCOPY WITH ENDOBRONCHIAL NAVIGATION;  Surgeon: Collene Gobble, MD;  Location: [facility];  Service: Thoracic;  Laterality: Right;  Marland Kitchen VIDEO BRONCHOSCOPY WITH ENDOBRONCHIAL ULTRASOUND Right 06/29/2018   Procedure: VIDEO BRONCHOSCOPY WITH ENDOBRONCHIAL ULTRASOUND;  Surgeon: Collene Gobble, MD;  Location: [facility];  Service: Thoracic;  Laterality: Right;     Current Meds  Medication Sig  .  albuterol (PROVENTIL HFA;VENTOLIN HFA) 108 (90 Base) MCG/ACT inhaler Inhale 1-2 puffs into the lungs every 4 (four) hours as needed for wheezing or shortness of breath.  . ALPRAZolam (XANAX) 0.25 MG tablet Take 1 tablet (0.25 mg total) by mouth 2 (two) times daily as needed for anxiety.  . benzonatate (TESSALON) 100 MG capsule Take 1-2 capsules (100-200 mg total) by mouth 3  (three) times daily as needed for cough.  . carvedilol (COREG) 6.25 MG tablet Take 1 tablet (6.25 mg total) by mouth 2 (two) times daily.  . furosemide (LASIX) 40 MG tablet Take 1 tablet (40 mg total) by mouth daily. May addt'l 20mg  (1/2 tab) as needed for addt'l swelling in LE  . levothyroxine (SYNTHROID, LEVOTHROID) 25 MCG tablet Take 1 tablet (25 mcg total) by mouth daily before breakfast.  . magnesium hydroxide (MILK OF MAGNESIA) 400 MG/5ML suspension Take 15 mLs by mouth daily as needed for mild constipation.  . Multiple Vitamins-Minerals (CENTRUM PO) Take 1 tablet by mouth daily.   . Omega-3 Fatty Acids (FISH OIL) 1000 MG CAPS Take 1,000 mg by mouth daily.  . Probiotic Product (PROBIOTIC DAILY PO) Take 1 capsule by mouth daily.   Marland [employer] Spacer/Aero-Holding Chambers (AEROCHAMBER MV) inhaler Use as instructed     Allergies:   Lisinopril and Iohexol   Social History   Tobacco Use  . Smoking status: Never Smoker  . Smokeless tobacco: Never Used  Substance Use Topics  . Alcohol use: Never    Alcohol/week: 0.0 standard drinks    Frequency: Never  . Drug use: Never     Family Hx: The patient's family history includes Arthritis in her sister, sister, and sister; Cancer in her brother; Colon cancer (age of onset: 70) in her brother; Colon polyps in her father; Diabetes in her brother, mother, sister, sister, sister, and sister; Hypertension in her daughter, daughter, and mother; Kidney disease in her brother; Liver disease in her daughter; Obesity in her son; Other in an other family member; Pneumonia in her father; Sarcoidosis in her daughter and sister; Vision loss in her mother. There is no history of Alcohol abuse or Heart disease.  ROS:   Please see the history of present illness.    No F/C, productive cough All other systems reviewed and are negative.  Recent Labs: 11/29/2018: TSH 4.755 01/12/2019: B Natriuretic Peptide 3,955.3 01/15/2019: Magnesium 1.9 02/08/2019: ALT 26; BUN 20;  Creatinine 1.33; Hemoglobin 12.4; Platelet Count 144; Potassium 4.3; Sodium 140   Recent Lipid Panel Lab Results  Component Value Date/Time   CHOL 172 08/29/2018 11:10 AM   TRIG 98.0 08/29/2018 11:10 AM   HDL 55.90 08/29/2018 11:10 AM   CHOLHDL 3 08/29/2018 11:10 AM   LDLCALC 97 08/29/2018 11:10 AM    Wt Readings from Last 3 Encounters:  03/02/19 127 lb (57.6 kg)  02/22/19 131 lb 3.2 oz (59.5 kg)  02/21/19 130 lb (59 kg)     Objective:    Vital Signs:  BP 123/86   Pulse 75   Ht 5\' 3"  (1.6 m)   Wt 127 lb (57.6 kg)   BMI 22.50 kg/m   PE not performed (telehealth visit; corona virus pandemic)  ASSESSMENT & PLAN:    1. Acute on Chronic Systolic CHF-patient is volume overloaded with complaints of dyspnea, orthopnea and mild lower extremity edema.  She did state that her symptoms have improved since she was hospitalized.  I will increase Lasix to 60 mg daily.  Check potassium and renal function in  1 week.  We discussed the importance of fluid restriction and low-sodium diet. 2. NICM-continue coreg; angioedema with ACE-I in past.  Systolic blood pressure averages 100 to 105 at home.  I will try hydralazine 10 mg p.o. 3 times daily to see if she tolerates.  If so we can consider addition of isosorbide 30 mg daily in the future.  She declined ICD previously. 3. Hypertension-BP controlled 4. Lung mass-fu Dr Roxan Hockey.  COVID-19 Education: The importance of social distancing was discussed today.  Time:   Today, I have spent 20 minutes with the patient with telehealth technology discussing the above problems.     Medication Adjustments/Labs and Tests Ordered: Current medicines are reviewed at length with the patient today.  Concerns regarding medicines are outlined above.   Tests Ordered: No orders of the defined types were placed in this encounter.   Medication Changes: No orders of the defined types were placed in this encounter.   Disposition:  Follow up 6-8  weeks  Signed, [name], MD  03/02/2019 10:32 AM    [place]

## 2019-03-01 NOTE — Telephone Encounter (Signed)
Declined Mychart, no smartphone, pre reg complete 03/01/19 AF

## 2019-03-02 ENCOUNTER — Telehealth: Payer: Medicare Other | Admitting: Cardiology

## 2019-03-02 ENCOUNTER — Encounter: Payer: Self-pay | Admitting: Cardiology

## 2019-03-02 ENCOUNTER — Telehealth (INDEPENDENT_AMBULATORY_CARE_PROVIDER_SITE_OTHER): Payer: Medicare Other | Admitting: Cardiology

## 2019-03-02 VITALS — BP 123/86 | HR 75 | Ht 63.0 in | Wt 127.0 lb

## 2019-03-02 DIAGNOSIS — I5023 Acute on chronic systolic (congestive) heart failure: Secondary | ICD-10-CM | POA: Diagnosis not present

## 2019-03-02 DIAGNOSIS — I10 Essential (primary) hypertension: Secondary | ICD-10-CM

## 2019-03-02 DIAGNOSIS — I428 Other cardiomyopathies: Secondary | ICD-10-CM

## 2019-03-02 MED ORDER — FUROSEMIDE 40 MG PO TABS: 60.0000 mg | ORAL_TABLET | Freq: Every day | ORAL | 3 refills | Status: DC

## 2019-03-02 MED ORDER — HYDRALAZINE HCL 10 MG PO TABS: 10.0000 mg | ORAL_TABLET | Freq: Three times a day (TID) | ORAL | 3 refills | Status: DC

## 2019-03-02 NOTE — Telephone Encounter (Signed)
New Message    Pts daughter is calling and the link wont let her access it for the Video appt   Please call back

## 2019-03-02 NOTE — Telephone Encounter (Signed)
This encounter was created in error - please disregard.

## 2019-03-02 NOTE — Patient Instructions (Signed)
Medication Instructions:  INCREASE FUROSEMIDE TO 60 MG ONCE DAILY= 1 AND 1/2 OF THE 40 MG TABLET ONCE DAILY  START HYDRALAZINE 10 MG ONE TABLET THREE TIMES DAILY If you need a refill on your cardiac medications before your next appointment, please call your pharmacy.   Lab work: Your physician recommends that you return for lab work in: [place] If you have labs (blood work) drawn today and your tests are completely normal, you will receive your results only by: Marland Kitchen MyChart Message (if you have MyChart) OR . A paper copy in the mail If you have any lab test that is abnormal or we need to change your treatment, we will call you to review the results.  Follow-Up: At [facility], you and your health needs are our priority.  As part of our continuing mission to provide you with exceptional heart care, we have created designated Provider Care Teams.  These Care Teams include your primary Cardiologist (physician) and Advanced Practice Providers (APPs -  Physician Assistants and Nurse Practitioners) who all work together to provide you with the care you need, when you need it. Your physician recommends that you schedule a follow-up appointment in:  [place]

## 2019-03-02 NOTE — Telephone Encounter (Signed)
See below, patient as appointment today.  Thanks!

## 2019-03-03 ENCOUNTER — Ambulatory Visit (HOSPITAL_COMMUNITY)
Admission: RE | Admit: 2019-03-03 | Discharge: 2019-03-03 | Disposition: A | Discharge status: Home / Self Care | Payer: Medicare Other | Source: Ambulatory Visit | Attending: Hematology & Oncology | Admitting: Hematology & Oncology

## 2019-03-03 ENCOUNTER — Other Ambulatory Visit: Payer: Self-pay

## 2019-03-03 DIAGNOSIS — J90 Pleural effusion, not elsewhere classified: Secondary | ICD-10-CM | POA: Diagnosis not present

## 2019-03-03 DIAGNOSIS — R918 Other nonspecific abnormal finding of lung field: Secondary | ICD-10-CM | POA: Diagnosis not present

## 2019-03-03 DIAGNOSIS — R590 Localized enlarged lymph nodes: Secondary | ICD-10-CM | POA: Insufficient documentation

## 2019-03-03 DIAGNOSIS — R911 Solitary pulmonary nodule: Secondary | ICD-10-CM

## 2019-03-03 DIAGNOSIS — J984 Other disorders of lung: Secondary | ICD-10-CM | POA: Diagnosis not present

## 2019-03-03 LAB — GLUCOSE, CAPILLARY: Glucose-Capillary: 99 mg/dL (ref 70–99)

## 2019-03-03 MED ORDER — FLUDEOXYGLUCOSE F - 18 (FDG) INJECTION
6.3600 | Freq: Once | INTRAVENOUS | Status: AC | PRN
  Administered 2019-03-03: 6.36 via INTRAVENOUS

## 2019-03-09 ENCOUNTER — Ambulatory Visit (HOSPITAL_COMMUNITY): Payer: Medicare Other

## 2019-03-10 DIAGNOSIS — I428 Other cardiomyopathies: Secondary | ICD-10-CM | POA: Diagnosis not present

## 2019-03-10 LAB — BASIC METABOLIC PANEL
BUN/Creatinine Ratio: 14 (ref 12–28)
BUN: 24 mg/dL (ref 8–27)
CO2: 27 mmol/L (ref 20–29)
Calcium: 10.5 mg/dL — ABNORMAL HIGH (ref 8.7–10.3)
Chloride: 97 mmol/L (ref 96–106)
Creatinine, Ser: 1.74 mg/dL — ABNORMAL HIGH (ref 0.57–1.00)
GFR calc Af Amer: 31 mL/min/{1.73_m2} — ABNORMAL LOW (ref >59)
GFR calc non Af Amer: 27 mL/min/{1.73_m2} — ABNORMAL LOW (ref >59)
Glucose: 152 mg/dL — ABNORMAL HIGH (ref 65–99)
Potassium: 4.6 mmol/L (ref 3.5–5.2)
Sodium: 141 mmol/L (ref 134–144)

## 2019-03-16 ENCOUNTER — Telehealth: Payer: Self-pay | Admitting: Cardiology

## 2019-03-16 ENCOUNTER — Other Ambulatory Visit: Payer: Self-pay | Admitting: Family Medicine

## 2019-03-16 ENCOUNTER — Telehealth: Payer: Self-pay | Admitting: Family Medicine

## 2019-03-16 DIAGNOSIS — I5022 Chronic systolic (congestive) heart failure: Secondary | ICD-10-CM

## 2019-03-16 MED ORDER — LEVOTHYROXINE SODIUM 25 MCG PO TABS: 25.0000 ug | ORAL_TABLET | Freq: Every day | ORAL | 0 refills | Status: DC

## 2019-03-16 NOTE — Telephone Encounter (Signed)
Spoke with pt's daughter and B/P are low see readings below and also since increase in Furosemide weight is down to 120 from 126 Pt also complaining with leg cramps.Potassium from 4/24 was 4.6 Will forward to Dr Stanford Breed for review .Adonis Housekeeper

## 2019-03-16 NOTE — Telephone Encounter (Signed)
°  Pt c/o BP issue: STAT if pt c/o blurred vision, one-sided weakness or slurred speech  1. What are your last 5 BP readings? 87/63 HR 78 today, 90/67, 90/63, 86/61  2. Are you having any other symptoms (ex. Dizziness, headache, blurred vision, passed out)? Dizziness at times  3. What is your BP issue? States BP too low

## 2019-03-16 NOTE — Telephone Encounter (Signed)
I have sent in a new prescription for Levothyroxine for her and would like to wait until June to bring her in for labs after our peak has passed so keep taking the 25 mcg dose daily for now. She should probably have a visit for her neck so I can get more details and symptoms to help sort out the cause of her pain. For now have her take Tylenol/Acetaminophen ES 500 mg tabs, 1 tab po bid for the next week and apply moist heat to the neck tid to see if that helps. Of course seek help if pain becomes intolerable.

## 2019-03-16 NOTE — Telephone Encounter (Signed)
DC hydralazine; bmet one week [name]

## 2019-03-16 NOTE — Telephone Encounter (Signed)
Spoke with pt dtr, Aware of dr Jacalyn Lefevre recommendations. Lab orders mailed to the pt

## 2019-03-16 NOTE — Telephone Encounter (Signed)
Is there another medication that you would like for patient to take. Would you like appointment with patient for neck pain?

## 2019-03-16 NOTE — Telephone Encounter (Addendum)
Copied from [place] [id number]. Topic: Quick Communication - See Telephone Encounter >> Mar 16, 2019 11:52 AM Ivar Drape wrote: CRM for notification. See Telephone encounter for: 03/16/19. Patient's daughter, Lakindra Wible - [phone] said the patient's pharmacy ([facility] on [address] [phone]) said the patient's levothyroxine (SYNTHROID, LEVOTHROID) 25 MCG tablet medication is not available.  She would like to know what she is suppose to take now.  Patient will be out of the medication the first of next week.  Please advise.    Also the patient has been complaining of neck pain.  The daughter would like to know if something could be causing that pain, and what should they do about it.

## 2019-03-17 ENCOUNTER — Telehealth: Payer: Self-pay | Admitting: Family Medicine

## 2019-03-17 NOTE — Telephone Encounter (Signed)
Called patient regarding medication that has been sent in, left message for return call. Mailed daughter copy of directions per her request.

## 2019-03-17 NOTE — Telephone Encounter (Signed)
Copy of Snapshot and Medication information mailed.

## 2019-03-17 NOTE — Telephone Encounter (Signed)
I have sent in a new prescription for Levothyroxine for her and would like to wait until June to bring her in for labs after our peak has passed so keep taking the 25 mcg dose daily for now. She should probably have a visit for her neck so I can get more details and symptoms to help sort out the cause of her pain. For now have her take Tylenol/Acetaminophen ES 500 mg tabs, 1 tab po bid for the next week and apply moist heat to the neck tid to see if that helps. Of course seek help if pain becomes intolerable.

## 2019-03-17 NOTE — Telephone Encounter (Signed)
Copied from [place] [phone]. Topic: Quick Communication - See Telephone Encounter >> Mar 17, 2019 12:58 PM Blase Mess A wrote: CRM for notification. See Telephone encounter for: 03/17/19.  Patient's daughter is requesting a letter with Dr. Rhae Lerner instructions for her Levothyroxine  The Patient does not have medication. Thank you

## 2019-03-17 NOTE — Telephone Encounter (Signed)
Copied from [place] [phone]. Topic: Quick Communication - See Telephone Encounter >> Mar 16, 2019 11:52 AM Ivar Drape wrote: CRM for notification. See Telephone encounter for: 03/16/19. Patient's daughter, Katisha Shimizu - [phone] said the patient's pharmacy ([facility] on [address] and [address] [phone]) said the patient's levothyroxine (SYNTHROID, LEVOTHROID) 25 MCG tablet medication is not available.  She would like to know what she is suppose to take now.  Patient will be out of the medication the first of next week.  Please advise.    Also the patient has been complaining of neck pain.  The daughter would like to know if something could be causing that pain, and what should they do about it.

## 2019-03-20 ENCOUNTER — Inpatient Hospital Stay: Payer: Medicare Other

## 2019-03-20 ENCOUNTER — Encounter: Payer: Self-pay | Admitting: Hematology & Oncology

## 2019-03-20 ENCOUNTER — Other Ambulatory Visit: Payer: Self-pay

## 2019-03-20 ENCOUNTER — Inpatient Hospital Stay: Payer: Medicare Other | Attending: Hematology & Oncology | Admitting: Hematology & Oncology

## 2019-03-20 VITALS — BP 120/78 | HR 68 | Temp 97.8°F | Resp 16 | Wt 121.0 lb

## 2019-03-20 DIAGNOSIS — R5383 Other fatigue: Secondary | ICD-10-CM | POA: Insufficient documentation

## 2019-03-20 DIAGNOSIS — R079 Chest pain, unspecified: Secondary | ICD-10-CM | POA: Diagnosis not present

## 2019-03-20 DIAGNOSIS — R531 Weakness: Secondary | ICD-10-CM | POA: Diagnosis not present

## 2019-03-20 DIAGNOSIS — Z79899 Other long term (current) drug therapy: Secondary | ICD-10-CM | POA: Insufficient documentation

## 2019-03-20 DIAGNOSIS — J90 Pleural effusion, not elsewhere classified: Secondary | ICD-10-CM | POA: Insufficient documentation

## 2019-03-20 DIAGNOSIS — R0602 Shortness of breath: Secondary | ICD-10-CM | POA: Insufficient documentation

## 2019-03-20 DIAGNOSIS — R918 Other nonspecific abnormal finding of lung field: Secondary | ICD-10-CM | POA: Diagnosis not present

## 2019-03-20 DIAGNOSIS — R911 Solitary pulmonary nodule: Secondary | ICD-10-CM

## 2019-03-20 DIAGNOSIS — Z8572 Personal history of non-Hodgkin lymphomas: Secondary | ICD-10-CM | POA: Insufficient documentation

## 2019-03-20 DIAGNOSIS — M791 Myalgia, unspecified site: Secondary | ICD-10-CM | POA: Insufficient documentation

## 2019-03-20 DIAGNOSIS — R634 Abnormal weight loss: Secondary | ICD-10-CM | POA: Diagnosis not present

## 2019-03-20 DIAGNOSIS — C8331 Diffuse large B-cell lymphoma, lymph nodes of head, face, and neck: Secondary | ICD-10-CM

## 2019-03-20 LAB — CMP (CANCER CENTER ONLY)
ALT: 19 U/L (ref 0–44)
AST: 26 U/L (ref 15–41)
Albumin: 3.7 g/dL (ref 3.5–5.0)
Alkaline Phosphatase: 87 U/L (ref 38–126)
Anion gap: 7 (ref 5–15)
BUN: 27 mg/dL — ABNORMAL HIGH (ref 8–23)
CO2: 34 mmol/L — ABNORMAL HIGH (ref 22–32)
Calcium: 11.5 mg/dL — ABNORMAL HIGH (ref 8.9–10.3)
Chloride: 97 mmol/L — ABNORMAL LOW (ref 98–111)
Creatinine: 1.59 mg/dL — ABNORMAL HIGH (ref 0.44–1.00)
GFR, Est AFR Am: 34 mL/min — ABNORMAL LOW (ref >60)
GFR, Estimated: 30 mL/min — ABNORMAL LOW (ref >60)
Glucose, Bld: 109 mg/dL — ABNORMAL HIGH (ref 70–99)
Potassium: 4.7 mmol/L (ref 3.5–5.1)
Sodium: 138 mmol/L (ref 135–145)
Total Bilirubin: 0.6 mg/dL (ref 0.3–1.2)
Total Protein: 9.3 g/dL — ABNORMAL HIGH (ref 6.5–8.1)

## 2019-03-20 LAB — LACTATE DEHYDROGENASE: LDH: 283 U/L — ABNORMAL HIGH (ref 98–192)

## 2019-03-20 LAB — CBC WITH DIFFERENTIAL (CANCER CENTER ONLY)
Abs Immature Granulocytes: 0 10*3/uL (ref 0.00–0.07)
Basophils Absolute: 0 10*3/uL (ref 0.0–0.1)
Basophils Relative: 1 %
Eosinophils Absolute: 0.1 10*3/uL (ref 0.0–0.5)
Eosinophils Relative: 2 %
HCT: 40.5 % (ref 36.0–46.0)
Hemoglobin: 12.8 g/dL (ref 12.0–15.0)
Immature Granulocytes: 0 %
Lymphocytes Relative: 50 %
Lymphs Abs: 1.8 10*3/uL (ref 0.7–4.0)
MCH: 28.6 pg (ref 26.0–34.0)
MCHC: 31.6 g/dL (ref 30.0–36.0)
MCV: 90.4 fL (ref 80.0–100.0)
Monocytes Absolute: 0.5 10*3/uL (ref 0.1–1.0)
Monocytes Relative: 15 %
Neutro Abs: 1.1 10*3/uL — ABNORMAL LOW (ref 1.7–7.7)
Neutrophils Relative %: 32 %
Platelet Count: 176 10*3/uL (ref 150–400)
RBC: 4.48 MIL/uL (ref 3.87–5.11)
RDW: 13.4 % (ref 11.5–15.5)
WBC Count: 3.6 10*3/uL — ABNORMAL LOW (ref 4.0–10.5)
nRBC: 0 % (ref 0.0–0.2)

## 2019-03-20 NOTE — Progress Notes (Signed)
Hematology and Oncology Follow Up Visit  Crystal [name] [id number] June 03, 1934 85 y.o. 03/20/2019   Principle Diagnosis:  Diffuse large cell non-Hodgkin's lymphoma-remission LEFT Lung mass  Current Therapy:   Observation   Interim History:  Crystal [name] is here today for follow-up.  Unfortunately, we still try to figure out what is going on with her lungs.  So far, we have not been all that successful.  She did have another PET scan done.  This was done on April 17.  She has new bilateral pleural effusions.  She has the stable cavitary mass in the left lung measuring 3.6 cm.  She has adenopathy in the mediastinum and bilateral hilar regions.  There is a 1.5 cm density the central right perihilar region which is increased from 8 mm.  I have spoken to thoracic surgery.  They do not want to do a mediastinoscopy on her.  They feel that another bronchoscopy would be reasonable.  She sees Dr. Roxan Hockey of thoracic surgery in a couple weeks.  He is to schedule a CT scan of the chest that has high resolution.  She is lost 9 pounds since we saw her.  She says she is eating okay.  She has had no nausea or vomiting.  She has had no cough or shortness of breath.  She has had no rashes.  She has had no diarrhea.  She has had no leg swelling.  It sounds like some of her medications are being adjusted by cardiology.  Overall, I was there performed status is ECOG 2.    Medications:  Allergies as of 03/20/2019      Reactions   Lisinopril Swelling   Facial and lip swelling   Iohexol Itching         Medication List       Accurate as of Mar 20, 2019  1:49 PM. Always use your most recent med list.        AeroChamber MV inhaler Use as instructed   albuterol 108 (90 Base) MCG/ACT inhaler Commonly known as:  VENTOLIN HFA Inhale 1-2 puffs into the lungs every 4 (four) hours as needed for wheezing or shortness of breath.   ALPRAZolam 0.25 MG tablet Commonly known as:  XANAX Take 1 tablet (0.25 mg  total) by mouth 2 (two) times daily as needed for anxiety.   benzonatate 100 MG capsule Commonly known as:  TESSALON Take 1-2 capsules (100-200 mg total) by mouth 3 (three) times daily as needed for cough.   carvedilol 6.25 MG tablet Commonly known as:  COREG Take 1 tablet (6.25 mg total) by mouth 2 (two) times daily.   CENTRUM PO Take 1 tablet by mouth daily.   Fish Oil 1000 MG Caps Take 1,000 mg by mouth daily.   furosemide 40 MG tablet Commonly known as:  LASIX Take 1.5 tablets (60 mg total) by mouth daily. May addt'l 20mg  (1/2 tab) as needed for addt'l swelling in LE   levothyroxine 25 MCG tablet Commonly known as:  SYNTHROID Take 1 tablet (25 mcg total) by mouth daily before breakfast.   magnesium hydroxide 400 MG/5ML suspension Commonly known as:  MILK OF MAGNESIA Take 15 mLs by mouth daily as needed for mild constipation.   PROBIOTIC DAILY PO Take 1 capsule by mouth daily.       Allergies:  Allergies  Allergen Reactions  . Lisinopril Swelling    Facial and lip swelling  . Iohexol Itching         Past  Medical History, Surgical history, Social history, and Family History were reviewed and updated.  Review of Systems: Review of Systems  Constitutional: Positive for malaise/fatigue.  HENT: Negative.   Eyes: Negative.   Respiratory: Positive for shortness of breath and wheezing.   Cardiovascular: Positive for chest pain.  Gastrointestinal: Negative.   Genitourinary: Negative.   Musculoskeletal: Positive for myalgias.  Skin: Negative.   Neurological: Negative.   Endo/Heme/Allergies: Negative.   Psychiatric/Behavioral: Negative.       Physical Exam:  weight is 121 lb (54.9 kg). Her oral temperature is 97.8 F (36.6 C). Her blood pressure is 120/78 and her pulse is 68. Her respiration is 16 and oxygen saturation is 99%.   Wt Readings from Last 3 Encounters:  03/20/19 121 lb (54.9 kg)  03/02/19 127 lb (57.6 kg)  02/22/19 131 lb 3.2 oz (59.5 kg)     Physical Exam Vitals signs reviewed.  HENT:     Head: Normocephalic and atraumatic.  Eyes:     Pupils: Pupils are equal, round, and reactive to light.  Neck:     Musculoskeletal: Normal range of motion.  Cardiovascular:     Rate and Rhythm: Normal rate and regular rhythm.     Heart sounds: Normal heart sounds.     Comments: Card exam is regular rate and rhythm.  There may be some increase in rate.  There is no murmurs, rubs or bruits. Pulmonary:     Effort: Pulmonary effort is normal.     Breath sounds: Normal breath sounds.     Comments: Lung exam shows decent breath sounds bilaterally.  There is occasional wheezing.  There might be some slight decrease of breath sounds over on the left lung. Abdominal:     General: Bowel sounds are normal.     Palpations: Abdomen is soft.  Musculoskeletal: Normal range of motion.        General: No tenderness or deformity.  Lymphadenopathy:     Cervical: No cervical adenopathy.  Skin:    General: Skin is warm and dry.     Findings: No erythema or rash.  Neurological:     Mental Status: She is alert and oriented to person, place, and time.  Psychiatric:        Behavior: Behavior normal.        Thought Content: Thought content normal.        Judgment: Judgment normal.      Lab Results  Component Value Date   WBC 3.6 (L) 03/20/2019   HGB 12.8 03/20/2019   HCT 40.5 03/20/2019   MCV 90.4 03/20/2019   PLT 176 03/20/2019   No results found for: FERRITIN, IRON, TIBC, UIBC, IRONPCTSAT Lab Results  Component Value Date   RETICCTPCT 1.1 10/15/2011   RBC 4.48 03/20/2019   RETICCTABS 49.7 10/15/2011   No results found for: KPAFRELGTCHN, LAMBDASER, KAPLAMBRATIO No results found for: IGGSERUM, IGA, IGMSERUM No results found for: Odetta Pink, SPEI   Chemistry      Component Value Date/Time   NA 138 03/20/2019 1305   NA 141 03/10/2019 1018   NA 138 01/21/2017 1021   K 4.7 03/20/2019  1305   K 5.1 01/21/2017 1021   CL 97 (L) 03/20/2019 1305   CO2 34 (H) 03/20/2019 1305   CO2 30 (H) 01/21/2017 1021   BUN 27 (H) 03/20/2019 1305   BUN 24 03/10/2019 1018   BUN 30.0 (H) 01/21/2017 1021   CREATININE 1.59 (H) 03/20/2019 1305  CREATININE 1.8 (H) 01/21/2017 1021      Component Value Date/Time   CALCIUM 11.5 (H) 03/20/2019 1305   CALCIUM 11.1 (H) 01/21/2017 1021   ALKPHOS 87 03/20/2019 1305   ALKPHOS 124 01/21/2017 1021   AST 26 03/20/2019 1305   AST 27 01/21/2017 1021   ALT 19 03/20/2019 1305   ALT 25 01/21/2017 1021   BILITOT 0.6 03/20/2019 1305   BILITOT 0.50 01/21/2017 1021       Impression and Plan: Crystal [name] is a very pleasant 85 yo African American female with history of diffuse large cell non-Hodgkin lymphoma diagnosed and treated over 18 years ago. So far there has been no evidence of recurrence.    Again, our question is what is going on with her lungs right now.  We really need to figure this out.  I think will be very interested to see what the high resolution CT scan of the chest shows.  This is to be done on May 19.  I told Crystal [name] that she really, really needs to make this CT scan so that other doctors can take a look and figure out what is going on.  Maybe she does have sarcoid.  I would think that recurrent lymphoma will be highly unlikely.  A bronchogenic carcinoma is always a concern.  I do think she ever smoked however.  At some point, a biopsy of something will have to be done.  I am not sure what the safest way is going to be to have this done.  I will plan to see her back in about 3 weeks.  We will have to see what Dr. Roxan Hockey has to say.    Volanda Napoleon, MD 5/4/20201:49 PM

## 2019-03-21 ENCOUNTER — Ambulatory Visit (INDEPENDENT_AMBULATORY_CARE_PROVIDER_SITE_OTHER): Payer: Medicare Other | Admitting: Family Medicine

## 2019-03-21 ENCOUNTER — Ambulatory Visit: Payer: Medicare Other | Admitting: Family Medicine

## 2019-03-21 VITALS — BP 120/78 | HR 68 | Temp 97.8°F | Wt 121.0 lb

## 2019-03-21 DIAGNOSIS — E039 Hypothyroidism, unspecified: Secondary | ICD-10-CM | POA: Diagnosis not present

## 2019-03-21 DIAGNOSIS — E559 Vitamin D deficiency, unspecified: Secondary | ICD-10-CM | POA: Diagnosis not present

## 2019-03-21 DIAGNOSIS — R739 Hyperglycemia, unspecified: Secondary | ICD-10-CM | POA: Diagnosis not present

## 2019-03-21 DIAGNOSIS — J984 Other disorders of lung: Secondary | ICD-10-CM | POA: Diagnosis not present

## 2019-03-21 DIAGNOSIS — I5022 Chronic systolic (congestive) heart failure: Secondary | ICD-10-CM | POA: Diagnosis not present

## 2019-03-21 DIAGNOSIS — N183 Chronic kidney disease, stage 3 unspecified: Secondary | ICD-10-CM

## 2019-03-21 NOTE — Assessment & Plan Note (Signed)
Check PTH and Vitamin D level with labs on 6/17

## 2019-03-21 NOTE — Progress Notes (Signed)
Virtual Visit via Telephone Note  I connected with Crystal [name] on 03/21/19 at  2:00 PM EDT by a telephone enabled telemedicine application and verified that I am speaking with the correct person using two identifiers.  Location: Patient: home Provider: home   I discussed the limitations of evaluation and management by telemedicine and the availability of in person appointments. The patient expressed understanding and agreed to proceed. Magdalene Molly, CMA attempted to get the patient set up on a video visit but she was unable to complete so visit was completed via telephone.     Subjective:    Patient ID: Crystal [name], female    DOB: September 21, 1934, 85 y.o.   MRN: [id number]  No chief complaint on file.   HPI Patient is in today for follow up on chronic medical concerns including a lung mass, chf, weight loss, hypertension and hypothyroidism. She is accompanied by her daughter. She is awaiting a high resolution CT and consultation with cardiothoracic surgeon to try and determine diagnosis. She denies any sob, cough, chest pain. She is still eating but has been loosing some weight. She had her Lasix increased to 60 mg and she denies any trouble with the change. Denies CP/palp/SOB/HA/congestion/fevers/GI or GU c/o. Taking meds as prescribed  Past Medical History:  Diagnosis Date  . Anemia 08/21/2017  . Anxiety   . Cardiomyopathy    non ischemic NL cos on cath 08/2009, EF to 15-20%, Her follow up  2D echo  in 10/2009 showed impoved EFo 35-40%  . CHF (congestive heart failure) ([place])   . Depression   . Full dentures   . Hypercalcemia 04/20/2017  . Hypertension   . Hypothyroid   . Lung mass   . Medicare annual wellness visit, subsequent 12/14/2014   Follows with Dr Marin Olp Follows with Dr Deatra Ina, gastroenterology, last colonoscopy in 2012, repeat in 2017 Last Pap 2011, always normal, no need for repeat Last MGM in 2011, no concerns, declines for now Follows with Dr Stanford Breed of cardiology    . Non Hodgkin's lymphoma ([place]) 11/17/1999   -Dr. Marin Olp  . Vitamin D deficiency 10/17/2015  . Wears glasses     Past Surgical History:  Procedure Laterality Date  . DILATION AND CURETTAGE OF UTERUS    . MULTIPLE TOOTH EXTRACTIONS    . NASAL POLYP EXCISION  1962  . TONSILLECTOMY  1952  . VIDEO BRONCHOSCOPY WITH ENDOBRONCHIAL NAVIGATION Right 06/29/2018   Procedure: VIDEO BRONCHOSCOPY WITH ENDOBRONCHIAL NAVIGATION;  Surgeon: Collene Gobble, MD;  Location: [facility];  Service: Thoracic;  Laterality: Right;  Marland Kitchen VIDEO BRONCHOSCOPY WITH ENDOBRONCHIAL ULTRASOUND Right 06/29/2018   Procedure: VIDEO BRONCHOSCOPY WITH ENDOBRONCHIAL ULTRASOUND;  Surgeon: Collene Gobble, MD;  Location: [facility];  Service: Thoracic;  Laterality: Right;    Family History  Problem Relation Age of Onset  . Diabetes Mother        deceased secondary to diabetes  . Hypertension Mother   . Vision loss Mother   . Pneumonia Father        died age 58 due to complications of pneumonia  . Colon polyps Father   . Liver disease Daughter   . Sarcoidosis Daughter   . Diabetes Sister   . Colon cancer Brother 32  . Cancer Brother   . Obesity Son   . Diabetes Brother   . Kidney disease Brother   . Diabetes Sister   . Sarcoidosis Sister   . Arthritis Sister   . Arthritis Sister   .  Diabetes Sister   . Arthritis Sister   . Diabetes Sister   . Hypertension Daughter   . Hypertension Daughter   . Other Other        no obvious premature cardiovascular disease or familial cardiomyopathy is noted  . Alcohol abuse Neg Hx   . Heart disease Neg Hx     Social History   Socioeconomic History  . Marital status: Married    Spouse name: Jeneen Rinks  . Number of children: 5  . Years of education: Not on file  . Highest education level: Not on file  Occupational History  . Occupation: retired  Scientific laboratory technician  . Financial resource strain: Not on file  . Food insecurity:    Worry: Not on file    Inability: Not on file  .  Transportation needs:    Medical: Not on file    Non-medical: Not on file  Tobacco Use  . Smoking status: Never Smoker  . Smokeless tobacco: Never Used  Substance and Sexual Activity  . Alcohol use: Never    Alcohol/week: 0.0 standard drinks    Frequency: Never  . Drug use: Never  . Sexual activity: Yes    Comment: lives with husband, no dietary restrictions, minimizes dairy  Lifestyle  . Physical activity:    Days per week: Not on file    Minutes per session: Not on file  . Stress: Not on file  Relationships  . Social connections:    Talks on phone: Not on file    Gets together: Not on file    Attends religious service: Not on file    Active member of club or organization: Not on file    Attends meetings of clubs or organizations: Not on file    Relationship status: Not on file  . Intimate partner violence:    Fear of current or ex partner: Not on file    Emotionally abused: Not on file    Physically abused: Not on file    Forced sexual activity: Not on file  Other Topics Concern  . Not on file  Social History Narrative   The patient is married ( husband Ayisha Pol)  She just recently celebrated     her 52nd anniversary.  She has five children.  There is no active     tobacco or alcohol use history.          Smoking Status:  never   Caffeine use/day:  None   Does Patient Exercise:  yes    Outpatient Medications Prior to Visit  Medication Sig Dispense Refill  . albuterol (PROVENTIL HFA;VENTOLIN HFA) 108 (90 Base) MCG/ACT inhaler Inhale 1-2 puffs into the lungs every 4 (four) hours as needed for wheezing or shortness of breath. 1 Inhaler 2  . ALPRAZolam (XANAX) 0.25 MG tablet Take 1 tablet (0.25 mg total) by mouth 2 (two) times daily as needed for anxiety. 30 tablet 1  . benzonatate (TESSALON) 100 MG capsule Take 1-2 capsules (100-200 mg total) by mouth 3 (three) times daily as needed for cough. 60 capsule 1  . carvedilol (COREG) 6.25 MG tablet Take 1 tablet (6.25 mg  total) by mouth 2 (two) times daily. 180 tablet 1  . furosemide (LASIX) 40 MG tablet Take 1.5 tablets (60 mg total) by mouth daily. May addt'l '20mg'$  (1/2 tab) as needed for addt'l swelling in LE 180 tablet 3  . levothyroxine (SYNTHROID) 25 MCG tablet Take 1 tablet (25 mcg total) by mouth daily before breakfast. 90  tablet 0  . magnesium hydroxide (MILK OF MAGNESIA) 400 MG/5ML suspension Take 15 mLs by mouth daily as needed for mild constipation.    . Multiple Vitamins-Minerals (CENTRUM PO) Take 1 tablet by mouth daily.     . Omega-3 Fatty Acids (FISH OIL) 1000 MG CAPS Take 1,000 mg by mouth daily.    . Probiotic Product (PROBIOTIC DAILY PO) Take 1 capsule by mouth daily.     Marland Kitchen Spacer/Aero-Holding Chambers (AEROCHAMBER MV) inhaler Use as instructed 1 each 0   No facility-administered medications prior to visit.     Allergies  Allergen Reactions  . Lisinopril Swelling    Facial and lip swelling  . Iohexol Itching         ROS     Objective:    Physical Exam  BP 120/78 (BP Location: Left Arm, Patient Position: Sitting, Cuff Size: Normal)   Pulse 68   Temp 97.8 F (36.6 C) (Oral)   Wt 121 lb (54.9 kg)   BMI 21.43 kg/m  Wt Readings from Last 3 Encounters:  03/21/19 121 lb (54.9 kg)  03/20/19 121 lb (54.9 kg)  03/02/19 127 lb (57.6 kg)    Diabetic Foot Exam - Simple   No data filed     Lab Results  Component Value Date   WBC 3.6 (L) 03/20/2019   HGB 12.8 03/20/2019   HCT 40.5 03/20/2019   PLT 176 03/20/2019   GLUCOSE 109 (H) 03/20/2019   CHOL 172 08/29/2018   TRIG 98.0 08/29/2018   HDL 55.90 08/29/2018   LDLCALC 97 08/29/2018   ALT 19 03/20/2019   AST 26 03/20/2019   NA 138 03/20/2019   K 4.7 03/20/2019   CL 97 (L) 03/20/2019   CREATININE 1.59 (H) 03/20/2019   BUN 27 (H) 03/20/2019   CO2 34 (H) 03/20/2019   TSH 4.755 (H) 11/29/2018   INR 1.3 (H) 01/12/2019   HGBA1C 5.5 08/29/2018    Lab Results  Component Value Date   TSH 4.755 (H) 11/29/2018   Lab  Results  Component Value Date   WBC 3.6 (L) 03/20/2019   HGB 12.8 03/20/2019   HCT 40.5 03/20/2019   MCV 90.4 03/20/2019   PLT 176 03/20/2019   Lab Results  Component Value Date   NA 138 03/20/2019   K 4.7 03/20/2019   CHLORIDE 101 01/21/2017   CO2 34 (H) 03/20/2019   GLUCOSE 109 (H) 03/20/2019   BUN 27 (H) 03/20/2019   CREATININE 1.59 (H) 03/20/2019   BILITOT 0.6 03/20/2019   ALKPHOS 87 03/20/2019   AST 26 03/20/2019   ALT 19 03/20/2019   PROT 9.3 (H) 03/20/2019   ALBUMIN 3.7 03/20/2019   CALCIUM 11.5 (H) 03/20/2019   ANIONGAP 7 03/20/2019   EGFR 29 (L) 01/21/2017   GFR 52.68 (L) 08/29/2018   Lab Results  Component Value Date   CHOL 172 08/29/2018   Lab Results  Component Value Date   HDL 55.90 08/29/2018   Lab Results  Component Value Date   LDLCALC 97 08/29/2018   Lab Results  Component Value Date   TRIG 98.0 08/29/2018   Lab Results  Component Value Date   CHOLHDL 3 08/29/2018   Lab Results  Component Value Date   HGBA1C 5.5 08/29/2018       Assessment & Plan:   Problem List Items Addressed This Visit    Hyperglycemia    With weight loss encouraged patient not to worry too much about carbohydrate intake minimize simple carbs. Increase  exercise as tolerated. Continue current meds      Vitamin D deficiency    Supplement and monitor      Chronic systolic heart failure (HCC)    Has tolerated the increase in Furosemide to 60 mg daily      Hypercalcemia    Check PTH and Vitamin D level with labs on 6/17      Cavitating mass in left upper lung lobe    Is awaiting hi resolution CT chest and consultaiton with cardiothoracic surgeon      CKD (chronic kidney disease) stage 3, GFR 30-59 ml/min (HCC)    Hydrate and monitor         I am having Pleasant E. Dobesh maintain her Multiple Vitamins-Minerals (CENTRUM PO), Probiotic Product (PROBIOTIC DAILY PO), Fish Oil, magnesium hydroxide, albuterol, ALPRAZolam, AeroChamber MV, benzonatate,  carvedilol, furosemide, and levothyroxine.  No orders of the defined types were placed in this encounter.     I discussed the assessment and treatment plan with the patient. The patient was provided an opportunity to ask questions and all were answered. The patient agreed with the plan and demonstrated an understanding of the instructions.   The patient was advised to call back or seek an in-person evaluation if the symptoms worsen or if the condition fails to improve as anticipated.  I provided 25 minutes of non-face-to-face time during this encounter.   Penni Homans, MD

## 2019-03-21 NOTE — Assessment & Plan Note (Signed)
Is awaiting hi resolution CT chest and consultaiton with cardiothoracic surgeon

## 2019-03-21 NOTE — Assessment & Plan Note (Signed)
Supplement and monitor 

## 2019-03-21 NOTE — Assessment & Plan Note (Signed)
With weight loss encouraged patient not to worry too much about carbohydrate intake minimize simple carbs. Increase exercise as tolerated. Continue current meds

## 2019-03-21 NOTE — Assessment & Plan Note (Signed)
Has tolerated the increase in Furosemide to 60 mg daily

## 2019-03-21 NOTE — Assessment & Plan Note (Signed)
Hydrate and monitor 

## 2019-03-28 DIAGNOSIS — I5022 Chronic systolic (congestive) heart failure: Secondary | ICD-10-CM | POA: Diagnosis not present

## 2019-03-28 LAB — BASIC METABOLIC PANEL
BUN/Creatinine Ratio: 14 (ref 12–28)
BUN: 24 mg/dL (ref 8–27)
CO2: 26 mmol/L (ref 20–29)
Calcium: 10.7 mg/dL — ABNORMAL HIGH (ref 8.7–10.3)
Chloride: 95 mmol/L — ABNORMAL LOW (ref 96–106)
Creatinine, Ser: 1.67 mg/dL — ABNORMAL HIGH (ref 0.57–1.00)
GFR calc Af Amer: 32 mL/min/{1.73_m2} — ABNORMAL LOW (ref >59)
GFR calc non Af Amer: 28 mL/min/{1.73_m2} — ABNORMAL LOW (ref >59)
Glucose: 139 mg/dL — ABNORMAL HIGH (ref 65–99)
Potassium: 5 mmol/L (ref 3.5–5.2)
Sodium: 136 mmol/L (ref 134–144)

## 2019-03-30 ENCOUNTER — Telehealth (INDEPENDENT_AMBULATORY_CARE_PROVIDER_SITE_OTHER): Payer: Medicare Other | Admitting: Cardiology

## 2019-03-30 ENCOUNTER — Encounter: Payer: Self-pay | Admitting: Cardiology

## 2019-03-30 VITALS — BP 84/72 | HR 85 | Ht 63.0 in | Wt 120.0 lb

## 2019-03-30 DIAGNOSIS — I10 Essential (primary) hypertension: Secondary | ICD-10-CM

## 2019-03-30 DIAGNOSIS — I5022 Chronic systolic (congestive) heart failure: Secondary | ICD-10-CM

## 2019-03-30 DIAGNOSIS — I428 Other cardiomyopathies: Secondary | ICD-10-CM

## 2019-03-30 MED ORDER — CARVEDILOL 3.125 MG PO TABS: 3.1250 mg | ORAL_TABLET | Freq: Two times a day (BID) | ORAL | Status: DC

## 2019-03-30 NOTE — Progress Notes (Signed)
Virtual Visit via Video Note changed to phone visit at patient request as no smart phone   This visit type was conducted due to national recommendations for restrictions regarding the COVID-19 Pandemic (e.g. social distancing) in an effort to limit this patient's exposure and mitigate transmission in our community.  Due to her co-morbid illnesses, this patient is at least at moderate risk for complications without adequate follow up.  This format is felt to be most appropriate for this patient at this time.  All issues noted in this document were discussed and addressed.  A limited physical exam was performed with this format.  Please refer to the patient's chart for her consent to telehealth for [facility].   Date:  03/30/2019   ID:  Crystal [name], DOB December 09, 1933, MRN [id number]  Patient Location: Home Provider Location: Home  PCP:  Mosie Lukes, MD  Cardiologist:  [name], MD   Evaluation Performed:  Follow-Up Visit  Chief Complaint:  FU CHF  History of Present Illness:    FU nonischemic cardiomyopathy. Cardiac catheterization in 2010 showed an ejection fraction of 15-20% and normal coronary arteries. Patient admitted with syncope previouslyfelt to potentially be vagally mediated. However given reduced LV function I discussed the possibility of of life vest or ICD. However she declined and preferred only medical therapy. The risk of sudden death was explained. Chest CT 07-07-23 showed left apical mass with pleural mets and pulmonary mets RLL. Followed by Dr Marin Olp and Dr Roxan Hockey. Last echo1/20showed EF 35-40, grade2DD, severe LAE and moderate RV dysfunction. PET scan April 2020 showed airspace disease in the right upper lobe and bilateral pleural effusions. Diagnostic thoracentesis recommended.  There was a 3.6 cm hypermetabolic cavitary mass in the left lung apex.  There was also note of lymphadenopathy.  We increased diuretics at time of last office visit.  High  resolution CT and follow-up with Dr. Roxan Hockey is pending.  Since last seen,patient states her dyspnea has improved.  She denies pedal edema, chest pain or syncope.  The patient does not have symptoms concerning for COVID-19 infection (fever, chills, cough, or new shortness of breath).    Past Medical History:  Diagnosis Date  . Anemia 08/21/2017  . Anxiety   . Cardiomyopathy    non ischemic NL cos on cath 08/2009, EF to 15-20%, Her follow up  2D echo  in 10/2009 showed impoved EFo 35-40%  . CHF (congestive heart failure) ([place])   . Depression   . Full dentures   . Hypercalcemia 04/20/2017  . Hypertension   . Hypothyroid   . Lung mass   . Medicare annual wellness visit, subsequent 12/14/2014   Follows with Dr Marin Olp Follows with Dr Deatra Ina, gastroenterology, last colonoscopy in 2012, repeat in 2017 Last Pap 2011, always normal, no need for repeat Last MGM in 2011, no concerns, declines for now Follows with Dr Stanford Breed of cardiology   . Non Hodgkin's lymphoma ([place]) 11/17/1999   -Dr. Marin Olp  . Vitamin D deficiency 10/17/2015  . Wears glasses    Past Surgical History:  Procedure Laterality Date  . DILATION AND CURETTAGE OF UTERUS    . MULTIPLE TOOTH EXTRACTIONS    . NASAL POLYP EXCISION  1962  . TONSILLECTOMY  1952  . VIDEO BRONCHOSCOPY WITH ENDOBRONCHIAL NAVIGATION Right 06/29/2018   Procedure: VIDEO BRONCHOSCOPY WITH ENDOBRONCHIAL NAVIGATION;  Surgeon: Collene Gobble, MD;  Location: [facility];  Service: Thoracic;  Laterality: Right;  Marland Kitchen VIDEO BRONCHOSCOPY WITH ENDOBRONCHIAL ULTRASOUND Right 06/29/2018  Procedure: VIDEO BRONCHOSCOPY WITH ENDOBRONCHIAL ULTRASOUND;  Surgeon: Collene Gobble, MD;  [facility];  Service: Thoracic;  Laterality: Right;     Current Meds  Medication Sig  . albuterol (PROVENTIL HFA;VENTOLIN HFA) 108 (90 Base) MCG/ACT inhaler Inhale 1-2 puffs into the lungs every 4 (four) hours as needed for wheezing or shortness of breath.  . ALPRAZolam (XANAX) 0.25 MG  tablet Take 1 tablet (0.25 mg total) by mouth 2 (two) times daily as needed for anxiety.  . carvedilol (COREG) 6.25 MG tablet Take 1 tablet (6.25 mg total) by mouth 2 (two) times daily.  . furosemide (LASIX) 40 MG tablet Take 1.5 tablets (60 mg total) by mouth daily. May addt'l 20mg  (1/2 tab) as needed for addt'l swelling in LE (Patient taking differently: Take 40 mg by mouth daily. May addt'l 20mg  (1/2 tab) as needed for addt'l swelling in LE)  . levothyroxine (SYNTHROID) 25 MCG tablet Take 1 tablet (25 mcg total) by mouth daily before breakfast.  . magnesium hydroxide (MILK OF MAGNESIA) 400 MG/5ML suspension Take 15 mLs by mouth daily as needed for mild constipation.  . Multiple Vitamins-Minerals (CENTRUM PO) Take 1 tablet by mouth daily.   . Omega-3 Fatty Acids (FISH OIL) 1000 MG CAPS Take 1,000 mg by mouth daily.  . Probiotic Product (PROBIOTIC DAILY PO) Take 1 capsule by mouth daily.   Marland Kitchen Spacer/Aero-Holding Chambers (AEROCHAMBER MV) inhaler Use as instructed     Allergies:   Lisinopril and Iohexol   Social History   Tobacco Use  . Smoking status: Never Smoker  . Smokeless tobacco: Never Used  Substance Use Topics  . Alcohol use: Never    Alcohol/week: 0.0 standard drinks    Frequency: Never  . Drug use: Never     Family Hx: The patient's family history includes Arthritis in her sister, sister, and sister; Cancer in her brother; Colon cancer (age of onset: 5) in her brother; Colon polyps in her father; Diabetes in her brother, mother, sister, sister, sister, and sister; Hypertension in her daughter, daughter, and mother; Kidney disease in her brother; Liver disease in her daughter; Obesity in her son; Other in an other family member; Pneumonia in her father; Sarcoidosis in her daughter and sister; Vision loss in her mother. There is no history of Alcohol abuse or Heart disease.  ROS:   Please see the history of present illness.    No fevers, chills or productive cough. All other  systems reviewed and are negative.  Recent Labs: 11/29/2018: TSH 4.755 01/12/2019: B Natriuretic Peptide 3,955.3 01/15/2019: Magnesium 1.9 03/20/2019: ALT 19; Hemoglobin 12.8; Platelet Count 176 03/28/2019: BUN 24; Creatinine, Ser 1.67; Potassium 5.0; Sodium 136   Recent Lipid Panel Lab Results  Component Value Date/Time   CHOL 172 08/29/2018 11:10 AM   TRIG 98.0 08/29/2018 11:10 AM   HDL 55.90 08/29/2018 11:10 AM   CHOLHDL 3 08/29/2018 11:10 AM   LDLCALC 97 08/29/2018 11:10 AM    Wt Readings from Last 3 Encounters:  03/30/19 120 lb (54.4 kg)  03/21/19 121 lb (54.9 kg)  03/20/19 121 lb (54.9 kg)     Objective:    Vital Signs:  BP (!) 84/72   Pulse 85   Ht 5\' 3"  (1.6 m)   Wt 120 lb (54.4 kg)   BMI 21.26 kg/m    VITAL SIGNS:  reviewed  ASSESSMENT & PLAN:    1. Chronic systolic congestive heart failure-by history patient symptoms are much improved.  She has decreased Lasix back  to her original dose of 40 mg daily.  I have asked her to take an additional 20 mg daily for worsening dyspnea or lower extremity edema.  She needs to follow with low-sodium diet and continue with fluid restriction. 2. Nonischemic cardiomyopathy-Patient had angioedema with ACE inhibitor's previously.  She did not tolerate hydralazine/nitrates as this caused hypotension.  Her blood pressure is low again today.  Decrease carvedilol to 3.125 mg twice daily and may need to discontinue.  She declined ICD previously. 3. Hypertension-patient's blood pressure is now low.  Plan as outlined above. 4. Lung mass-followed by Dr. Roxan Hockey and Dr. Marin Olp.  COVID-19 Education: The importance of social distancing was discussed today.  Time:   Today, I have spent 10 minutes with the patient with telehealth technology discussing the above problems.     Medication Adjustments/Labs and Tests Ordered: Current medicines are reviewed at length with the patient today.  Concerns regarding medicines are outlined above.    Tests Ordered: No orders of the defined types were placed in this encounter.   Medication Changes: No orders of the defined types were placed in this encounter.   Disposition:  Follow up in 3 month(s)  Signed, [name], MD  03/30/2019 10:26 AM    [place]

## 2019-03-30 NOTE — Patient Instructions (Signed)
Medication Instructions:  DECREASE CARVEDILOL TO 3.125 MG TWICE DAILY= 1/2 OF THE 6.25 MG TABLETS TWICE DAILY  TAKE AN EXTRA 20 MG OF FUROSEMIDE ONCE DAILY AS NEEDED FOR SHORTNESS OF BREATH OR SWELLING If you need a refill on your cardiac medications before your next appointment, please call your pharmacy.   Lab work: If you have labs (blood work) drawn today and your tests are completely normal, you will receive your results only by: Marland Kitchen MyChart Message (if you have MyChart) OR . A paper copy in the mail If you have any lab test that is abnormal or we need to change your treatment, we will call you to review the results.  Follow-Up: At [facility], you and your health needs are our priority.  As part of our continuing mission to provide you with exceptional heart care, we have created designated Provider Care Teams.  These Care Teams include your primary Cardiologist (physician) and Advanced Practice Providers (APPs -  Physician Assistants and Nurse Practitioners) who all work together to provide you with the care you need, when you need it. Your physician recommends that you schedule a follow-up appointment in: [place]

## 2019-04-03 ENCOUNTER — Other Ambulatory Visit: Payer: Medicare Other

## 2019-04-04 ENCOUNTER — Ambulatory Visit
Admission: RE | Admit: 2019-04-04 | Discharge: 2019-04-04 | Disposition: A | Discharge status: Home / Self Care | Payer: Medicare Other | Source: Ambulatory Visit | Attending: Thoracic Surgery (Cardiothoracic Vascular Surgery) | Admitting: Thoracic Surgery (Cardiothoracic Vascular Surgery)

## 2019-04-04 ENCOUNTER — Ambulatory Visit: Payer: Medicare Other | Admitting: Thoracic Surgery (Cardiothoracic Vascular Surgery)

## 2019-04-04 ENCOUNTER — Other Ambulatory Visit: Payer: Self-pay

## 2019-04-04 DIAGNOSIS — Z01818 Encounter for other preprocedural examination: Secondary | ICD-10-CM

## 2019-04-04 DIAGNOSIS — R918 Other nonspecific abnormal finding of lung field: Secondary | ICD-10-CM | POA: Diagnosis not present

## 2019-04-04 DIAGNOSIS — J90 Pleural effusion, not elsewhere classified: Secondary | ICD-10-CM

## 2019-04-05 ENCOUNTER — Telehealth: Payer: Self-pay | Admitting: Family Medicine

## 2019-04-05 NOTE — Telephone Encounter (Signed)
Copied from [place] [id number]. Topic: Quick Communication - Rx Refill/Question >> Apr 05, 2019 12:39 PM Margot Ables wrote: Medication: benzonatate (TESSALON) 100 MG capsule  - pt was using the "maximum needed amount" but then was taking 2-3/day. Pt is out and her daughter Olin Hauser is asking for a refill. Now just needing PRN.   Has the patient contacted their pharmacy? Yes - no refills Preferred Pharmacy (with phone number or street name): [facility] [address] [place] [phone] (Phone) [phone] (Fax)

## 2019-04-05 NOTE — Telephone Encounter (Signed)
Please advise 

## 2019-04-06 ENCOUNTER — Telehealth: Payer: Medicare Other | Admitting: Cardiology

## 2019-04-06 ENCOUNTER — Other Ambulatory Visit: Payer: Self-pay | Admitting: Family Medicine

## 2019-04-06 MED ORDER — BENZONATATE 100 MG PO CAPS: 100.0000 mg | ORAL_CAPSULE | Freq: Three times a day (TID) | ORAL | 1 refills | Status: DC | PRN

## 2019-04-06 NOTE — Telephone Encounter (Signed)
I sent in refills OK to let her know

## 2019-04-17 ENCOUNTER — Other Ambulatory Visit: Payer: Self-pay

## 2019-04-18 ENCOUNTER — Other Ambulatory Visit: Payer: Self-pay | Admitting: *Deleted

## 2019-04-18 ENCOUNTER — Encounter: Payer: Self-pay | Admitting: Thoracic Surgery (Cardiothoracic Vascular Surgery)

## 2019-04-18 ENCOUNTER — Ambulatory Visit (INDEPENDENT_AMBULATORY_CARE_PROVIDER_SITE_OTHER): Payer: Medicare Other | Admitting: Thoracic Surgery (Cardiothoracic Vascular Surgery)

## 2019-04-18 VITALS — BP 128/83 | HR 99 | Temp 97.7°F | Resp 20 | Ht 63.0 in | Wt 118.6 lb

## 2019-04-18 DIAGNOSIS — R599 Enlarged lymph nodes, unspecified: Secondary | ICD-10-CM

## 2019-04-18 DIAGNOSIS — R590 Localized enlarged lymph nodes: Secondary | ICD-10-CM | POA: Diagnosis not present

## 2019-04-18 DIAGNOSIS — Z8572 Personal history of non-Hodgkin lymphomas: Secondary | ICD-10-CM

## 2019-04-18 DIAGNOSIS — R591 Generalized enlarged lymph nodes: Secondary | ICD-10-CM

## 2019-04-18 DIAGNOSIS — R918 Other nonspecific abnormal finding of lung field: Secondary | ICD-10-CM | POA: Diagnosis not present

## 2019-04-18 DIAGNOSIS — J90 Pleural effusion, not elsewhere classified: Secondary | ICD-10-CM | POA: Diagnosis not present

## 2019-04-18 NOTE — Progress Notes (Signed)
[address]       [place] [address]             [phone]     HPI: [name] returns for a scheduled follow-up visit  Crystal [name] is an 85 year old woman with a history of non-Hodgkin's lymphoma, nonischemic cardiomyopathy, chronic systolic left heart failure, hypertension, hypothyroidism, anxiety, depression, and anemia. In July 2018 she presented with neck pain.  A CT of the neck showed a left apical abnormality.  A chest CT showed a 3.9 x 2.9 cm cavitary lesion in the left apex.  She also had bilateral hilar and mediastinal adenopathy and multiple other lung nodules.  Dr. Lamonte Sakai did a bronchoscopy and endobronchial ultrasound.  Samples were nondiagnostic.  In January 2020 she was admitted after a near syncopal episode.  She was in decompensated heart failure.  A CT showed left apical mass and adenopathy were unchanged to slightly smaller.  She had bilateral pleural effusions.  She then was referred for possible mediastinal ostomy.  I saw her in April.  I did fungal antibody and QuantiFERON testing.  That all returned negative.  She was in decompensated heart failure and with the COVID situation we felt it was best to defer biopsy.  She feels much better.  Her shortness of breath is improved significantly.  She does still have some swelling in her legs.  She had a virtual visit with Dr. Stanford Breed couple of weeks ago.  Past Medical History:  Diagnosis Date  . Anemia 08/21/2017  . Anxiety   . Cardiomyopathy    non ischemic NL cos on cath 08/2009, EF to 15-20%, Her follow up  2D echo  in 10/2009 showed impoved EFo 35-40%  . CHF (congestive heart failure) ([place])   . Depression   . Full dentures   . Hypercalcemia 04/20/2017  . Hypertension   . Hypothyroid   . Lung mass   . Medicare annual wellness visit, subsequent 12/14/2014   Follows with Dr Marin Olp Follows with Dr Deatra Ina, gastroenterology, last colonoscopy in 2012, repeat in 2017 Last Pap 2011, always normal, no  need for repeat Last MGM in 2011, no concerns, declines for now Follows with Dr Stanford Breed of cardiology   . Non Hodgkin's lymphoma ([place]) 11/17/1999   -Dr. Marin Olp  . Vitamin D deficiency 10/17/2015  . Wears glasses    Past Surgical History:  Procedure Laterality Date  . DILATION AND CURETTAGE OF UTERUS    . MULTIPLE TOOTH EXTRACTIONS    . NASAL POLYP EXCISION  1962  . TONSILLECTOMY  1952  . VIDEO BRONCHOSCOPY WITH ENDOBRONCHIAL NAVIGATION Right 06/29/2018   Procedure: VIDEO BRONCHOSCOPY WITH ENDOBRONCHIAL NAVIGATION;  Surgeon: Collene Gobble, MD;  Location: [facility];  Service: Thoracic;  Laterality: Right;  Marland Kitchen VIDEO BRONCHOSCOPY WITH ENDOBRONCHIAL ULTRASOUND Right 06/29/2018   Procedure: VIDEO BRONCHOSCOPY WITH ENDOBRONCHIAL ULTRASOUND;  Surgeon: Collene Gobble, MD;  [facility];  Service: Thoracic;  Laterality: Right;     Current Outpatient Medications  Medication Sig Dispense Refill  . albuterol (PROVENTIL HFA;VENTOLIN HFA) 108 (90 Base) MCG/ACT inhaler Inhale 1-2 puffs into the lungs every 4 (four) hours as needed for wheezing or shortness of breath. 1 Inhaler 2  . ALPRAZolam (XANAX) 0.25 MG tablet Take 1 tablet (0.25 mg total) by mouth 2 (two) times daily as needed for anxiety. 30 tablet 1  . benzonatate (TESSALON) 100 MG capsule Take 1-2 capsules (100-200 mg total) by mouth 3 (three) times daily as needed  for cough. 60 capsule 1  . carvedilol (COREG) 3.125 MG tablet Take 1 tablet (3.125 mg total) by mouth 2 (two) times daily.    . furosemide (LASIX) 40 MG tablet Take 1.5 tablets (60 mg total) by mouth daily. May addt'l 20mg  (1/2 tab) as needed for addt'l swelling in LE (Patient taking differently: Take 40 mg by mouth daily. May addt'l 20mg  (1/2 tab) as needed for addt'l swelling in LE) 180 tablet 3  . levothyroxine (SYNTHROID) 25 MCG tablet Take 1 tablet (25 mcg total) by mouth daily before breakfast. 90 tablet 0  . magnesium hydroxide (MILK OF MAGNESIA) 400 MG/5ML suspension Take 15  mLs by mouth daily as needed for mild constipation.    . Multiple Vitamins-Minerals (CENTRUM PO) Take 1 tablet by mouth daily.     . Omega-3 Fatty Acids (FISH OIL) 1000 MG CAPS Take 1,000 mg by mouth daily.    . Probiotic Product (PROBIOTIC DAILY PO) Take 1 capsule by mouth daily.     Marland Kitchen Spacer/Aero-Holding Chambers (AEROCHAMBER MV) inhaler Use as instructed 1 each 0   No current facility-administered medications for this visit.     Physical Exam BP 128/83 (BP Location: Left Arm, Patient Position: Sitting, Cuff Size: Normal)   Pulse 99   Temp 97.7 F (36.5 C) (Skin)   Resp 20   Ht 5\' 3"  (1.6 m)   Wt 118 lb 9.6 oz (53.8 kg)   SpO2 94% Comment: RA  BMI 21.10 kg/m  85 year old woman in no acute distress Alert and oriented x3 with no focal deficits HEENT-PERRLA, wearing surgical mask Neck supple without palpable adenopathy Cardiac regular rate and rhythm, normal S1 and S2, positive S4 Trace edema in feet and ankles  Diagnostic Tests: CT CHEST WITHOUT CONTRAST  TECHNIQUE: Multidetector CT imaging of the chest was performed using thin slice collimation for electromagnetic bronchoscopy planning purposes, without intravenous contrast.  COMPARISON:  PET-CT 03/03/2019  FINDINGS: Cardiovascular: Cardiac enlargement. Aortic atherosclerosis. Lad and RCA coronary artery calcifications.  Mediastinum/Nodes: Normal appearance of the thyroid gland. The trachea appears patent and is midline. Normal appearance of the esophagus.  Enlarged and partially calcified mediastinal and hilar lymph nodes are identified. Index subcarinal lymph node measures 2.1 cm, image 56/2. Unchanged from previous exam. The index left hilar lymph node measures 2.7 cm, image 56/2. Unchanged. Index right paratracheal lymph node measures 1.8 cm, image 46/2. Previously 1.6 cm. Index left pre-vascular node measures 1.1 cm, image 43/2. Previously 1.2 cm.  Lungs/Pleura: Moderate bilateral pleural effusions are  identified. The left upper lobe cavitary lung mass is again identified. Bilateral upper lobe predominant interstitial accentuation and ground-glass attenuation is identified which is partially improved from 01/10/2011.  Again seen are scattered subpleural, perifissural and peribronchovascular nodules in both lungs. Perifissural nodule within the right middle lobe and right lower lobe are again noted.  -Index nodule in the right lower lobe measures 1.6 cm, image 60/3. Previously 1.5 cm.  -The index nodule within the right middle lobe measures 1.5 cm, image 59/3. Previously 1.4 cm.  -Subpleural nodule within the anterolateral left lower lobe measures 0.9 cm, image 58/3. Unchanged from previous exam.  -There is a subpleural nodule within the anterolateral right lower lobe measuring 0.4 cm, image 80/3. Unchanged.  Upper Abdomen: No acute abnormality.  Musculoskeletal: Spondylosis within the thoracic spine. No aggressive lytic or sclerotic bone lesions.  IMPRESSION: 1. Cavitary lung mass within the left upper lobe is not significantly changed in size when compared with 01/10/2019. 2. Similar appearance  of mediastinal and hilar adenopathy. 3. Unchanged bilateral pleural effusions.   Electronically Signed   By: Kerby Moors M.D.   On: 04/04/2019 10:42 I personally reviewed the CT images as well as the PET/CT images.  Impression: Crystal [name] is an 85 year old woman with a past medical history significant for non-Hodgkin's lymphoma in remission, nonischemic cardiomyopathy, chronic systolic left heart failure, hypertension, anxiety, depression, anemia, and hypothyroidism.  She has a cavitary left upper lobe mass and prominent hilar and mediastinal adenopathy.  These were first noted about a year ago and have been relatively stable since then.  Looking back at his CT from 2010 the left upper lobe process was present at that time, but has progressed significantly.  These  areas were hypermetabolic on PET/CT.  Previous bronchoscopy and endobronchial ultrasound were nondiagnostic.  Differential diagnosis includes primary bronchogenic carcinoma, other malignancies, granulomatous disease, and sarcoidosis.  Biopsy is warranted in order to guide appropriate therapy.  I recommended that we proceed with navigational bronchoscopy, endobronchial ultrasound, and possible mediastinoscopy.  The procedure was explained in detail to her.  She is previously had bronchoscopy and endobronchial ultrasound by Dr. Lamonte Sakai.  She understands the mediastinoscopy will only be done if the endobronchial ultrasound is not definitive.  I informed her of the indications, risk, benefits, and alternatives.  She understands the risk include, but are not limited to death, MI, DVT, PE, stroke, bleeding, possible need for transfusion, infection, pneumothorax, esophageal injury, recurrent nerve injury leading to hoarseness, as well as the possibility of other unforeseeable complications.  She accepts the risks and wishes to proceed.  Plan: Electromagnetic navigational bronchoscopy, endobronchial ultrasound, possible mediastinoscopy on Monday, 04/24/2019.  Melrose Nakayama, MD [facility] [phone]

## 2019-04-20 ENCOUNTER — Other Ambulatory Visit (HOSPITAL_COMMUNITY): Payer: Medicare Other

## 2019-04-24 ENCOUNTER — Ambulatory Visit: Admit: 2019-04-24 | Payer: Medicare Other | Admitting: Thoracic Surgery (Cardiothoracic Vascular Surgery)

## 2019-04-24 SURGERY — VIDEO BRONCHOSCOPY WITH ENDOBRONCHIAL NAVIGATION
Anesthesia: General

## 2019-04-25 ENCOUNTER — Ambulatory Visit: Payer: Medicare Other | Admitting: *Deleted

## 2019-05-02 ENCOUNTER — Other Ambulatory Visit: Payer: Self-pay | Admitting: *Deleted

## 2019-05-02 DIAGNOSIS — C8331 Diffuse large B-cell lymphoma, lymph nodes of head, face, and neck: Secondary | ICD-10-CM

## 2019-05-03 ENCOUNTER — Encounter: Payer: Self-pay | Admitting: Hematology & Oncology

## 2019-05-03 ENCOUNTER — Other Ambulatory Visit: Payer: Self-pay

## 2019-05-03 ENCOUNTER — Telehealth: Payer: Self-pay | Admitting: Hematology & Oncology

## 2019-05-03 ENCOUNTER — Other Ambulatory Visit: Payer: Self-pay | Admitting: Family Medicine

## 2019-05-03 ENCOUNTER — Inpatient Hospital Stay (HOSPITAL_BASED_OUTPATIENT_CLINIC_OR_DEPARTMENT_OTHER): Payer: Medicare Other | Admitting: Hematology & Oncology

## 2019-05-03 ENCOUNTER — Inpatient Hospital Stay: Payer: Medicare Other | Attending: Hematology & Oncology

## 2019-05-03 VITALS — BP 119/72 | HR 92 | Temp 98.0°F | Wt 119.0 lb

## 2019-05-03 DIAGNOSIS — R911 Solitary pulmonary nodule: Secondary | ICD-10-CM | POA: Diagnosis not present

## 2019-05-03 DIAGNOSIS — Z79899 Other long term (current) drug therapy: Secondary | ICD-10-CM | POA: Insufficient documentation

## 2019-05-03 DIAGNOSIS — C8331 Diffuse large B-cell lymphoma, lymph nodes of head, face, and neck: Secondary | ICD-10-CM

## 2019-05-03 DIAGNOSIS — Z8572 Personal history of non-Hodgkin lymphomas: Secondary | ICD-10-CM | POA: Insufficient documentation

## 2019-05-03 LAB — CMP (CANCER CENTER ONLY)
ALT: 25 U/L (ref 0–44)
AST: 26 U/L (ref 15–41)
Albumin: 3.6 g/dL (ref 3.5–5.0)
Alkaline Phosphatase: 129 U/L — ABNORMAL HIGH (ref 38–126)
Anion gap: 8 (ref 5–15)
BUN: 26 mg/dL — ABNORMAL HIGH (ref 8–23)
CO2: 31 mmol/L (ref 22–32)
Calcium: 11.1 mg/dL — ABNORMAL HIGH (ref 8.9–10.3)
Chloride: 101 mmol/L (ref 98–111)
Creatinine: 1.57 mg/dL — ABNORMAL HIGH (ref 0.44–1.00)
GFR, Est AFR Am: 35 mL/min — ABNORMAL LOW (ref >60)
GFR, Estimated: 30 mL/min — ABNORMAL LOW (ref >60)
Glucose, Bld: 165 mg/dL — ABNORMAL HIGH (ref 70–99)
Potassium: 5.5 mmol/L — ABNORMAL HIGH (ref 3.5–5.1)
Sodium: 140 mmol/L (ref 135–145)
Total Bilirubin: 0.8 mg/dL (ref 0.3–1.2)
Total Protein: 9.1 g/dL — ABNORMAL HIGH (ref 6.5–8.1)

## 2019-05-03 LAB — CBC WITH DIFFERENTIAL (CANCER CENTER ONLY)
Abs Immature Granulocytes: 0 10*3/uL (ref 0.00–0.07)
Basophils Absolute: 0 10*3/uL (ref 0.0–0.1)
Basophils Relative: 1 %
Eosinophils Absolute: 0.1 10*3/uL (ref 0.0–0.5)
Eosinophils Relative: 2 %
HCT: 40.9 % (ref 36.0–46.0)
Hemoglobin: 12.7 g/dL (ref 12.0–15.0)
Immature Granulocytes: 0 %
Lymphocytes Relative: 41 %
Lymphs Abs: 1.4 10*3/uL (ref 0.7–4.0)
MCH: 28.4 pg (ref 26.0–34.0)
MCHC: 31.1 g/dL (ref 30.0–36.0)
MCV: 91.5 fL (ref 80.0–100.0)
Monocytes Absolute: 0.4 10*3/uL (ref 0.1–1.0)
Monocytes Relative: 11 %
Neutro Abs: 1.5 10*3/uL — ABNORMAL LOW (ref 1.7–7.7)
Neutrophils Relative %: 45 %
Platelet Count: 172 10*3/uL (ref 150–400)
RBC: 4.47 MIL/uL (ref 3.87–5.11)
RDW: 14.2 % (ref 11.5–15.5)
WBC Count: 3.3 10*3/uL — ABNORMAL LOW (ref 4.0–10.5)
nRBC: 0 % (ref 0.0–0.2)

## 2019-05-03 NOTE — Progress Notes (Signed)
Hematology and Oncology Follow Up Visit  Crystal [name] [id number] [birth year] 85 y.o. 05/03/2019   Principle Diagnosis:  Diffuse large cell non-Hodgkin's lymphoma-remission LEFT Lung mass  Current Therapy:   Observation   Interim History:  Crystal [name] is here today for follow-up.  Unfortunately, she has not had her surgery.  She is post to have a mediastinoscopy by Dr. Roxan Hockey.  She put this off because she did not feel right about having this done.  She says that God will let her know when to have surgery.  I prayed with her and I spoke with her.  I told her that she must have the surgery so we will know what is going on.  I told her that if she put surgery off until July that would still be okay.  However, there is absolutely no way that we can do anything further unless we know what is going on.  She seems to understand this and is agreeable to this.  She is not having any cough.  There is no chest pain.  There is no fever.  She is had no rashes.  Overall, I would say her performance status is ECOG 1-2.    Medications:  Allergies as of 05/03/2019      Reactions   Lisinopril Swelling   Facial and lip swelling   Iohexol Itching         Medication List       Accurate as of May 03, 2019 12:18 PM. If you have any questions, ask your nurse or doctor.        AeroChamber MV inhaler Use as instructed   albuterol 108 (90 Base) MCG/ACT inhaler Commonly known as: VENTOLIN HFA Inhale 1-2 puffs into the lungs every 4 (four) hours as needed for wheezing or shortness of breath.   ALPRAZolam 0.25 MG tablet Commonly known as: XANAX Take 1 tablet (0.25 mg total) by mouth 2 (two) times daily as needed for anxiety.   benzonatate 100 MG capsule Commonly known as: TESSALON Take 1-2 capsules (100-200 mg total) by mouth 3 (three) times daily as needed for cough.   carvedilol 3.125 MG tablet Commonly known as: COREG Take 1 tablet (3.125 mg total) by mouth 2 (two) times daily.    CENTRUM PO Take 1 tablet by mouth daily.   Fish Oil 1000 MG Caps Take 1,000 mg by mouth daily.   furosemide 40 MG tablet Commonly known as: LASIX Take 1.5 tablets (60 mg total) by mouth daily. May addt'l 20mg  (1/2 tab) as needed for addt'l swelling in LE What changed: how much to take   levothyroxine 25 MCG tablet Commonly known as: SYNTHROID Take 1 tablet (25 mcg total) by mouth daily before breakfast.   magnesium hydroxide 400 MG/5ML suspension Commonly known as: MILK OF MAGNESIA Take 15 mLs by mouth daily as needed for mild constipation.   PROBIOTIC DAILY PO Take 1 capsule by mouth daily.       Allergies:  Allergies  Allergen Reactions  . Lisinopril Swelling    Facial and lip swelling  . Iohexol Itching         Past Medical History, Surgical history, Social history, and Family History were reviewed and updated.  Review of Systems: Review of Systems  Constitutional: Positive for malaise/fatigue.  HENT: Negative.   Eyes: Negative.   Respiratory: Positive for shortness of breath and wheezing.   Cardiovascular: Positive for chest pain.  Gastrointestinal: Negative.   Genitourinary: Negative.   Musculoskeletal: Positive for myalgias.  Skin: Negative.   Neurological: Negative.   Endo/Heme/Allergies: Negative.   Psychiatric/Behavioral: Negative.       Physical Exam:  weight is 119 lb (54 kg). Her oral temperature is 98 F (36.7 C). Her blood pressure is 119/72 and her pulse is 92. Her oxygen saturation is 98%.   Wt Readings from Last 3 Encounters:  05/03/19 119 lb (54 kg)  04/18/19 118 lb 9.6 oz (53.8 kg)  03/30/19 120 lb (54.4 kg)    Physical Exam Vitals signs reviewed.  HENT:     Head: Normocephalic and atraumatic.  Eyes:     Pupils: Pupils are equal, round, and reactive to light.  Neck:     Musculoskeletal: Normal range of motion.  Cardiovascular:     Rate and Rhythm: Normal rate and regular rhythm.     Heart sounds: Normal heart sounds.      Comments: Card exam is regular rate and rhythm.  There may be some increase in rate.  There is no murmurs, rubs or bruits. Pulmonary:     Effort: Pulmonary effort is normal.     Breath sounds: Normal breath sounds.     Comments: Lung exam shows decent breath sounds bilaterally.  There is occasional wheezing.  There might be some slight decrease of breath sounds over on the left lung. Abdominal:     General: Bowel sounds are normal.     Palpations: Abdomen is soft.  Musculoskeletal: Normal range of motion.        General: No tenderness or deformity.  Lymphadenopathy:     Cervical: No cervical adenopathy.  Skin:    General: Skin is warm and dry.     Findings: No erythema or rash.  Neurological:     Mental Status: She is alert and oriented to person, place, and time.  Psychiatric:        Behavior: Behavior normal.        Thought Content: Thought content normal.        Judgment: Judgment normal.      Lab Results  Component Value Date   WBC 3.3 (L) 05/03/2019   HGB 12.7 05/03/2019   HCT 40.9 05/03/2019   MCV 91.5 05/03/2019   PLT 172 05/03/2019   No results found for: FERRITIN, IRON, TIBC, UIBC, IRONPCTSAT Lab Results  Component Value Date   RETICCTPCT 1.1 10/15/2011   RBC 4.47 05/03/2019   RETICCTABS 49.7 10/15/2011   No results found for: KPAFRELGTCHN, LAMBDASER, KAPLAMBRATIO No results found for: IGGSERUM, IGA, IGMSERUM No results found for: Odetta Pink, SPEI   Chemistry      Component Value Date/Time   NA 140 05/03/2019 1129   NA 136 03/28/2019 1004   NA 138 01/21/2017 1021   K 5.5 (H) 05/03/2019 1129   K 5.1 01/21/2017 1021   CL 101 05/03/2019 1129   CO2 31 05/03/2019 1129   CO2 30 (H) 01/21/2017 1021   BUN 26 (H) 05/03/2019 1129   BUN 24 03/28/2019 1004   BUN 30.0 (H) 01/21/2017 1021   CREATININE 1.57 (H) 05/03/2019 1129   CREATININE 1.8 (H) 01/21/2017 1021      Component Value Date/Time   CALCIUM  11.1 (H) 05/03/2019 1129   CALCIUM 11.1 (H) 01/21/2017 1021   ALKPHOS 129 (H) 05/03/2019 1129   ALKPHOS 124 01/21/2017 1021   AST 26 05/03/2019 1129   AST 27 01/21/2017 1021   ALT 25 05/03/2019 1129   ALT 25 01/21/2017 1021  BILITOT 0.8 05/03/2019 1129   BILITOT 0.50 01/21/2017 1021       Impression and Plan: Crystal [name] is a very pleasant 85 yo African American female with history of diffuse large cell non-Hodgkin lymphoma diagnosed and treated over 18 years ago. So far there has been no evidence of recurrence.   We still do not have any idea as to what is going on with her lungs.  Again, the mediastinoscopy is a great idea and I really wish that she would have had a by now but with Crystal [name], she really is particular about when she has anything done.  She has to pray about it and she has to hear from God as to when she will have it.  I told her myself that I really believe that July would be a good time for her to have this procedure.  Dr. Roxan Hockey is very skilled and very compassionate and he will do a very good job and we will know for sure if there is any cancer.  I spent about 40 minutes with her today.  I talked to her at length.  I explained why she needed to have this test done.  She seems understand.  I will plan to get her back in 4 weeks or so.  Hopefully, she will have had the mediastinoscopy.    Volanda Napoleon, MD 6/17/202012:18 PM

## 2019-05-03 NOTE — Telephone Encounter (Signed)
Called and LMVM for patient w/ date/time of follow up appointments per 6/17 los

## 2019-05-05 ENCOUNTER — Encounter: Payer: Self-pay | Admitting: *Deleted

## 2019-05-05 ENCOUNTER — Other Ambulatory Visit: Payer: Self-pay | Admitting: *Deleted

## 2019-05-05 DIAGNOSIS — R591 Generalized enlarged lymph nodes: Secondary | ICD-10-CM

## 2019-05-05 DIAGNOSIS — R599 Enlarged lymph nodes, unspecified: Secondary | ICD-10-CM

## 2019-05-08 ENCOUNTER — Other Ambulatory Visit: Payer: Self-pay | Admitting: Family Medicine

## 2019-05-09 ENCOUNTER — Ambulatory Visit: Payer: Medicare Other | Admitting: Emergency Medicine

## 2019-05-13 ENCOUNTER — Other Ambulatory Visit: Payer: Self-pay | Admitting: Family Medicine

## 2019-05-15 NOTE — Telephone Encounter (Signed)
Requesting:xanax Contract:n/a UDS:n/a Last OV:03/21/19 Next OV:05/29/19 Last Refill:01/24/19  #[id number] Database:   Please advise

## 2019-05-18 NOTE — Pre-Procedure Instructions (Signed)
Crystal [name]  05/18/2019      [address], [place] [address] Phone: [phone] Fax: [phone]    Your procedure is scheduled on 05/24/19.  Report to [facility] Admitting at 630 A.M.  Call this number if you have problems the morning of surgery:  [phone]   Remember:  Do not eat or drink after midnight.   Take these medicines the morning of surgery with A SIP OF WATER ---ALL INHALES,XANAX,CARVEDILOL,SYNTHROID    Do not wear jewelry, make-up or nail polish.  Do not wear lotions, powders, or perfumes, or deodorant.  Do not shave 48 hours prior to surgery.  Men may shave face and neck.  Do not bring valuables to the hospital.  [facility] is not responsible for any belongings or valuables.  Contacts, dentures or bridgework may not be worn into surgery.  Leave your suitcase in the car.  After surgery it may be brought to your room.  For patients admitted to the hospital, discharge time will be determined by your treatment team.  Patients discharged the day of surgery will not be allowed to drive home.   NSpecial instructions:  Do not take any aspirin,anti-inflammatories,vitamins,or herbal supplements 5-7 days prior to surgery.[facility] - Preparing for Surgery  Before surgery, you can play an important role.  Because skin is not sterile, your skin needs to be as free of germs as possible.  You can reduce the number of germs on you skin by washing with CHG (chlorahexidine gluconate) soap before surgery.  CHG is an antiseptic cleaner which kills germs and bonds with the skin to continue killing germs even after washing.  Oral Hygiene is also important in reducing the risk of infection.  Remember to brush your teeth with your regular toothpaste the morning of surgery.  Please DO NOT use if you have an allergy to CHG or antibacterial soaps.  If your skin becomes reddened/irritated stop  using the CHG and inform your nurse when you arrive at Short Stay.  Do not shave (including legs and underarms) for at least 48 hours prior to the first CHG shower.  You may shave your face.  Please follow these instructions carefully:   1.  Shower with CHG Soap the night before surgery and the morning of Surgery.  2.  If you choose to wash your hair, wash your hair first as usual with your normal shampoo.  3.  After you shampoo, rinse your hair and body thoroughly to remove the shampoo. 4.  Use CHG as you would any other liquid soap.  You can apply chg directly to the skin and wash gently with a      scrungie or washcloth.           5.  Apply the CHG Soap to your body ONLY FROM THE NECK DOWN.   Do not use on open wounds or open sores. Avoid contact with your eyes, ears, mouth and genitals (private parts).  Wash genitals (private parts) with your normal soap.  6.  Wash thoroughly, paying special attention to the area where your surgery will be performed.  7.  Thoroughly rinse your body with warm water from the neck down.  8.  DO NOT shower/wash with your normal soap after using and rinsing off the CHG Soap.  9.  Pat yourself dry with a clean towel.  10.  Wear clean pajamas.            11.  Place clean sheets on your bed the night of your first shower and do not sleep with pets.  Day of Surgery  Do not apply any lotions/deoderants the morning of surgery.   Please wear clean clothes to the hospital/surgery center. Remember to brush your teeth with toothpaste.    Please read over the following fact sheets that you were given. MRSA Information

## 2019-05-22 ENCOUNTER — Encounter (HOSPITAL_COMMUNITY)
Admission: RE | Admit: 2019-05-22 | Discharge: 2019-05-22 | Disposition: A | Discharge status: Home / Self Care | Payer: Medicare Other | Source: Ambulatory Visit | Attending: Thoracic Surgery (Cardiothoracic Vascular Surgery) | Admitting: Thoracic Surgery (Cardiothoracic Vascular Surgery)

## 2019-05-22 ENCOUNTER — Encounter (HOSPITAL_COMMUNITY): Payer: Self-pay

## 2019-05-22 ENCOUNTER — Other Ambulatory Visit: Payer: Self-pay

## 2019-05-22 ENCOUNTER — Ambulatory Visit (HOSPITAL_COMMUNITY)
Admission: RE | Admit: 2019-05-22 | Discharge: 2019-05-22 | Disposition: A | Discharge status: Home / Self Care | Payer: Medicare Other | Source: Ambulatory Visit | Attending: Thoracic Surgery (Cardiothoracic Vascular Surgery) | Admitting: Thoracic Surgery (Cardiothoracic Vascular Surgery)

## 2019-05-22 ENCOUNTER — Other Ambulatory Visit (HOSPITAL_COMMUNITY)
Admission: RE | Admit: 2019-05-22 | Discharge: 2019-05-22 | Disposition: A | Discharge status: Home / Self Care | Payer: Medicare Other | Source: Ambulatory Visit | Attending: Thoracic Surgery (Cardiothoracic Vascular Surgery) | Admitting: Thoracic Surgery (Cardiothoracic Vascular Surgery)

## 2019-05-22 DIAGNOSIS — J90 Pleural effusion, not elsewhere classified: Secondary | ICD-10-CM | POA: Insufficient documentation

## 2019-05-22 DIAGNOSIS — Z1159 Encounter for screening for other viral diseases: Secondary | ICD-10-CM | POA: Diagnosis not present

## 2019-05-22 DIAGNOSIS — J9811 Atelectasis: Secondary | ICD-10-CM | POA: Diagnosis not present

## 2019-05-22 DIAGNOSIS — R9431 Abnormal electrocardiogram [ECG] [EKG]: Secondary | ICD-10-CM | POA: Insufficient documentation

## 2019-05-22 DIAGNOSIS — R591 Generalized enlarged lymph nodes: Secondary | ICD-10-CM | POA: Diagnosis not present

## 2019-05-22 DIAGNOSIS — Z01818 Encounter for other preprocedural examination: Secondary | ICD-10-CM | POA: Diagnosis not present

## 2019-05-22 DIAGNOSIS — I4510 Unspecified right bundle-branch block: Secondary | ICD-10-CM | POA: Diagnosis not present

## 2019-05-22 DIAGNOSIS — I491 Atrial premature depolarization: Secondary | ICD-10-CM | POA: Diagnosis not present

## 2019-05-22 DIAGNOSIS — R599 Enlarged lymph nodes, unspecified: Secondary | ICD-10-CM

## 2019-05-22 HISTORY — DX: Dyspnea, unspecified: R06.00

## 2019-05-22 HISTORY — DX: Cough, unspecified: R05.9

## 2019-05-22 HISTORY — DX: Localized edema: R60.0

## 2019-05-22 HISTORY — DX: Pleural effusion, not elsewhere classified: J90

## 2019-05-22 LAB — COMPREHENSIVE METABOLIC PANEL
ALT: 26 U/L (ref 0–44)
AST: 34 U/L (ref 15–41)
Albumin: 3.3 g/dL — ABNORMAL LOW (ref 3.5–5.0)
Alkaline Phosphatase: 94 U/L (ref 38–126)
Anion gap: 11 (ref 5–15)
BUN: 29 mg/dL — ABNORMAL HIGH (ref 8–23)
CO2: 27 mmol/L (ref 22–32)
Calcium: 10.1 mg/dL (ref 8.9–10.3)
Chloride: 98 mmol/L (ref 98–111)
Creatinine, Ser: 1.8 mg/dL — ABNORMAL HIGH (ref 0.44–1.00)
GFR calc Af Amer: 30 mL/min — ABNORMAL LOW (ref >60)
GFR calc non Af Amer: 26 mL/min — ABNORMAL LOW (ref >60)
Glucose, Bld: 141 mg/dL — ABNORMAL HIGH (ref 70–99)
Potassium: 3.7 mmol/L (ref 3.5–5.1)
Sodium: 136 mmol/L (ref 135–145)
Total Bilirubin: 1.2 mg/dL (ref 0.3–1.2)
Total Protein: 8.9 g/dL — ABNORMAL HIGH (ref 6.5–8.1)

## 2019-05-22 LAB — CBC
HCT: 39.4 % (ref 36.0–46.0)
Hemoglobin: 12.4 g/dL (ref 12.0–15.0)
MCH: 29 pg (ref 26.0–34.0)
MCHC: 31.5 g/dL (ref 30.0–36.0)
MCV: 92.1 fL (ref 80.0–100.0)
Platelets: 156 10*3/uL (ref 150–400)
RBC: 4.28 MIL/uL (ref 3.87–5.11)
RDW: 15.4 % (ref 11.5–15.5)
WBC: 3.4 10*3/uL — ABNORMAL LOW (ref 4.0–10.5)
nRBC: 0 % (ref 0.0–0.2)

## 2019-05-22 LAB — TYPE AND SCREEN
ABO/RH(D): O POS
Antibody Screen: NEGATIVE

## 2019-05-22 LAB — APTT: aPTT: 35 seconds (ref 24–36)

## 2019-05-22 LAB — SURGICAL PCR SCREEN
MRSA, PCR: NEGATIVE
Staphylococcus aureus: NEGATIVE

## 2019-05-22 LAB — PROTIME-INR
INR: 1.3 — ABNORMAL HIGH (ref 0.8–1.2)
Prothrombin Time: 15.5 seconds — ABNORMAL HIGH (ref 11.4–15.2)

## 2019-05-22 LAB — ABO/RH: ABO/RH(D): O POS

## 2019-05-22 NOTE — Progress Notes (Signed)
PCP - [facility] Cardiologist - NA  Chest x-ray - TODAY EKG - TODAY      Fasting Blood Sugar -     Anesthesia review: REVIEW HEART HX...  Patient denies shortness of breath, fever, cough and chest pain at PAT appointment   Patient verbalized understanding of instructions that were given to them at the PAT appointment. Patient was also instructed that they will need to review over the PAT instructions again at home before surgery.

## 2019-05-23 ENCOUNTER — Encounter (HOSPITAL_COMMUNITY): Payer: Self-pay | Admitting: Anesthesiology

## 2019-05-23 LAB — SARS CORONAVIRUS 2 (TAT 6-24 HRS): SARS Coronavirus 2: NEGATIVE

## 2019-05-23 NOTE — Anesthesia Preprocedure Evaluation (Addendum)
Anesthesia Evaluation  Patient identified by MRN, date of birth, ID band Patient awake    Reviewed: Allergy & Precautions, NPO status , Patient's Chart, lab work & pertinent test results  Airway Mallampati: I  TM Distance: >3 FB Neck ROM: Full    Dental  (+) Upper Dentures, Lower Dentures   Pulmonary neg pulmonary ROS,    breath sounds clear to auscultation       Cardiovascular hypertension, Pt. on home beta blockers and Pt. on medications +CHF   Rhythm:Regular Rate:Normal     Neuro/Psych Anxiety Depression negative neurological ROS     GI/Hepatic negative GI ROS, Neg liver ROS,   Endo/Other  Hypothyroidism   Renal/GU      Musculoskeletal negative musculoskeletal ROS (+)   Abdominal Normal abdominal exam  (+)   Peds  Hematology negative hematology ROS (+)   Anesthesia Other Findings   Reproductive/Obstetrics                           EKG: NSR, 1st degree AV block  Anesthesia Physical Anesthesia Plan  ASA: III  Anesthesia Plan: General   Post-op Pain Management:    Induction: Intravenous  PONV Risk Score and Plan: 4 or greater and Ondansetron and Treatment may vary due to age or medical condition  Airway Management Planned: Oral ETT  Additional Equipment: None  Intra-op Plan:   Post-operative Plan: Extubation in OR  Informed Consent: I have reviewed the patients History and Physical, chart, labs and discussed the procedure including the risks, benefits and alternatives for the proposed anesthesia with the patient or authorized representative who has indicated his/her understanding and acceptance.       Plan Discussed with: CRNA  Anesthesia Plan Comments: (Sees Dr. Stanford Breed for HFrEF and nonischemic cardiomyopathy. Recently admitted 2/20 for CHF exacerbation. Last seen by Dr. Stanford Breed 03/30/19, doing much better at that time.  Preop labs notable for creatinine 1.80, which  is slightly above her recent baseline. Likely due in part to increased dose of Lasix due to CHF exacerbation. CKD is followed by her PCP.   TTE 11/30/18:  - Left ventricle: The cavity size was normal. Wall thickness was   normal. Systolic function was moderately reduced. The estimated   ejection fraction was in the range of 35% to 40%. Features are   consistent with a pseudonormal left ventricular filling pattern,   with concomitant abnormal relaxation and increased filling   pressure (grade 2 diastolic dysfunction). - Aortic valve: There was trivial regurgitation. - Left atrium: The atrium was severely dilated. - Right ventricle: The cavity size was moderately decreased.  )      Anesthesia Quick Evaluation

## 2019-05-24 ENCOUNTER — Ambulatory Visit (HOSPITAL_COMMUNITY): Payer: Medicare Other | Admitting: Certified Registered Nurse Anesthetist

## 2019-05-24 ENCOUNTER — Ambulatory Visit (HOSPITAL_COMMUNITY): Payer: Medicare Other

## 2019-05-24 ENCOUNTER — Observation Stay (HOSPITAL_COMMUNITY)
Admission: RE | Admit: 2019-05-24 | Discharge: 2019-05-25 | Disposition: A | Discharge status: Home / Self Care | Payer: Medicare Other | Attending: Thoracic Surgery (Cardiothoracic Vascular Surgery) | Admitting: Thoracic Surgery (Cardiothoracic Vascular Surgery)

## 2019-05-24 ENCOUNTER — Encounter (HOSPITAL_COMMUNITY)
Admission: RE | Disposition: A | Discharge status: Home / Self Care | Payer: Self-pay | Source: Home / Self Care | Attending: Thoracic Surgery (Cardiothoracic Vascular Surgery)

## 2019-05-24 ENCOUNTER — Other Ambulatory Visit: Payer: Self-pay

## 2019-05-24 ENCOUNTER — Ambulatory Visit (HOSPITAL_COMMUNITY): Payer: Medicare Other | Admitting: Physician Assistant

## 2019-05-24 ENCOUNTER — Encounter (HOSPITAL_COMMUNITY): Payer: Self-pay | Admitting: *Deleted

## 2019-05-24 DIAGNOSIS — R0902 Hypoxemia: Secondary | ICD-10-CM | POA: Insufficient documentation

## 2019-05-24 DIAGNOSIS — E782 Mixed hyperlipidemia: Secondary | ICD-10-CM | POA: Diagnosis not present

## 2019-05-24 DIAGNOSIS — I428 Other cardiomyopathies: Secondary | ICD-10-CM | POA: Diagnosis not present

## 2019-05-24 DIAGNOSIS — R0602 Shortness of breath: Secondary | ICD-10-CM

## 2019-05-24 DIAGNOSIS — F419 Anxiety disorder, unspecified: Secondary | ICD-10-CM | POA: Insufficient documentation

## 2019-05-24 DIAGNOSIS — J90 Pleural effusion, not elsewhere classified: Secondary | ICD-10-CM | POA: Diagnosis not present

## 2019-05-24 DIAGNOSIS — I110 Hypertensive heart disease with heart failure: Secondary | ICD-10-CM | POA: Insufficient documentation

## 2019-05-24 DIAGNOSIS — Z79899 Other long term (current) drug therapy: Secondary | ICD-10-CM | POA: Diagnosis not present

## 2019-05-24 DIAGNOSIS — L041 Acute lymphadenitis of trunk: Secondary | ICD-10-CM | POA: Diagnosis not present

## 2019-05-24 DIAGNOSIS — R591 Generalized enlarged lymph nodes: Secondary | ICD-10-CM

## 2019-05-24 DIAGNOSIS — J8410 Pulmonary fibrosis, unspecified: Secondary | ICD-10-CM | POA: Diagnosis not present

## 2019-05-24 DIAGNOSIS — R918 Other nonspecific abnormal finding of lung field: Secondary | ICD-10-CM | POA: Diagnosis not present

## 2019-05-24 DIAGNOSIS — Z8572 Personal history of non-Hodgkin lymphomas: Secondary | ICD-10-CM | POA: Diagnosis not present

## 2019-05-24 DIAGNOSIS — R599 Enlarged lymph nodes, unspecified: Secondary | ICD-10-CM | POA: Diagnosis not present

## 2019-05-24 DIAGNOSIS — E039 Hypothyroidism, unspecified: Secondary | ICD-10-CM | POA: Diagnosis not present

## 2019-05-24 DIAGNOSIS — I5022 Chronic systolic (congestive) heart failure: Secondary | ICD-10-CM | POA: Insufficient documentation

## 2019-05-24 DIAGNOSIS — Z419 Encounter for procedure for purposes other than remedying health state, unspecified: Secondary | ICD-10-CM

## 2019-05-24 DIAGNOSIS — R590 Localized enlarged lymph nodes: Secondary | ICD-10-CM | POA: Diagnosis present

## 2019-05-24 DIAGNOSIS — Z79890 Hormone replacement therapy (postmenopausal): Secondary | ICD-10-CM | POA: Insufficient documentation

## 2019-05-24 DIAGNOSIS — R222 Localized swelling, mass and lump, trunk: Secondary | ICD-10-CM | POA: Diagnosis not present

## 2019-05-24 DIAGNOSIS — J40 Bronchitis, not specified as acute or chronic: Secondary | ICD-10-CM | POA: Diagnosis not present

## 2019-05-24 DIAGNOSIS — I509 Heart failure, unspecified: Secondary | ICD-10-CM

## 2019-05-24 DIAGNOSIS — L040 Acute lymphadenitis of face, head and neck: Secondary | ICD-10-CM | POA: Diagnosis not present

## 2019-05-24 DIAGNOSIS — F418 Other specified anxiety disorders: Secondary | ICD-10-CM | POA: Diagnosis not present

## 2019-05-24 HISTORY — PX: VIDEO BRONCHOSCOPY WITH ENDOBRONCHIAL NAVIGATION: SHX6175

## 2019-05-24 HISTORY — PX: VIDEO BRONCHOSCOPY WITH ENDOBRONCHIAL ULTRASOUND: SHX6177

## 2019-05-24 HISTORY — PX: MEDIASTINOSCOPY: SHX5086

## 2019-05-24 LAB — BLOOD GAS, ARTERIAL
Acid-Base Excess: 0.6 mmol/L (ref 0.0–2.0)
Bicarbonate: 26.4 mmol/L (ref 20.0–28.0)
Drawn by: 345601
O2 Content: 2 L/min
O2 Saturation: 92 %
Patient temperature: 97.4
pCO2 arterial: 54.4 mmHg — ABNORMAL HIGH (ref 32.0–48.0)
pH, Arterial: 7.303 — ABNORMAL LOW (ref 7.350–7.450)
pO2, Arterial: 70.3 mmHg — ABNORMAL LOW (ref 83.0–108.0)

## 2019-05-24 SURGERY — VIDEO BRONCHOSCOPY WITH ENDOBRONCHIAL NAVIGATION
Anesthesia: General

## 2019-05-24 MED ORDER — CENTRUM PO CHEW: 1.0000 | CHEWABLE_TABLET | Freq: Every day | ORAL | Status: DC

## 2019-05-24 MED ORDER — TRAMADOL HCL 50 MG PO TABS: 50.0000 mg | ORAL_TABLET | Freq: Four times a day (QID) | ORAL | Status: DC | PRN

## 2019-05-24 MED ORDER — ALBUTEROL SULFATE (2.5 MG/3ML) 0.083% IN NEBU
INHALATION_SOLUTION | RESPIRATORY_TRACT | Status: AC
  Filled 2019-05-24: qty 3

## 2019-05-24 MED ORDER — FENTANYL CITRATE (PF) 250 MCG/5ML IJ SOLN
INTRAMUSCULAR | Status: AC
  Filled 2019-05-24: qty 5

## 2019-05-24 MED ORDER — PHENYLEPHRINE HCL (PRESSORS) 10 MG/ML IV SOLN
INTRAVENOUS | Status: DC | PRN
  Administered 2019-05-24 (×2): 120 ug via INTRAVENOUS

## 2019-05-24 MED ORDER — DEXAMETHASONE SODIUM PHOSPHATE 10 MG/ML IJ SOLN
INTRAMUSCULAR | Status: DC | PRN
  Administered 2019-05-24: 10 mg via INTRAVENOUS

## 2019-05-24 MED ORDER — ALBUTEROL SULFATE (2.5 MG/3ML) 0.083% IN NEBU: 2.5000 mg | INHALATION_SOLUTION | RESPIRATORY_TRACT | Status: DC | PRN

## 2019-05-24 MED ORDER — LIDOCAINE 2% (20 MG/ML) 5 ML SYRINGE
INTRAMUSCULAR | Status: DC | PRN
  Administered 2019-05-24: 60 mg via INTRAVENOUS

## 2019-05-24 MED ORDER — SODIUM CHLORIDE 0.9 % IV SOLN
INTRAVENOUS | Status: DC
  Administered 2019-05-24: 23:00:00 via INTRAVENOUS

## 2019-05-24 MED ORDER — CEFAZOLIN SODIUM-DEXTROSE 2-4 GM/100ML-% IV SOLN
INTRAVENOUS | Status: AC
  Filled 2019-05-24: qty 100

## 2019-05-24 MED ORDER — ALBUTEROL SULFATE HFA 108 (90 BASE) MCG/ACT IN AERS: 1.0000 | INHALATION_SPRAY | RESPIRATORY_TRACT | Status: DC | PRN

## 2019-05-24 MED ORDER — EPHEDRINE SULFATE 50 MG/ML IJ SOLN
INTRAMUSCULAR | Status: DC | PRN
  Administered 2019-05-24 (×3): 10 mg via INTRAVENOUS

## 2019-05-24 MED ORDER — PROPOFOL 10 MG/ML IV BOLUS
INTRAVENOUS | Status: AC
  Filled 2019-05-24: qty 20

## 2019-05-24 MED ORDER — ALBUTEROL SULFATE (2.5 MG/3ML) 0.083% IN NEBU
INHALATION_SOLUTION | RESPIRATORY_TRACT | Status: AC
  Administered 2019-05-24: 16:00:00 2.5 mg via RESPIRATORY_TRACT
  Filled 2019-05-24: qty 3

## 2019-05-24 MED ORDER — LEVOTHYROXINE SODIUM 25 MCG PO TABS
25.0000 ug | ORAL_TABLET | Freq: Every day | ORAL | Status: DC
  Administered 2019-05-25: 09:00:00 25 ug via ORAL
  Filled 2019-05-24: qty 1

## 2019-05-24 MED ORDER — PROPOFOL 10 MG/ML IV BOLUS
INTRAVENOUS | Status: DC | PRN
  Administered 2019-05-24: 100 mg via INTRAVENOUS

## 2019-05-24 MED ORDER — FUROSEMIDE 40 MG PO TABS
60.0000 mg | ORAL_TABLET | Freq: Every day | ORAL | Status: DC
  Filled 2019-05-24 (×2): qty 1

## 2019-05-24 MED ORDER — ALBUTEROL SULFATE (2.5 MG/3ML) 0.083% IN NEBU
2.5000 mg | INHALATION_SOLUTION | Freq: Four times a day (QID) | RESPIRATORY_TRACT | Status: DC | PRN
  Administered 2019-05-24: 2.5 mg via RESPIRATORY_TRACT

## 2019-05-24 MED ORDER — FOLIC ACID 1 MG PO TABS
0.5000 mg | ORAL_TABLET | Freq: Every day | ORAL | Status: DC
  Administered 2019-05-25: 11:00:00 0.5 mg via ORAL
  Filled 2019-05-24: qty 1

## 2019-05-24 MED ORDER — CHLORHEXIDINE GLUCONATE 0.12 % MT SOLN
15.0000 mL | Freq: Two times a day (BID) | OROMUCOSAL | Status: DC
  Administered 2019-05-24 – 2019-05-25 (×2): 15 mL via OROMUCOSAL

## 2019-05-24 MED ORDER — FUROSEMIDE 10 MG/ML IJ SOLN
INTRAMUSCULAR | Status: AC
  Administered 2019-05-24: 13:00:00 20 mg via INTRAVENOUS
  Filled 2019-05-24: qty 4

## 2019-05-24 MED ORDER — LIDOCAINE 2% (20 MG/ML) 5 ML SYRINGE
INTRAMUSCULAR | Status: AC
  Filled 2019-05-24: qty 5

## 2019-05-24 MED ORDER — ONDANSETRON HCL 4 MG/2ML IJ SOLN
INTRAMUSCULAR | Status: AC
  Filled 2019-05-24: qty 2

## 2019-05-24 MED ORDER — LACTATED RINGERS IV SOLN
INTRAVENOUS | Status: DC | PRN
  Administered 2019-05-24 (×2): via INTRAVENOUS

## 2019-05-24 MED ORDER — ONDANSETRON HCL 4 MG/2ML IJ SOLN
INTRAMUSCULAR | Status: DC | PRN
  Administered 2019-05-24: 4 mg via INTRAVENOUS

## 2019-05-24 MED ORDER — MAGNESIUM HYDROXIDE 400 MG/5ML PO SUSP: 15.0000 mL | Freq: Every day | ORAL | Status: DC | PRN

## 2019-05-24 MED ORDER — FENTANYL CITRATE (PF) 100 MCG/2ML IJ SOLN
INTRAMUSCULAR | Status: DC | PRN
  Administered 2019-05-24: 50 ug via INTRAVENOUS
  Administered 2019-05-24: 100 ug via INTRAVENOUS

## 2019-05-24 MED ORDER — ALPRAZOLAM 0.25 MG PO TABS: 0.2500 mg | ORAL_TABLET | Freq: Two times a day (BID) | ORAL | Status: DC | PRN

## 2019-05-24 MED ORDER — FUROSEMIDE 10 MG/ML IJ SOLN
40.0000 mg | Freq: Once | INTRAMUSCULAR | Status: AC
  Administered 2019-05-24: 20:00:00 40 mg via INTRAVENOUS

## 2019-05-24 MED ORDER — NALOXONE HCL 0.4 MG/ML IJ SOLN
0.0400 mg | INTRAMUSCULAR | Status: DC | PRN
  Administered 2019-05-24: 13:00:00 0.04 mg via INTRAVENOUS

## 2019-05-24 MED ORDER — ALBUTEROL SULFATE (2.5 MG/3ML) 0.083% IN NEBU
2.5000 mg | INHALATION_SOLUTION | Freq: Once | RESPIRATORY_TRACT | Status: AC
  Administered 2019-05-24: 16:00:00 2.5 mg via RESPIRATORY_TRACT

## 2019-05-24 MED ORDER — CARVEDILOL 3.125 MG PO TABS
3.1250 mg | ORAL_TABLET | Freq: Two times a day (BID) | ORAL | Status: DC
  Administered 2019-05-25: 3.125 mg via ORAL
  Filled 2019-05-24: qty 1

## 2019-05-24 MED ORDER — FENTANYL CITRATE (PF) 100 MCG/2ML IJ SOLN
12.5000 ug | INTRAMUSCULAR | Status: DC | PRN
  Administered 2019-05-24: 12.5 ug via INTRAVENOUS
  Filled 2019-05-24: qty 2

## 2019-05-24 MED ORDER — ACETAMINOPHEN 325 MG PO TABS: 650.0000 mg | ORAL_TABLET | Freq: Four times a day (QID) | ORAL | Status: DC | PRN

## 2019-05-24 MED ORDER — FUROSEMIDE 10 MG/ML IJ SOLN
40.0000 mg | Freq: Once | INTRAMUSCULAR | Status: AC
  Administered 2019-05-24: 23:00:00 40 mg via INTRAVENOUS
  Filled 2019-05-24: qty 4

## 2019-05-24 MED ORDER — SUGAMMADEX SODIUM 200 MG/2ML IV SOLN
INTRAVENOUS | Status: DC | PRN
  Administered 2019-05-24 (×2): 200 mg via INTRAVENOUS

## 2019-05-24 MED ORDER — NALOXONE HCL 0.4 MG/ML IJ SOLN
INTRAMUSCULAR | Status: AC
  Administered 2019-05-24: 13:00:00 0.04 mg via INTRAVENOUS
  Filled 2019-05-24: qty 1

## 2019-05-24 MED ORDER — ROCURONIUM BROMIDE 50 MG/5ML IV SOSY
PREFILLED_SYRINGE | INTRAVENOUS | Status: DC | PRN
  Administered 2019-05-24: 10 mg via INTRAVENOUS
  Administered 2019-05-24: 40 mg via INTRAVENOUS
  Administered 2019-05-24 (×2): 20 mg via INTRAVENOUS

## 2019-05-24 MED ORDER — ORAL CARE MOUTH RINSE: 15.0000 mL | Freq: Two times a day (BID) | OROMUCOSAL | Status: DC

## 2019-05-24 MED ORDER — HEMOSTATIC AGENTS (NO CHARGE) OPTIME
TOPICAL | Status: DC | PRN
  Administered 2019-05-24: 1 via TOPICAL

## 2019-05-24 MED ORDER — PHENYLEPHRINE 40 MCG/ML (10ML) SYRINGE FOR IV PUSH (FOR BLOOD PRESSURE SUPPORT)
PREFILLED_SYRINGE | INTRAVENOUS | Status: AC
  Filled 2019-05-24: qty 10

## 2019-05-24 MED ORDER — CEFAZOLIN SODIUM-DEXTROSE 2-4 GM/100ML-% IV SOLN
2.0000 g | INTRAVENOUS | Status: AC
  Administered 2019-05-24: 2 g via INTRAVENOUS

## 2019-05-24 MED ORDER — BENZONATATE 100 MG PO CAPS: 100.0000 mg | ORAL_CAPSULE | Freq: Two times a day (BID) | ORAL | Status: DC | PRN

## 2019-05-24 MED ORDER — CHLORHEXIDINE GLUCONATE CLOTH 2 % EX PADS: 6.0000 | MEDICATED_PAD | Freq: Every day | CUTANEOUS | Status: DC

## 2019-05-24 MED ORDER — EPHEDRINE 5 MG/ML INJ
INTRAVENOUS | Status: AC
  Filled 2019-05-24: qty 10

## 2019-05-24 MED ORDER — SODIUM CHLORIDE 0.9 % IV SOLN
INTRAVENOUS | Status: DC | PRN
  Administered 2019-05-24: 50 ug/min via INTRAVENOUS

## 2019-05-24 MED ORDER — ADULT MULTIVITAMIN W/MINERALS CH
1.0000 | ORAL_TABLET | Freq: Every day | ORAL | Status: DC
  Administered 2019-05-25: 1 via ORAL
  Filled 2019-05-24: qty 1

## 2019-05-24 MED ORDER — TRAMADOL HCL 50 MG PO TABS: 50.0000 mg | ORAL_TABLET | Freq: Four times a day (QID) | ORAL | 0 refills | Status: DC | PRN

## 2019-05-24 MED ORDER — 0.9 % SODIUM CHLORIDE (POUR BTL) OPTIME
TOPICAL | Status: DC | PRN
  Administered 2019-05-24: 09:00:00 1000 mL

## 2019-05-24 MED ORDER — FUROSEMIDE 10 MG/ML IJ SOLN
20.0000 mg | Freq: Once | INTRAMUSCULAR | Status: AC
  Administered 2019-05-24: 13:00:00 20 mg via INTRAVENOUS

## 2019-05-24 MED ORDER — DEXAMETHASONE SODIUM PHOSPHATE 10 MG/ML IJ SOLN
INTRAMUSCULAR | Status: AC
  Filled 2019-05-24: qty 1

## 2019-05-24 MED ORDER — FUROSEMIDE 10 MG/ML IJ SOLN
INTRAMUSCULAR | Status: AC
  Administered 2019-05-24: 20:00:00 40 mg via INTRAVENOUS
  Filled 2019-05-24: qty 4

## 2019-05-24 MED ORDER — EPINEPHRINE PF 1 MG/ML IJ SOLN
INTRAMUSCULAR | Status: DC | PRN
  Administered 2019-05-24: 1 mg via ENDOTRACHEOPULMONARY

## 2019-05-24 MED ORDER — FENTANYL CITRATE (PF) 100 MCG/2ML IJ SOLN: 25.0000 ug | INTRAMUSCULAR | Status: DC | PRN

## 2019-05-24 SURGICAL SUPPLY — 86 items
ADAPTER BRONCHOSCOPE OLYMPUS (ADAPTER) ×3 IMPLANT
ADAPTER VALVE BIOPSY EBUS (MISCELLANEOUS) IMPLANT
ADH SKN CLS APL DERMABOND .7 (GAUZE/BANDAGES/DRESSINGS) ×2
ADPR BSCP OLMPS EDG (ADAPTER) ×1
ADPTR VALVE BIOPSY EBUS (MISCELLANEOUS)
APPLIER CLIP LOGIC TI 5 (MISCELLANEOUS) IMPLANT
APR CLP MED LRG 33X5 (MISCELLANEOUS)
BLADE SURG 15 STRL LF DISP TIS (BLADE) ×1 IMPLANT
BLADE SURG 15 STRL SS (BLADE)
BRUSH BIOPSY BRONCH 10 SDTNB (MISCELLANEOUS) IMPLANT
BRUSH BIOPSY BRONCH 10MM SDTNB (MISCELLANEOUS)
BRUSH CYTOL CELLEBRITY 1.5X140 (MISCELLANEOUS) IMPLANT
BRUSH SUPERTRAX BIOPSY (INSTRUMENTS) IMPLANT
BRUSH SUPERTRAX NDL-TIP CYTO (INSTRUMENTS) ×3 IMPLANT
CANISTER SUCT 3000ML PPV (MISCELLANEOUS) ×4 IMPLANT
CHANNEL WORK EXTEND EDGE 180 (KITS) ×2 IMPLANT
CHANNEL WORK EXTEND EDGE 90 (KITS) IMPLANT
CLIP VESOCCLUDE MED 6/CT (CLIP) ×3 IMPLANT
CONT SPEC 4OZ CLIKSEAL STRL BL (MISCELLANEOUS) ×9 IMPLANT
COUNTER NEEDLE 20 DBL MAG RED (NEEDLE) ×2 IMPLANT
COVER BACK TABLE 60X90IN (DRAPES) ×4 IMPLANT
COVER SURGICAL LIGHT HANDLE (MISCELLANEOUS) ×4 IMPLANT
COVER WAND RF STERILE (DRAPES) ×1 IMPLANT
DERMABOND ADVANCED (GAUZE/BANDAGES/DRESSINGS) ×4
DERMABOND ADVANCED .7 DNX12 (GAUZE/BANDAGES/DRESSINGS) ×1 IMPLANT
DRAPE CHEST BREAST 15X10 FENES (DRAPES) ×3 IMPLANT
ELECT REM PT RETURN 9FT ADLT (ELECTROSURGICAL) ×3
ELECTRODE REM PT RTRN 9FT ADLT (ELECTROSURGICAL) ×1 IMPLANT
FILTER STRAW FLUID ASPIR (MISCELLANEOUS) IMPLANT
FORCEPS BIOP RJ4 1.8 (CUTTING FORCEPS) IMPLANT
FORCEPS BIOP SUPERTRX PREMAR (INSTRUMENTS) ×2 IMPLANT
FORCEPS RADIAL JAW LRG 4 PULM (INSTRUMENTS) IMPLANT
GAUZE 4X4 16PLY RFD (DISPOSABLE) ×3 IMPLANT
GAUZE SPONGE 4X4 12PLY STRL (GAUZE/BANDAGES/DRESSINGS) ×3 IMPLANT
GLOVE SURG SIGNA 7.5 PF LTX (GLOVE) ×6 IMPLANT
GOWN STRL REUS W/ TWL LRG LVL3 (GOWN DISPOSABLE) ×1 IMPLANT
GOWN STRL REUS W/ TWL XL LVL3 (GOWN DISPOSABLE) ×2 IMPLANT
GOWN STRL REUS W/TWL LRG LVL3 (GOWN DISPOSABLE) ×6
GOWN STRL REUS W/TWL XL LVL3 (GOWN DISPOSABLE) ×12
HEMOSTAT SURGICEL 2X14 (HEMOSTASIS) ×2 IMPLANT
KIT BASIN OR (CUSTOM PROCEDURE TRAY) ×1 IMPLANT
KIT CLEAN ENDO COMPLIANCE (KITS) ×7 IMPLANT
KIT PROCEDURE EDGE 180 (KITS) ×2 IMPLANT
KIT PROCEDURE EDGE 90 (KITS) IMPLANT
KIT TURNOVER KIT B (KITS) ×4 IMPLANT
MARKER SKIN DUAL TIP RULER LAB (MISCELLANEOUS) ×4 IMPLANT
NDL ASPIRATION VIZISHOT 19G (NEEDLE) IMPLANT
NDL ASPIRATION VIZISHOT 21G (NEEDLE) ×1 IMPLANT
NDL BLUNT 18X1 FOR OR ONLY (NEEDLE) IMPLANT
NDL SUPERTRX PREMARK BIOPSY (NEEDLE) IMPLANT
NEEDLE ASPIRATION VIZISHOT 19G (NEEDLE) ×3 IMPLANT
NEEDLE ASPIRATION VIZISHOT 21G (NEEDLE) ×3 IMPLANT
NEEDLE BLUNT 18X1 FOR OR ONLY (NEEDLE) IMPLANT
NEEDLE SUPERTRX PREMARK BIOPSY (NEEDLE) ×3 IMPLANT
NS IRRIG 1000ML POUR BTL (IV SOLUTION) ×4 IMPLANT
OIL SILICONE PENTAX (PARTS (SERVICE/REPAIRS)) ×4 IMPLANT
PACK GENERAL/GYN (CUSTOM PROCEDURE TRAY) ×3 IMPLANT
PAD ARMBOARD 7.5X6 YLW CONV (MISCELLANEOUS) ×8 IMPLANT
PATCHES PATIENT (LABEL) ×9 IMPLANT
RADIAL JAW LRG 4 PULMONARY (INSTRUMENTS)
SPONGE INTESTINAL PEANUT (DISPOSABLE) ×2 IMPLANT
SUT SILK 2 0 (SUTURE)
SUT SILK 2-0 18XBRD TIE 12 (SUTURE) IMPLANT
SUT VIC AB 2-0 CT1 27 (SUTURE) ×3
SUT VIC AB 2-0 CT1 TAPERPNT 27 (SUTURE) IMPLANT
SUT VIC AB 3-0 SH 18 (SUTURE) ×1 IMPLANT
SUT VIC AB 3-0 X1 27 (SUTURE) ×2 IMPLANT
SUT VIC AB 4-0 PS2 27 (SUTURE) ×2 IMPLANT
SUT VICRYL 4-0 PS2 18IN ABS (SUTURE) ×1 IMPLANT
SYR 10ML LL (SYRINGE) ×1 IMPLANT
SYR 20CC LL (SYRINGE) ×6 IMPLANT
SYR 20ML ECCENTRIC (SYRINGE) ×6 IMPLANT
SYR 30ML LL (SYRINGE) ×1 IMPLANT
SYR 3ML LL SCALE MARK (SYRINGE) ×2 IMPLANT
SYR 5ML LL (SYRINGE) ×2 IMPLANT
SYR 5ML LUER SLIP (SYRINGE) ×1 IMPLANT
TOWEL GREEN STERILE (TOWEL DISPOSABLE) ×4 IMPLANT
TOWEL GREEN STERILE FF (TOWEL DISPOSABLE) ×4 IMPLANT
TRAP SPECIMEN MUCOUS 40CC (MISCELLANEOUS) ×4 IMPLANT
TUBE CONNECTING 20'X1/4 (TUBING) ×2
TUBE CONNECTING 20X1/4 (TUBING) ×5 IMPLANT
UNDERPAD 30X30 (UNDERPADS AND DIAPERS) ×1 IMPLANT
VALVE BIOPSY  SINGLE USE (MISCELLANEOUS) ×2
VALVE BIOPSY SINGLE USE (MISCELLANEOUS) ×2 IMPLANT
VALVE SUCTION BRONCHIO DISP (MISCELLANEOUS) ×6 IMPLANT
WATER STERILE IRR 1000ML POUR (IV SOLUTION) ×2 IMPLANT

## 2019-05-24 NOTE — Progress Notes (Signed)
RT transported patient from [facility] to [facility] without any complications

## 2019-05-24 NOTE — Progress Notes (Signed)
Day of Surgery Procedure(s) (LRB): VIDEO BRONCHOSCOPY WITH ENDOBRONCHIAL NAVIGATION (N/A) VIDEO BRONCHOSCOPY WITH ENDOBRONCHIAL ULTRASOUND (N/A) MEDIASTINOSCOPY (N/A) Subjective: Patient tachypneic and struggling to breath upon transfer to [facility] Lungs with coarse breath sounds, wet  respiratory rate > 30/min O2 sat 95%   BP 140/70  PMHx includes CHF, last echo  EF 35%   Objective: Vital signs in last 24 hours: Temp:  [96.6 F (35.9 C)-97.8 F (36.6 C)] 96.6 F (35.9 C) (07/08 1846) Pulse Rate:  [72-82] 80 (07/08 1655) Cardiac Rhythm: Normal sinus rhythm;Heart block (07/08 1903) Resp:  [18-38] 38 (07/08 1846) BP: (99-131)/(47-93) 100/87 (07/08 1846) SpO2:  [81 %-100 %] 100 % (07/08 1846) Weight:  [54.4 kg] 54.4 kg (07/08 0624)  Hemodynamic parameters for last 24 hours:    Intake/Output from previous day: No intake/output data recorded. Intake/Output this shift: No intake/output data recorded.    Lab Results: Recent Labs    05/22/19 1059  WBC 3.4*  HGB 12.4  HCT 39.4  PLT 156   BMET:  Recent Labs    05/22/19 1059  NA 136  K 3.7  CL 98  CO2 27  GLUCOSE 141*  BUN 29*  CREATININE 1.80*  CALCIUM 10.1    PT/INR:  Recent Labs    05/22/19 1059  LABPROT 15.5*  INR 1.3*   ABG    Component Value Date/Time   PHART 7.392 01/15/2019 0410   HCO3 25.9 01/15/2019 0410   TCO2 25 01/12/2019 1759   ACIDBASEDEF 2.0 01/12/2019 1759   O2SAT 99.4 01/15/2019 0410   CBG (last 3)  No results for input(s): GLUCAP in the last 72 hours.  Assessment/Plan: S/P Procedure(s) (LRB): VIDEO BRONCHOSCOPY WITH ENDOBRONCHIAL NAVIGATION (N/A) VIDEO BRONCHOSCOPY WITH ENDOBRONCHIAL ULTRASOUND (N/A) MEDIASTINOSCOPY (N/A) Transfer to ICU to monitor and treat respiratory problems   LOS: 0 days    Tharon Aquas Trigt III 05/24/2019

## 2019-05-24 NOTE — Anesthesia Postprocedure Evaluation (Signed)
Anesthesia Post Note  Patient: Crystal [name]  Procedure(s) Performed: VIDEO BRONCHOSCOPY WITH ENDOBRONCHIAL NAVIGATION (N/A ) VIDEO BRONCHOSCOPY WITH ENDOBRONCHIAL ULTRASOUND (N/A ) MEDIASTINOSCOPY (N/A )     Patient location during evaluation: PACU Anesthesia Type: General Level of consciousness: awake and alert Pain management: pain level controlled Vital Signs Assessment: post-procedure vital signs reviewed and stable Respiratory status: spontaneous breathing, nonlabored ventilation, respiratory function stable and patient connected to nasal cannula oxygen Cardiovascular status: blood pressure returned to baseline and stable Postop Assessment: no apparent nausea or vomiting Anesthetic complications: no Comments: Breathing tx x2, 20mg  IV Lasix x1, IS in PACU. Responding well to treatment but will require close monitoring overnight. I recommend 23 hours observation in hospital or daughter to stay with her tonight if decision is made to discharge.      Last Vitals:  Vitals:   05/24/19 1655 05/24/19 1710  BP: 123/88 125/90  Pulse: 80   Resp: (!) 25 (!) 27  Temp:    SpO2: 97%     Last Pain:  Vitals:   05/24/19 1710  TempSrc:   PainSc: 0-No pain                 Effie Berkshire

## 2019-05-24 NOTE — Progress Notes (Signed)
Report given to night shift RN, pt clearly having more difficulty breathing.  Respirations in 30s, SpO2 96% on 2L O2.  Pt unable to swallow ordered po lasix due to dyspnea, lung sounds are audible at bedside.  Also of note, pt feels cool to touch, axillary temp 96.1.  On call MD paged and notified, Dr. Prescott Gum to put in transfer orders to [facility] and orders for IV lasix.  Night shift RN at bedside, pt family notified.

## 2019-05-24 NOTE — Discharge Instructions (Addendum)
Do not drive or engage in heavy physical activity for 48 hours  You may shower tomorrow. Do not submerge the incision in a tub or swimming pool for 3 weeks  There is a medical adhesive over the incision- it will begin to peel off in about 2 weeks  You may cough up small amounts of blood over the next several days.   You have a prescription for tramadol, a mild narcotic pain reliever, you may use as directed.  You may use acetaminophen (Tylenol) in addition to, or instead of, the tramadol  Call [phone] if you develop chest pain, shortness of breath, fever > 101 F, cough up more than 2 tablespoons of blood or notice excessive pain, swelling, redness or drainage at the incision  My office will contact you with follow up information

## 2019-05-24 NOTE — Transfer of Care (Signed)
Immediate Anesthesia Transfer of Care Note  Patient: Crystal [name]  Procedure(s) Performed: VIDEO BRONCHOSCOPY WITH ENDOBRONCHIAL NAVIGATION (N/A ) VIDEO BRONCHOSCOPY WITH ENDOBRONCHIAL ULTRASOUND (N/A ) MEDIASTINOSCOPY (N/A )  Patient Location: PACU  Anesthesia Type:General  Level of Consciousness: awake and drowsy  Airway & Oxygen Therapy: Patient Spontanous Breathing and Patient connected to face mask oxygen  Post-op Assessment: Report given to RN and Post -op Vital signs reviewed and stable  Post vital signs: Reviewed and stable  Last Vitals:  Vitals Value Taken Time  BP 113/74 05/24/19 1240  Temp    Pulse 77 05/24/19 1249  Resp 25 05/24/19 1249  SpO2 82 % 05/24/19 1249  Vitals shown include unvalidated device data.  Last Pain:  Vitals:   05/24/19 0651  TempSrc:   PainSc: 0-No pain         Complications: No apparent anesthesia complications

## 2019-05-24 NOTE — Anesthesia Procedure Notes (Addendum)
Procedure Name: Intubation Date/Time: 05/24/2019 8:41 AM Performed by: Inda Coke, CRNA Pre-anesthesia Checklist: Patient identified, Emergency Drugs available, Suction available and Patient being monitored Patient Re-evaluated:Patient Re-evaluated prior to induction Oxygen Delivery Method: Circle System Utilized Preoxygenation: Pre-oxygenation with 100% oxygen Induction Type: IV induction Ventilation: Mask ventilation without difficulty Laryngoscope Size: Mac and 3 Grade View: Grade I Tube type: Oral Tube size: 8.5 mm Number of attempts: 1 Airway Equipment and Method: Stylet and Oral airway Placement Confirmation: ETT inserted through vocal cords under direct vision,  positive ETCO2 and breath sounds checked- equal and bilateral Secured at: 19 cm Tube secured with: Tape Dental Injury: Teeth and Oropharynx as per pre-operative assessment

## 2019-05-24 NOTE — Progress Notes (Signed)
Pt received from PACU.

## 2019-05-24 NOTE — Interval H&P Note (Signed)
History and Physical Interval Note:  No interval change  05/24/2019 8:12 AM  Crystal [name]  has presented today for surgery, with the diagnosis of LEFT APICAL MASS ADENOPATHY.  The various methods of treatment have been discussed with the patient and family. After consideration of risks, benefits and other options for treatment, the patient has consented to  Procedure(s): VIDEO BRONCHOSCOPY WITH ENDOBRONCHIAL NAVIGATION (N/A) VIDEO BRONCHOSCOPY WITH ENDOBRONCHIAL ULTRASOUND (N/A) possible MEDIASTINOSCOPY (N/A) as a surgical intervention.  The patient's history has been reviewed, patient examined, no change in status, stable for surgery.  I have reviewed the patient's chart and labs.  Questions were answered to the patient's satisfaction.     Melrose Nakayama

## 2019-05-24 NOTE — H&P (Signed)
[address]       Crystal [name],[place] [address]             [phone]                HPI: Mrs. Crystal [name] returns for a scheduled follow-up visit  Crystal [name] is an 85 year old woman with a history of non-Hodgkin's lymphoma, nonischemic cardiomyopathy, chronic systolic left heart failure, hypertension, hypothyroidism, anxiety, depression, and anemia. In July 2018 she presented with neck pain.  A CT of the neck showed a left apical abnormality.  A chest CT showed a 3.9 x 2.9 cm cavitary lesion in the left apex.  She also had bilateral hilar and mediastinal adenopathy and multiple other lung nodules.  Dr. Lamonte Sakai did a bronchoscopy and endobronchial ultrasound.  Samples were nondiagnostic.  In January 2020 she was admitted after a near syncopal episode.  She was in decompensated heart failure.  A CT showed left apical mass and adenopathy were unchanged to slightly smaller.  She had bilateral pleural effusions.  She then was referred for possible mediastinal ostomy.  I saw her in April.  I did fungal antibody and QuantiFERON testing.  That all returned negative.  She was in decompensated heart failure and with the COVID situation we felt it was best to defer biopsy.  She feels much better.  Her shortness of breath is improved significantly.  She does still have some swelling in her legs.  She had a virtual visit with Dr. Stanford Breed couple of weeks ago.      Past Medical History:  Diagnosis Date  . Anemia 08/21/2017  . Anxiety   . Cardiomyopathy    non ischemic NL cos on cath 08/2009, EF to 15-20%, Her follow up  2D echo  in 10/2009 showed impoved EFo 35-40%  . CHF (congestive heart failure) ([place])   . Depression   . Full dentures   . Hypercalcemia 04/20/2017  . Hypertension   . Hypothyroid   . Lung mass   . Medicare annual wellness visit, subsequent 12/14/2014   Follows with Dr Marin Olp Follows with Dr Deatra Ina, gastroenterology, last colonoscopy in 2012, repeat in 2017  Last Pap 2011, always normal, no need for repeat Last MGM in 2011, no concerns, declines for now Follows with Dr Stanford Breed of cardiology   . Non Hodgkin's lymphoma ([place]) 11/17/1999   -Dr. Marin Olp  . Vitamin D deficiency 10/17/2015  . Wears glasses         Past Surgical History:  Procedure Laterality Date  . DILATION AND CURETTAGE OF UTERUS    . MULTIPLE TOOTH EXTRACTIONS    . NASAL POLYP EXCISION  1962  . TONSILLECTOMY  1952  . VIDEO BRONCHOSCOPY WITH ENDOBRONCHIAL NAVIGATION Right 06/29/2018   Procedure: VIDEO BRONCHOSCOPY WITH ENDOBRONCHIAL NAVIGATION;  Surgeon: Collene Gobble, MD;  Location: [facility];  Service: Thoracic;  Laterality: Right;  Marland Kitchen VIDEO BRONCHOSCOPY WITH ENDOBRONCHIAL ULTRASOUND Right 06/29/2018   Procedure: VIDEO BRONCHOSCOPY WITH ENDOBRONCHIAL ULTRASOUND;  Surgeon: Collene Gobble, MD;  Location: [facility];  Service: Thoracic;  Laterality: Right;           Current Outpatient Medications  Medication Sig Dispense Refill  . albuterol (PROVENTIL HFA;VENTOLIN HFA) 108 (90 Base) MCG/ACT inhaler Inhale 1-2 puffs into the lungs every 4 (four) hours as needed for wheezing or shortness of breath. 1 Inhaler 2  . ALPRAZolam (XANAX) 0.25 MG tablet Take 1 tablet (0.25 mg total) by mouth 2 (two) times daily as needed for  anxiety. 30 tablet 1  . benzonatate (TESSALON) 100 MG capsule Take 1-2 capsules (100-200 mg total) by mouth 3 (three) times daily as needed for cough. 60 capsule 1  . carvedilol (COREG) 3.125 MG tablet Take 1 tablet (3.125 mg total) by mouth 2 (two) times daily.    . furosemide (LASIX) 40 MG tablet Take 1.5 tablets (60 mg total) by mouth daily. May addt'l 20mg  (1/2 tab) as needed for addt'l swelling in LE (Patient taking differently: Take 40 mg by mouth daily. May addt'l 20mg  (1/2 tab) as needed for addt'l swelling in LE) 180 tablet 3  . levothyroxine (SYNTHROID) 25 MCG tablet Take 1 tablet (25 mcg total) by mouth daily before breakfast. 90 tablet 0  . magnesium  hydroxide (MILK OF MAGNESIA) 400 MG/5ML suspension Take 15 mLs by mouth daily as needed for mild constipation.    . Multiple Vitamins-Minerals (CENTRUM PO) Take 1 tablet by mouth daily.     . Omega-3 Fatty Acids (FISH OIL) 1000 MG CAPS Take 1,000 mg by mouth daily.    . Probiotic Product (PROBIOTIC DAILY PO) Take 1 capsule by mouth daily.     Marland Kitchen Spacer/Aero-Holding Chambers (AEROCHAMBER MV) inhaler Use as instructed 1 each 0   No current facility-administered medications for this visit.     Physical Exam BP 128/83 (BP Location: Left Arm, Patient Position: Sitting, Cuff Size: Normal)   Pulse 99   Temp 97.7 F (36.5 C) (Skin)   Resp 20   Ht 5\' 3"  (1.6 m)   Wt 118 lb 9.6 oz (53.8 kg)   SpO2 94% Comment: RA  BMI 21.37 kg/m  85 year old woman in no acute distress Alert and oriented x3 with no focal deficits HEENT-PERRLA, wearing surgical mask Neck supple without palpable adenopathy Cardiac regular rate and rhythm, normal S1 and S2, positive S4 Trace edema in feet and ankles  Diagnostic Tests: CT CHEST WITHOUT CONTRAST  TECHNIQUE: Multidetector CT imaging of the chest was performed using thin slice collimation for electromagnetic bronchoscopy planning purposes, without intravenous contrast.  COMPARISON: PET-CT 03/03/2019  FINDINGS: Cardiovascular: Cardiac enlargement. Aortic atherosclerosis. Lad and RCA coronary artery calcifications.  Mediastinum/Nodes: Normal appearance of the thyroid gland. The trachea appears patent and is midline. Normal appearance of the esophagus.  Enlarged and partially calcified mediastinal and hilar lymph nodes are identified. Index subcarinal lymph node measures 2.1 cm, image 56/2. Unchanged from previous exam. The index left hilar lymph node measures 2.7 cm, image 56/2. Unchanged. Index right paratracheal lymph node measures 1.8 cm, image 46/2. Previously 1.6 cm. Index left pre-vascular node measures 1.1 cm, image 43/2.  Previously 1.2 cm.  Lungs/Pleura: Moderate bilateral pleural effusions are identified. The left upper lobe cavitary lung mass is again identified. Bilateral upper lobe predominant interstitial accentuation and ground-glass attenuation is identified which is partially improved from 01/10/2011.  Again seen are scattered subpleural, perifissural and peribronchovascular nodules in both lungs. Perifissural nodule within the right middle lobe and right lower lobe are again noted.  -Index nodule in the right lower lobe measures 1.6 cm, image 60/3. Previously 1.5 cm.  -The index nodule within the right middle lobe measures 1.5 cm, image 59/3. Previously 1.4 cm.  -Subpleural nodule within the anterolateral left lower lobe measures 0.9 cm, image 58/3. Unchanged from previous exam.  -There is a subpleural nodule within the anterolateral right lower lobe measuring 0.4 cm, image 80/3. Unchanged.  Upper Abdomen: No acute abnormality.  Musculoskeletal: Spondylosis within the thoracic spine. No aggressive lytic or sclerotic bone lesions.  IMPRESSION: 1. Cavitary lung mass within the left upper lobe is not significantly changed in size when compared with 01/10/2019. 2. Similar appearance of mediastinal and hilar adenopathy. 3. Unchanged bilateral pleural effusions.   Electronically Signed By: Kerby Moors M.D. On: 04/04/2019 10:42 I personally reviewed the CT images as well as the PET/CT images.  Impression: Crystal [name] is an 85 year old woman with a past medical history significant for non-Hodgkin's lymphoma in remission, nonischemic cardiomyopathy, chronic systolic left heart failure, hypertension, anxiety, depression, anemia, and hypothyroidism.  She has a cavitary left upper lobe mass and prominent hilar and mediastinal adenopathy.  These were first noted about a year ago and have been relatively stable since then.  Looking back at his CT from 2010 the left upper lobe  process was present at that time, but has progressed significantly.  These areas were hypermetabolic on PET/CT.  Previous bronchoscopy and endobronchial ultrasound were nondiagnostic.  Differential diagnosis includes primary bronchogenic carcinoma, other malignancies, granulomatous disease, and sarcoidosis.  Biopsy is warranted in order to guide appropriate therapy.  I recommended that we proceed with navigational bronchoscopy, endobronchial ultrasound, and possible mediastinoscopy.  The procedure was explained in detail to her.  She is previously had bronchoscopy and endobronchial ultrasound by Dr. Lamonte Sakai.  She understands the mediastinoscopy will only be done if the endobronchial ultrasound is not definitive.  I informed her of the indications, risk, benefits, and alternatives.  She understands the risk include, but are not limited to death, MI, DVT, PE, stroke, bleeding, possible need for transfusion, infection, pneumothorax, esophageal injury, recurrent nerve injury leading to hoarseness, as well as the possibility of other unforeseeable complications.  She accepts the risks and wishes to proceed.  Plan: Electromagnetic navigational bronchoscopy, endobronchial ultrasound, possible mediastinoscopy on Monday, 04/24/2019.  Melrose Nakayama, MD [facility] [phone]           Electronically signed by Melrose Nakayama, MD at 04/18/2019 11:44 AM

## 2019-05-24 NOTE — Brief Op Note (Signed)
05/24/2019  12:12 PM  PATIENT:  Crystal [name]  85 y.o. female  PRE-OPERATIVE DIAGNOSIS:  LEFT APICAL MASS, MEDIASTINAL and HILAR ADENOPATHY  POST-OPERATIVE DIAGNOSIS:  NECROTIZING GRANULOMATOUS DISEASE  PROCEDURE:  Procedure(s): VIDEO BRONCHOSCOPY WITH ENDOBRONCHIAL NAVIGATION (N/A) VIDEO BRONCHOSCOPY WITH ENDOBRONCHIAL ULTRASOUND (N/A) MEDIASTINOSCOPY (N/A)  SURGEON:  Surgeon(s) and Role:    * Melrose Nakayama, MD - Primary    * Lightfoot, Lucile Crater, MD - Assisting  PHYSICIAN ASSISTANT:   ASSISTANTS: none   ANESTHESIA:   general  EBL:  40 mL   BLOOD ADMINISTERED:none  DRAINS: none   LOCAL MEDICATIONS USED:  NONE  SPECIMEN:  Source of Specimen:  LUL mass, lymph node aspirations and biopsies  DISPOSITION OF SPECIMEN:  Path and micro (AFB, fungal)  COUNTS:  YES  TOURNIQUET:  * No tourniquets in log *  DICTATION: .Other Dictation: Dictation Number -  PLAN OF CARE: Discharge to home after PACU  PATIENT DISPOSITION:  PACU - hemodynamically stable.   Delay start of Pharmacological VTE agent (>24hrs) due to surgical blood loss or risk of bleeding: not applicable

## 2019-05-24 NOTE — Progress Notes (Signed)
      [address]       [place] [address]             [phone]      Crystal [name] was slow to wake up after the procedure.  She was not breathing effectively initially, but did not require reintubation. Initially requiring high levels of O2. Currently on RA with acceptable saturations, but still appears relatively marginal from a respiratory standpoint  I would be more comfortable with keeping her for overnight observation for safety.  Discussed with Crystal [name] and her family  Revonda Standard. Roxan Hockey, MD [facility] [phone]'

## 2019-05-25 ENCOUNTER — Observation Stay (HOSPITAL_COMMUNITY): Payer: Medicare Other

## 2019-05-25 ENCOUNTER — Encounter (HOSPITAL_COMMUNITY): Payer: Self-pay | Admitting: Thoracic Surgery (Cardiothoracic Vascular Surgery)

## 2019-05-25 DIAGNOSIS — I110 Hypertensive heart disease with heart failure: Secondary | ICD-10-CM | POA: Diagnosis not present

## 2019-05-25 DIAGNOSIS — I428 Other cardiomyopathies: Secondary | ICD-10-CM | POA: Diagnosis not present

## 2019-05-25 DIAGNOSIS — R590 Localized enlarged lymph nodes: Secondary | ICD-10-CM | POA: Diagnosis not present

## 2019-05-25 DIAGNOSIS — I5022 Chronic systolic (congestive) heart failure: Secondary | ICD-10-CM | POA: Diagnosis not present

## 2019-05-25 DIAGNOSIS — J8410 Pulmonary fibrosis, unspecified: Secondary | ICD-10-CM | POA: Diagnosis not present

## 2019-05-25 DIAGNOSIS — R0602 Shortness of breath: Secondary | ICD-10-CM | POA: Diagnosis not present

## 2019-05-25 DIAGNOSIS — R0902 Hypoxemia: Secondary | ICD-10-CM | POA: Diagnosis not present

## 2019-05-25 LAB — BASIC METABOLIC PANEL
Anion gap: 12 (ref 5–15)
BUN: 25 mg/dL — ABNORMAL HIGH (ref 8–23)
CO2: 28 mmol/L (ref 22–32)
Calcium: 9.4 mg/dL (ref 8.9–10.3)
Chloride: 98 mmol/L (ref 98–111)
Creatinine, Ser: 1.67 mg/dL — ABNORMAL HIGH (ref 0.44–1.00)
GFR calc Af Amer: 32 mL/min — ABNORMAL LOW (ref >60)
GFR calc non Af Amer: 28 mL/min — ABNORMAL LOW (ref >60)
Glucose, Bld: 129 mg/dL — ABNORMAL HIGH (ref 70–99)
Potassium: 3.9 mmol/L (ref 3.5–5.1)
Sodium: 138 mmol/L (ref 135–145)

## 2019-05-25 LAB — ACID FAST SMEAR (AFB, MYCOBACTERIA)
Acid Fast Smear: NEGATIVE
Acid Fast Smear: NEGATIVE
Acid Fast Smear: NEGATIVE

## 2019-05-25 MED ORDER — POTASSIUM CHLORIDE ER 10 MEQ PO TBCR
20.0000 meq | EXTENDED_RELEASE_TABLET | Freq: Once | ORAL | Status: AC
  Administered 2019-05-25: 20 meq via ORAL
  Filled 2019-05-25 (×2): qty 2

## 2019-05-25 MED ORDER — FUROSEMIDE 10 MG/ML IJ SOLN
60.0000 mg | Freq: Once | INTRAMUSCULAR | Status: AC
  Administered 2019-05-25: 60 mg via INTRAVENOUS
  Filled 2019-05-25: qty 6

## 2019-05-25 MED ORDER — ALBUTEROL SULFATE (2.5 MG/3ML) 0.083% IN NEBU
2.5000 mg | INHALATION_SOLUTION | Freq: Once | RESPIRATORY_TRACT | Status: AC
  Administered 2019-05-25: 09:00:00 2.5 mg via RESPIRATORY_TRACT
  Filled 2019-05-25: qty 3

## 2019-05-25 MED ORDER — ACETAMINOPHEN 325 MG PO TABS: 650.0000 mg | ORAL_TABLET | Freq: Four times a day (QID) | ORAL | Status: AC | PRN

## 2019-05-25 NOTE — Progress Notes (Addendum)
I have checked on patient several times since initial evaluation earlier this am. She was down to 1 liter of oxygen via [facility] and has just been placed to room air. She states her breathing is pretty good. She just ate some lunch without difficulty. Nurse reports her oxygenation on room air is 87% and when she ambulated to the bathroom, it decreased briefly into the mid 70's. Home oxygen will be arranged. As discussed with Dr. Roxan Hockey, ok to discharge once home oxygen has been arranged.  Patient seen and examined, agree with above Home this evening with O2  Remo Lipps C. Roxan Hockey, MD [facility] [phone]

## 2019-05-25 NOTE — Progress Notes (Signed)
Discharge instructions reviewed with patient, all questions answered and patient voiced understanding in her own words.  Patient belongings returned to patient and IVs removed.

## 2019-05-25 NOTE — Discharge Summary (Addendum)
Physician Discharge Summary       [address]       [place] [address]             [phone]    Patient ID: Crystal [name] MRN: [id number] DOB/AGE: [birth year] 85 y.o.  Admit date: 05/24/2019 Discharge date: 05/25/2019  Admission Diagnoses: Cavitary lung mass with Mediastinal adenopathy   Discharge Diagnoses: Necrotizing granulomatous disease involving left upper lobe and mediastinal lymph nodes 1. S/p video bronchoscopy, EBUS, and mediastinoscopy  2. Post op hypoxia 3. History of non ischemic cardiomyopathy 4. History of CHF (congestive heart failure) ([place]) 5. History of non Hodgkin's lymphoma ([place]) 6. History of hypertension 7. History of hypothyroid 8. History of hypercalcemia 9. History of anemia 10. History of anxiety and depression     Procedure (s):  VIDEO BRONCHOSCOPY WITH ENDOBRONCHIAL NAVIGATION, VIDEO BRONCHOSCOPY WITH ENDOBRONCHIAL ULTRASOUND, MEDIASTINOSCOPY by Dr. Roxan Hockey on 05/24/2019.  History of Presenting Illness Crystal [name] is an 85 year old woman with a history of non-Hodgkin's lymphoma, nonischemic cardiomyopathy, chronic systolic left heart failure, hypertension, hypothyroidism, anxiety, depression, and anemia. In July 2018 she presented with neck pain. A CT of the neck showed a left apical abnormality. A chest CT showed a 3.9 x 2.9 cm cavitary lesion in the left apex. She also had bilateral hilar and mediastinal adenopathy and multiple other lung nodules. Dr. Lamonte Sakai did a bronchoscopy and endobronchial ultrasound. Samples were nondiagnostic.  In January 2020 she was admitted after a near syncopal episode. She was in decompensated heart failure. A CT showed left apical mass and adenopathy were unchanged to slightly smaller. She had bilateral pleural effusions. She then was referred for possible mediastinal ostomy.  I saw her in April. I did fungal antibody and QuantiFERON testing. That all returned negative. She was in  decompensated heart failure and with the COVID situation we felt it was best to defer biopsy.  She feels much better. Her shortness of breath is improved significantly. She does still have some swelling in her legs. She had a virtual visit with Dr. Stanford Breed couple of weeks ago. Dr. Roxan Hockey recommended that we proceed with navigational bronchoscopy, endobronchial ultrasound, and possible mediastinoscopy. The procedure was explained in detail to her. She is previously had bronchoscopy and endobronchial ultrasound by Dr. Lamonte Sakai. She understands the mediastinoscopy will only be done if the endobronchial ultrasound is not definitive. Dr. Roxan Hockey informed her of the indications, risk, benefits, and alternatives. She presented to [facility]08 in order to undergo the aforementioned procedure.  Brief Hospital Course:  She was slow to wake up after surgery and because of her respiratory status, she was admitted over night. She later again had worsening respiratory status on the floor, required bi pap, IV Lasix, and she was transferred to the ICU for closer monitoring. She has done well with bipap overnight. She is being weaned to nasal cannula. Chest x ray this am shows improving aeration with small pleural effusions and atelectasis. She was weaned off bi pap and is on 1 liter of oxygen with saturation at 94% as of 12:20 pm. She has just been put on room air. Her oxygenation is 87% on room air and decreased into the mid 70's with ambulation to the bathroom. Home oxygen will be arranged. She is finishing eating lunch. She states she feels pretty good. As discussed with Dr. Roxan Hockey, will discharge once home oxygen has been arranged.   Latest Vital Signs: Blood pressure 112/70, pulse 83, temperature (!) 97.5 F (36.4  C), temperature source Oral, resp. rate (!) 29, height 5\' 1"  (1.549 m), weight 56.3 kg, SpO2 99 %.  Physical Exam: General appearance: alert and cooperative Neurologic: intact  Heart: RRR Lungs: Somewhat coarse but on bipap Abdomen: Soft, non tender, bowel sounds present Extremities: SCDs in place Wound: Clean and dry. Trace neck swelling  Discharge Condition: Stable and discharge to home.  Recent laboratory studies:  Lab Results  Component Value Date   WBC 3.4 (L) 05/22/2019   HGB 12.4 05/22/2019   HCT 39.4 05/22/2019   MCV 92.1 05/22/2019   PLT 156 05/22/2019   Lab Results  Component Value Date   NA 138 05/25/2019   K 3.9 05/25/2019   CL 98 05/25/2019   CO2 28 05/25/2019   CREATININE 1.67 (H) 05/25/2019   GLUCOSE 129 (H) 05/25/2019      Diagnostic Studies: Dg Chest 2 View  Result Date: 05/22/2019 CLINICAL DATA:  Preop bronchoscopy EXAM: CHEST - 2 VIEW COMPARISON:  01/24/2019 FINDINGS: Upper normal size of cardiac silhouette. Prominence of RIGHT hilum. LEFT basilar atelectasis and pleural effusion. Decreased RIGHT pleural effusion since previous exam. Atherosclerotic calcifications aorta. No acute infiltrate or pneumothorax. Improved aeration at RIGHT base or since previous study. Bones demineralized. IMPRESSION: LEFT pleural effusion and basilar atelectasis little changed. Improved aeration at RIGHT lung base since previous study. Electronically Signed   By: Lavonia Dana M.D.   On: 05/22/2019 15:58   Dg Chest Port 1 View  Result Date: 05/25/2019 CLINICAL DATA:  Shortness of breath. Respiratory distress. EXAM: PORTABLE CHEST 1 VIEW COMPARISON:  Chest x-rays dated 05/24/2019 and 05/22/2019 and chest CT dated 04/04/2019 FINDINGS: There is significantly improved aeration in both lungs with residual small effusions and atelectasis, most prominent at the left lung base. Overall heart size and pulmonary vascularity are normal. Bilateral apical pleural capping is shown to be due to pleural fluid on the prior chest CT. No acute bone abnormality. IMPRESSION: Significantly improved aeration bilaterally with residual small effusions and atelectasis. The Electronically  Signed   By: Lorriane Shire M.D.   On: 05/25/2019 06:20   Dg Chest Port 1 View  Result Date: 05/24/2019 CLINICAL DATA:  Left apical mass. EXAM: PORTABLE CHEST 1 VIEW COMPARISON:  Radiographs of May 22, 2019. FINDINGS: Stable cardiomediastinal silhouette. No pneumothorax is noted. Fluid is noted in the right minor fissure. Right hilar prominence is again noted. Mildly increased left lower lobe atelectasis or infiltrate is noted with associated pleural effusion. Bony thorax is unremarkable. IMPRESSION: Mildly increased left lower lobe atelectasis or infiltrate is noted with associated pleural effusion. Fluid is now noted in the right minor fissure. Stable right hilar enlargement is noted. Electronically Signed   By: Marijo Conception M.D.   On: 05/24/2019 14:37   Dg C-arm Bronchoscopy  Result Date: 05/24/2019 C-ARM BRONCHOSCOPY: Fluoroscopy was utilized by the requesting physician.  No radiographic interpretation.         Discharge Medications: Allergies as of 05/25/2019      Reactions   Lisinopril Swelling   Facial and lip swelling   Iohexol Itching         Medication List    TAKE these medications   acetaminophen 325 MG tablet Commonly known as: TYLENOL Take 2 tablets (650 mg total) by mouth every 6 (six) hours as needed for fever, headache, mild pain or moderate pain.   AeroChamber MV inhaler Use as instructed   albuterol 108 (90 Base) MCG/ACT inhaler Commonly known as: VENTOLIN HFA INHALE 1  TO 2 PUFFS BY MOUTH EVERY 4 HOURS AS NEEDED FOR WHEEZING OR SHORTNESS OF BREATH What changed: See the new instructions.   ALPRAZolam 0.25 MG tablet Commonly known as: XANAX Take 1 tablet by mouth twice daily as needed for anxiety What changed:   reasons to take this  additional instructions   benzonatate 100 MG capsule Commonly known as: TESSALON TAKE 1 TO 2 CAPSULES BY MOUTH THREE TIMES DAILY AS NEEDED FOR COUGH   carvedilol 3.125 MG tablet Commonly known as: COREG Take 1 tablet  (3.125 mg total) by mouth 2 (two) times daily.   CENTRUM PO Take 1 tablet by mouth daily.   Fish Oil 1000 MG Caps Take 1,000 mg by mouth daily.   furosemide 40 MG tablet Commonly known as: LASIX Take 1.5 tablets (60 mg total) by mouth daily. May addt'l 20mg  (1/2 tab) as needed for addt'l swelling in LE What changed:   how much to take  when to take this  additional instructions   levothyroxine 25 MCG tablet Commonly known as: SYNTHROID Take 1 tablet (25 mcg total) by mouth daily before breakfast.   magnesium hydroxide 400 MG/5ML suspension Commonly known as: MILK OF MAGNESIA Take 15 mLs by mouth daily as needed for mild constipation.   PROBIOTIC DAILY PO Take 1 capsule by mouth daily.   traMADol 50 MG tablet Commonly known as: ULTRAM Take 1 tablet (50 mg total) by mouth every 6 (six) hours as needed for moderate pain or severe pain.       Follow Up Appointments: Follow-up Information    Melrose Nakayama, MD. Go on 05/30/2019.   Specialty: Cardiothoracic Surgery Why: Appointment time is at 4:15 pm Contact information: [address] [place] [address] [phone]           Signed: Sharalyn Ink [name] 05/25/2019, 12:28 PM

## 2019-05-25 NOTE — TOC Initial Note (Signed)
Transition of Care [facility]) - Initial/Assessment Note    Patient Details  Name: Crystal [name] MRN: [id number] Date of Birth: 1934-06-25  Transition of Care [facility]) CM/SW Contact:    Eileen Stanford, LCSW Phone Number: 05/25/2019, 1:26 PM  Clinical Narrative:   Pt alert and oriented. Pt will need Home 02. Pt had no preference for agency. Home 02 will be arranged with [facility]--pt agreeable.               Expected Discharge Plan: Home/Self Care Barriers to Discharge: No Barriers Identified   Patient Goals and CMS Choice Patient states their goals for this hospitalization and ongoing recovery are:: to get better      Expected Discharge Plan and Services Expected Discharge Plan: Home/Self Care In-house Referral: NA Discharge Planning Services: NA Post Acute Care Choice: NA Living arrangements for the past 2 months: Single Family Home Expected Discharge Date: 05/25/19               DME Arranged: Oxygen DME Agency: [facility] Date DME Agency Contacted: 05/25/19 Time DME Agency Contacted: 12   [place] Arranged: NA          Prior Living Arrangements/Services Living arrangements for the past 2 months: Single Family Home Lives with:: Spouse Patient language and need for interpreter reviewed:: Yes Do you feel safe going back to the place where you live?: Yes      Need for Family Participation in Patient Care: No (Comment) Care giver support system in place?: Yes (comment)   Criminal Activity/Legal Involvement Pertinent to Current Situation/Hospitalization: No - Comment as needed  Activities of Daily Living Home Assistive Devices/Equipment: None ADL Screening (condition at time of admission) Patient's cognitive ability adequate to safely complete daily activities?: Yes Is the patient deaf or have difficulty hearing?: No Does the patient have difficulty seeing, even when wearing glasses/contacts?: No Does the patient have difficulty concentrating, remembering, or  making decisions?: Yes Patient able to express need for assistance with ADLs?: Yes Does the patient have difficulty dressing or bathing?: Yes Independently performs ADLs?: No Communication: Needs assistance Is this a change from baseline?: Change from baseline, expected to last <3 days Dressing (OT): Needs assistance Is this a change from baseline?: Change from baseline, expected to last <3days Grooming: Needs assistance Is this a change from baseline?: Change from baseline, expected to last <3 days Feeding: Needs assistance Is this a change from baseline?: Change from baseline, expected to last <3 days Bathing: Needs assistance Is this a change from baseline?: Change from baseline, expected to last <3 days Toileting: Needs assistance Is this a change from baseline?: Change from baseline, expected to last <3 days In/Out Bed: Needs assistance Is this a change from baseline?: Change from baseline, expected to last <3 days Walks in Home: Independent Does the patient have difficulty walking or climbing stairs?: Yes Weakness of Legs: None Weakness of Arms/Hands: None  Permission Sought/Granted Permission sought to share information with : Facility Sport and exercise psychologist, Family Supports    Share Information with NAME: Jeneen Rinks  Permission granted to share info w AGENCY: [facility]  Permission granted to share info w Relationship: Spouse     Emotional Assessment Appearance:: Appears stated age Attitude/Demeanor/Rapport: (pt was appropriate) Affect (typically observed): Accepting, Appropriate, Calm Orientation: : Oriented to Self, Oriented to Place, Oriented to  Time, Oriented to Situation Alcohol / Substance Use: Not Applicable Psych Involvement: No (comment)  Admission diagnosis:  Mediastinal adenopathy [R59.0] Patient Active Problem List   Diagnosis Date  Noted  . Mediastinal adenopathy 05/24/2019  . Acute on chronic respiratory failure with hypoxemia ([place]) 01/12/2019  . CKD (chronic  kidney disease) stage 3, GFR 30-59 ml/min (HCC) 11/30/2018  . Acute on chronic systolic CHF (congestive heart failure), NYHA class 4 ([place]) 11/29/2018  . Cavitating mass in left upper lung lobe 06/29/2018  . Hilar adenopathy 06/29/2018  . Abnormal CT of the chest 06/22/2018  . IBS (irritable bowel syndrome) 02/21/2018  . Anemia 08/21/2017  . Hypercalcemia 04/20/2017  . Diffuse large B-cell lymphoma of lymph nodes of neck ([place]) 01/21/2017  . Mitral valve regurgitation, Moderate to severe 11/26/2015  . Chronic systolic heart failure ([place]) 11/26/2015  . Hyperlipidemia, mixed 10/17/2015  . Vitamin D deficiency 10/17/2015  . History of angioedema 06/11/2015  . Hematuria 06/11/2015  . Preventative health care 12/14/2014  . Hyperglycemia 03/24/2012  . Diverticulosis 10/01/2011  . Thrombocytopenia ([place]) 10/02/2010  . Hypothyroidism 01/24/2010  . RHEUMATOID FACTOR, POSITIVE 01/24/2010  . Non Hodgkin's lymphoma ([place]) 01/09/2010  . RAYNAUDS SYNDROME 01/09/2010  . Anxiety state 09/20/2009  . Essential hypertension 09/20/2009  . Nonischemic cardiomyopathy ([place]) 09/20/2009  . Congestive heart failure ([place]) 09/20/2009   PCP:  Mosie Lukes, MD Pharmacy:   [facility] [place] [address] Phone: [phone] Fax: [phone]     Social Determinants of Health (SDOH) Interventions    Readmission Risk Interventions No flowsheet data found.

## 2019-05-25 NOTE — Progress Notes (Signed)
Patient placed on room air, patient's oxygen saturation dropping down to 86- 87%.   RN assisted patient to and from the [facility] from the chair prior to oxygen saturation levels dropping, patient's oxygen saturation levels dropped to 76% with activity.  Paged PA Tacy Dura, [name] is going to order home oxygen for the patient and RN okay to discharge patient after home oxygen received if all other vital signs remain stable.

## 2019-05-25 NOTE — Progress Notes (Signed)
Patient's daughter called to receive a status update on patient.  RN provided update and informed patient's daughter that patient will be discharged today after home oxygen set up.  Patient's daughter confirmed that patient's husband will be picking up patient. RN unsure at what time patient will be ready for discharge but will call patient's husband when oxygen delivered to inform him of discharge time.  Patient's daughter advised would call patient's husband and inform him of plan.

## 2019-05-25 NOTE — Care Management (Signed)
Oxygen setup has been completed at the patients home. Patients spouse will provide the portable oxygen tank and provide transportation home.  Midge Minium RN, BSN, NCM-BC, ACM-RN [phone]

## 2019-05-25 NOTE — Progress Notes (Addendum)
TCTS DAILY ICU PROGRESS NOTE                   [address]            [place] [address]          [id number]   1 Day Post-Op Procedure(s) (LRB): VIDEO BRONCHOSCOPY WITH ENDOBRONCHIAL NAVIGATION (N/A) VIDEO BRONCHOSCOPY WITH ENDOBRONCHIAL ULTRASOUND (N/A) MEDIASTINOSCOPY (N/A)  Total Length of Stay:  LOS: 0 days   Subjective: She was transferred from 4E to ICU due to worsening respiratory status last evening. She was given IV Lasix  Objective: Vital signs in last 24 hours: Temp:  [96.3 F (35.7 C)-97.8 F (36.6 C)] 96.3 F (35.7 C) (07/09 0000) Pulse Rate:  [66-100] 72 (07/09 0600) Cardiac Rhythm: Normal sinus rhythm;Bundle branch block (07/09 0400) Resp:  [17-38] 26 (07/09 0600) BP: (96-140)/(47-118) 111/66 (07/09 0600) SpO2:  [81 %-100 %] 98 % (07/09 0600) FiO2 (%):  [30 %] 30 % (07/09 0400) Weight:  [56.3 kg] 56.3 kg (07/08 2130)  Filed Weights   05/24/19 0624 05/24/19 2130  Weight: 54.4 kg 56.3 kg    Weight change: 1.868 kg      Intake/Output from previous day: 07/08 0701 - 07/09 0700 In: 1184.4 [I.V.:1184.4] Out: 1365 [Urine:1325; Blood:40]  Intake/Output this shift: No intake/output data recorded.  Current Meds: Scheduled Meds: . carvedilol  3.125 mg Oral BID  . chlorhexidine  15 mL Mouth Rinse BID  . Chlorhexidine Gluconate Cloth  6 each Topical Daily  . multivitamin with minerals  1 tablet Oral Daily   And  . folic acid  0.5 mg Oral Daily  . furosemide  60 mg Oral Daily  . levothyroxine  25 mcg Oral QAC breakfast  . mouth rinse  15 mL Mouth Rinse q12n4p   Continuous Infusions: . sodium chloride 10 mL/hr at 05/25/19 0600   PRN Meds:.acetaminophen, albuterol, ALPRAZolam, benzonatate, fentaNYL (SUBLIMAZE) injection, magnesium hydroxide, traMADol  General appearance: alert and cooperative Neurologic: intact Heart: RRR Lungs: Somewhat coarse but on bipap Abdomen: Soft, non tender, bowel sounds present Extremities: SCDs in  place Wound: Clean and dry. Trace neck swelling  Lab Results: CBC: Recent Labs    05/22/19 1059  WBC 3.4*  HGB 12.4  HCT 39.4  PLT 156   BMET:  Recent Labs    05/22/19 1059  NA 136  K 3.7  CL 98  CO2 27  GLUCOSE 141*  BUN 29*  CREATININE 1.80*  CALCIUM 10.1    CMET: Lab Results  Component Value Date   WBC 3.4 (L) 05/22/2019   HGB 12.4 05/22/2019   HCT 39.4 05/22/2019   PLT 156 05/22/2019   GLUCOSE 141 (H) 05/22/2019   CHOL 172 08/29/2018   TRIG 98.0 08/29/2018   HDL 55.90 08/29/2018   LDLCALC 97 08/29/2018   ALT 26 05/22/2019   AST 34 05/22/2019   NA 136 05/22/2019   K 3.7 05/22/2019   CL 98 05/22/2019   CREATININE 1.80 (H) 05/22/2019   BUN 29 (H) 05/22/2019   CO2 27 05/22/2019   TSH 4.755 (H) 11/29/2018   INR 1.3 (H) 05/22/2019   HGBA1C 5.5 08/29/2018      PT/INR:  Recent Labs    05/22/19 1059  LABPROT 15.5*  INR 1.3*   Radiology: Dg Chest Port 1 View  Result Date: 05/25/2019 CLINICAL DATA:  Shortness of breath. Respiratory distress. EXAM: PORTABLE CHEST 1 VIEW COMPARISON:  Chest x-rays dated 05/24/2019 and 05/22/2019 and chest CT  dated 04/04/2019 FINDINGS: There is significantly improved aeration in both lungs with residual small effusions and atelectasis, most prominent at the left lung base. Overall heart size and pulmonary vascularity are normal. Bilateral apical pleural capping is shown to be due to pleural fluid on the prior chest CT. No acute bone abnormality. IMPRESSION: Significantly improved aeration bilaterally with residual small effusions and atelectasis. The Electronically Signed   By: Lorriane Shire M.D.   On: 05/25/2019 06:20   Dg Chest Port 1 View  Result Date: 05/24/2019 CLINICAL DATA:  Left apical mass. EXAM: PORTABLE CHEST 1 VIEW COMPARISON:  Radiographs of May 22, 2019. FINDINGS: Stable cardiomediastinal silhouette. No pneumothorax is noted. Fluid is noted in the right minor fissure. Right hilar prominence is again noted. Mildly  increased left lower lobe atelectasis or infiltrate is noted with associated pleural effusion. Bony thorax is unremarkable. IMPRESSION: Mildly increased left lower lobe atelectasis or infiltrate is noted with associated pleural effusion. Fluid is now noted in the right minor fissure. Stable right hilar enlargement is noted. Electronically Signed   By: Marijo Conception M.D.   On: 05/24/2019 14:37   Dg C-arm Bronchoscopy  Result Date: 05/24/2019 C-ARM BRONCHOSCOPY: Fluoroscopy was utilized by the requesting physician.  No radiographic interpretation.     Assessment/Plan: S/P Procedure(s) (LRB): VIDEO BRONCHOSCOPY WITH ENDOBRONCHIAL NAVIGATION (N/A) VIDEO BRONCHOSCOPY WITH ENDOBRONCHIAL ULTRASOUND (N/A) MEDIASTINOSCOPY (N/A) 1. CV-History of cardiomyopathy. SR in the 70's this am. 2. Pulmonary-On Bipap this am and respiratory status has improved. CXR this am shows improved aeration, small pleural effusions and atelectasis. Will  to wean down to [place]. Will discuss further management with Dr. Roxan Hockey. 3. Creatinine prior to admission 1.8. Await this am's result    Donielle Liston Alba PA-C 05/25/2019 7:04 AM   Patient seen and examined, agree with above Events overnight noted Still has some wheezing but overall looks dramatically better than she did yesterday afternoon. Will give IV lasix again this AM Possibly home later today  [place]. Roxan Hockey, MD [facility] [phone]

## 2019-05-29 ENCOUNTER — Other Ambulatory Visit: Payer: Self-pay

## 2019-05-29 ENCOUNTER — Ambulatory Visit (INDEPENDENT_AMBULATORY_CARE_PROVIDER_SITE_OTHER): Payer: Medicare Other | Admitting: Family Medicine

## 2019-05-29 VITALS — BP 90/70 | HR 83 | Temp 98.2°F | Resp 18 | Wt 123.8 lb

## 2019-05-29 DIAGNOSIS — E559 Vitamin D deficiency, unspecified: Secondary | ICD-10-CM | POA: Diagnosis not present

## 2019-05-29 DIAGNOSIS — J984 Other disorders of lung: Secondary | ICD-10-CM | POA: Diagnosis not present

## 2019-05-29 DIAGNOSIS — R0902 Hypoxemia: Secondary | ICD-10-CM

## 2019-05-29 DIAGNOSIS — D696 Thrombocytopenia, unspecified: Secondary | ICD-10-CM | POA: Diagnosis not present

## 2019-05-29 DIAGNOSIS — E782 Mixed hyperlipidemia: Secondary | ICD-10-CM

## 2019-05-29 DIAGNOSIS — R739 Hyperglycemia, unspecified: Secondary | ICD-10-CM

## 2019-05-29 DIAGNOSIS — N183 Chronic kidney disease, stage 3 unspecified: Secondary | ICD-10-CM

## 2019-05-29 DIAGNOSIS — I10 Essential (primary) hypertension: Secondary | ICD-10-CM | POA: Diagnosis not present

## 2019-05-29 DIAGNOSIS — K5790 Diverticulosis of intestine, part unspecified, without perforation or abscess without bleeding: Secondary | ICD-10-CM | POA: Diagnosis not present

## 2019-05-29 DIAGNOSIS — D649 Anemia, unspecified: Secondary | ICD-10-CM

## 2019-05-29 DIAGNOSIS — I428 Other cardiomyopathies: Secondary | ICD-10-CM

## 2019-05-29 DIAGNOSIS — E039 Hypothyroidism, unspecified: Secondary | ICD-10-CM | POA: Diagnosis not present

## 2019-05-29 LAB — AEROBIC/ANAEROBIC CULTURE W GRAM STAIN (SURGICAL/DEEP WOUND)
Culture: NO GROWTH
Culture: NO GROWTH
Culture: NO GROWTH
Gram Stain: NONE SEEN

## 2019-05-29 LAB — CBC WITH DIFFERENTIAL/PLATELET
Basophils Absolute: 0 10*3/uL (ref 0.0–0.1)
Basophils Relative: 0.8 % (ref 0.0–3.0)
Eosinophils Absolute: 0.1 10*3/uL (ref 0.0–0.7)
Eosinophils Relative: 2.9 % (ref 0.0–5.0)
HCT: 35.7 % — ABNORMAL LOW (ref 36.0–46.0)
Hemoglobin: 11.6 g/dL — ABNORMAL LOW (ref 12.0–15.0)
Lymphocytes Relative: 21.4 % (ref 12.0–46.0)
Lymphs Abs: 0.5 10*3/uL — ABNORMAL LOW (ref 0.7–4.0)
MCHC: 32.6 g/dL (ref 30.0–36.0)
MCV: 91.3 fl (ref 78.0–100.0)
Monocytes Absolute: 0.2 10*3/uL (ref 0.1–1.0)
Monocytes Relative: 10.9 % (ref 3.0–12.0)
Neutro Abs: 1.4 10*3/uL (ref 1.4–7.7)
Neutrophils Relative %: 64 % (ref 43.0–77.0)
Platelets: 124 10*3/uL — ABNORMAL LOW (ref 150.0–400.0)
RBC: 3.91 Mil/uL (ref 3.87–5.11)
RDW: 15.6 % — ABNORMAL HIGH (ref 11.5–15.5)
WBC: 2.2 10*3/uL — ABNORMAL LOW (ref 4.0–10.5)

## 2019-05-29 LAB — COMPREHENSIVE METABOLIC PANEL
ALT: 31 U/L (ref 0–35)
AST: 34 U/L (ref 0–37)
Albumin: 3.3 g/dL — ABNORMAL LOW (ref 3.5–5.2)
Alkaline Phosphatase: 100 U/L (ref 39–117)
BUN: 33 mg/dL — ABNORMAL HIGH (ref 6–23)
CO2: 37 mEq/L — ABNORMAL HIGH (ref 19–32)
Calcium: 9.9 mg/dL (ref 8.4–10.5)
Chloride: 98 mEq/L (ref 96–112)
Creatinine, Ser: 1.29 mg/dL — ABNORMAL HIGH (ref 0.40–1.20)
GFR: 47.71 mL/min — ABNORMAL LOW (ref >60.00)
Glucose, Bld: 137 mg/dL — ABNORMAL HIGH (ref 70–99)
Potassium: 3.9 mEq/L (ref 3.5–5.1)
Sodium: 139 mEq/L (ref 135–145)
Total Bilirubin: 1.1 mg/dL (ref 0.2–1.2)
Total Protein: 8 g/dL (ref 6.0–8.3)

## 2019-05-29 LAB — LIPID PANEL
Cholesterol: 136 mg/dL (ref 0–200)
HDL: 51.6 mg/dL (ref >39.00)
LDL Cholesterol: 72 mg/dL (ref 0–99)
NonHDL: 84.72
Total CHOL/HDL Ratio: 3
Triglycerides: 65 mg/dL (ref 0.0–149.0)
VLDL: 13 mg/dL (ref 0.0–40.0)

## 2019-05-29 LAB — HEMOGLOBIN A1C: Hgb A1c MFr Bld: 5.9 % (ref 4.6–6.5)

## 2019-05-29 LAB — TSH: TSH: 4.97 u[IU]/mL — ABNORMAL HIGH (ref 0.35–4.50)

## 2019-05-29 LAB — VITAMIN D 25 HYDROXY (VIT D DEFICIENCY, FRACTURES): VITD: 58.4 ng/mL (ref 30.00–100.00)

## 2019-05-29 LAB — MAGNESIUM: Magnesium: 2.1 mg/dL (ref 1.5–2.5)

## 2019-05-29 MED ORDER — CARVEDILOL 3.125 MG PO TABS: 3.1250 mg | ORAL_TABLET | Freq: Every morning | ORAL | Status: DC

## 2019-05-29 NOTE — Assessment & Plan Note (Signed)
Encouraged heart healthy diet, increase exercise, avoid trans fats, consider a krill oil cap daily 

## 2019-05-29 NOTE — Assessment & Plan Note (Signed)
Still under treated will increase Levothyroxine to 50 mcg daily

## 2019-05-29 NOTE — Assessment & Plan Note (Signed)
hgba1c acceptable, minimize simple carbs. Increase exercise as tolerated.  

## 2019-05-29 NOTE — Assessment & Plan Note (Signed)
Supplement and monitor 

## 2019-05-29 NOTE — Op Note (Signed)
NAME: Crystal [name], Crystal [name] MEDICAL RECORD [id number] ACCOUNT [id number] DATE OF BIRTH:Jun 16, 1934 FACILITY: [facility] LOCATION: [facility] PHYSICIAN:Lealand Elting C. Rufino Staup, MD  OPERATIVE REPORT  DATE OF PROCEDURE:  05/24/2019  PREOPERATIVE DIAGNOSIS:  Left upper lobe cavitary mass with mediastinal and hilar adenopathy.  POSTOPERATIVE DIAGNOSIS:  Noncaseating granulomatous disease.  PROCEDURE: Electromagnetic navigational bronchoscopy with needle aspirations, brushings and transbronchial biopsies Endobronchial ultrasound with mediastinal lymph node aspirations, Mediastinoscopy.  SURGEON:  Modesto Charon, MD  ASSISTANT:  Melodie Bouillon, MD  ANESTHESIA:  General.  FINDINGS:  Needle aspirations, brushings and biopsies- nondiagnostic mediastinoscopy.  Frozen section revealed necrotizing granulomatous disease.  CLINICAL NOTE:  Crystal [name] is an 85 year old woman with a history of lymphoma and multiple other medical problems.  She presented in 2018 with neck pain.  A CT of the chest showed a 3.9 x 2.9 cm cavitary mass in the left apex along with bilateral hilar  and mediastinal adenopathy.  Bronchoscopy and endobronchial ultrasound were nondiagnostic.  On followup, she had a repeat CT in January 2020, which showed similar findings.  She was reluctant to undergo biopsies at that time. After similar findings were once again noted on  a PET CT, she was again advised to undergo bronchoscopy and endobronchial ultrasound and possible mediastinoscopy.  The indications, risks, benefits, and alternatives were discussed in detail with the patient.  She understood and accepted the risks and  wished to proceed.  OPERATIVE NOTE:  Crystal [name] was brought to the operating room on 05/24/2019.  Planning for the navigational bronchoscopy was done prior to induction.  She was anesthetized and intubated.  Intravenous antibiotics were administered prior to beginning the procedure and sequential compression devices were  placed on the calves for DVT prophylaxis. A timeout was performed.  Flexible fiberoptic bronchoscopy was performed via the endotracheal tube.  It revealed normal endobronchial anatomy with no endobronchial lesions to the level of the subsegmental bronchi.  There was some blunting of the carinas, particularly in the left  hilum.  The locatable guide for navigation was placed and registration was performed.  There was good correlation of the video and virtual bronchoscopy.  The guide was advanced to the appropriate subsegmental bronchus and advanced to within 2 centimeters of  the center of the lesion.  Fluoroscopy was used for all sampling.  Multiple needle aspirations and brushings were performed.  The locatable guide was reinserted after every third to fourth sample to ensure continued alignment and proximity to the lesion.   While awaiting the results of the aspirations, multiple biopsies were performed.  These were sent for permanent pathology.  The needle aspirations and brushings were both nondiagnostic.  Additional biopsies were taken for permanent pathology as  well as for AFB and fungal cultures.  The bronchoscope was withdrawn.  The endobronchial ultrasound probe was advanced.  Systematic inspection of the hilar and mediastinal areas was carried out and appropriate lymph nodes for sampling were identified.  Multiple aspirations were performed from a level 7 lymph node as well  as an 11L lymph node.  Quick prep with these samples was nondiagnostic and the decision was made to proceed with mediastinoscopy.  The endobronchial ultrasound probe was removed.  The bronchoscope was reinserted and a final inspection was made.  There  was no significant ongoing bleeding.  The bronchoscope was removed.  The neck and chest were prepped and draped in the usual sterile fashion.  Another timeout was performed.  A transverse incision was made one fingerbreadth above the sternal notch.  This was  carried through  the skin and subcutaneous tissue.  The strap muscles were separated in the midline.  The pretracheal fascia was  identified and incised and the pretracheal plane was developed bluntly into the mediastinum.  The mediastinoscope was inserted and systematic inspection of the mediastinal lymph node stations were carried out.  There was an abnormal 2R node that was  biopsied and sent for frozen section.  Additional biopsies were obtained and sent for both AFB and fungal cultures and permanent pathology.  Adjacent to this, there was a second 2R node that likewise was biopsied.  The bronchoscope was inserted further  down and level 7 and 4R nodes were biopsied as well.  The frozen section returned showing noncaseating granulomas.  Additional biopsies were obtained for AFB and fungal cultures.  The wound was packed with gauze.  After 5 minutes, the gauze packing was  removed.  The mediastinoscope was reinserted.  There was no ongoing bleeding.  The mediastinoscope was withdrawn.  The incision was closed in 2 layers with 3-0 Vicryl sutures to close the platysmal layer and a 4-0 Vicryl subcuticular suture.  Dermabond  was applied to the incision.  All sponge, needle and instrument counts were correct at the end of the procedure.  The total fluoroscopy time during the bronchoscopic biopsies was 5.8 minutes and the total dose was 65 milligrays.  The patient was extubated in the operating room and taken to the [facility] in good condition.  TN/NUANCE  D:05/29/2019 T:05/29/2019 JOB:[id number]

## 2019-05-29 NOTE — Patient Instructions (Signed)
Blood pressure cuff, and pulse oximeter Drop Carvedilol to once a day in the morning Deep breathing Hypertension, Adult High blood pressure (hypertension) is when the force of blood pumping through the arteries is too strong. The arteries are the blood vessels that carry blood from the heart throughout the body. Hypertension forces the heart to work harder to pump blood and may cause arteries to become narrow or stiff. Untreated or uncontrolled hypertension can cause a heart attack, heart failure, a stroke, kidney disease, and other problems. A blood pressure reading consists of a higher number over a lower number. Ideally, your blood pressure should be below 120/80. The first ("top") number is called the systolic pressure. It is a measure of the pressure in your arteries as your heart beats. The second ("bottom") number is called the diastolic pressure. It is a measure of the pressure in your arteries as the heart relaxes. What are the causes? The exact cause of this condition is not known. There are some conditions that result in or are related to high blood pressure. What increases the risk? Some risk factors for high blood pressure are under your control. The following factors may make you more likely to develop this condition:  Smoking.  Having type 2 diabetes mellitus, high cholesterol, or both.  Not getting enough exercise or physical activity.  Being overweight.  Having too much fat, sugar, calories, or salt (sodium) in your diet.  Drinking too much alcohol. Some risk factors for high blood pressure may be difficult or impossible to change. Some of these factors include:  Having chronic kidney disease.  Having a family history of high blood pressure.  Age. Risk increases with age.  Race. You may be at higher risk if you are African American.  Gender. Men are at higher risk than women before age 54. After age 76, women are at higher risk than men.  Having obstructive sleep  apnea.  Stress. What are the signs or symptoms? High blood pressure may not cause symptoms. Very high blood pressure (hypertensive crisis) may cause:  Headache.  Anxiety.  Shortness of breath.  Nosebleed.  Nausea and vomiting.  Vision changes.  Severe chest pain.  Seizures. How is this diagnosed? This condition is diagnosed by measuring your blood pressure while you are seated, with your arm resting on a flat surface, your legs uncrossed, and your feet flat on the floor. The cuff of the blood pressure monitor will be placed directly against the skin of your upper arm at the level of your heart. It should be measured at least twice using the same arm. Certain conditions can cause a difference in blood pressure between your right and left arms. Certain factors can cause blood pressure readings to be lower or higher than normal for a short period of time:  When your blood pressure is higher when you are in a health care provider's office than when you are at home, this is called white coat hypertension. Most people with this condition do not need medicines.  When your blood pressure is higher at home than when you are in a health care provider's office, this is called masked hypertension. Most people with this condition may need medicines to control blood pressure. If you have a high blood pressure reading during one visit or you have normal blood pressure with other risk factors, you may be asked to:  Return on a different day to have your blood pressure checked again.  Monitor your blood pressure at home  for 1 week or longer. If you are diagnosed with hypertension, you may have other blood or imaging tests to help your health care provider understand your overall risk for other conditions. How is this treated? This condition is treated by making healthy lifestyle changes, such as eating healthy foods, exercising more, and reducing your alcohol intake. Your health care provider may  prescribe medicine if lifestyle changes are not enough to get your blood pressure under control, and if:  Your systolic blood pressure is above 130.  Your diastolic blood pressure is above 80. Your personal target blood pressure may vary depending on your medical conditions, your age, and other factors. Follow these instructions at home: Eating and drinking   Eat a diet that is high in fiber and potassium, and low in sodium, added sugar, and fat. An example eating plan is called the DASH (Dietary Approaches to Stop Hypertension) diet. To eat this way: ? Eat plenty of fresh fruits and vegetables. Try to fill one half of your plate at each meal with fruits and vegetables. ? Eat whole grains, such as whole-wheat pasta, brown rice, or whole-grain bread. Fill about one fourth of your plate with whole grains. ? Eat or drink low-fat dairy products, such as skim milk or low-fat yogurt. ? Avoid fatty cuts of meat, processed or cured meats, and poultry with skin. Fill about one fourth of your plate with lean proteins, such as fish, chicken without skin, beans, eggs, or tofu. ? Avoid pre-made and processed foods. These tend to be higher in sodium, added sugar, and fat.  Reduce your daily sodium intake. Most people with hypertension should eat less than 1,500 mg of sodium a day.  Do not drink alcohol if: ? Your health care provider tells you not to drink. ? You are pregnant, may be pregnant, or are planning to become pregnant.  If you drink alcohol: ? Limit how much you use to:  0-1 drink a day for women.  0-2 drinks a day for men. ? Be aware of how much alcohol is in your drink. In the [place] one drink equals one 12 oz bottle of beer (355 mL), one 5 oz glass of wine (148 mL), or one 1 oz glass of hard liquor (44 mL). Lifestyle   Work with your health care provider to maintain a healthy body weight or to lose weight. Ask what an ideal weight is for you.  Get at least 30 minutes of exercise  most days of the week. Activities may include walking, swimming, or biking.  Include exercise to strengthen your muscles (resistance exercise), such as Pilates or lifting weights, as part of your weekly exercise routine. Try to do these types of exercises for 30 minutes at least 3 days a week.  Do not use any products that contain nicotine or tobacco, such as cigarettes, e-cigarettes, and chewing tobacco. If you need help quitting, ask your health care provider.  Monitor your blood pressure at home as told by your health care provider.  Keep all follow-up visits as told by your health care provider. This is important. Medicines  Take over-the-counter and prescription medicines only as told by your health care provider. Follow directions carefully. Blood pressure medicines must be taken as prescribed.  Do not skip doses of blood pressure medicine. Doing this puts you at risk for problems and can make the medicine less effective.  Ask your health care provider about side effects or reactions to medicines that you should watch for. Contact  a health care provider if you:  Think you are having a reaction to a medicine you are taking.  Have headaches that keep coming back (recurring).  Feel dizzy.  Have swelling in your ankles.  Have trouble with your vision. Get help right away if you:  Develop a severe headache or confusion.  Have unusual weakness or numbness.  Feel faint.  Have severe pain in your chest or abdomen.  Vomit repeatedly.  Have trouble breathing. Summary  Hypertension is when the force of blood pumping through your arteries is too strong. If this condition is not controlled, it may put you at risk for serious complications.  Your personal target blood pressure may vary depending on your medical conditions, your age, and other factors. For most people, a normal blood pressure is less than 120/80.  Hypertension is treated with lifestyle changes, medicines, or a  combination of both. Lifestyle changes include losing weight, eating a healthy, low-sodium diet, exercising more, and limiting alcohol. This information is not intended to replace advice given to you by your health care provider. Make sure you discuss any questions you have with your health care provider. Document Released: 11/02/2005 Document Revised: 07/13/2018 Document Reviewed: 07/13/2018 Elsevier Patient Education  2020 [employer].

## 2019-05-29 NOTE — Progress Notes (Signed)
Subjective:    Patient ID: Crystal [name], female    DOB: 06-09-1934, 85 y.o.   MRN: [id number]  No chief complaint on file.   HPI Patient is in today for hospital follow up after video assisted bronchoscopy on 7/8 with Dr Roxan Hockey. Thus far her culture is coming  Back negative. No recent fever or new concerns. She admits she is not eating well u stops eating because she has no appetite. Notes some pedal edema but it is some better since returning home. She has no appetite but eats because she knows she should. Her cough is non productive but still precent and her neck pain is better. Denies CP/palp/SOB/HA/congestion/fevers/GI or GU c/o. Taking meds as prescribed  Past Medical History:  Diagnosis Date  . Anemia 08/21/2017  . Anxiety   . Cardiomyopathy    non ischemic NL cos on cath 08/2009, EF to 15-20%, Her follow up  2D echo  in 10/2009 showed impoved EFo 35-40%  . CHF (congestive heart failure) ([place])   . Cough   . Depression   . Dyspnea   . Edema of leg   . Full dentures   . Hypercalcemia 04/20/2017  . Hypertension   . Hypothyroid   . Lung mass   . Medicare annual wellness visit, subsequent 12/14/2014   Follows with Dr Marin Olp Follows with Dr Deatra Ina, gastroenterology, last colonoscopy in 2012, repeat in 2017 Last Pap 2011, always normal, no need for repeat Last MGM in 2011, no concerns, declines for now Follows with Dr Stanford Breed of cardiology   . Non Hodgkin's lymphoma ([place]) 11/17/1999   -Dr. Marin Olp  . Pleural effusion   . Vitamin D deficiency 10/17/2015  . Wears glasses     Past Surgical History:  Procedure Laterality Date  . DILATION AND CURETTAGE OF UTERUS    . MEDIASTINOSCOPY N/A 05/24/2019   Procedure: MEDIASTINOSCOPY;  Surgeon: Melrose Nakayama, MD;  Location: [facility] OR;  Service: Thoracic;  Laterality: N/A;  . MULTIPLE TOOTH EXTRACTIONS    . NASAL POLYP EXCISION  1962  . TONSILLECTOMY  1952  . VIDEO BRONCHOSCOPY WITH ENDOBRONCHIAL NAVIGATION Right 06/29/2018   Procedure: VIDEO BRONCHOSCOPY WITH ENDOBRONCHIAL NAVIGATION;  Surgeon: Collene Gobble, MD;  Location: [place];  Service: Thoracic;  Laterality: Right;  Marland Kitchen VIDEO BRONCHOSCOPY WITH ENDOBRONCHIAL NAVIGATION N/A 05/24/2019   Procedure: VIDEO BRONCHOSCOPY WITH ENDOBRONCHIAL NAVIGATION;  Surgeon: Melrose Nakayama, MD;  Location: [place];  Service: Thoracic;  Laterality: N/A;  . VIDEO BRONCHOSCOPY WITH ENDOBRONCHIAL ULTRASOUND Right 06/29/2018   Procedure: VIDEO BRONCHOSCOPY WITH ENDOBRONCHIAL ULTRASOUND;  Surgeon: Collene Gobble, MD;  Location: [place];  Service: Thoracic;  Laterality: Right;  Marland Kitchen VIDEO BRONCHOSCOPY WITH ENDOBRONCHIAL ULTRASOUND N/A 05/24/2019   Procedure: VIDEO BRONCHOSCOPY WITH ENDOBRONCHIAL ULTRASOUND;  Surgeon: Melrose Nakayama, MD;  Location: [facility] OR;  Service: Thoracic;  Laterality: N/A;    Family History  Problem Relation Age of Onset  . Diabetes Mother        deceased secondary to diabetes  . Hypertension Mother   . Vision loss Mother   . Pneumonia Father        died age 90 due to complications of pneumonia  . Colon polyps Father   . Liver disease Daughter   . Sarcoidosis Daughter   . Diabetes Sister   . Colon cancer Brother 51  . Cancer Brother   . Obesity Son   . Diabetes Brother   . Kidney disease Brother   . Diabetes Sister   .  Sarcoidosis Sister   . Arthritis Sister   . Arthritis Sister   . Diabetes Sister   . Arthritis Sister   . Diabetes Sister   . Hypertension Daughter   . Hypertension Daughter   . Other Other        no obvious premature cardiovascular disease or familial cardiomyopathy is noted  . Alcohol abuse Neg Hx   . Heart disease Neg Hx     Social History   Socioeconomic History  . Marital status: Married    Spouse name: Jeneen Rinks  . Number of children: 5  . Years of education: Not on file  . Highest education level: Not on file  Occupational History  . Occupation: retired  Scientific laboratory technician  . Financial resource strain: Not on file  . Food  insecurity    Worry: Not on file    Inability: Not on file  . Transportation needs    Medical: Not on file    Non-medical: Not on file  Tobacco Use  . Smoking status: Never Smoker  . Smokeless tobacco: Never Used  Substance and Sexual Activity  . Alcohol use: Never    Alcohol/week: 0.0 standard drinks    Frequency: Never  . Drug use: Never  . Sexual activity: Yes    Comment: lives with husband, no dietary restrictions, minimizes dairy  Lifestyle  . Physical activity    Days per week: Not on file    Minutes per session: Not on file  . Stress: Not on file  Relationships  . Social Herbalist on phone: Not on file    Gets together: Not on file    Attends religious service: Not on file    Active member of club or organization: Not on file    Attends meetings of clubs or organizations: Not on file    Relationship status: Not on file  . Intimate partner violence    Fear of current or ex partner: Not on file    Emotionally abused: Not on file    Physically abused: Not on file    Forced sexual activity: Not on file  Other Topics Concern  . Not on file  Social History Narrative   The patient is married ( husband Baljit Liebert)  She just recently celebrated     her 52nd anniversary.  She has five children.  There is no active     tobacco or alcohol use history.          Smoking Status:  never   Caffeine use/day:  None   Does Patient Exercise:  yes    Outpatient Medications Prior to Visit  Medication Sig Dispense Refill  . acetaminophen (TYLENOL) 325 MG tablet Take 2 tablets (650 mg total) by mouth every 6 (six) hours as needed for fever, headache, mild pain or moderate pain.    Marland Kitchen albuterol (VENTOLIN HFA) 108 (90 Base) MCG/ACT inhaler INHALE 1 TO 2 PUFFS BY MOUTH EVERY 4 HOURS AS NEEDED FOR WHEEZING OR SHORTNESS OF BREATH (Patient taking differently: Inhale 1-2 puffs into the lungs every 4 (four) hours as needed for wheezing or shortness of breath. ) 18 g 0  . ALPRAZolam  (XANAX) 0.25 MG tablet Take 1 tablet by mouth twice daily as needed for anxiety (Patient taking differently: Take 0.25 mg by mouth 2 (two) times daily as needed for anxiety. ) 30 tablet 0  . benzonatate (TESSALON) 100 MG capsule TAKE 1 TO 2 CAPSULES BY MOUTH THREE TIMES DAILY AS NEEDED  FOR COUGH (Patient not taking: Reported on 05/22/2019) 60 capsule 0  . furosemide (LASIX) 40 MG tablet Take 1.5 tablets (60 mg total) by mouth daily. May addt'l 64m (1/2 tab) as needed for addt'l swelling in LE (Patient taking differently: Take 40-60 mg by mouth See admin instructions. Take 1 tablet by mouth once daily. May take an addt'l 225m(1/2 tab) as needed for addt'l swelling in LE) 180 tablet 3  . levothyroxine (SYNTHROID) 25 MCG tablet Take 1 tablet (25 mcg total) by mouth daily before breakfast. 90 tablet 0  . magnesium hydroxide (MILK OF MAGNESIA) 400 MG/5ML suspension Take 15 mLs by mouth daily as needed for mild constipation.    . Multiple Vitamins-Minerals (CENTRUM PO) Take 1 tablet by mouth daily.     . Omega-3 Fatty Acids (FISH OIL) 1000 MG CAPS Take 1,000 mg by mouth daily.    . Probiotic Product (PROBIOTIC DAILY PO) Take 1 capsule by mouth daily.     . Marland Kitchenpacer/Aero-Holding Chambers (AEROCHAMBER MV) inhaler Use as instructed 1 each 0  . traMADol (ULTRAM) 50 MG tablet Take 1 tablet (50 mg total) by mouth every 6 (six) hours as needed for moderate pain or severe pain. 25 tablet 0  . carvedilol (COREG) 3.125 MG tablet Take 1 tablet (3.125 mg total) by mouth 2 (two) times daily.     No facility-administered medications prior to visit.     Allergies  Allergen Reactions  . Lisinopril Swelling    Facial and lip swelling  . Iohexol Itching         Review of Systems  Constitutional: Positive for malaise/fatigue. Negative for fever.  HENT: Negative for congestion.   Eyes: Negative for blurred vision.  Respiratory: Positive for cough. Negative for shortness of breath.   Cardiovascular: Negative for  chest pain, palpitations and leg swelling.  Gastrointestinal: Negative for abdominal pain, blood in stool and nausea.  Genitourinary: Negative for dysuria and frequency.  Musculoskeletal: Negative for falls.  Skin: Negative for rash.  Neurological: Negative for dizziness, loss of consciousness and headaches.  Endo/Heme/Allergies: Negative for environmental allergies.  Psychiatric/Behavioral: Negative for depression. The patient is not nervous/anxious.        Objective:    Physical Exam Vitals signs and nursing note reviewed.  Constitutional:      General: She is not in acute distress.    Appearance: She is well-developed.  HENT:     Head: Normocephalic and atraumatic.     Nose: Nose normal.  Eyes:     General:        Right eye: No discharge.        Left eye: No discharge.  Neck:     Musculoskeletal: Normal range of motion and neck supple.     Comments: Healing surgical incision. Lower neck, anterior.no discharge or fluctuance.  Cardiovascular:     Rate and Rhythm: Normal rate and regular rhythm.     Heart sounds: No murmur.  Pulmonary:     Effort: Pulmonary effort is normal.     Breath sounds: Normal breath sounds.     Comments: Decreased BS left lower lobe, no W/R/R Abdominal:     General: Bowel sounds are normal.     Palpations: Abdomen is soft.     Tenderness: There is no abdominal tenderness.  Skin:    General: Skin is warm and dry.  Neurological:     Mental Status: She is alert and oriented to person, place, and time.     BP 90/70 (  BP Location: Left Arm, Patient Position: Sitting, Cuff Size: Normal)   Pulse 83   Temp 98.2 F (36.8 C) (Oral)   Resp 18   Wt 123 lb 12.8 oz (56.2 kg)   SpO2 98%   BMI 23.39 kg/m  Wt Readings from Last 3 Encounters:  05/29/19 123 lb 12.8 oz (56.2 kg)  05/24/19 124 lb 1.9 oz (56.3 kg)  05/22/19 120 lb 11.2 oz (54.7 kg)    Diabetic Foot Exam - Simple   No data filed     Lab Results  Component Value Date   WBC 2.2 (L)  05/29/2019   HGB 11.6 (L) 05/29/2019   HCT 35.7 (L) 05/29/2019   PLT 124.0 (L) 05/29/2019   GLUCOSE 137 (H) 05/29/2019   CHOL 136 05/29/2019   TRIG 65.0 05/29/2019   HDL 51.60 05/29/2019   LDLCALC 72 05/29/2019   ALT 31 05/29/2019   AST 34 05/29/2019   NA 139 05/29/2019   K 3.9 05/29/2019   CL 98 05/29/2019   CREATININE 1.29 (H) 05/29/2019   BUN 33 (H) 05/29/2019   CO2 37 (H) 05/29/2019   TSH 4.97 (H) 05/29/2019   INR 1.3 (H) 05/22/2019   HGBA1C 5.9 05/29/2019    Lab Results  Component Value Date   TSH 4.97 (H) 05/29/2019   Lab Results  Component Value Date   WBC 2.2 (L) 05/29/2019   HGB 11.6 (L) 05/29/2019   HCT 35.7 (L) 05/29/2019   MCV 91.3 05/29/2019   PLT 124.0 (L) 05/29/2019   Lab Results  Component Value Date   NA 139 05/29/2019   K 3.9 05/29/2019   CHLORIDE 101 01/21/2017   CO2 37 (H) 05/29/2019   GLUCOSE 137 (H) 05/29/2019   BUN 33 (H) 05/29/2019   CREATININE 1.29 (H) 05/29/2019   BILITOT 1.1 05/29/2019   ALKPHOS 100 05/29/2019   AST 34 05/29/2019   ALT 31 05/29/2019   PROT 8.0 05/29/2019   ALBUMIN 3.3 (L) 05/29/2019   CALCIUM 9.9 05/29/2019   ANIONGAP 12 05/25/2019   EGFR 29 (L) 01/21/2017   GFR 47.71 (L) 05/29/2019   Lab Results  Component Value Date   CHOL 136 05/29/2019   Lab Results  Component Value Date   HDL 51.60 05/29/2019   Lab Results  Component Value Date   LDLCALC 72 05/29/2019   Lab Results  Component Value Date   TRIG 65.0 05/29/2019   Lab Results  Component Value Date   CHOLHDL 3 05/29/2019   Lab Results  Component Value Date   HGBA1C 5.9 05/29/2019       Assessment & Plan:   Problem List Items Addressed This Visit    Hypothyroidism - Primary    Still under treated will increase Levothyroxine to 50 mcg daily      Relevant Medications   carvedilol (COREG) 3.125 MG tablet   Other Relevant Orders   TSH (Completed)   Thrombocytopenia ([place])   Relevant Orders   CBC with Differential/Platelet (Completed)    Essential hypertension    BP low will drop the Carvedilol 3.125 mg from bid to qd and monitor      Relevant Medications   carvedilol (COREG) 3.125 MG tablet   Other Relevant Orders   Comprehensive metabolic panel (Completed)   Magnesium (Completed)   Diverticulosis   Hyperglycemia    hgba1c acceptable, minimize simple carbs. Increase exercise as tolerated.       Relevant Orders   Hemoglobin A1c (Completed)   Hyperlipidemia, mixed  Encouraged heart healthy diet, increase exercise, avoid trans fats, consider a krill oil cap daily      Relevant Medications   carvedilol (COREG) 3.125 MG tablet   Other Relevant Orders   Lipid panel (Completed)   Vitamin D deficiency    Supplement and monitor      Relevant Orders   VITAMIN D 25 Hydroxy (Vit-D Deficiency, Fractures) (Completed)   Anemia    Some pancytopenia noted after her recent bronchoscopy. She is following with hematology and has an appointment in a month for repeat labs      Cavitating mass in left upper lung lobe    Has had a bronchoscopy and is home and healing well. Culture negative thus far. She will follow up with pulmonology for further concerns.      CKD (chronic kidney disease) stage 3, GFR 30-59 ml/min (HCC)   Relevant Orders   Magnesium (Completed)    Other Visit Diagnoses    NICM (nonischemic cardiomyopathy) ([place])       Relevant Medications   carvedilol (COREG) 3.125 MG tablet   Other Relevant Orders   VITAMIN D 25 Hydroxy (Vit-D Deficiency, Fractures) (Completed)      I have changed Shonika E. Aube's carvedilol. I am also having her maintain her Multiple Vitamins-Minerals (CENTRUM PO), Probiotic Product (PROBIOTIC DAILY PO), Fish Oil, magnesium hydroxide, AeroChamber MV, furosemide, levothyroxine, albuterol, benzonatate, ALPRAZolam, traMADol, and acetaminophen.  Meds ordered this encounter  Medications  . carvedilol (COREG) 3.125 MG tablet    Sig: Take 1 tablet (3.125 mg total) by mouth every  morning.     Penni Homans, MD

## 2019-05-29 NOTE — Assessment & Plan Note (Signed)
Has had a bronchoscopy and is home and healing well. Culture negative thus far. She will follow up with pulmonology for further concerns.

## 2019-05-29 NOTE — Assessment & Plan Note (Signed)
BP low will drop the Carvedilol 3.125 mg from bid to qd and monitor

## 2019-05-29 NOTE — Assessment & Plan Note (Signed)
Some pancytopenia noted after her recent bronchoscopy. She is following with hematology and has an appointment in a month for repeat labs

## 2019-05-29 NOTE — Assessment & Plan Note (Signed)
Is on O2 from [place] now at 2L

## 2019-05-30 ENCOUNTER — Encounter: Payer: Self-pay | Admitting: Thoracic Surgery (Cardiothoracic Vascular Surgery)

## 2019-05-30 ENCOUNTER — Ambulatory Visit (INDEPENDENT_AMBULATORY_CARE_PROVIDER_SITE_OTHER): Payer: Medicare Other | Admitting: Thoracic Surgery (Cardiothoracic Vascular Surgery)

## 2019-05-30 VITALS — BP 129/78 | HR 86 | Temp 94.1°F | Resp 18 | Ht 61.0 in | Wt 125.0 lb

## 2019-05-30 DIAGNOSIS — R918 Other nonspecific abnormal finding of lung field: Secondary | ICD-10-CM

## 2019-05-30 DIAGNOSIS — Z9889 Other specified postprocedural states: Secondary | ICD-10-CM

## 2019-05-30 DIAGNOSIS — R590 Localized enlarged lymph nodes: Secondary | ICD-10-CM | POA: Diagnosis not present

## 2019-05-30 NOTE — Progress Notes (Signed)
[address]       [place] [address]             [phone]    HPI: Crystal [name] returns for a scheduled follow-up visit  Crystal [name] is an 85 year old woman with a history of non-Hodgkin's lymphoma, nonischemic cardiomyopathy, chronic systolic left heart failure, hypertension, hypothyroidism, anxiety, depression, and anemia.  She has had a left upper lobe cavitary mass and bilateral hilar mediastinal adenopathy dating back to July 2018.  She had bronchoscopy and endobronchial ultrasound at one point but samples were nondiagnostic.  In January 2020 she had a near syncopal episode.  She was in decompensated heart failure.  CT showed the left upper lobe mass and adenopathy were unchanged.  She was referred for possible mediastinoscopy.  Fungal antibody and QuantiFERON gold testing were negative.  Biopsy was deferred due to the COVID-19 situation.  I did a bronchoscopy, endobronchial ultrasound, and mediastinoscopy last week.  Postoperatively she was slow to wake up from anesthesia was having some respiratory difficulty.  We kept her for overnight observation.  She required BiPAP overnight, but by the next morning her chest x-ray was improved and she was able to go home.  She has been on oxygen at home.  She is not having significant incisional pain.  She has not had any difficulties since discharge. Past Medical History:  Diagnosis Date  . Anemia 08/21/2017  . Anxiety   . Cardiomyopathy    non ischemic NL cos on cath 08/2009, EF to 15-20%, Her follow up  2D echo  in 10/2009 showed impoved EFo 35-40%  . CHF (congestive heart failure) ([facility])   . Cough   . Depression   . Dyspnea   . Edema of leg   . Full dentures   . Hypercalcemia 04/20/2017  . Hypertension   . Hypothyroid   . Lung mass   . Medicare annual wellness visit, subsequent 12/14/2014   Follows with Dr Marin Olp Follows with Dr Deatra Ina, gastroenterology, last colonoscopy in 2012, repeat in 2017 Last Pap 2011,  always normal, no need for repeat Last MGM in 2011, no concerns, declines for now Follows with Dr Stanford Breed of cardiology   . Non Hodgkin's lymphoma ([place]) 11/17/1999   -Dr. Marin Olp  . Pleural effusion   . Vitamin D deficiency 10/17/2015  . Wears glasses     Current Outpatient Medications  Medication Sig Dispense Refill  . acetaminophen (TYLENOL) 325 MG tablet Take 2 tablets (650 mg total) by mouth every 6 (six) hours as needed for fever, headache, mild pain or moderate pain.    Marland Kitchen albuterol (VENTOLIN HFA) 108 (90 Base) MCG/ACT inhaler INHALE 1 TO 2 PUFFS BY MOUTH EVERY 4 HOURS AS NEEDED FOR WHEEZING OR SHORTNESS OF BREATH (Patient taking differently: Inhale 1-2 puffs into the lungs every 4 (four) hours as needed for wheezing or shortness of breath. ) 18 g 0  . ALPRAZolam (XANAX) 0.25 MG tablet Take 1 tablet by mouth twice daily as needed for anxiety (Patient taking differently: Take 0.25 mg by mouth 2 (two) times daily as needed for anxiety. ) 30 tablet 0  . benzonatate (TESSALON) 100 MG capsule TAKE 1 TO 2 CAPSULES BY MOUTH THREE TIMES DAILY AS NEEDED FOR COUGH 60 capsule 0  . carvedilol (COREG) 3.125 MG tablet Take 1 tablet (3.125 mg total) by mouth every morning.    . furosemide (LASIX) 40 MG tablet Take 1.5 tablets (60 mg total) by mouth daily. May addt'l  20mg  (1/2 tab) as needed for addt'l swelling in LE (Patient taking differently: Take 40-60 mg by mouth See admin instructions. Take 1 tablet by mouth once daily. May take an addt'l 20mg  (1/2 tab) as needed for addt'l swelling in LE) 180 tablet 3  . levothyroxine (SYNTHROID) 25 MCG tablet Take 1 tablet (25 mcg total) by mouth daily before breakfast. 90 tablet 0  . magnesium hydroxide (MILK OF MAGNESIA) 400 MG/5ML suspension Take 15 mLs by mouth daily as needed for mild constipation.    . Multiple Vitamins-Minerals (CENTRUM PO) Take 1 tablet by mouth daily.     . Omega-3 Fatty Acids (FISH OIL) 1000 MG CAPS Take 1,000 mg by mouth daily.    .  Probiotic Product (PROBIOTIC DAILY PO) Take 1 capsule by mouth daily.     Marland Kitchen Spacer/Aero-Holding Chambers (AEROCHAMBER MV) inhaler Use as instructed 1 each 0  . traMADol (ULTRAM) 50 MG tablet Take 1 tablet (50 mg total) by mouth every 6 (six) hours as needed for moderate pain or severe pain. 25 tablet 0   No current facility-administered medications for this visit.     Physical Exam BP 129/78 (BP Location: Left Arm, Patient Position: Sitting, Cuff Size: Normal)   Pulse 86   Temp (!) 94.1 F (34.5 C)   Resp 18   Ht 5\' 1"  (1.549 m)   Wt 125 lb (56.7 kg)   SpO2 99% Comment: 2LNC  BMI 23.34 kg/m  85 year old woman in no acute distress Wearing surgical mask Nasal cannula oxygen in place Lungs with faint wheezing bilaterally Cardiac regular rate and rhythm  Diagnostic Tests: Pathology 1. Lymph node, biopsy, Level 2R - NECROTIZING GRANULOMATA. 2. Lung, biopsy, Left Upper Lobe - BRONCHIAL WALL WITH NONSPECIFIC INFLAMMATION AND ORGANIZING HEMORRHAGE. 3. Lymph node, biopsy, Level 2R #2 - NECROTIZING GRANULOMATA. 4. Lymph node, biopsy, Level 4R - NECROTIZING GRANULOMATA. 5. Lymph node, biopsy, Level 7 - NECROTIZING GRANULOMATA. 6. Lymph node, biopsy, Level 4R #2 - NECROTIZING GRANULOMATA.  Impression: Crystal [name] is an 85 year old woman with a past medical history of non-Hodgkin's lymphoma, nonischemic cardiomyopathy, chronic systolic left heart failure, hypertension, hypothyroidism, anxiety, depression, and anemia.  She was found to have a cavitary left apical mass and mediastinal and bilateral hilar adenopathy about 2 years ago.  Bronchoscopy and endobronchial ultrasound were nondiagnostic.  QuantiFERON gold and fungal antigen testing was negative as well.  I did bronchoscopy, endobronchial ultrasound, and mediastinal [place] last week.  Both the bronchoscopy and endobronchial ultrasound were again nondiagnostic.  Mediastinoscopy however revealed necrotizing granulomas and all of the  mediastinal lymph nodes.  AFB and fungal stains were negative.  Cultures are pending.  She is recovering well from mediastinitis to me.  Her incision is healing nicely.  She has minimal discomfort.  There are no restrictions on her activities.  She did require admission and support with BiPAP overnight after her surgery.  She went home on oxygen.  She can remain on that until she sees Dr. Lamonte Sakai in August.  The next step is for her to follow-up with Dr. Lamonte Sakai as scheduled in August.  That appointment is only about 2 weeks away.  He can then determine how to proceed in terms of treatment.  Plan: Follow-up with Dr. Lamonte Sakai as scheduled on 06/19/2019  Melrose Nakayama, MD [facility] [phone]

## 2019-05-31 MED ORDER — LEVOTHYROXINE SODIUM 50 MCG PO TABS: 50.0000 ug | ORAL_TABLET | Freq: Every day | ORAL | 3 refills | Status: DC

## 2019-05-31 NOTE — Addendum Note (Signed)
Addended by: Magdalene Molly A on: 05/31/2019 03:13 PM   Modules accepted: Orders

## 2019-06-04 ENCOUNTER — Other Ambulatory Visit: Payer: Self-pay | Admitting: Family Medicine

## 2019-06-13 ENCOUNTER — Ambulatory Visit: Payer: Medicare Other | Admitting: Emergency Medicine

## 2019-06-14 LAB — CULTURE, FUNGUS WITHOUT SMEAR

## 2019-06-19 ENCOUNTER — Other Ambulatory Visit: Payer: Self-pay

## 2019-06-19 ENCOUNTER — Encounter: Payer: Self-pay | Admitting: Emergency Medicine

## 2019-06-19 ENCOUNTER — Ambulatory Visit (INDEPENDENT_AMBULATORY_CARE_PROVIDER_SITE_OTHER): Payer: Medicare Other | Admitting: Emergency Medicine

## 2019-06-19 DIAGNOSIS — J984 Other disorders of lung: Secondary | ICD-10-CM

## 2019-06-19 MED ORDER — PREDNISONE 10 MG PO TABS: ORAL_TABLET | ORAL | 0 refills | Status: DC

## 2019-06-19 NOTE — Progress Notes (Signed)
Subjective:    Patient ID: Crystal [name], female    DOB: [birth year], 85 y.o.   MRN: [id number]  HPI Crystal [name] is an 85 year old never smoker who has been followed by Dr. Marin Olp for diffuse large cell non-Hodgkin's lymphoma, treated remotely and in remission.  She also has nonischemic cardiomyopathy (EF 20 to 25%), hypertension, hypothyroidism.  She is referred today for evaluation of an abnormal CT scan of the chest and abnormal PET scan.  She tells me she has been well until she developed neck pain just over a month ago. She was evaluated at [facility] where a CT neck 05/24/18 showed a LUL cavitary mass. This prompted a CT chest 05/24/18 and then a PET 06/06/18 both of which I have personally reviewed. The mass and the bilateral hilar nodes are hypermetabolic.   She denies any CP, cough, dyspnea. She has had some dizziness at times, often associated with her Imdur. No sputum, no hemoptysis. She has not had any edema, so she has not been using her prn lasix.   She remains active, participates and hiking club, participates in [employer].  ROV 06/19/2019 --follow-up visit for 85 year old never smoker with history of coronary disease/CABG, non-Hodgkin's lymphoma, CKD stage III.  I been following her for a left upper lobe mass with some area of cavitation, she underwent bronchoscopy 06/29/2018 that was negative for malignancy.  Her CT scan has been followed in February and then again in May 2020 and I have reviewed.  No significant change compared with prior although she does now have more prominent pulmonary effusions.  She underwent endobronchial ultrasound and then mediastinal anoscopy with Dr. Roxan Hockey 7/8 with multiple nodes consistent with granulomatous inflammation, fungal cultures negative, AFB cultures negative.  Results are suggestive of sarcoidosis. She states that her breathing has improved. She feels that she is benefiting from O2 (since the procedure).    Review of Systems  Constitutional:  Negative for fever and unexpected weight change.  HENT: Negative for congestion, dental problem, ear pain, nosebleeds, postnasal drip, rhinorrhea, sinus pressure, sneezing, sore throat and trouble swallowing.   Eyes: Negative for redness and itching.  Respiratory: Negative for cough, chest tightness, shortness of breath and wheezing.   Cardiovascular: Negative for palpitations and leg swelling.  Gastrointestinal: Negative for nausea and vomiting.  Genitourinary: Negative for dysuria.  Musculoskeletal: Negative for joint swelling.  Skin: Negative for rash.  Allergic/Immunologic: Negative.  Negative for environmental allergies, food allergies and immunocompromised state.  Neurological: Negative for headaches.  Hematological: Does not bruise/bleed easily.  Psychiatric/Behavioral: Negative for dysphoric mood. The patient is not nervous/anxious.    Past Medical History:  Diagnosis Date   Anemia 08/21/2017   Anxiety    Cardiomyopathy    non ischemic NL cos on cath 08/2009, EF to 15-20%, Her follow up  2D echo  in 10/2009 showed impoved EFo 35-40%   CHF (congestive heart failure) ([facility])    Cough    Depression    Dyspnea    Edema of leg    Full dentures    Hypercalcemia 04/20/2017   Hypertension    Hypothyroid    Lung mass    Medicare annual wellness visit, subsequent 12/14/2014   Follows with Dr Marin Olp Follows with Dr Deatra Ina, gastroenterology, last colonoscopy in 2012, repeat in 2017 Last Pap 2011, always normal, no need for repeat Last MGM in 2011, no concerns, declines for now Follows with Dr Stanford Breed of cardiology    Non Hodgkin's lymphoma ([place]) 11/17/1999   -Dr.  Ennever   Pleural effusion    Vitamin D deficiency 10/17/2015   Wears glasses      Family History  Problem Relation Age of Onset   Diabetes Mother        deceased secondary to diabetes   Hypertension Mother    Vision loss Mother    Pneumonia Father        died age 57 due to complications of  pneumonia   Colon polyps Father    Liver disease Daughter    Sarcoidosis Daughter    Diabetes Sister    Colon cancer Brother 71   Cancer Brother    Obesity Son    Diabetes Brother    Kidney disease Brother    Diabetes Sister    Sarcoidosis Sister    Arthritis Sister    Arthritis Sister    Diabetes Sister    Arthritis Sister    Diabetes Sister    Hypertension Daughter    Hypertension Daughter    Other Other        no obvious premature cardiovascular disease or familial cardiomyopathy is noted   Alcohol abuse Neg Hx    Heart disease Neg Hx      Social History   Socioeconomic History   Marital status: Married    Spouse name: Jeneen Rinks   Number of children: 5   Years of education: Not on file   Highest education level: Not on file  Occupational History   Occupation: retired  Scientist, product/process development strain: Not on file   Food insecurity    Worry: Not on file    Inability: Not on Lexicographer needs    Medical: Not on file    Non-medical: Not on file  Tobacco Use   Smoking status: Never Smoker   Smokeless tobacco: Never Used  Substance and Sexual Activity   Alcohol use: Never    Alcohol/week: 0.0 standard drinks    Frequency: Never   Drug use: Never   Sexual activity: Yes    Comment: lives with husband, no dietary restrictions, minimizes dairy  Lifestyle   Physical activity    Days per week: Not on file    Minutes per session: Not on file   Stress: Not on file  Relationships   Social connections    Talks on phone: Not on file    Gets together: Not on file    Attends religious service: Not on file    Active member of club or organization: Not on file    Attends meetings of clubs or organizations: Not on file    Relationship status: Not on file   Intimate partner violence    Fear of current or ex partner: Not on file    Emotionally abused: Not on file    Physically abused: Not on file    Forced sexual  activity: Not on file  Other Topics Concern   Not on file  Social History Narrative   The patient is married ( husband Deneen Slager)  She just recently celebrated     her 52nd anniversary.  She has five children.  There is no active     tobacco or alcohol use history.          Smoking Status:  never   Caffeine use/day:  None   Does Patient Exercise:  yes     Allergies  Allergen Reactions   Lisinopril Swelling    Facial and lip swelling   Iohexol Itching  Outpatient Medications Prior to Visit  Medication Sig Dispense Refill   acetaminophen (TYLENOL) 325 MG tablet Take 2 tablets (650 mg total) by mouth every 6 (six) hours as needed for fever, headache, mild pain or moderate pain.     albuterol (VENTOLIN HFA) 108 (90 Base) MCG/ACT inhaler INHALE 1 TO 2 PUFFS BY MOUTH EVERY 4 HOURS AS NEEDED FOR WHEEZING OR SHORTNESS OF BREATH (Patient taking differently: Inhale 1-2 puffs into the lungs every 4 (four) hours as needed for wheezing or shortness of breath. ) 18 g 0   ALPRAZolam (XANAX) 0.25 MG tablet Take 1 tablet by mouth twice daily as needed for anxiety (Patient taking differently: Take 0.25 mg by mouth 2 (two) times daily as needed for anxiety. ) 30 tablet 0   benzonatate (TESSALON) 100 MG capsule TAKE 1 TO 2 CAPSULES BY MOUTH THREE TIMES DAILY AS NEEDED FOR COUGH 60 capsule 0   carvedilol (COREG) 3.125 MG tablet Take 1 tablet (3.125 mg total) by mouth every morning.     levothyroxine (SYNTHROID) 50 MCG tablet Take 1 tablet (50 mcg total) by mouth daily. 90 tablet 3   magnesium hydroxide (MILK OF MAGNESIA) 400 MG/5ML suspension Take 15 mLs by mouth daily as needed for mild constipation.     Multiple Vitamins-Minerals (CENTRUM PO) Take 1 tablet by mouth daily.      Omega-3 Fatty Acids (FISH OIL) 1000 MG CAPS Take 1,000 mg by mouth daily.     Probiotic Product (PROBIOTIC DAILY PO) Take 1 capsule by mouth daily.      Spacer/Aero-Holding Chambers (AEROCHAMBER MV)  inhaler Use as instructed 1 each 0   traMADol (ULTRAM) 50 MG tablet Take 1 tablet (50 mg total) by mouth every 6 (six) hours as needed for moderate pain or severe pain. 25 tablet 0   furosemide (LASIX) 40 MG tablet Take 1.5 tablets (60 mg total) by mouth daily. May addt'l 20mg  (1/2 tab) as needed for addt'l swelling in LE (Patient taking differently: Take 40-60 mg by mouth See admin instructions. Take 1 tablet by mouth once daily. May take an addt'l 20mg  (1/2 tab) as needed for addt'l swelling in LE) 180 tablet 3   No facility-administered medications prior to visit.         Objective:   Physical Exam  Vitals:   06/19/19 1514  BP: 102/68  Pulse: 66  SpO2: 99%  Weight: 116 lb (52.6 kg)  Height: 5\' 3"  (1.6 m)   Gen: Pleasant, well-nourished, in no distress,  normal affect, in a wheelchair  ENT: No lesions,  mouth clear,  oropharynx clear, no postnasal drip  Neck: No JVD, no stridor  Lungs: No use of accessory muscles, clear B   Cardiovascular: RRR, heart sounds normal, no murmur or gallops, no peripheral edema  Musculoskeletal: No deformities, no cyanosis or clubbing  Neuro: alert, non focal  Skin: Warm, no lesions or rashes       Assessment & Plan:  Cavitating mass in left upper lung lobe The mediastinoscopy and culture data are all suggestive of sarcoidosis.  This could explain the left upper lobe cavitary mass, pleural effusions.  I think will be reasonable to try a 3-week course of moderate dose prednisone to see if we can impact both.  I reviewed the potential side effects with her today.  She knows to call me if she has any difficulty.  We will repeat a chest x-ray at her next visit in 1 month, plan the timing of a repeat CT  scan at that time.  She may benefit from pulmonary function testing, is at some risk for obstructive lung disease from sarcoid.  Baltazar Apo, MD, PhD 06/19/2019, 3:52 PM [facility] [phone] or if no answer [phone]

## 2019-06-19 NOTE — Addendum Note (Signed)
Addended by: Desmond Dike C on: 06/19/2019 03:58 PM   Modules accepted: Orders

## 2019-06-19 NOTE — Assessment & Plan Note (Signed)
The mediastinoscopy and culture data are all suggestive of sarcoidosis.  This could explain the left upper lobe cavitary mass, pleural effusions.  I think will be reasonable to try a 3-week course of moderate dose prednisone to see if we can impact both.  I reviewed the potential side effects with her today.  She knows to call me if she has any difficulty.  We will repeat a chest x-ray at her next visit in 1 month, plan the timing of a repeat CT scan at that time.  She may benefit from pulmonary function testing, is at some risk for obstructive lung disease from sarcoid.

## 2019-06-19 NOTE — Patient Instructions (Signed)
We will start prednisone 20 mg once daily.  Please take this for the next 3 weeks.  At that time we will decrease to 10 mg for 4 days and then stop completely. Follow-up with Dr. Lamonte Sakai in 1 month with a chest x-ray on the same day. We may decide to perform pulmonary function testing at some point in the future depending on how your breathing is progressing.

## 2019-06-22 LAB — FUNGUS CULTURE WITH STAIN

## 2019-06-22 LAB — FUNGUS CULTURE RESULT

## 2019-06-22 LAB — FUNGAL ORGANISM REFLEX

## 2019-06-26 ENCOUNTER — Other Ambulatory Visit: Payer: Self-pay

## 2019-06-26 ENCOUNTER — Ambulatory Visit: Payer: Medicare Other | Admitting: Family Medicine

## 2019-06-26 ENCOUNTER — Ambulatory Visit (INDEPENDENT_AMBULATORY_CARE_PROVIDER_SITE_OTHER): Payer: Medicare Other | Admitting: Family Medicine

## 2019-06-26 DIAGNOSIS — D696 Thrombocytopenia, unspecified: Secondary | ICD-10-CM

## 2019-06-26 DIAGNOSIS — I10 Essential (primary) hypertension: Secondary | ICD-10-CM

## 2019-06-26 DIAGNOSIS — E782 Mixed hyperlipidemia: Secondary | ICD-10-CM | POA: Diagnosis not present

## 2019-06-26 DIAGNOSIS — R739 Hyperglycemia, unspecified: Secondary | ICD-10-CM

## 2019-06-26 DIAGNOSIS — R590 Localized enlarged lymph nodes: Secondary | ICD-10-CM

## 2019-06-26 DIAGNOSIS — E559 Vitamin D deficiency, unspecified: Secondary | ICD-10-CM | POA: Diagnosis not present

## 2019-06-26 DIAGNOSIS — E039 Hypothyroidism, unspecified: Secondary | ICD-10-CM | POA: Diagnosis not present

## 2019-06-26 NOTE — Assessment & Plan Note (Signed)
Supplement and monitor 

## 2019-06-26 NOTE — Assessment & Plan Note (Signed)
Check vitals weekly no changes to meds. Encouraged heart healthy diet such as the DASH diet and exercise as tolerated.  

## 2019-06-28 ENCOUNTER — Other Ambulatory Visit: Payer: Self-pay

## 2019-06-28 ENCOUNTER — Telehealth: Payer: Self-pay | Admitting: Hematology & Oncology

## 2019-06-28 ENCOUNTER — Inpatient Hospital Stay: Payer: Medicare Other | Attending: Hematology & Oncology

## 2019-06-28 ENCOUNTER — Inpatient Hospital Stay (HOSPITAL_BASED_OUTPATIENT_CLINIC_OR_DEPARTMENT_OTHER): Payer: Medicare Other | Admitting: Hematology & Oncology

## 2019-06-28 ENCOUNTER — Encounter: Payer: Self-pay | Admitting: Hematology & Oncology

## 2019-06-28 VITALS — BP 108/76 | HR 80 | Temp 96.9°F | Resp 19 | Wt 119.0 lb

## 2019-06-28 DIAGNOSIS — D869 Sarcoidosis, unspecified: Secondary | ICD-10-CM | POA: Insufficient documentation

## 2019-06-28 DIAGNOSIS — C8334 Diffuse large B-cell lymphoma, lymph nodes of axilla and upper limb: Secondary | ICD-10-CM

## 2019-06-28 DIAGNOSIS — R918 Other nonspecific abnormal finding of lung field: Secondary | ICD-10-CM | POA: Insufficient documentation

## 2019-06-28 DIAGNOSIS — C8331 Diffuse large B-cell lymphoma, lymph nodes of head, face, and neck: Secondary | ICD-10-CM

## 2019-06-28 DIAGNOSIS — Z8572 Personal history of non-Hodgkin lymphomas: Secondary | ICD-10-CM | POA: Diagnosis not present

## 2019-06-28 LAB — CMP (CANCER CENTER ONLY)
ALT: 55 U/L — ABNORMAL HIGH (ref 0–44)
AST: 31 U/L (ref 15–41)
Albumin: 3.5 g/dL (ref 3.5–5.0)
Alkaline Phosphatase: 102 U/L (ref 38–126)
Anion gap: 6 (ref 5–15)
BUN: 38 mg/dL — ABNORMAL HIGH (ref 8–23)
CO2: 36 mmol/L — ABNORMAL HIGH (ref 22–32)
Calcium: 10.5 mg/dL — ABNORMAL HIGH (ref 8.9–10.3)
Chloride: 97 mmol/L — ABNORMAL LOW (ref 98–111)
Creatinine: 1.33 mg/dL — ABNORMAL HIGH (ref 0.44–1.00)
GFR, Est AFR Am: 43 mL/min — ABNORMAL LOW (ref >60)
GFR, Estimated: 37 mL/min — ABNORMAL LOW (ref >60)
Glucose, Bld: 143 mg/dL — ABNORMAL HIGH (ref 70–99)
Potassium: 4.1 mmol/L (ref 3.5–5.1)
Sodium: 139 mmol/L (ref 135–145)
Total Bilirubin: 0.8 mg/dL (ref 0.3–1.2)
Total Protein: 8.2 g/dL — ABNORMAL HIGH (ref 6.5–8.1)

## 2019-06-28 LAB — LACTATE DEHYDROGENASE: LDH: 235 U/L — ABNORMAL HIGH (ref 98–192)

## 2019-06-28 LAB — SEDIMENTATION RATE: Sed Rate: 62 mm/hr — ABNORMAL HIGH (ref 0–22)

## 2019-06-28 LAB — CBC WITH DIFFERENTIAL (CANCER CENTER ONLY)
Abs Immature Granulocytes: 0.02 10*3/uL (ref 0.00–0.07)
Basophils Absolute: 0 10*3/uL (ref 0.0–0.1)
Basophils Relative: 0 %
Eosinophils Absolute: 0 10*3/uL (ref 0.0–0.5)
Eosinophils Relative: 0 %
HCT: 38 % (ref 36.0–46.0)
Hemoglobin: 11.7 g/dL — ABNORMAL LOW (ref 12.0–15.0)
Immature Granulocytes: 0 %
Lymphocytes Relative: 14 %
Lymphs Abs: 0.7 10*3/uL (ref 0.7–4.0)
MCH: 29 pg (ref 26.0–34.0)
MCHC: 30.8 g/dL (ref 30.0–36.0)
MCV: 94.1 fL (ref 80.0–100.0)
Monocytes Absolute: 0.4 10*3/uL (ref 0.1–1.0)
Monocytes Relative: 8 %
Neutro Abs: 3.9 10*3/uL (ref 1.7–7.7)
Neutrophils Relative %: 78 %
Platelet Count: 149 10*3/uL — ABNORMAL LOW (ref 150–400)
RBC: 4.04 MIL/uL (ref 3.87–5.11)
RDW: 14.5 % (ref 11.5–15.5)
WBC Count: 5.1 10*3/uL (ref 4.0–10.5)
nRBC: 0 % (ref 0.0–0.2)

## 2019-06-28 LAB — SAVE SMEAR(SSMR), FOR PROVIDER SLIDE REVIEW

## 2019-06-28 NOTE — Assessment & Plan Note (Signed)
Biopsies c/w sarcoidosis and patient established with pulmonology, she will continue O2 supplementation for now.

## 2019-06-28 NOTE — Assessment & Plan Note (Signed)
Encouraged heart healthy diet, increase exercise, avoid trans fats, consider a krill oil cap daily 

## 2019-06-28 NOTE — Telephone Encounter (Signed)
Called and LMVM for patient with date/time of follow up appointment/ letter/calendar mailed per 8/12 los

## 2019-06-28 NOTE — Assessment & Plan Note (Signed)
On Levothyroxine, continue to monitor 

## 2019-06-28 NOTE — Progress Notes (Signed)
Hematology and Oncology Follow Up Visit  Crystal [name] [id number] [birth year] 85 y.o. 06/28/2019   Principle Diagnosis:  Diffuse large cell non-Hodgkin's lymphoma-remission LEFT Lung mass -- Sarcoidosis  Current Therapy:   Observation   Interim History:  Crystal [name] is here today for follow-up.  She was finally able to have her mediastinoscopy.  This was done by Dr. Roxan Hockey.  Thankfully, the biopsies did not show any lung cancer or lymphoma.  The pathology report read disease [id number]) showed all lymph nodes to have necrotizing granulomatous disease.  I suspect this probably is sarcoidosis.  She has had that the daughter of hers died of sarcoid in her 64s.  She on oxygen right now.  This happened after she had her mediastinoscopy.  Hopefully, there will be a time where she will be able to come off the oxygen.  She is still incredibly strong with her faith.  We had good fellowship today.  She has had no problems with bleeding.  There is been no nausea or vomiting.  She has had no change in bowel or bladder habits.  Overall, her performance status is ECOG 2.     Medications:  Allergies as of 06/28/2019      Reactions   Lisinopril Swelling   Facial and lip swelling   Iohexol Itching         Medication List       Accurate as of June 28, 2019  1:32 PM. If you have any questions, ask your nurse or doctor.        acetaminophen 325 MG tablet Commonly known as: TYLENOL Take 2 tablets (650 mg total) by mouth every 6 (six) hours as needed for fever, headache, mild pain or moderate pain.   AeroChamber MV inhaler Use as instructed   albuterol 108 (90 Base) MCG/ACT inhaler Commonly known as: VENTOLIN HFA INHALE 1 TO 2 PUFFS BY MOUTH EVERY 4 HOURS AS NEEDED FOR WHEEZING OR SHORTNESS OF BREATH What changed: See the new instructions.   ALPRAZolam 0.25 MG tablet Commonly known as: XANAX Take 1 tablet by mouth twice daily as needed for anxiety What changed:   reasons to take  this  additional instructions   benzonatate 100 MG capsule Commonly known as: TESSALON TAKE 1 TO 2 CAPSULES BY MOUTH THREE TIMES DAILY AS NEEDED FOR COUGH   carvedilol 3.125 MG tablet Commonly known as: COREG Take 1 tablet (3.125 mg total) by mouth every morning.   CENTRUM PO Take 1 tablet by mouth daily.   Fish Oil 1000 MG Caps Take 1,000 mg by mouth daily.   furosemide 40 MG tablet Commonly known as: LASIX Take 1.5 tablets (60 mg total) by mouth daily. May addt'l 20mg  (1/2 tab) as needed for addt'l swelling in LE What changed:   how much to take  when to take this  additional instructions   levothyroxine 50 MCG tablet Commonly known as: SYNTHROID Take 1 tablet (50 mcg total) by mouth daily.   magnesium hydroxide 400 MG/5ML suspension Commonly known as: MILK OF MAGNESIA Take 15 mLs by mouth daily as needed for mild constipation.   predniSONE 10 MG tablet Commonly known as: DELTASONE Take 2 tablets daily for 3 weeks, then decrease to 1 tablet for 4 days then stop   PROBIOTIC DAILY PO Take 1 capsule by mouth daily.   traMADol 50 MG tablet Commonly known as: ULTRAM Take 1 tablet (50 mg total) by mouth every 6 (six) hours as needed for moderate pain or severe pain.  Allergies:  Allergies  Allergen Reactions  . Lisinopril Swelling    Facial and lip swelling  . Iohexol Itching         Past Medical History, Surgical history, Social history, and Family History were reviewed and updated.  Review of Systems: Review of Systems  Constitutional: Positive for malaise/fatigue.  HENT: Negative.   Eyes: Negative.   Respiratory: Positive for shortness of breath and wheezing.   Cardiovascular: Positive for chest pain.  Gastrointestinal: Negative.   Genitourinary: Negative.   Musculoskeletal: Positive for myalgias.  Skin: Negative.   Neurological: Negative.   Endo/Heme/Allergies: Negative.   Psychiatric/Behavioral: Negative.       Physical Exam:   weight is 119 lb (54 kg). Her oral temperature is 96.9 F (36.1 C) (abnormal). Her blood pressure is 108/76 and her pulse is 80. Her respiration is 19 and oxygen saturation is 100%.   Wt Readings from Last 3 Encounters:  06/28/19 119 lb (54 kg)  06/19/19 116 lb (52.6 kg)  05/30/19 125 lb (56.7 kg)    Physical Exam Vitals signs reviewed.  HENT:     Head: Normocephalic and atraumatic.  Eyes:     Pupils: Pupils are equal, round, and reactive to light.  Neck:     Musculoskeletal: Normal range of motion.  Cardiovascular:     Rate and Rhythm: Normal rate and regular rhythm.     Heart sounds: Normal heart sounds.     Comments: Card exam is regular rate and rhythm.  There may be some increase in rate.  There is no murmurs, rubs or bruits. Pulmonary:     Effort: Pulmonary effort is normal.     Breath sounds: Normal breath sounds.     Comments: Lung exam shows decent breath sounds bilaterally.  There is occasional wheezing.  There might be some slight decrease of breath sounds over on the left lung. Abdominal:     General: Bowel sounds are normal.     Palpations: Abdomen is soft.  Musculoskeletal: Normal range of motion.        General: No tenderness or deformity.  Lymphadenopathy:     Cervical: No cervical adenopathy.  Skin:    General: Skin is warm and dry.     Findings: No erythema or rash.  Neurological:     Mental Status: She is alert and oriented to person, place, and time.  Psychiatric:        Behavior: Behavior normal.        Thought Content: Thought content normal.        Judgment: Judgment normal.      Lab Results  Component Value Date   WBC 5.1 06/28/2019   HGB 11.7 (L) 06/28/2019   HCT 38.0 06/28/2019   MCV 94.1 06/28/2019   PLT 149 (L) 06/28/2019   No results found for: FERRITIN, IRON, TIBC, UIBC, IRONPCTSAT Lab Results  Component Value Date   RETICCTPCT 1.1 10/15/2011   RBC 4.04 06/28/2019   RETICCTABS 49.7 10/15/2011   No results found for:  KPAFRELGTCHN, LAMBDASER, KAPLAMBRATIO No results found for: IGGSERUM, IGA, IGMSERUM No results found for: TOTALPROTELP, ALBUMINELP, A1GS, A2GS, BETS, BETA2SER, GAMS, MSPIKE, SPEI   Chemistry      Component Value Date/Time   NA 139 06/28/2019 1126   NA 136 03/28/2019 1004   NA 138 01/21/2017 1021   K 4.1 06/28/2019 1126   K 5.1 01/21/2017 1021   CL 97 (L) 06/28/2019 1126   CO2 36 (H) 06/28/2019 1126   CO2 30 (  H) 01/21/2017 1021   BUN 38 (H) 06/28/2019 1126   BUN 24 03/28/2019 1004   BUN 30.0 (H) 01/21/2017 1021   CREATININE 1.33 (H) 06/28/2019 1126   CREATININE 1.8 (H) 01/21/2017 1021      Component Value Date/Time   CALCIUM 10.5 (H) 06/28/2019 1126   CALCIUM 11.1 (H) 01/21/2017 1021   ALKPHOS 102 06/28/2019 1126   ALKPHOS 124 01/21/2017 1021   AST 31 06/28/2019 1126   AST 27 01/21/2017 1021   ALT 55 (H) 06/28/2019 1126   ALT 25 01/21/2017 1021   BILITOT 0.8 06/28/2019 1126   BILITOT 0.50 01/21/2017 1021       Impression and Plan: Crystal [name] is a very pleasant 85 yo African American female with history of diffuse large cell non-Hodgkin lymphoma diagnosed and treated over 18 years ago. So far there has been no evidence of recurrence.   I am grateful for the fact that her mediastinoscopy came back with sarcoidosis.  She is being seen by pulmonary medicine for this.  For right now, I will plan to get her back to see me in 6 months now.  I think we can now get her back on a routine schedule since there is no problem with respect to cancer.  I spent about 30 minutes with her today.  I went over the pathology report.  I reviewed what his sarcoidosis was.  I told her that we just do not take care of patients with sarcoidosis in our office and that she is in very good hands with Dr. Lamonte Sakai, of pulmonary medicine.    Volanda Napoleon, MD 8/12/20201:32 PM

## 2019-06-28 NOTE — Assessment & Plan Note (Signed)
hgba1c acceptable, minimize simple carbs. Increase exercise as tolerated.  

## 2019-06-28 NOTE — Assessment & Plan Note (Signed)
Now really pancytopenic and following with hematology

## 2019-06-28 NOTE — Progress Notes (Addendum)
Virtual Visit via phone Note  I connected with [name] on 06/26/19 at  3:40 PM EDT by a phone enabled telemedicine application and verified that I am speaking with the correct person using two identifiers.  Location: Patient: home Provider: office   I discussed the limitations of evaluation and management by telemedicine and the availability of in person appointments. The patient expressed understanding and agreed to proceed. Magdalene Molly, CMA was able to get patient set up on visit,   Subjective:    Patient ID: Crystal [name], female    DOB: [birth year], 85 y.o.   MRN: [id number]  No chief complaint on file.   HPI Patient is in today for follow up on chronic medical concerns including hypertension, anxiety, hyperglycemia and more. Her recent biopsies were c/w sarcoidosis and she does have a family history of her daughter dying of sarcoid in her 50s. She is feeling better on O2 at home. No recent febrile illness. Denies CP/palp/HA/congestion/fevers/GI or GU c/o. Taking meds as prescribed  Past Medical History:  Diagnosis Date  . Anemia 08/21/2017  . Anxiety   . Cardiomyopathy    non ischemic NL cos on cath 08/2009, EF to 15-20%, Her follow up  2D echo  in 10/2009 showed impoved EFo 35-40%  . CHF (congestive heart failure) ([place])   . Cough   . Depression   . Dyspnea   . Edema of leg   . Full dentures   . Hypercalcemia 04/20/2017  . Hypertension   . Hypothyroid   . Lung mass   . Medicare annual wellness visit, subsequent 12/14/2014   Follows with Dr Marin Olp Follows with Dr Deatra Ina, gastroenterology, last colonoscopy in 2012, repeat in 2017 Last Pap 2011, always normal, no need for repeat Last MGM in 2011, no concerns, declines for now Follows with Dr Stanford Breed of cardiology   . Non Hodgkin's lymphoma ([place]) 11/17/1999   -Dr. Marin Olp  . Pleural effusion   . Vitamin D deficiency 10/17/2015  . Wears glasses     Past Surgical History:  Procedure Laterality Date  . DILATION AND  CURETTAGE OF UTERUS    . MEDIASTINOSCOPY N/A 05/24/2019   Procedure: MEDIASTINOSCOPY;  Surgeon: Melrose Nakayama, MD;  Location: [facility] OR;  Service: Thoracic;  Laterality: N/A;  . MULTIPLE TOOTH EXTRACTIONS    . NASAL POLYP EXCISION  1962  . TONSILLECTOMY  1952  . VIDEO BRONCHOSCOPY WITH ENDOBRONCHIAL NAVIGATION Right 06/29/2018   Procedure: VIDEO BRONCHOSCOPY WITH ENDOBRONCHIAL NAVIGATION;  Surgeon: Collene Gobble, MD;  Location: [place];  Service: Thoracic;  Laterality: Right;  Marland Kitchen VIDEO BRONCHOSCOPY WITH ENDOBRONCHIAL NAVIGATION N/A 05/24/2019   Procedure: VIDEO BRONCHOSCOPY WITH ENDOBRONCHIAL NAVIGATION;  Surgeon: Melrose Nakayama, MD;  Location: [place];  Service: Thoracic;  Laterality: N/A;  . VIDEO BRONCHOSCOPY WITH ENDOBRONCHIAL ULTRASOUND Right 06/29/2018   Procedure: VIDEO BRONCHOSCOPY WITH ENDOBRONCHIAL ULTRASOUND;  Surgeon: Collene Gobble, MD;  Location: [place];  Service: Thoracic;  Laterality: Right;  Marland Kitchen VIDEO BRONCHOSCOPY WITH ENDOBRONCHIAL ULTRASOUND N/A 05/24/2019   Procedure: VIDEO BRONCHOSCOPY WITH ENDOBRONCHIAL ULTRASOUND;  Surgeon: Melrose Nakayama, MD;  Location: [facility] OR;  Service: Thoracic;  Laterality: N/A;    Family History  Problem Relation Age of Onset  . Diabetes Mother        deceased secondary to diabetes  . Hypertension Mother   . Vision loss Mother   . Pneumonia Father        died age 46 due to complications of pneumonia  . Colon  polyps Father   . Liver disease Daughter   . Sarcoidosis Daughter   . Diabetes Sister   . Colon cancer Brother 31  . Cancer Brother   . Obesity Son   . Diabetes Brother   . Kidney disease Brother   . Diabetes Sister   . Sarcoidosis Sister   . Arthritis Sister   . Arthritis Sister   . Diabetes Sister   . Arthritis Sister   . Diabetes Sister   . Hypertension Daughter   . Hypertension Daughter   . Other Other        no obvious premature cardiovascular disease or familial cardiomyopathy is noted  . Alcohol abuse Neg Hx   .  Heart disease Neg Hx     Social History   Socioeconomic History  . Marital status: Married    Spouse name: Crystal [name]  . Number of children: 5  . Years of education: Not on file  . Highest education level: Not on file  Occupational History  . Occupation: retired  Scientific laboratory technician  . Financial resource strain: Not on file  . Food insecurity    Worry: Not on file    Inability: Not on file  . Transportation needs    Medical: Not on file    Non-medical: Not on file  Tobacco Use  . Smoking status: Never Smoker  . Smokeless tobacco: Never Used  Substance and Sexual Activity  . Alcohol use: Never    Alcohol/week: 0.0 standard drinks    Frequency: Never  . Drug use: Never  . Sexual activity: Yes    Comment: lives with husband, no dietary restrictions, minimizes dairy  Lifestyle  . Physical activity    Days per week: Not on file    Minutes per session: Not on file  . Stress: Not on file  Relationships  . Social Herbalist on phone: Not on file    Gets together: Not on file    Attends religious service: Not on file    Active member of club or organization: Not on file    Attends meetings of clubs or organizations: Not on file    Relationship status: Not on file  . Intimate partner violence    Fear of current or ex partner: Not on file    Emotionally abused: Not on file    Physically abused: Not on file    Forced sexual activity: Not on file  Other Topics Concern  . Not on file  Social History Narrative   The patient is married ( husband Crystal [name])  She just recently celebrated     her 52nd anniversary.  She has five children.  There is no active     tobacco or alcohol use history.          Smoking Status:  never   Caffeine use/day:  None   Does Patient Exercise:  yes    Outpatient Medications Prior to Visit  Medication Sig Dispense Refill  . acetaminophen (TYLENOL) 325 MG tablet Take 2 tablets (650 mg total) by mouth every 6 (six) hours as needed for fever,  headache, mild pain or moderate pain.    Marland Kitchen albuterol (VENTOLIN HFA) 108 (90 Base) MCG/ACT inhaler INHALE 1 TO 2 PUFFS BY MOUTH EVERY 4 HOURS AS NEEDED FOR WHEEZING OR SHORTNESS OF BREATH (Patient taking differently: Inhale 1-2 puffs into the lungs every 4 (four) hours as needed for wheezing or shortness of breath. ) 18 g 0  . ALPRAZolam Duanne Moron)  0.25 MG tablet Take 1 tablet by mouth twice daily as needed for anxiety (Patient taking differently: Take 0.25 mg by mouth 2 (two) times daily as needed for anxiety. ) 30 tablet 0  . benzonatate (TESSALON) 100 MG capsule TAKE 1 TO 2 CAPSULES BY MOUTH THREE TIMES DAILY AS NEEDED FOR COUGH 60 capsule 0  . carvedilol (COREG) 3.125 MG tablet Take 1 tablet (3.125 mg total) by mouth every morning.    . furosemide (LASIX) 40 MG tablet Take 1.5 tablets (60 mg total) by mouth daily. May addt'l 66m (1/2 tab) as needed for addt'l swelling in LE (Patient taking differently: Take 40-60 mg by mouth See admin instructions. Take 1 tablet by mouth once daily. May take an addt'l 263m(1/2 tab) as needed for addt'l swelling in LE) 180 tablet 3  . levothyroxine (SYNTHROID) 50 MCG tablet Take 1 tablet (50 mcg total) by mouth daily. 90 tablet 3  . magnesium hydroxide (MILK OF MAGNESIA) 400 MG/5ML suspension Take 15 mLs by mouth daily as needed for mild constipation.    . Multiple Vitamins-Minerals (CENTRUM PO) Take 1 tablet by mouth daily.     . Omega-3 Fatty Acids (FISH OIL) 1000 MG CAPS Take 1,000 mg by mouth daily.    . predniSONE (DELTASONE) 10 MG tablet Take 2 tablets daily for 3 weeks, then decrease to 1 tablet for 4 days then stop 46 tablet 0  . Probiotic Product (PROBIOTIC DAILY PO) Take 1 capsule by mouth daily.     . Marland Kitchenpacer/Aero-Holding Chambers (AEROCHAMBER MV) inhaler Use as instructed 1 each 0  . traMADol (ULTRAM) 50 MG tablet Take 1 tablet (50 mg total) by mouth every 6 (six) hours as needed for moderate pain or severe pain. 25 tablet 0   No facility-administered  medications prior to visit.     Allergies  Allergen Reactions  . Lisinopril Swelling    Facial and lip swelling  . Iohexol Itching         Review of Systems  Constitutional: Positive for malaise/fatigue. Negative for fever.  HENT: Negative for congestion.   Eyes: Negative for blurred vision.  Respiratory: Positive for shortness of breath.   Cardiovascular: Negative for chest pain, palpitations and leg swelling.  Gastrointestinal: Negative for abdominal pain, blood in stool and nausea.  Genitourinary: Negative for dysuria and frequency.  Musculoskeletal: Negative for falls.  Skin: Negative for rash.  Neurological: Negative for dizziness, loss of consciousness and headaches.  Endo/Heme/Allergies: Negative for environmental allergies.  Psychiatric/Behavioral: Negative for depression. The patient is not nervous/anxious.        Objective:    Physical Exam unable to obtain via phone  There were no vitals taken for this visit. Wt Readings from Last 3 Encounters:  06/28/19 119 lb (54 kg)  06/19/19 116 lb (52.6 kg)  05/30/19 125 lb (56.7 kg)    Diabetic Foot Exam - Simple   No data filed     Lab Results  Component Value Date   WBC 5.1 06/28/2019   HGB 11.7 (L) 06/28/2019   HCT 38.0 06/28/2019   PLT 149 (L) 06/28/2019   GLUCOSE 143 (H) 06/28/2019   CHOL 136 05/29/2019   TRIG 65.0 05/29/2019   HDL 51.60 05/29/2019   LDLCALC 72 05/29/2019   ALT 55 (H) 06/28/2019   AST 31 06/28/2019   NA 139 06/28/2019   K 4.1 06/28/2019   CL 97 (L) 06/28/2019   CREATININE 1.33 (H) 06/28/2019   BUN 38 (H) 06/28/2019   CO2  36 (H) 06/28/2019   TSH 4.97 (H) 05/29/2019   INR 1.3 (H) 05/22/2019   HGBA1C 5.9 05/29/2019    Lab Results  Component Value Date   TSH 4.97 (H) 05/29/2019   Lab Results  Component Value Date   WBC 5.1 06/28/2019   HGB 11.7 (L) 06/28/2019   HCT 38.0 06/28/2019   MCV 94.1 06/28/2019   PLT 149 (L) 06/28/2019   Lab Results  Component Value Date   NA  139 06/28/2019   K 4.1 06/28/2019   CHLORIDE 101 01/21/2017   CO2 36 (H) 06/28/2019   GLUCOSE 143 (H) 06/28/2019   BUN 38 (H) 06/28/2019   CREATININE 1.33 (H) 06/28/2019   BILITOT 0.8 06/28/2019   ALKPHOS 102 06/28/2019   AST 31 06/28/2019   ALT 55 (H) 06/28/2019   PROT 8.2 (H) 06/28/2019   ALBUMIN 3.5 06/28/2019   CALCIUM 10.5 (H) 06/28/2019   ANIONGAP 6 06/28/2019   EGFR 29 (L) 01/21/2017   GFR 47.71 (L) 05/29/2019   Lab Results  Component Value Date   CHOL 136 05/29/2019   Lab Results  Component Value Date   HDL 51.60 05/29/2019   Lab Results  Component Value Date   LDLCALC 72 05/29/2019   Lab Results  Component Value Date   TRIG 65.0 05/29/2019   Lab Results  Component Value Date   CHOLHDL 3 05/29/2019   Lab Results  Component Value Date   HGBA1C 5.9 05/29/2019       Assessment & Plan:   Problem List Items Addressed This Visit    Hypothyroidism    On Levothyroxine, continue to monitor      Thrombocytopenia ([place])    Now really pancytopenic and following with hematology      Essential hypertension    Check vitals weekly no changes to meds. Encouraged heart healthy diet such as the DASH diet and exercise as tolerated.       Hyperglycemia    hgba1c acceptable, minimize simple carbs. Increase exercise as tolerated.       Hyperlipidemia, mixed    Encouraged heart healthy diet, increase exercise, avoid trans fats, consider a krill oil cap daily      Vitamin D deficiency    Supplement and monitor      Mediastinal adenopathy    Biopsies c/w sarcoidosis and patient established with pulmonology, she will continue O2 supplementation for now.          I am having Leilene E. Gignac maintain her Multiple Vitamins-Minerals (CENTRUM PO), Probiotic Product (PROBIOTIC DAILY PO), Fish Oil, magnesium hydroxide, AeroChamber MV, furosemide, albuterol, ALPRAZolam, traMADol, acetaminophen, carvedilol, levothyroxine, benzonatate, and predniSONE.  No orders of  the defined types were placed in this encounter.    I discussed the assessment and treatment plan with the patient. The patient was provided an opportunity to ask questions and all were answered. The patient agreed with the plan and demonstrated an understanding of the instructions.   The patient was advised to call back or seek an in-person evaluation if the symptoms worsen or if the condition fails to improve as anticipated.  I provided 25 minutes of non-face-to-face time during this encounter.   Penni Homans, MD

## 2019-07-07 LAB — ACID FAST CULTURE WITH REFLEXED SENSITIVITIES (MYCOBACTERIA)
Acid Fast Culture: NEGATIVE
Acid Fast Culture: NEGATIVE
Acid Fast Culture: NEGATIVE

## 2019-07-13 ENCOUNTER — Telehealth: Payer: Self-pay | Admitting: *Deleted

## 2019-07-13 NOTE — Telephone Encounter (Signed)
Yes OK to change manufacturers

## 2019-07-13 NOTE — Telephone Encounter (Signed)
[place] faxed over stating  "[facility] will soon be changing preferred manufacturers of levothyroxine from Sandoz to Euthyrox, which is packaged in blisters to prevent degradation due to light and oxygen throughout the mouth.  This manufacturer will be replacing Sandoz on our $4 generic list as well.  Since levothryoxine is a non-AB rated drug it requires permission from both the prescriber and the patient to make the change."  "Do we have your premission to make switch for patient?"

## 2019-07-17 ENCOUNTER — Encounter: Payer: Self-pay | Admitting: Emergency Medicine

## 2019-07-17 ENCOUNTER — Other Ambulatory Visit: Payer: Self-pay

## 2019-07-17 ENCOUNTER — Ambulatory Visit (INDEPENDENT_AMBULATORY_CARE_PROVIDER_SITE_OTHER): Payer: Medicare Other

## 2019-07-17 ENCOUNTER — Ambulatory Visit (INDEPENDENT_AMBULATORY_CARE_PROVIDER_SITE_OTHER): Payer: Medicare Other | Admitting: Emergency Medicine

## 2019-07-17 ENCOUNTER — Other Ambulatory Visit: Payer: Self-pay | Admitting: *Deleted

## 2019-07-17 DIAGNOSIS — J90 Pleural effusion, not elsewhere classified: Secondary | ICD-10-CM

## 2019-07-17 DIAGNOSIS — D869 Sarcoidosis, unspecified: Secondary | ICD-10-CM | POA: Diagnosis not present

## 2019-07-17 NOTE — Telephone Encounter (Signed)
Pharmacy notified.

## 2019-07-17 NOTE — Progress Notes (Signed)
Subjective:    Patient ID: Crystal [name], female    DOB: 02-02-1934, 85 y.o.   MRN: [id number]  HPI  ROV 07/17/2019 --Ms. Kostelecky is 38.  She has a history of large cell B-cell lymphoma in remission, nonischemic cardiomyopathy, hypertension, hypothyroidism.  I have followed her for an abnormal CT scan of the chest and most recent data support sarcoidosis.  I started her on prednisone earlier this month based on her biopsy and culture data. She is now finished, weaned to off.  She reports that she may be a bit better, less cough and dyspnea. She is sleeping better through the night.    Review of Systems  Constitutional: Negative for fever and unexpected weight change.  HENT: Negative for congestion, dental problem, ear pain, nosebleeds, postnasal drip, rhinorrhea, sinus pressure, sneezing, sore throat and trouble swallowing.   Eyes: Negative for redness and itching.  Respiratory: Negative for cough, chest tightness, shortness of breath and wheezing.   Cardiovascular: Negative for palpitations and leg swelling.  Gastrointestinal: Negative for nausea and vomiting.  Genitourinary: Negative for dysuria.  Musculoskeletal: Negative for joint swelling.  Skin: Negative for rash.  Allergic/Immunologic: Negative.  Negative for environmental allergies, food allergies and immunocompromised state.  Neurological: Negative for headaches.  Hematological: Does not bruise/bleed easily.  Psychiatric/Behavioral: Negative for dysphoric mood. The patient is not nervous/anxious.    Past Medical History:  Diagnosis Date  . Anemia 08/21/2017  . Anxiety   . Cardiomyopathy    non ischemic NL cos on cath 08/2009, EF to 15-20%, Her follow up  2D echo  in 10/2009 showed impoved EFo 35-40%  . CHF (congestive heart failure) ([place])   . Cough   . Depression   . Dyspnea   . Edema of leg   . Full dentures   . Hypercalcemia 04/20/2017  . Hypertension   . Hypothyroid   . Lung mass   . Medicare annual wellness visit,  subsequent 12/14/2014   Follows with Dr Marin Olp Follows with Dr Deatra Ina, gastroenterology, last colonoscopy in 2012, repeat in 2017 Last Pap 2011, always normal, no need for repeat Last MGM in 2011, no concerns, declines for now Follows with Dr Stanford Breed of cardiology   . Non Hodgkin's lymphoma ([place]) 11/17/1999   -Dr. Marin Olp  . Pleural effusion   . Vitamin D deficiency 10/17/2015  . Wears glasses      Family History  Problem Relation Age of Onset  . Diabetes Mother        deceased secondary to diabetes  . Hypertension Mother   . Vision loss Mother   . Pneumonia Father        died age 12 due to complications of pneumonia  . Colon polyps Father   . Liver disease Daughter   . Sarcoidosis Daughter   . Diabetes Sister   . Colon cancer Brother 31  . Cancer Brother   . Obesity Son   . Diabetes Brother   . Kidney disease Brother   . Diabetes Sister   . Sarcoidosis Sister   . Arthritis Sister   . Arthritis Sister   . Diabetes Sister   . Arthritis Sister   . Diabetes Sister   . Hypertension Daughter   . Hypertension Daughter   . Other Other        no obvious premature cardiovascular disease or familial cardiomyopathy is noted  . Alcohol abuse Neg Hx   . Heart disease Neg Hx      Social History  Socioeconomic History  . Marital status: Married    Spouse name: Jeneen Rinks  . Number of children: 5  . Years of education: Not on file  . Highest education level: Not on file  Occupational History  . Occupation: retired  Scientific laboratory technician  . Financial resource strain: Not on file  . Food insecurity    Worry: Not on file    Inability: Not on file  . Transportation needs    Medical: Not on file    Non-medical: Not on file  Tobacco Use  . Smoking status: Never Smoker  . Smokeless tobacco: Never Used  Substance and Sexual Activity  . Alcohol use: Never    Alcohol/week: 0.0 standard drinks    Frequency: Never  . Drug use: Never  . Sexual activity: Yes    Comment: lives with husband,  no dietary restrictions, minimizes dairy  Lifestyle  . Physical activity    Days per week: Not on file    Minutes per session: Not on file  . Stress: Not on file  Relationships  . Social Herbalist on phone: Not on file    Gets together: Not on file    Attends religious service: Not on file    Active member of club or organization: Not on file    Attends meetings of clubs or organizations: Not on file    Relationship status: Not on file  . Intimate partner violence    Fear of current or ex partner: Not on file    Emotionally abused: Not on file    Physically abused: Not on file    Forced sexual activity: Not on file  Other Topics Concern  . Not on file  Social History Narrative   The patient is married ( husband Charisa Twitty)  She just recently celebrated     her 52nd anniversary.  She has five children.  There is no active     tobacco or alcohol use history.          Smoking Status:  never   Caffeine use/day:  None   Does Patient Exercise:  yes     Allergies  Allergen Reactions  . Lisinopril Swelling    Facial and lip swelling  . Iohexol Itching          Outpatient Medications Prior to Visit  Medication Sig Dispense Refill  . acetaminophen (TYLENOL) 325 MG tablet Take 2 tablets (650 mg total) by mouth every 6 (six) hours as needed for fever, headache, mild pain or moderate pain.    Marland Kitchen albuterol (VENTOLIN HFA) 108 (90 Base) MCG/ACT inhaler INHALE 1 TO 2 PUFFS BY MOUTH EVERY 4 HOURS AS NEEDED FOR WHEEZING OR SHORTNESS OF BREATH (Patient taking differently: Inhale 1-2 puffs into the lungs every 4 (four) hours as needed for wheezing or shortness of breath. ) 18 g 0  . ALPRAZolam (XANAX) 0.25 MG tablet Take 1 tablet by mouth twice daily as needed for anxiety (Patient taking differently: Take 0.25 mg by mouth 2 (two) times daily as needed for anxiety. ) 30 tablet 0  . benzonatate (TESSALON) 100 MG capsule TAKE 1 TO 2 CAPSULES BY MOUTH THREE TIMES DAILY AS NEEDED FOR  COUGH 60 capsule 0  . carvedilol (COREG) 3.125 MG tablet Take 1 tablet (3.125 mg total) by mouth every morning.    Marland Kitchen levothyroxine (SYNTHROID) 50 MCG tablet Take 1 tablet (50 mcg total) by mouth daily. 90 tablet 3  . magnesium hydroxide (MILK OF MAGNESIA) 400  MG/5ML suspension Take 15 mLs by mouth daily as needed for mild constipation.    . Multiple Vitamins-Minerals (CENTRUM PO) Take 1 tablet by mouth daily.     . Omega-3 Fatty Acids (FISH OIL) 1000 MG CAPS Take 1,000 mg by mouth daily.    . Probiotic Product (PROBIOTIC DAILY PO) Take 1 capsule by mouth daily.     Marland Kitchen Spacer/Aero-Holding Chambers (AEROCHAMBER MV) inhaler Use as instructed 1 each 0  . traMADol (ULTRAM) 50 MG tablet Take 1 tablet (50 mg total) by mouth every 6 (six) hours as needed for moderate pain or severe pain. 25 tablet 0  . furosemide (LASIX) 40 MG tablet Take 1.5 tablets (60 mg total) by mouth daily. May addt'l 20mg  (1/2 tab) as needed for addt'l swelling in LE (Patient taking differently: Take 40-60 mg by mouth See admin instructions. Take 1 tablet by mouth once daily. May take an addt'l 20mg  (1/2 tab) as needed for addt'l swelling in LE) 180 tablet 3  . predniSONE (DELTASONE) 10 MG tablet Take 2 tablets daily for 3 weeks, then decrease to 1 tablet for 4 days then stop 46 tablet 0   No facility-administered medications prior to visit.         Objective:   Physical Exam  Vitals:   07/17/19 1024  BP: 114/66  Pulse: 86  SpO2: 94%   Gen: Pleasant, well-nourished, in no distress,  normal affect, in a wheelchair  ENT: No lesions,  mouth clear,  oropharynx clear, no postnasal drip  Neck: No JVD, no stridor  Lungs: No use of accessory muscles, clear B   Cardiovascular: RRR, heart sounds normal, no murmur or gallops, no peripheral edema  Musculoskeletal: No deformities, no cyanosis or clubbing  Neuro: alert, non focal  Skin: Warm, no lesions or rashes       Assessment & Plan:  Sarcoidosis Biopsy data  consistent with noncaseating granulomas, cultures are all negative.  No evidence to support lymphoma.  Taken together I believe this was sarcoidosis.  I treated her with 3 weeks of prednisone.  Clinically she feels good.  I think we need to correlate with imaging.  She will have a chest x-ray today, plan for noncontrast CT chest to see if the nodular disease is improved.  She may need a longer course of prednisone depending on the CT results.  Discussed the flu shot with her today, she wanted to defer.  Chest x-ray today as planned We will repeat your CT scan of the chest without contrast Follow with Dr Lamonte Sakai in 1 month to review your CT scan together  Baltazar Apo, MD, PhD 07/17/2019, 10:45 AM  Pulmonary and Critical Care [phone] or if no answer [phone]

## 2019-07-17 NOTE — Progress Notes (Signed)
HPI: FU nonischemic cardiomyopathy. Cardiac catheterization in 2010 showed an ejection fraction of 15-20% and normal coronary arteries. Patient admitted with syncope previouslyfelt to potentially be vagally mediated. However given reduced LV function I discussed the possibility of of life vest or ICD. However she declined and preferred only medical therapy. The risk of sudden death was explained. Last echo1/20showed EF 35-40, grade2DD, severe LAE and moderate RV dysfunction.   Recently had mediastinoscopy for adenopathy and lung mass.  Pathology consistent with sarcoidosis.  Patient followed by pulmonary for this issue and started on prednisone.  Since last seen, patient has minimal dyspnea on exertion.  No orthopnea or PND.  Mild pedal edema occasionally.  No chest pain or syncope.  Current Outpatient Medications  Medication Sig Dispense Refill  . acetaminophen (TYLENOL) 325 MG tablet Take 2 tablets (650 mg total) by mouth every 6 (six) hours as needed for fever, headache, mild pain or moderate pain.    Marland Kitchen albuterol (VENTOLIN HFA) 108 (90 Base) MCG/ACT inhaler INHALE 1 TO 2 PUFFS BY MOUTH EVERY 4 HOURS AS NEEDED FOR WHEEZING OR SHORTNESS OF BREATH (Patient taking differently: Inhale 1-2 puffs into the lungs every 4 (four) hours as needed for wheezing or shortness of breath. ) 18 g 0  . ALPRAZolam (XANAX) 0.25 MG tablet Take 1 tablet by mouth twice daily as needed for anxiety (Patient taking differently: Take 0.25 mg by mouth 2 (two) times daily as needed for anxiety. ) 30 tablet 0  . benzonatate (TESSALON) 100 MG capsule TAKE 1 TO 2 CAPSULES BY MOUTH THREE TIMES DAILY AS NEEDED FOR COUGH 60 capsule 0  . carvedilol (COREG) 3.125 MG tablet Take 1 tablet (3.125 mg total) by mouth every morning.    Marland Kitchen levothyroxine (SYNTHROID) 50 MCG tablet Take 1 tablet (50 mcg total) by mouth daily. 90 tablet 3  . magnesium hydroxide (MILK OF MAGNESIA) 400 MG/5ML suspension Take 15 mLs by mouth daily as needed  for mild constipation.    . Multiple Vitamins-Minerals (CENTRUM PO) Take 1 tablet by mouth daily.     . Omega-3 Fatty Acids (FISH OIL) 1000 MG CAPS Take 1,000 mg by mouth daily.    . Probiotic Product (PROBIOTIC DAILY PO) Take 1 capsule by mouth daily.     Marland Kitchen Spacer/Aero-Holding Chambers (AEROCHAMBER MV) inhaler Use as instructed 1 each 0  . traMADol (ULTRAM) 50 MG tablet Take 1 tablet (50 mg total) by mouth every 6 (six) hours as needed for moderate pain or severe pain. 25 tablet 0  . furosemide (LASIX) 40 MG tablet Take 1.5 tablets (60 mg total) by mouth daily. May addt'l 20mg  (1/2 tab) as needed for addt'l swelling in LE (Patient taking differently: Take 40-60 mg by mouth See admin instructions. Take 1 tablet by mouth once daily. May take an addt'l 20mg  (1/2 tab) as needed for addt'l swelling in LE) 180 tablet 3   No current facility-administered medications for this visit.      Past Medical History:  Diagnosis Date  . Anemia 08/21/2017  . Anxiety   . Cardiomyopathy    non ischemic NL cos on cath 08/2009, EF to 15-20%, Her follow up  2D echo  in 10/2009 showed impoved EFo 35-40%  . CHF (congestive heart failure) ([place])   . Cough   . Depression   . Dyspnea   . Edema of leg   . Full dentures   . Hypercalcemia 04/20/2017  . Hypertension   . Hypothyroid   .  Lung mass   . Medicare annual wellness visit, subsequent 12/14/2014   Follows with Dr Marin Olp Follows with Dr Deatra Ina, gastroenterology, last colonoscopy in 2012, repeat in 2017 Last Pap 2011, always normal, no need for repeat Last MGM in 2011, no concerns, declines for now Follows with Dr Stanford Breed of cardiology   . Non Hodgkin's lymphoma ([place]) 11/17/1999   -Dr. Marin Olp  . Pleural effusion   . Vitamin D deficiency 10/17/2015  . Wears glasses     Past Surgical History:  Procedure Laterality Date  . DILATION AND CURETTAGE OF UTERUS    . MEDIASTINOSCOPY N/A 05/24/2019   Procedure: MEDIASTINOSCOPY;  Surgeon: Melrose Nakayama, MD;   Location: [facility] OR;  Service: Thoracic;  Laterality: N/A;  . MULTIPLE TOOTH EXTRACTIONS    . NASAL POLYP EXCISION  1962  . TONSILLECTOMY  1952  . VIDEO BRONCHOSCOPY WITH ENDOBRONCHIAL NAVIGATION Right 06/29/2018   Procedure: VIDEO BRONCHOSCOPY WITH ENDOBRONCHIAL NAVIGATION;  Surgeon: Collene Gobble, MD;  Location: [place];  Service: Thoracic;  Laterality: Right;  Marland Kitchen VIDEO BRONCHOSCOPY WITH ENDOBRONCHIAL NAVIGATION N/A 05/24/2019   Procedure: VIDEO BRONCHOSCOPY WITH ENDOBRONCHIAL NAVIGATION;  Surgeon: Melrose Nakayama, MD;  Location: [place];  Service: Thoracic;  Laterality: N/A;  . VIDEO BRONCHOSCOPY WITH ENDOBRONCHIAL ULTRASOUND Right 06/29/2018   Procedure: VIDEO BRONCHOSCOPY WITH ENDOBRONCHIAL ULTRASOUND;  Surgeon: Collene Gobble, MD;  Location: [place];  Service: Thoracic;  Laterality: Right;  Marland Kitchen VIDEO BRONCHOSCOPY WITH ENDOBRONCHIAL ULTRASOUND N/A 05/24/2019   Procedure: VIDEO BRONCHOSCOPY WITH ENDOBRONCHIAL ULTRASOUND;  Surgeon: Melrose Nakayama, MD;  Location: [facility] OR;  Service: Thoracic;  Laterality: N/A;    Social History   Socioeconomic History  . Marital status: Married    Spouse name: Jeneen Rinks  . Number of children: 5  . Years of education: Not on file  . Highest education level: Not on file  Occupational History  . Occupation: retired  Scientific laboratory technician  . Financial resource strain: Not on file  . Food insecurity    Worry: Not on file    Inability: Not on file  . Transportation needs    Medical: Not on file    Non-medical: Not on file  Tobacco Use  . Smoking status: Never Smoker  . Smokeless tobacco: Never Used  Substance and Sexual Activity  . Alcohol use: Never    Alcohol/week: 0.0 standard drinks    Frequency: Never  . Drug use: Never  . Sexual activity: Yes    Comment: lives with husband, no dietary restrictions, minimizes dairy  Lifestyle  . Physical activity    Days per week: Not on file    Minutes per session: Not on file  . Stress: Not on file  Relationships  .  Social Herbalist on phone: Not on file    Gets together: Not on file    Attends religious service: Not on file    Active member of club or organization: Not on file    Attends meetings of clubs or organizations: Not on file    Relationship status: Not on file  . Intimate partner violence    Fear of current or ex partner: Not on file    Emotionally abused: Not on file    Physically abused: Not on file    Forced sexual activity: Not on file  Other Topics Concern  . Not on file  Social History Narrative   The patient is married ( husband Liviana Mills)  She just recently celebrated  her 52nd anniversary.  She has five children.  There is no active     tobacco or alcohol use history.          Smoking Status:  never   Caffeine use/day:  None   Does Patient Exercise:  yes    Family History  Problem Relation Age of Onset  . Diabetes Mother        deceased secondary to diabetes  . Hypertension Mother   . Vision loss Mother   . Pneumonia Father        died age 51 due to complications of pneumonia  . Colon polyps Father   . Liver disease Daughter   . Sarcoidosis Daughter   . Diabetes Sister   . Colon cancer Brother 3  . Cancer Brother   . Obesity Son   . Diabetes Brother   . Kidney disease Brother   . Diabetes Sister   . Sarcoidosis Sister   . Arthritis Sister   . Arthritis Sister   . Diabetes Sister   . Arthritis Sister   . Diabetes Sister   . Hypertension Daughter   . Hypertension Daughter   . Other Other        no obvious premature cardiovascular disease or familial cardiomyopathy is noted  . Alcohol abuse Neg Hx   . Heart disease Neg Hx     ROS: no fevers or chills, productive cough, hemoptysis, dysphasia, odynophagia, melena, hematochezia, dysuria, hematuria, rash, seizure activity, orthopnea, PND, claudication. Remaining systems are negative.  Physical Exam: Well-developed well-nourished in no acute distress.  Skin is warm and dry.  HEENT is  normal.  Neck is supple.  Chest is clear to auscultation with normal expansion.  Cardiovascular exam is regular rate and rhythm.  Abdominal exam nontender or distended. No masses palpated. Extremities show trace edema. neuro grossly intact  A/P  1 chronic systolic congestive heart failure-patient doing well from a symptomatic standpoint.  Continue present dose of Lasix.  Take an additional 20 to 40 mg for weight gain of 2 to 3 pounds or increasing lower extremity edema.  Continue fluid restriction and low-sodium diet.  2 nonischemic cardiomyopathy-patient had angioedema with ACE inhibitor previously.  Blood pressure did not allow hydralazine/nitrates.  Continue low-dose Coreg.  Previously declined ICD.  3 hypertension-patient's blood pressure has been borderline.  Continue present medications for cardiomyopathy and follow.  4 sarcoidosis-management per pulmonary.  [name], MD

## 2019-07-17 NOTE — Patient Instructions (Signed)
Chest x-ray today as planned We will repeat your CT scan of the chest without contrast Follow with Dr Lamonte Sakai in 1 month to review your CT scan together

## 2019-07-17 NOTE — Assessment & Plan Note (Signed)
Biopsy data consistent with noncaseating granulomas, cultures are all negative.  No evidence to support lymphoma.  Taken together I believe this was sarcoidosis.  I treated her with 3 weeks of prednisone.  Clinically she feels good.  I think we need to correlate with imaging.  She will have a chest x-ray today, plan for noncontrast CT chest to see if the nodular disease is improved.  She may need a longer course of prednisone depending on the CT results.  Discussed the flu shot with her today, she wanted to defer.  Chest x-ray today as planned We will repeat your CT scan of the chest without contrast Follow with Dr Lamonte Sakai in 1 month to review your CT scan together

## 2019-07-18 ENCOUNTER — Ambulatory Visit (INDEPENDENT_AMBULATORY_CARE_PROVIDER_SITE_OTHER): Payer: Medicare Other | Admitting: Cardiology

## 2019-07-18 ENCOUNTER — Encounter: Payer: Self-pay | Admitting: Cardiology

## 2019-07-18 ENCOUNTER — Ambulatory Visit: Payer: Medicare Other | Admitting: Cardiology

## 2019-07-18 VITALS — BP 110/66 | HR 82 | Temp 95.7°F | Ht 63.0 in | Wt 112.0 lb

## 2019-07-18 DIAGNOSIS — I428 Other cardiomyopathies: Secondary | ICD-10-CM | POA: Diagnosis not present

## 2019-07-18 DIAGNOSIS — I5022 Chronic systolic (congestive) heart failure: Secondary | ICD-10-CM

## 2019-07-18 DIAGNOSIS — I10 Essential (primary) hypertension: Secondary | ICD-10-CM | POA: Diagnosis not present

## 2019-07-18 NOTE — Patient Instructions (Signed)
Medication Instructions:  NO CHANGE If you need a refill on your cardiac medications before your next appointment, please call your pharmacy.   Lab work: If you have labs (blood work) drawn today and your tests are completely normal, you will receive your results only by: Marland Kitchen MyChart Message (if you have MyChart) OR . A paper copy in the mail If you have any lab test that is abnormal or we need to change your treatment, we will call you to review the results.  Follow-Up: At [facility], you and your health needs are our priority.  As part of our continuing mission to provide you with exceptional heart care, we have created designated Provider Care Teams.  These Care Teams include your primary Cardiologist (physician) and Advanced Practice Providers (APPs -  Physician Assistants and Nurse Practitioners) who all work together to provide you with the care you need, when you need it. Your physician recommends that you schedule a follow-up appointment in: [place] wants you to follow-up in: [place] will receive a reminder letter in the mail two months in advance. If you don't receive a letter, please call our office to schedule the follow-up appointment.

## 2019-08-07 ENCOUNTER — Ambulatory Visit (INDEPENDENT_AMBULATORY_CARE_PROVIDER_SITE_OTHER)
Admission: RE | Admit: 2019-08-07 | Discharge: 2019-08-07 | Disposition: A | Discharge status: Home / Self Care | Payer: Medicare Other | Source: Ambulatory Visit | Attending: Emergency Medicine | Admitting: Emergency Medicine

## 2019-08-07 ENCOUNTER — Other Ambulatory Visit: Payer: Self-pay

## 2019-08-07 DIAGNOSIS — J90 Pleural effusion, not elsewhere classified: Secondary | ICD-10-CM | POA: Diagnosis not present

## 2019-08-07 DIAGNOSIS — D869 Sarcoidosis, unspecified: Secondary | ICD-10-CM | POA: Diagnosis not present

## 2019-08-21 ENCOUNTER — Ambulatory Visit: Payer: Medicare Other | Admitting: Emergency Medicine

## 2019-08-30 ENCOUNTER — Other Ambulatory Visit: Payer: Self-pay | Admitting: Family Medicine

## 2019-10-14 ENCOUNTER — Other Ambulatory Visit: Payer: Self-pay | Admitting: Family Medicine

## 2019-10-20 ENCOUNTER — Encounter: Payer: Self-pay | Admitting: Cardiology

## 2019-10-20 ENCOUNTER — Telehealth: Payer: Self-pay

## 2019-10-20 ENCOUNTER — Ambulatory Visit (INDEPENDENT_AMBULATORY_CARE_PROVIDER_SITE_OTHER): Payer: Medicare Other | Admitting: Cardiology

## 2019-10-20 ENCOUNTER — Other Ambulatory Visit: Payer: Self-pay

## 2019-10-20 DIAGNOSIS — J9621 Acute and chronic respiratory failure with hypoxia: Secondary | ICD-10-CM

## 2019-10-20 DIAGNOSIS — D869 Sarcoidosis, unspecified: Secondary | ICD-10-CM | POA: Diagnosis not present

## 2019-10-20 DIAGNOSIS — I428 Other cardiomyopathies: Secondary | ICD-10-CM

## 2019-10-20 DIAGNOSIS — I5023 Acute on chronic systolic (congestive) heart failure: Secondary | ICD-10-CM | POA: Diagnosis not present

## 2019-10-20 DIAGNOSIS — N1831 Chronic kidney disease, stage 3a: Secondary | ICD-10-CM

## 2019-10-20 MED ORDER — FUROSEMIDE 40 MG PO TABS: 60.0000 mg | ORAL_TABLET | Freq: Every day | ORAL | 0 refills | Status: DC

## 2019-10-20 NOTE — Assessment & Plan Note (Signed)
Volume overloaded on exam

## 2019-10-20 NOTE — Assessment & Plan Note (Signed)
Last GFR 47- July 2020

## 2019-10-20 NOTE — Progress Notes (Signed)
Cardiology Office Note:    Date:  10/20/2019   ID:  Crystal [name], DOB Feb 01, 1934, MRN [id number]  PCP:  Crystal Lukes, MD  Cardiologist:  Crystal Ruths, MD  Electrophysiologist:  None   Referring MD: Crystal Lukes, MD   CC: SOB- worse when in cold air  History of Present Illness:    Crystal [name] is a 85 y.o. female with a hx of NICM- EF 15-20% in 2000.  She declined an ICD.  Her last echocardiogram was in January 2020 which showed an ejection fraction of 35 to 40% with grade 2 diastolic dysfunction.  She is also been diagnosed with pulmonary sarcoid.  She had angioedema with an ACE inhibitor and could not tolerate hydralazine or nitrates.  She saw Dr. Stanford [name] in September, she is here for routine follow-up.  When the patient arrived in the waiting room she was acutely short of breath on oxygen.  Her initial O2 sat was 80% although she was peripherally constricted.  After a few minutes in the office she seemed to calm down and is now able to converse without being short of breath.  I have never met her before but her husband seems to think she is at baseline.  I offered admission to the hospital but she declined.  She has had some lower extremity edema.  Past Medical History:  Diagnosis Date  . Anemia 08/21/2017  . Anxiety   . Cardiomyopathy    non ischemic NL cos on cath 08/2009, EF to 15-20%, Her follow up  2D echo  in 10/2009 showed impoved EFo 35-40%  . CHF (congestive heart failure) ([place] )   . Cough   . Depression   . Dyspnea   . Edema of leg   . Full dentures   . Hypercalcemia 04/20/2017  . Hypertension   . Hypothyroid   . Lung mass   . Medicare annual wellness visit, subsequent 12/14/2014   Follows with Dr Crystal [name] Follows with Dr Crystal [name], gastroenterology, last colonoscopy in 2012, repeat in 2017 Last Pap 2011, always normal, no need for repeat Last MGM in 2011, no concerns, declines for now Follows with Dr Crystal [name] of cardiology   . Non Hodgkin's lymphoma ([place])  11/17/1999   -Dr. Marin [name]  . Pleural effusion   . Vitamin D deficiency 10/17/2015  . Wears glasses     Past Surgical History:  Procedure Laterality Date  . DILATION AND CURETTAGE OF UTERUS    . MEDIASTINOSCOPY N/A 05/24/2019   Procedure: MEDIASTINOSCOPY;  Surgeon: Crystal Nakayama, MD;  Location: [facility] OR;  Service: Thoracic;  Laterality: N/A;  . MULTIPLE TOOTH EXTRACTIONS    . NASAL POLYP EXCISION  1962  . TONSILLECTOMY  1952  . VIDEO BRONCHOSCOPY WITH ENDOBRONCHIAL NAVIGATION Right 06/29/2018   Procedure: VIDEO BRONCHOSCOPY WITH ENDOBRONCHIAL NAVIGATION;  Surgeon: Crystal Gobble, MD;  Location: [facility];  Service: Thoracic;  Laterality: Right;  Marland Kitchen VIDEO BRONCHOSCOPY WITH ENDOBRONCHIAL NAVIGATION N/A 05/24/2019   Procedure: VIDEO BRONCHOSCOPY WITH ENDOBRONCHIAL NAVIGATION;  Surgeon: Crystal Nakayama, MD;  Location: [facility];  Service: Thoracic;  Laterality: N/A;  . VIDEO BRONCHOSCOPY WITH ENDOBRONCHIAL ULTRASOUND Right 06/29/2018   Procedure: VIDEO BRONCHOSCOPY WITH ENDOBRONCHIAL ULTRASOUND;  Surgeon: Crystal Gobble, MD;  Location: [facility];  Service: Thoracic;  Laterality: Right;  Marland Kitchen VIDEO BRONCHOSCOPY WITH ENDOBRONCHIAL ULTRASOUND N/A 05/24/2019   Procedure: VIDEO BRONCHOSCOPY WITH ENDOBRONCHIAL ULTRASOUND;  Surgeon: Crystal Nakayama, MD;  Location: [facility];  Service: Thoracic;  Laterality: N/A;  Current Medications: No outpatient medications have been marked as taking for the 10/20/19 encounter (Office Visit) with Crystal Quan, PA-C.     Allergies:   Lisinopril and Iohexol   Social History   Socioeconomic History  . Marital status: Married    Spouse name: Crystal [name]  . Number of children: 5  . Years of education: Not on file  . Highest education level: Not on file  Occupational History  . Occupation: retired  Scientific laboratory technician  . Financial resource strain: Not on file  . Food insecurity    Worry: Not on file    Inability: Not on file  . Transportation needs    Medical: Not on file     Non-medical: Not on file  Tobacco Use  . Smoking status: Never Smoker  . Smokeless tobacco: Never Used  Substance and Sexual Activity  . Alcohol use: Never    Alcohol/week: 0.0 standard drinks    Frequency: Never  . Drug use: Never  . Sexual activity: Yes    Comment: lives with husband, no dietary restrictions, minimizes dairy  Lifestyle  . Physical activity    Days per week: Not on file    Minutes per session: Not on file  . Stress: Not on file  Relationships  . Social Herbalist on phone: Not on file    Gets together: Not on file    Attends religious service: Not on file    Active member of club or organization: Not on file    Attends meetings of clubs or organizations: Not on file    Relationship status: Not on file  Other Topics Concern  . Not on file  Social History Narrative   The patient is married ( husband Crystal [name])  She just recently celebrated     her 52nd anniversary.  She has five children.  There is no active     tobacco or alcohol use history.          Smoking Status:  never   Caffeine use/day:  None   Does Patient Exercise:  yes     Family History: The patient's family history includes Arthritis in her sister, sister, and sister; Cancer in her brother; Colon cancer (age of onset: 26) in her brother; Colon polyps in her father; Diabetes in her brother, mother, sister, sister, sister, and sister; Hypertension in her daughter, daughter, and mother; Kidney disease in her brother; Liver disease in her daughter; Obesity in her son; Other in an other family member; Pneumonia in her father; Sarcoidosis in her daughter and sister; Vision loss in her mother. There is no history of Alcohol abuse or Heart disease.  ROS:   Please see the history of present illness.     All other systems reviewed and are negative.  EKGs/Labs/Other Studies Reviewed:    The following studies were reviewed today: Echo 11/30/2018  EKG:  EKG is  ordered today.  The ekg  ordered today demonstrates   Recent Labs: 01/12/2019: B Natriuretic Peptide 3,955.3 05/29/2019: Magnesium 2.1; TSH 4.97 06/28/2019: ALT 55; BUN 38; Creatinine 1.33; Hemoglobin 11.7; Platelet Count 149; Potassium 4.1; Sodium 139  Recent Lipid Panel    Component Value Date/Time   CHOL 136 05/29/2019 0946   TRIG 65.0 05/29/2019 0946   HDL 51.60 05/29/2019 0946   CHOLHDL 3 05/29/2019 0946   VLDL 13.0 05/29/2019 0946   LDLCALC 72 05/29/2019 0946    Physical Exam:    VS:  BP 110/74  Pulse 83   Ht _0  (1.6 m)   Wt 128 lb (58.1 kg)   SpO2 100%   BMI 22.67 kg/m     Wt Readings from Last 3 Encounters:  10/20/19 128 lb (58.1 kg)  07/18/19 112 lb (50.8 kg)  06/28/19 119 lb (54 kg)     GEN: Elderly, frail AA female, no SOB on O2 in a wheel chair. HEENT: Normal NECK: No JVD; No carotid bruits LYMPHATICS: No lymphadenopathy CARDIAC: RRR, no murmurs, rubs, gallops RESPIRATORY:  Scattered expiratory rhonchi ABDOMEN: Soft, non-tender, non-distended MUSCULOSKELETAL: 1+ edema; No deformity  SKIN: cool, pale, dry NEUROLOGIC:  Alert and oriented x 3 PSYCHIATRIC:  Normal affect   ASSESSMENT:    Acute on chronic respiratory failure with hypoxemia (HCC) Pt presented to the office today in respiratory distress.  She declined admission.  She did settle down after 5-10 minutes and attributed her decompensation to being in the cold air.   Acute on chronic systolic CHF (congestive heart failure), NYHA class 4 (HCC) Volume overloaded on exam  Nonischemic cardiomyopathy ([place]) Last EF 35-40% by echo Jan 2020  Sarcoidosis Work up and three weeks course of steroids by Dr Lamonte Sakai in Aug 2020.  She is on home O2  CKD (chronic kidney disease) stage 3, GFR 30-59 ml/min (HCC) Last GFR 47- July 2020  PLAN:    Increase Lasix- 80 mg now, 40 mg this pm.  Increase daily Lasix to 80 mg daily for 3 days, then go back to 60 mg daily.  Check BMP, CBC, and BNP.  Virtual f/u 7-10 days.    Medication  Adjustments/Labs and Tests Ordered: Current medicines are reviewed at length with the patient today.  Concerns regarding medicines are outlined above.  No orders of the defined types were placed in this encounter.  No orders of the defined types were placed in this encounter.   There are no Patient Instructions on file for this visit.   Signed, Kerin Ransom, PA-C  10/20/2019 10:06 AM    [facility]

## 2019-10-20 NOTE — Assessment & Plan Note (Signed)
Pt presented to the office today in respiratory distress.  She declined admission.  She did settle down after 5-10 minutes and attributed her decompensation to being in the cold air.

## 2019-10-20 NOTE — Patient Instructions (Addendum)
Medication Instructions:  TAKE 2 (TWO) 40MG  TABLETS OF LASIX WHEN YOU GET HOME TODAY AFTER 3PM TODAY ANOTHER 40MG  TABLET OF LASIX STARTING TOMORROW TAKE 2(TWO) TABLETS OF LASIX IN THE MORNING FOR THE NEXT 3 (THREE) DAYS  THEN GO BACK TO TAKE 60MG  (1.5 TABLETS) ONCE A DAY  *If you need a refill on your cardiac medications before your next appointment, please call your pharmacy*  Lab Work: Your physician recommends that you return for lab work in: TODAY-BNP, BMET, CBC If you have labs (blood work) drawn today and your tests are completely normal, you will receive your results only by: Marland Kitchen MyChart Message (if you have MyChart) OR . A paper copy in the mail If you have any lab test that is abnormal or we need to change your treatment, we will call you to review the results.  Testing/Procedures: NONE   Follow-Up: At [facility], you and your health needs are our priority.  As part of our continuing mission to provide you with exceptional heart care, we have created designated Provider Care Teams.  These Care Teams include your primary Cardiologist (physician) and Advanced Practice Providers (APPs -  Physician Assistants and Nurse Practitioners) who all work together to provide you with the care you need, when you need it.  Your next appointment:   2 week(s)  The format for your next appointment:   Virtual Visit   Provider:   Kerin Ransom, PA-C  Other Instructions I WILL CALL YOU 15 (FIFTEEN) MINUTES BEFORE YOUR NEXT APPT TO GET YOU READY FOR YOUR VIRTUAL VISIT. CHECK YOUR BLOOD PRESSURE AND YOUR WEIGHT BEFORE YOUR APPT TIME.

## 2019-10-20 NOTE — Assessment & Plan Note (Signed)
Last EF 35-40% by echo Jan 2020

## 2019-10-20 NOTE — Assessment & Plan Note (Signed)
Work up and three weeks course of steroids by Dr Lamonte Sakai in Aug 2020.  She is on home O2

## 2019-10-20 NOTE — Telephone Encounter (Signed)
PATIENT SEEN IN OFFICE TODAY WITH [facility]. FOLLOW UP APPT WILL BE VIRTUAL. EXPLAINED THE PROCESS TO THE PATIENT AND HER HUSBAND AND RECEIVED CONSENT FOR VIRTUAL VISIT.       Virtual Visit Pre-Appointment Phone Call  "(Name), I am calling you today to discuss your upcoming appointment. We are currently trying to limit exposure to the virus that causes COVID-19 by seeing patients at home rather than in the office."  1. "What is the BEST phone number to call the day of the visit?" - include this in appointment notes  2. "Do you have or have access to (through a family member/friend) a smartphone with video capability that we can use for your visit?" a. If yes - list this number in appt notes as "cell" (if different from BEST phone #) and list the appointment type as a VIDEO visit in appointment notes b. If no - list the appointment type as a PHONE visit in appointment notes  3. Confirm consent - "In the setting of the current Covid19 crisis, you are scheduled for a (phone or video) visit with your provider on (date) at (time).  Just as we do with many in-office visits, in order for you to participate in this visit, we must obtain consent.  If you'd like, I can send this to your mychart (if signed up) or email for you to review.  Otherwise, I can obtain your verbal consent now.  All virtual visits are billed to your insurance company just like a normal visit would be.  By agreeing to a virtual visit, we'd like you to understand that the technology does not allow for your provider to perform an examination, and thus may limit your provider's ability to fully assess your condition. If your provider identifies any concerns that need to be evaluated in person, we will make arrangements to do so.  Finally, though the technology is pretty good, we cannot assure that it will always work on either your or our end, and in the setting of a video visit, we may have to convert it to a phone-only visit.  In either  situation, we cannot ensure that we have a secure connection.  Are you willing to proceed?" STAFF: Did the patient verbally acknowledge consent to telehealth visit? Document YES/NO here: YES  4. Advise patient to be prepared - "Two hours prior to your appointment, go ahead and check your blood pressure, pulse, oxygen saturation, and your weight (if you have the equipment to check those) and write them all down. When your visit starts, your provider will ask you for this information. If you have an Apple Watch or Kardia device, please plan to have heart rate information ready on the day of your appointment. Please have a pen and paper handy nearby the day of the visit as well."  5. Give patient instructions for MyChart download to smartphone OR Doximity/Doxy.me as below if video visit (depending on what platform provider is using)  6. Inform patient they will receive a phone call 15 minutes prior to their appointment time (may be from unknown caller ID) so they should be prepared to answer    Crystal [name] has been deemed a candidate for a follow-up tele-health visit to limit community exposure during the Covid-19 pandemic. I spoke with the patient via phone to ensure availability of phone/video source, confirm preferred email & phone number, and discuss instructions and expectations.  I reminded Crystal [name] to be prepared with any  vital sign and/or heart rhythm information that could potentially be obtained via home monitoring, at the time of her visit. I reminded Crystal [name] to expect a phone call prior to her visit.  Harold Hedge, CMA 10/20/2019 10:22 AM   INSTRUCTIONS FOR DOWNLOADING THE MYCHART APP TO SMARTPHONE  - The patient must first make sure to have activated MyChart and know their login information - If Apple, go to [employer] and type in MyChart in the search bar and download the app. If Android, ask patient to go to [employer] and type in  [place] in the search bar and download the app. The app is free but as with any other app downloads, their phone may require them to verify saved payment information or Apple/Android password.  - The patient will need to then log into the app with their MyChart username and password, and select [place] as their healthcare provider to link the account. When it is time for your visit, go to the MyChart app, find appointments, and click Begin Video Visit. Be sure to Select Allow for your device to access the Microphone and Camera for your visit. You will then be connected, and your provider will be with you shortly.  **If they have any issues connecting, or need assistance please contact MyChart service desk [phone])**  **If using a computer, in order to ensure the best quality for their visit they will need to use either of the following Internet Browsers: [employer], or Google Chrome**  IF USING DOXIMITY or DOXY.ME - The patient will receive a link just prior to their visit by text.     FULL LENGTH CONSENT FOR TELE-HEALTH VISIT   I hereby voluntarily request, consent and authorize [facility] and its employed or contracted physicians, physician assistants, nurse practitioners or other licensed health care professionals (the Practitioner), to provide me with telemedicine health care services (the "Services") as deemed necessary by the treating Practitioner. I acknowledge and consent to receive the Services by the Practitioner via telemedicine. I understand that the telemedicine visit will involve communicating with the Practitioner through live audiovisual communication technology and the disclosure of certain medical information by electronic transmission. I acknowledge that I have been given the opportunity to request an in-person assessment or other available alternative prior to the telemedicine visit and am voluntarily participating in the telemedicine visit.  I  understand that I have the right to withhold or withdraw my consent to the use of telemedicine in the course of my care at any time, without affecting my right to future care or treatment, and that the Practitioner or I may terminate the telemedicine visit at any time. I understand that I have the right to inspect all information obtained and/or recorded in the course of the telemedicine visit and may receive copies of available information for a reasonable fee.  I understand that some of the potential risks of receiving the Services via telemedicine include:  Marland Kitchen Delay or interruption in medical evaluation due to technological equipment failure or disruption; . Information transmitted may not be sufficient (e.g. poor resolution of images) to allow for appropriate medical decision making by the Practitioner; and/or  . In rare instances, security protocols could fail, causing a breach of personal health information.  Furthermore, I acknowledge that it is my responsibility to provide information about my medical history, conditions and care that is complete and accurate to the best of my ability. I acknowledge that Practitioner's advice, recommendations, and/or  decision may be based on factors not within their control, such as incomplete or inaccurate data provided by me or distortions of diagnostic images or specimens that may result from electronic transmissions. I understand that the practice of medicine is not an exact science and that Practitioner makes no warranties or guarantees regarding treatment outcomes. I acknowledge that I will receive a copy of this consent concurrently upon execution via email to the email address I last provided but may also request a printed copy by calling the office of [facility].    I understand that my insurance will be billed for this visit.   I have read or had this consent read to me. . I understand the contents of this consent, which adequately explains the  benefits and risks of the Services being provided via telemedicine.  . I have been provided ample opportunity to ask questions regarding this consent and the Services and have had my questions answered to my satisfaction. . I give my informed consent for the services to be provided through the use of telemedicine in my medical care  By participating in this telemedicine visit I agree to the above.

## 2019-10-21 LAB — CBC WITH DIFFERENTIAL/PLATELET
Basophils Absolute: 0 10*3/uL (ref 0.0–0.2)
Basos: 0 %
EOS (ABSOLUTE): 0.1 10*3/uL (ref 0.0–0.4)
Eos: 2 %
Hematocrit: 36 % (ref 34.0–46.6)
Hemoglobin: 11.4 g/dL (ref 11.1–15.9)
Immature Grans (Abs): 0 10*3/uL (ref 0.0–0.1)
Immature Granulocytes: 0 %
Lymphocytes Absolute: 0.5 10*3/uL — ABNORMAL LOW (ref 0.7–3.1)
Lymphs: 14 %
MCH: 30.1 pg (ref 26.6–33.0)
MCHC: 31.7 g/dL (ref 31.5–35.7)
MCV: 95 fL (ref 79–97)
Monocytes Absolute: 0.3 10*3/uL (ref 0.1–0.9)
Monocytes: 10 %
Neutrophils Absolute: 2.3 10*3/uL (ref 1.4–7.0)
Neutrophils: 74 %
Platelets: 100 10*3/uL — CL (ref 150–450)
RBC: 3.79 x10E6/uL (ref 3.77–5.28)
RDW: 12.8 % (ref 11.7–15.4)
WBC: 3.2 10*3/uL — ABNORMAL LOW (ref 3.4–10.8)

## 2019-10-21 LAB — BASIC METABOLIC PANEL
BUN/Creatinine Ratio: 21 (ref 12–28)
BUN: 34 mg/dL — ABNORMAL HIGH (ref 8–27)
CO2: 26 mmol/L (ref 20–29)
Calcium: 9.8 mg/dL (ref 8.7–10.3)
Chloride: 98 mmol/L (ref 96–106)
Creatinine, Ser: 1.64 mg/dL — ABNORMAL HIGH (ref 0.57–1.00)
GFR calc Af Amer: 33 mL/min/{1.73_m2} — ABNORMAL LOW (ref >59)
GFR calc non Af Amer: 29 mL/min/{1.73_m2} — ABNORMAL LOW (ref >59)
Glucose: 109 mg/dL — ABNORMAL HIGH (ref 65–99)
Potassium: 4.6 mmol/L (ref 3.5–5.2)
Sodium: 139 mmol/L (ref 134–144)

## 2019-10-21 LAB — PRO B NATRIURETIC PEPTIDE: NT-Pro BNP: 13688 pg/mL — ABNORMAL HIGH (ref 0–738)

## 2019-10-23 ENCOUNTER — Telehealth: Payer: Self-pay | Admitting: Cardiology

## 2019-10-23 NOTE — Telephone Encounter (Signed)
Spoke with pt regarding lab work, she is concerned about her blood sugar and is going to cut back on sweets. After review of lab work discussed heart failure symptoms with patient. She did not increase the furosemide as directed at visit on Friday, she only took an extra 40 mg on Friday evening. Patient reports SOB and edema that has not changed and has not gotten any worse. She was instructed to go to the ER but she prefers not to. Patient instructed to take an extra 40 mg of furosemide now and then 80 mg for the next 3 mornings. She was instructed by Wednesday if no change to call or go to the ER. Patient voiced understanding that if her symptoms do not improve she needs to go to the ER. Fluid and sodium restrictions discussed.

## 2019-10-23 NOTE — Telephone Encounter (Signed)
Patient is calling wanting to know if she is Diabetic & also has some questions about her results. Please Advise.

## 2019-10-27 ENCOUNTER — Ambulatory Visit (INDEPENDENT_AMBULATORY_CARE_PROVIDER_SITE_OTHER): Payer: Medicare Other | Admitting: Emergency Medicine

## 2019-10-27 ENCOUNTER — Encounter: Payer: Self-pay | Admitting: Emergency Medicine

## 2019-10-27 ENCOUNTER — Other Ambulatory Visit: Payer: Self-pay

## 2019-10-27 DIAGNOSIS — D869 Sarcoidosis, unspecified: Secondary | ICD-10-CM | POA: Diagnosis not present

## 2019-10-27 NOTE — Progress Notes (Signed)
Virtual Visit via Telephone Note  I connected with Crystal [name] on 10/27/19 at  1:45 PM EST by telephone and verified that I am speaking with the correct person using two identifiers.  Location: Patient: Home Provider: Office   I discussed the limitations, risks, security and privacy concerns of performing an evaluation and management service by telephone and the availability of in person appointments. I also discussed with the patient that there may be a patient responsible charge related to this service. The patient expressed understanding and agreed to proceed.   History of Present Illness: 85 year old woman with a history of B-cell lymphoma (in remission), a nonischemic cardiomyopathy with hypertension and hypothyroidism.  Mediastinoscopy with noncaseating granulomas consistent with sarcoidosis.  I treated her with 3 weeks of prednisone, completed in September.  CT chest done 08/07/2019 showed a decrease in size of her left upper lobe cavitary mass, persistent bilateral upper lung predominant calcified mediastinal hilar lymphadenopathy and groundglass attenuation.  Stable scattered nodules.  She has moderate to large bilateral pleural effusions.    Observations/Objective: She has been dealing with progressive LE edema since about 1 week ago. She was seen at cardiology with evidence for volume overload > her diuretics were adjusted and she states that her breathing is much improved. She still has some LE edema. She is wearing 2L/min O2. She has cough - happens at night, propping up her head has helped. She occasionally uses tessalon. Minimal sputum.   Assessment and Plan: Acute on chronic hypoxemic resp failure, appears to be decompensated CHF. Improving w diuresis.  - continue o2 as ordered  CM with combined CHF and volume overload, improving with diuretics. Note B effusions, suspect these are cardiogenic transudates.  - follow imaging, / whether thoracentesis will become necessary.  Should resolve w diuresis.   Pulmonary sarcoidosis - needs PFT at some point going forward.  - discussed using albuterol  - plan to repeat Ct chest in March  Follow Up Instructions: March after CT chest    I discussed the assessment and treatment plan with the patient. The patient was provided an opportunity to ask questions and all were answered. The patient agreed with the plan and demonstrated an understanding of the instructions.   The patient was advised to call back or seek an in-person evaluation if the symptoms worsen or if the condition fails to improve as anticipated.  I provided 15 minutes of non-face-to-face time during this encounter.   Collene Gobble, MD

## 2019-11-02 ENCOUNTER — Other Ambulatory Visit: Payer: Self-pay | Admitting: Family Medicine

## 2019-11-03 ENCOUNTER — Telehealth: Payer: Self-pay

## 2019-11-03 ENCOUNTER — Telehealth (INDEPENDENT_AMBULATORY_CARE_PROVIDER_SITE_OTHER): Payer: Medicare Other | Admitting: Cardiology

## 2019-11-03 ENCOUNTER — Encounter: Payer: Self-pay | Admitting: Cardiology

## 2019-11-03 VITALS — BP 97/73 | HR 68 | Ht 63.0 in | Wt 135.0 lb

## 2019-11-03 DIAGNOSIS — J9621 Acute and chronic respiratory failure with hypoxia: Secondary | ICD-10-CM

## 2019-11-03 DIAGNOSIS — I5023 Acute on chronic systolic (congestive) heart failure: Secondary | ICD-10-CM

## 2019-11-03 DIAGNOSIS — I428 Other cardiomyopathies: Secondary | ICD-10-CM

## 2019-11-03 DIAGNOSIS — D869 Sarcoidosis, unspecified: Secondary | ICD-10-CM

## 2019-11-03 DIAGNOSIS — N1831 Chronic kidney disease, stage 3a: Secondary | ICD-10-CM

## 2019-11-03 NOTE — Patient Instructions (Signed)
Medication Instructions:  INCREASE Lasix to 80mg  for 3 days then go back to 60mg  dose *If you need a refill on your cardiac medications before your next appointment, please call your pharmacy*  Lab Work: Your physician recommends that you return for lab work in: on Tuesday (11/07/2019)-BMET, BNP If you have labs (blood work) drawn today and your tests are completely normal, you will receive your results only by: Marland Kitchen MyChart Message (if you have MyChart) OR . A paper copy in the mail If you have any lab test that is abnormal or we need to change your treatment, we will call you to review the results.  Testing/Procedures: NONE   Follow-Up: At [facility], you and your health needs are our priority.  As part of our continuing mission to provide you with exceptional heart care, we have created designated Provider Care Teams.  These Care Teams include your primary Cardiologist (physician) and Advanced Practice Providers (APPs -  Physician Assistants and Nurse Practitioners) who all work together to provide you with the care you need, when you need it.  Your next appointment:   2-3 week(s)  The format for your next appointment:   Virtual Visit   Provider:   Kerin Ransom, PA-C  Other Instructions

## 2019-11-03 NOTE — Progress Notes (Signed)
Virtual Visit via Telephone Note   This visit type was conducted due to national recommendations for restrictions regarding the COVID-19 Pandemic (e.g. social distancing) in an effort to limit this patient's exposure and mitigate transmission in our community.  Due to her co-morbid illnesses, this patient is at least at moderate risk for complications without adequate follow up.  This format is felt to be most appropriate for this patient at this time.  The patient did not have access to video technology/had technical difficulties with video requiring transitioning to audio format only (telephone).  All issues noted in this document were discussed and addressed.  No physical exam could be performed with this format.  Please refer to the patient's chart for her  consent to telehealth for [facility].   Date:  11/03/2019   ID:  Crystal [name], DOB [birth year], MRN [id number]  Patient Location: Home Provider Location: Office  PCP:  Crystal Lukes, MD  Cardiologist:  Crystal Ruths, MD  Electrophysiologist:  None   Evaluation Performed:  Follow-Up Visit  Chief Complaint:  Edema   History of Present Illness:    Crystal [name] is a 85 y.o. female with a hx of NICM- EF 15-20% in 2000.  She declined an ICD.  Her last echocardiogram was in January 2020 which showed an ejection fraction of 35 to 40% with grade 2 diastolic dysfunction.  She is also been diagnosed with pulmonary sarcoid.  She had angioedema with an ACE inhibitor and could not tolerate hydralazine or nitrates.  I saw her in the office October 20, 2019.  When she arrived in the office she was acutely short of breath and hypoxic.  I offered admission to the hospital.  She declined.  After 5 or 10 minutes she calmed down and her O2 sat came up.  The patient was volume overloaded.  I inked creased her Lasix to 80 mg daily for 3 days.  She was previously taking 40 mg a day.  She actually did not do this for a few days, I am not sure why.   She called the office again complaining of shortness of breath and swelling and was instructed to go to the emergency room.  She declined.  At that time she did increase her Lasix to 80 mg daily for 3 days and did have some improvement in her shortness of breath.  She has pulmonary sarcoid and saw Dr. Lamonte [name] 10/27/2019.  She was stable at that time but he felt she still had some volume overload.  Labs from 10/20/2019 show she had a proBNP of 13,000.  The patient was contacted virtually today for follow-up.  She was not short of breath on the phone and says she has been doing much better.  She has been weighing herself at home.  Her weight is still up 10 to 15 pounds from her baseline and she still has edema.  Her shortness of breath is still present but much improved.  As noted she was able to carry on a relaxed conversation over the phone.    The patient does not have symptoms concerning for COVID-19 infection (fever, chills, cough, or new shortness of breath).    Past Medical History:  Diagnosis Date  . Anemia 08/21/2017  . Anxiety   . Cardiomyopathy    non ischemic NL cos on cath 08/2009, EF to 15-20%, Her follow up  2D echo  in 10/2009 showed impoved EFo 35-40%  . CHF (congestive heart failure) ([place])   .  Cough   . Depression   . Dyspnea   . Edema of leg   . Full dentures   . Hypercalcemia 04/20/2017  . Hypertension   . Hypothyroid   . Lung mass   . Medicare annual wellness visit, subsequent 12/14/2014   Follows with Dr Marin Olp Follows with Dr Deatra Ina, gastroenterology, last colonoscopy in 2012, repeat in 2017 Last Pap 2011, always normal, no need for repeat Last MGM in 2011, no concerns, declines for now Follows with Dr Stanford Breed of cardiology   . Non Hodgkin's lymphoma ([place]) 11/17/1999   -Dr. Marin Olp  . Pleural effusion   . Vitamin D deficiency 10/17/2015  . Wears glasses    Past Surgical History:  Procedure Laterality Date  . DILATION AND CURETTAGE OF UTERUS    . MEDIASTINOSCOPY N/A  05/24/2019   Procedure: MEDIASTINOSCOPY;  Surgeon: Melrose Nakayama, MD;  Location: [facility];  Service: Thoracic;  Laterality: N/A;  . MULTIPLE TOOTH EXTRACTIONS    . NASAL POLYP EXCISION  1962  . TONSILLECTOMY  1952  . VIDEO BRONCHOSCOPY WITH ENDOBRONCHIAL NAVIGATION Right 06/29/2018   Procedure: VIDEO BRONCHOSCOPY WITH ENDOBRONCHIAL NAVIGATION;  Surgeon: Collene Gobble, MD;  Location: [place];  Service: Thoracic;  Laterality: Right;  Marland Kitchen VIDEO BRONCHOSCOPY WITH ENDOBRONCHIAL NAVIGATION N/A 05/24/2019   Procedure: VIDEO BRONCHOSCOPY WITH ENDOBRONCHIAL NAVIGATION;  Surgeon: Melrose Nakayama, MD;  Location: [place];  Service: Thoracic;  Laterality: N/A;  . VIDEO BRONCHOSCOPY WITH ENDOBRONCHIAL ULTRASOUND Right 06/29/2018   Procedure: VIDEO BRONCHOSCOPY WITH ENDOBRONCHIAL ULTRASOUND;  Surgeon: Collene Gobble, MD;  Location: [place];  Service: Thoracic;  Laterality: Right;  Marland Kitchen VIDEO BRONCHOSCOPY WITH ENDOBRONCHIAL ULTRASOUND N/A 05/24/2019   Procedure: VIDEO BRONCHOSCOPY WITH ENDOBRONCHIAL ULTRASOUND;  Surgeon: Melrose Nakayama, MD;  Location: [facility] OR;  Service: Thoracic;  Laterality: N/A;     Current Meds  Medication Sig  . acetaminophen (TYLENOL) 325 MG tablet Take 2 tablets (650 mg total) by mouth every 6 (six) hours as needed for fever, headache, mild pain or moderate pain.  Marland Kitchen albuterol (VENTOLIN HFA) 108 (90 Base) MCG/ACT inhaler INHALE 1 TO 2 PUFFS BY MOUTH EVERY 4 HOURS AS NEEDED FOR WHEEZING FOR SHORTNESS OF BREATH  . ALPRAZolam (XANAX) 0.25 MG tablet Take 1 tablet by mouth twice daily as needed for anxiety (Patient taking differently: Take 0.25 mg by mouth 2 (two) times daily as needed for anxiety. )  . benzonatate (TESSALON) 100 MG capsule TAKE 1 TO 2 CAPSULES BY MOUTH THREE TIMES DAILY AS NEEDED FOR COUGH  . carvedilol (COREG) 3.125 MG tablet Take 1 tablet (3.125 mg total) by mouth every morning.  . furosemide (LASIX) 40 MG tablet Take 1.5 tablets (60 mg total) by mouth daily. TAKE AS  DIRECTED  . levothyroxine (SYNTHROID) 50 MCG tablet Take 1 tablet (50 mcg total) by mouth daily.  . magnesium hydroxide (MILK OF MAGNESIA) 400 MG/5ML suspension Take 15 mLs by mouth daily as needed for mild constipation.  . Multiple Vitamins-Minerals (CENTRUM PO) Take 1 tablet by mouth daily.   . Omega-3 Fatty Acids (FISH OIL) 1000 MG CAPS Take 1,000 mg by mouth daily.  . Probiotic Product (PROBIOTIC DAILY PO) Take 1 capsule by mouth daily.   Marland Kitchen Spacer/Aero-Holding Chambers (AEROCHAMBER MV) inhaler Use as instructed     Allergies:   Lisinopril, Hydralazine hcl, and Iohexol   Social History   Tobacco Use  . Smoking status: Never Smoker  . Smokeless tobacco: Never Used  Substance Use Topics  . Alcohol use:  Never    Alcohol/week: 0.0 standard drinks  . Drug use: Never     Family Hx: The patient's family history includes Arthritis in her sister, sister, and sister; Cancer in her brother; Colon cancer (age of onset: 59) in her brother; Colon polyps in her father; Diabetes in her brother, mother, sister, sister, sister, and sister; Hypertension in her daughter, daughter, and mother; Kidney disease in her brother; Liver disease in her daughter; Obesity in her son; Other in an other family member; Pneumonia in her father; Sarcoidosis in her daughter and sister; Vision loss in her mother. There is no history of Alcohol abuse or Heart disease.  ROS:   Please see the history of present illness.    All other systems reviewed and are negative.   Prior CV studies:   The following studies were reviewed today: Chest CT w/o contrast 08/07/2019 Echo 11/30/2018  Labs/Other Tests and Data Reviewed:    EKG:  An ECG dated 10/20/2019 was personally reviewed today and demonstrated:  NSR, 1st AVB, RBBB, PACs  Recent Labs: 01/12/2019: B Natriuretic Peptide 3,955.3 05/29/2019: Magnesium 2.1; TSH 4.97 06/28/2019: ALT 55 10/20/2019: BUN 34; Creatinine, Ser 1.64; Hemoglobin 11.4; NT-Pro BNP 13,688; Platelets  100; Potassium 4.6; Sodium 139   Recent Lipid Panel Lab Results  Component Value Date/Time   CHOL 136 05/29/2019 09:46 AM   TRIG 65.0 05/29/2019 09:46 AM   HDL 51.60 05/29/2019 09:46 AM   CHOLHDL 3 05/29/2019 09:46 AM   LDLCALC 72 05/29/2019 09:46 AM    Wt Readings from Last 3 Encounters:  11/03/19 135 lb (61.2 kg)  10/20/19 128 lb (58.1 kg)  07/18/19 112 lb (50.8 kg)     Objective:    Vital Signs:  BP 97/73   Pulse 68   Ht 5\' 3"  (1.6 m)   Wt 135 lb (61.2 kg)   BMI 23.91 kg/m    VITAL SIGNS:  reviewed  ASSESSMENT & PLAN:    Acute on chronic systolic CHF (congestive heart failure), NYHA class 4 ([place]) She is still volume overloaded based on her weight and description of LE edema   Nonischemic cardiomyopathy ([place]) Last EF 35-40% by echo Jan 2020  Sarcoidosis Work up and three weeks course of steroids by Dr Crystal [name] in Aug 2020.  She is on home O2  CKD (chronic kidney disease) stage 3, GFR 30-59 ml/min (HCC) Last GFR 33-12/02/2019  Plan: Increase Lasix to 80 mg daily for three days.  Check BNP and BMP next week.  Daily weights.  Virtual f/u 2-3 weeks.   COVID-19 Education: The signs and symptoms of COVID-19 were discussed with the patient and how to seek care for testing (follow up with PCP or arrange E-visit).  The importance of social distancing was discussed today.  Time:   Today, I have spent 15 minutes with the patient with telehealth technology discussing the above problems.     Medication Adjustments/Labs and Tests Ordered: Current medicines are reviewed at length with the patient today.  Concerns regarding medicines are outlined above.   Tests Ordered: No orders of the defined types were placed in this encounter.   Medication Changes: No orders of the defined types were placed in this encounter.   Follow Up:  Virtual Visit  2-3 weeks with me.   Angelena Form, PA-C  11/03/2019 9:34 AM    [facility]

## 2019-11-03 NOTE — Telephone Encounter (Signed)
Contacted patient to discuss AVS Instructions. Gave patient Luke's recommendations from today's virtual office visit. Schedule her follow up virtual appt. Patient voiced understanding and AVS mailed.

## 2019-11-03 NOTE — Telephone Encounter (Signed)
Contacted patient to discuss AVS Instructions. Gave patient Luke's recommendations from today's virtual office visit. Scheduled her follow up appointment. Patient voiced understanding and AVS mailed.

## 2019-11-07 ENCOUNTER — Other Ambulatory Visit: Payer: Self-pay | Admitting: Cardiology

## 2019-11-08 LAB — PRO B NATRIURETIC PEPTIDE: NT-Pro BNP: 12815 pg/mL — ABNORMAL HIGH (ref 0–738)

## 2019-11-13 ENCOUNTER — Other Ambulatory Visit: Payer: Self-pay | Admitting: Family Medicine

## 2019-11-16 LAB — BASIC METABOLIC PANEL
BUN/Creatinine Ratio: 22 (ref 12–28)
BUN: 38 mg/dL — ABNORMAL HIGH (ref 8–27)
CO2: 16 mmol/L — ABNORMAL LOW (ref 20–29)
Calcium: 10.4 mg/dL — ABNORMAL HIGH (ref 8.7–10.3)
Chloride: 102 mmol/L (ref 96–106)
Creatinine, Ser: 1.7 mg/dL — ABNORMAL HIGH (ref 0.57–1.00)
GFR calc Af Amer: 32 mL/min/{1.73_m2} — ABNORMAL LOW (ref >59)
GFR calc non Af Amer: 27 mL/min/{1.73_m2} — ABNORMAL LOW (ref >59)
Glucose: 108 mg/dL — ABNORMAL HIGH (ref 65–99)
Potassium: 4.4 mmol/L (ref 3.5–5.2)
Sodium: 145 mmol/L — ABNORMAL HIGH (ref 134–144)

## 2019-11-20 IMAGING — CT CT CHEST W/O CM
2 of 3 series · 15 of 36 positions shown, 18 images · non-contrast
Comparison: 02/02/2019.

CLINICAL DATA: Evaluate for sarcoid.

EXAM:
CT CHEST WITHOUT CONTRAST
TECHNIQUE: Multidetector CT imaging of the chest was performed following the
standard protocol without IV contrast.

[Series 2: thorax · axial · 0.59mm/px · z∈[-274,-22]mm · 12 of 148 slices shown, 15 images]
[im 11/148  mediastinal]
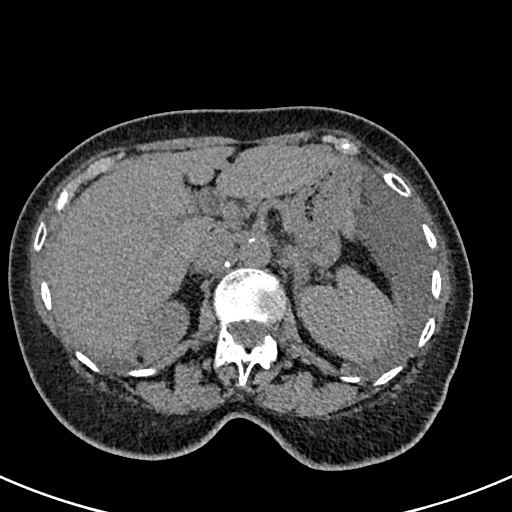
[im 11/148  lung]
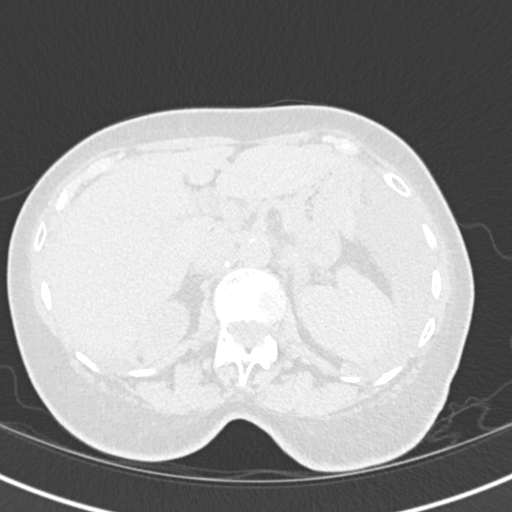
[im 22/148  lung]
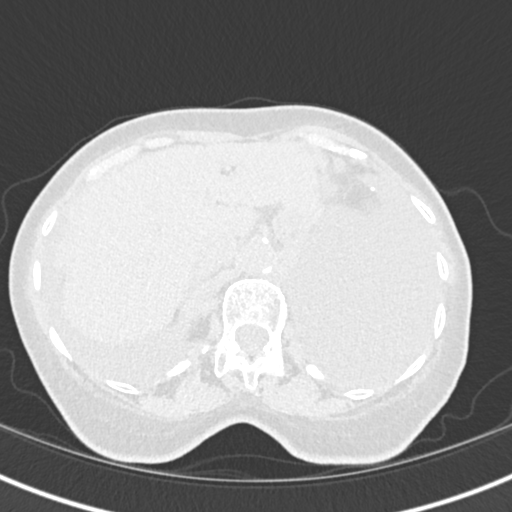
[im 33/148  lung]
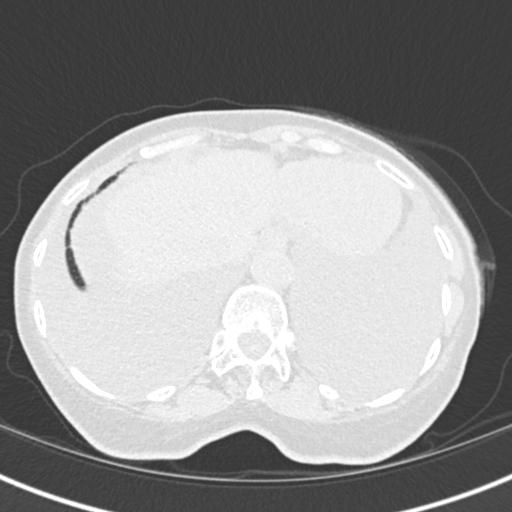
[im 44/148  lung]
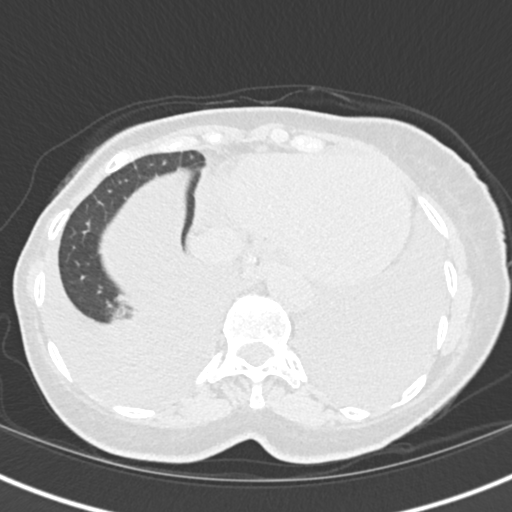
[im 55/148  mediastinal]
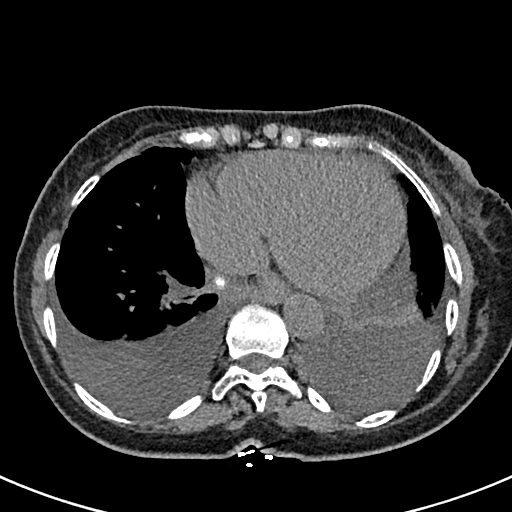
[im 55/148  lung]
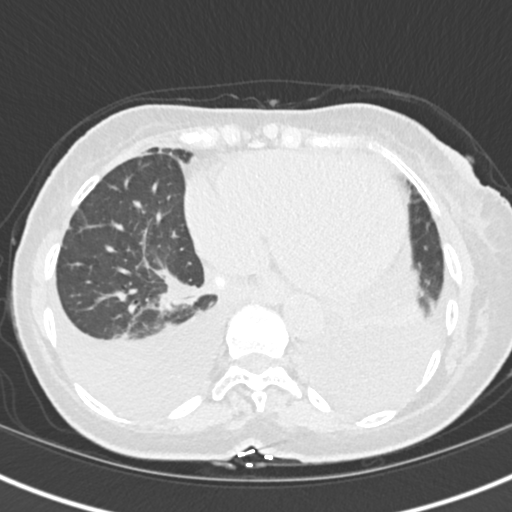
[im 66/148  lung]
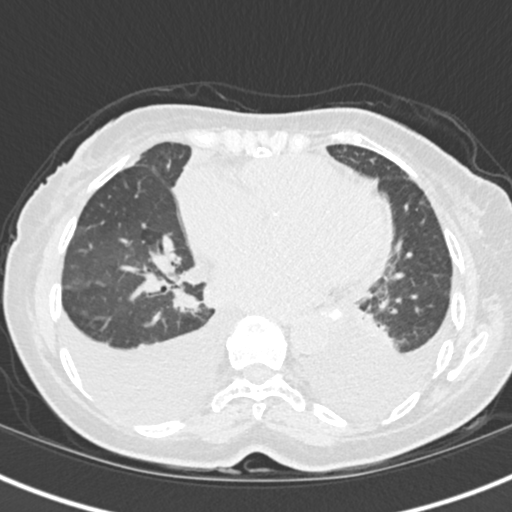
[im 82/148  lung]
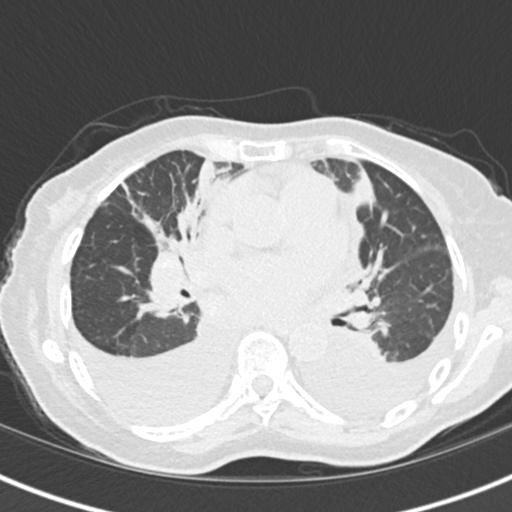
[im 93/148  lung]
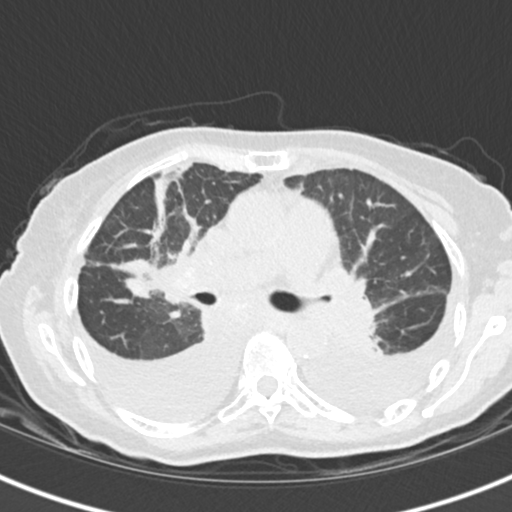
[im 104/148  mediastinal]
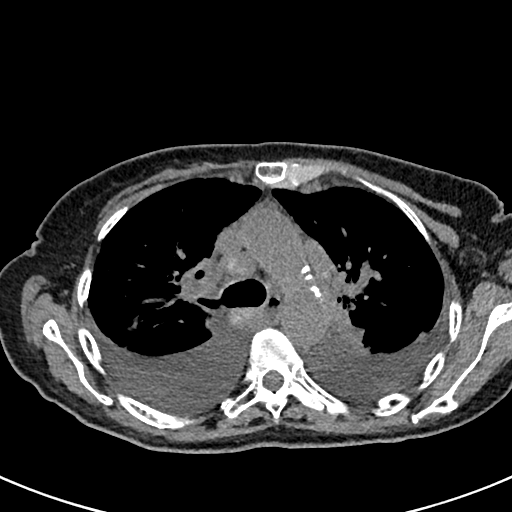
[im 104/148  lung]
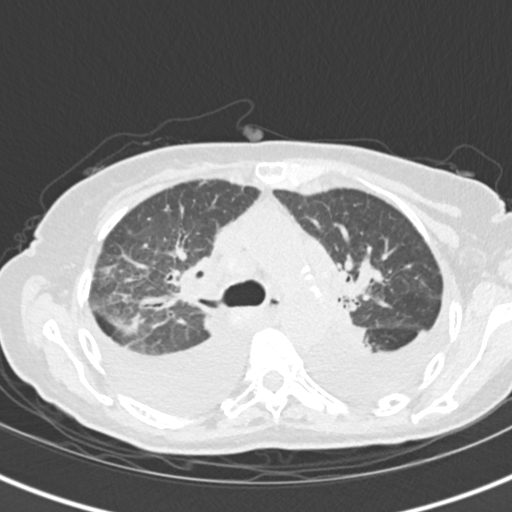
[im 115/148  lung]
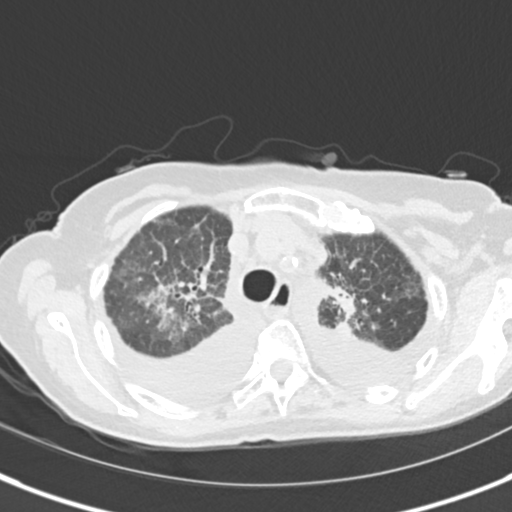
[im 126/148  lung]
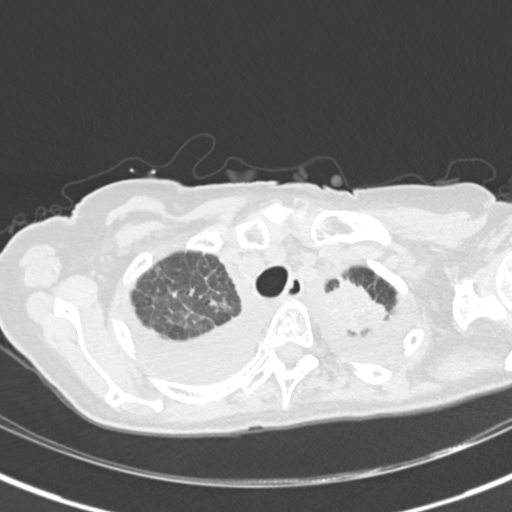
[im 137/148  lung]
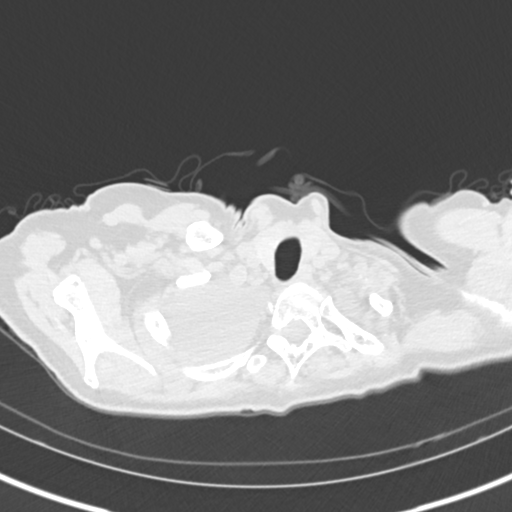

[Series 5: coronal · coronal · 0.59mm/px · 3 of 104 slices shown]
[im 21/104  lung]
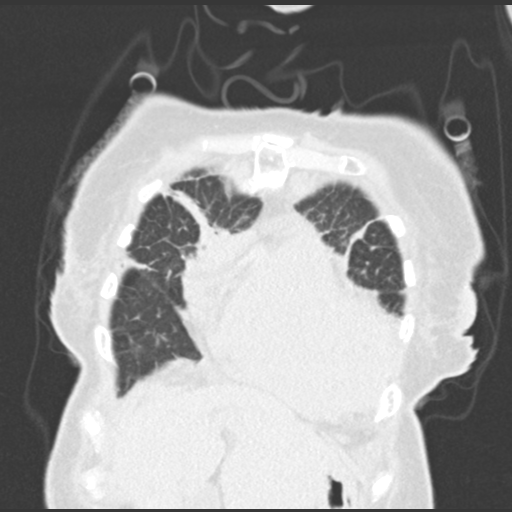
[im 42/104  lung]
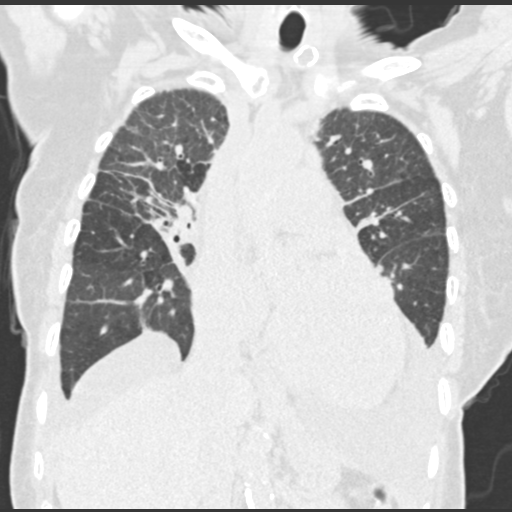
[im 62/104  lung]
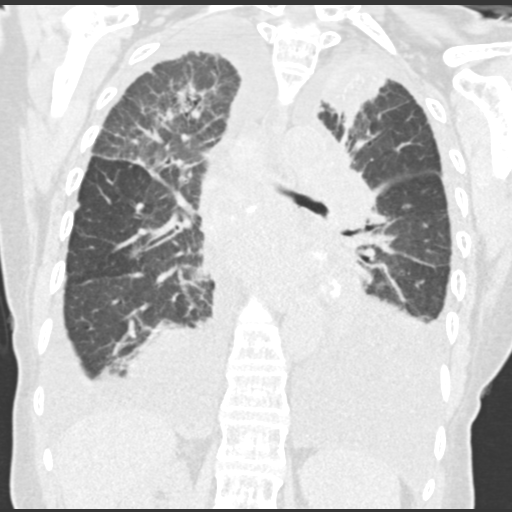

[15 of 36 positions shown; findings below may reference images not displayed]

FINDINGS: Cardiovascular: The heart size is mildly enlarged. No pericardial
effusion. Aortic atherosclerosis. Left circumflex coronary artery
calcifications.

Mediastinum/Nodes: Unchanged left lobe of thyroid gland calcified
nodule. The trachea appears patent and is midline. Normal appearance
of the esophagus. Again seen is enlarged and partially calcified
mediastinal and hilar lymph nodes. Right paratracheal lymph node
measures 1.3 cm, image 52/2. Unchanged. Subcarinal lymph node
measures 1.9 cm, image 60/2. Unchanged. Right hilar lymph node
measures 1.8 cm, image 66/2. Previously 1.9 cm. Left pre-vascular
lymph node measures 1.1 cm, image 46/2. Unchanged.

Lungs/Pleura: Again seen is bilateral upper lung zone predominant
ground-glass attenuation and thickening of the peribronchovascular
interstitium. Perifissural nodularity is again noted bilaterally.
The previously noted left upper lobe cavitary mass he no longer
appears cavitary and is decreased in size when compared with
previous exam. Currently 3.3 x 2.5 cm, image [DATE]. Previously 3.6 x
2.9 cm. The similar appearance of subpleural and perifissural nodule
within the left midlung measuring 1.1 cm, image 63/3. Subpleural
nodule within the anterolateral right lower lobe is unchanged
measuring 3 mm. Posterior right middle lobe perifissural nodule
measures 2.5 x 1.1 cm, image 56/3. Previously 2.6 x 1.2 cm. Nodule
within superior segment of right lower lobe anteriorly measures
x 0.9 cm, image 56/3. Previously 1.5 x 0.9 cm.

Upper Abdomen: No acute findings identified within the imaged
portions of the upper abdomen.

Musculoskeletal: No acute or suspicious osseous findings. Mild
curvature of the thoracic spine is convex towards the right.
IMPRESSION: 1. Similar appearance of bilateral upper lung zone predominant
partially calcified mediastinal and hilar adenopathy, perilymphatic
nodularity, ground-glass attenuation, and thickening of the
peribronchovascular interstitium. Spectrum compatible with the
suspected diagnosis of sarcoid.
2. The cavitary lung mass within the left upper lobe is no longer
cavitary and appears decreased in size from previous exam.
3. Posterior right middle lobe and anterior right upper lobe
perifissural nodules are stable in the interval.
4. Persistent bilateral pleural effusions
5. Aortic Atherosclerosis ([id number]). Coronary artery
calcifications.

## 2019-11-21 ENCOUNTER — Inpatient Hospital Stay (HOSPITAL_COMMUNITY)
Admission: EM | Admit: 2019-11-21 | Discharge: 2019-11-30 | DRG: 291 | Disposition: A | Discharge status: Home Health | Payer: Medicare Other | Attending: Cardiology | Admitting: Cardiology

## 2019-11-21 ENCOUNTER — Encounter (HOSPITAL_COMMUNITY): Payer: Self-pay | Admitting: Cardiology

## 2019-11-21 ENCOUNTER — Other Ambulatory Visit: Payer: Self-pay

## 2019-11-21 ENCOUNTER — Emergency Department (HOSPITAL_COMMUNITY): Payer: Medicare Other

## 2019-11-21 ENCOUNTER — Telehealth: Payer: Self-pay

## 2019-11-21 ENCOUNTER — Encounter (INDEPENDENT_AMBULATORY_CARE_PROVIDER_SITE_OTHER): Payer: Self-pay

## 2019-11-21 ENCOUNTER — Telehealth: Payer: Medicare Other | Admitting: Physician Assistant

## 2019-11-21 ENCOUNTER — Encounter: Payer: Self-pay | Admitting: Physician Assistant

## 2019-11-21 ENCOUNTER — Ambulatory Visit (INDEPENDENT_AMBULATORY_CARE_PROVIDER_SITE_OTHER): Payer: Medicare Other | Admitting: Physician Assistant

## 2019-11-21 VITALS — BP 117/80 | HR 76 | Ht 63.0 in | Wt 152.0 lb

## 2019-11-21 DIAGNOSIS — R0682 Tachypnea, not elsewhere classified: Secondary | ICD-10-CM

## 2019-11-21 DIAGNOSIS — I130 Hypertensive heart and chronic kidney disease with heart failure and stage 1 through stage 4 chronic kidney disease, or unspecified chronic kidney disease: Secondary | ICD-10-CM | POA: Diagnosis not present

## 2019-11-21 DIAGNOSIS — I509 Heart failure, unspecified: Secondary | ICD-10-CM

## 2019-11-21 DIAGNOSIS — I4729 Other ventricular tachycardia: Secondary | ICD-10-CM

## 2019-11-21 DIAGNOSIS — N1831 Chronic kidney disease, stage 3a: Secondary | ICD-10-CM | POA: Diagnosis present

## 2019-11-21 DIAGNOSIS — F329 Major depressive disorder, single episode, unspecified: Secondary | ICD-10-CM | POA: Diagnosis present

## 2019-11-21 DIAGNOSIS — E559 Vitamin D deficiency, unspecified: Secondary | ICD-10-CM | POA: Diagnosis present

## 2019-11-21 DIAGNOSIS — F419 Anxiety disorder, unspecified: Secondary | ICD-10-CM | POA: Diagnosis present

## 2019-11-21 DIAGNOSIS — E873 Alkalosis: Secondary | ICD-10-CM | POA: Diagnosis not present

## 2019-11-21 DIAGNOSIS — J9620 Acute and chronic respiratory failure, unspecified whether with hypoxia or hypercapnia: Secondary | ICD-10-CM

## 2019-11-21 DIAGNOSIS — E871 Hypo-osmolality and hyponatremia: Secondary | ICD-10-CM | POA: Diagnosis not present

## 2019-11-21 DIAGNOSIS — Z91041 Radiographic dye allergy status: Secondary | ICD-10-CM

## 2019-11-21 DIAGNOSIS — Z79899 Other long term (current) drug therapy: Secondary | ICD-10-CM | POA: Diagnosis not present

## 2019-11-21 DIAGNOSIS — I10 Essential (primary) hypertension: Secondary | ICD-10-CM

## 2019-11-21 DIAGNOSIS — R0602 Shortness of breath: Secondary | ICD-10-CM | POA: Diagnosis not present

## 2019-11-21 DIAGNOSIS — E785 Hyperlipidemia, unspecified: Secondary | ICD-10-CM | POA: Diagnosis present

## 2019-11-21 DIAGNOSIS — I5043 Acute on chronic combined systolic (congestive) and diastolic (congestive) heart failure: Secondary | ICD-10-CM

## 2019-11-21 DIAGNOSIS — I428 Other cardiomyopathies: Secondary | ICD-10-CM

## 2019-11-21 DIAGNOSIS — E039 Hypothyroidism, unspecified: Secondary | ICD-10-CM

## 2019-11-21 DIAGNOSIS — Z833 Family history of diabetes mellitus: Secondary | ICD-10-CM

## 2019-11-21 DIAGNOSIS — I5021 Acute systolic (congestive) heart failure: Secondary | ICD-10-CM | POA: Diagnosis not present

## 2019-11-21 DIAGNOSIS — I472 Ventricular tachycardia: Secondary | ICD-10-CM | POA: Diagnosis not present

## 2019-11-21 DIAGNOSIS — I5023 Acute on chronic systolic (congestive) heart failure: Secondary | ICD-10-CM

## 2019-11-21 DIAGNOSIS — Z888 Allergy status to other drugs, medicaments and biological substances status: Secondary | ICD-10-CM | POA: Diagnosis not present

## 2019-11-21 DIAGNOSIS — I959 Hypotension, unspecified: Secondary | ICD-10-CM | POA: Diagnosis not present

## 2019-11-21 DIAGNOSIS — Z8249 Family history of ischemic heart disease and other diseases of the circulatory system: Secondary | ICD-10-CM | POA: Diagnosis not present

## 2019-11-21 DIAGNOSIS — R54 Age-related physical debility: Secondary | ICD-10-CM | POA: Diagnosis present

## 2019-11-21 DIAGNOSIS — Z79890 Hormone replacement therapy (postmenopausal): Secondary | ICD-10-CM

## 2019-11-21 DIAGNOSIS — N1832 Chronic kidney disease, stage 3b: Secondary | ICD-10-CM | POA: Diagnosis not present

## 2019-11-21 DIAGNOSIS — D860 Sarcoidosis of lung: Secondary | ICD-10-CM

## 2019-11-21 DIAGNOSIS — R05 Cough: Secondary | ICD-10-CM | POA: Diagnosis not present

## 2019-11-21 DIAGNOSIS — Z20822 Contact with and (suspected) exposure to covid-19: Secondary | ICD-10-CM | POA: Diagnosis present

## 2019-11-21 DIAGNOSIS — J9621 Acute and chronic respiratory failure with hypoxia: Secondary | ICD-10-CM | POA: Diagnosis present

## 2019-11-21 DIAGNOSIS — Z8572 Personal history of non-Hodgkin lymphomas: Secondary | ICD-10-CM | POA: Diagnosis not present

## 2019-11-21 DIAGNOSIS — I110 Hypertensive heart disease with heart failure: Secondary | ICD-10-CM | POA: Diagnosis not present

## 2019-11-21 DIAGNOSIS — I340 Nonrheumatic mitral (valve) insufficiency: Secondary | ICD-10-CM | POA: Diagnosis not present

## 2019-11-21 DIAGNOSIS — I361 Nonrheumatic tricuspid (valve) insufficiency: Secondary | ICD-10-CM | POA: Diagnosis not present

## 2019-11-21 LAB — MRSA PCR SCREENING: MRSA by PCR: NEGATIVE

## 2019-11-21 LAB — MAGNESIUM: Magnesium: 2.4 mg/dL (ref 1.7–2.4)

## 2019-11-21 LAB — BASIC METABOLIC PANEL
Anion gap: 11 (ref 5–15)
BUN: 33 mg/dL — ABNORMAL HIGH (ref 8–23)
CO2: 28 mmol/L (ref 22–32)
Calcium: 9.7 mg/dL (ref 8.9–10.3)
Chloride: 96 mmol/L — ABNORMAL LOW (ref 98–111)
Creatinine, Ser: 1.5 mg/dL — ABNORMAL HIGH (ref 0.44–1.00)
GFR calc Af Amer: 37 mL/min — ABNORMAL LOW (ref >60)
GFR calc non Af Amer: 32 mL/min — ABNORMAL LOW (ref >60)
Glucose, Bld: 95 mg/dL (ref 70–99)
Potassium: 4.5 mmol/L (ref 3.5–5.1)
Sodium: 135 mmol/L (ref 135–145)

## 2019-11-21 LAB — CBC
HCT: 41.2 % (ref 36.0–46.0)
Hemoglobin: 13 g/dL (ref 12.0–15.0)
MCH: 30.4 pg (ref 26.0–34.0)
MCHC: 31.6 g/dL (ref 30.0–36.0)
MCV: 96.3 fL (ref 80.0–100.0)
Platelets: 91 10*3/uL — ABNORMAL LOW (ref 150–400)
RBC: 4.28 MIL/uL (ref 3.87–5.11)
RDW: 15.9 % — ABNORMAL HIGH (ref 11.5–15.5)
WBC: 4.3 10*3/uL (ref 4.0–10.5)
nRBC: 0 % (ref 0.0–0.2)

## 2019-11-21 LAB — BRAIN NATRIURETIC PEPTIDE: B Natriuretic Peptide: 3536.1 pg/mL — ABNORMAL HIGH (ref 0.0–100.0)

## 2019-11-21 LAB — TSH: TSH: 4.484 u[IU]/mL (ref 0.350–4.500)

## 2019-11-21 LAB — SARS CORONAVIRUS 2 (TAT 6-24 HRS): SARS Coronavirus 2: NEGATIVE

## 2019-11-21 MED ORDER — ASPIRIN EC 81 MG PO TBEC
81.0000 mg | DELAYED_RELEASE_TABLET | Freq: Every day | ORAL | Status: DC
  Administered 2019-11-21 – 2019-11-30 (×10): 81 mg via ORAL
  Filled 2019-11-21 (×10): qty 1

## 2019-11-21 MED ORDER — ALPRAZOLAM 0.25 MG PO TABS
0.2500 mg | ORAL_TABLET | Freq: Two times a day (BID) | ORAL | Status: DC | PRN
  Filled 2019-11-21: qty 1

## 2019-11-21 MED ORDER — ANIMAL SHAPES WITH C & FA PO CHEW
1.0000 | CHEWABLE_TABLET | Freq: Every day | ORAL | Status: DC
  Filled 2019-11-21: qty 1

## 2019-11-21 MED ORDER — ADULT MULTIVITAMIN W/MINERALS CH
1.0000 | ORAL_TABLET | Freq: Every day | ORAL | Status: DC
  Administered 2019-11-21 – 2019-11-30 (×10): 1 via ORAL
  Filled 2019-11-21 (×10): qty 1

## 2019-11-21 MED ORDER — SODIUM CHLORIDE 0.9% FLUSH: 3.0000 mL | INTRAVENOUS | Status: DC | PRN

## 2019-11-21 MED ORDER — ACETAMINOPHEN 325 MG PO TABS
650.0000 mg | ORAL_TABLET | Freq: Four times a day (QID) | ORAL | Status: DC | PRN
  Administered 2019-11-26 (×2): 650 mg via ORAL
  Filled 2019-11-21 (×2): qty 2

## 2019-11-21 MED ORDER — ONDANSETRON HCL 4 MG/2ML IJ SOLN: 4.0000 mg | Freq: Four times a day (QID) | INTRAMUSCULAR | Status: DC | PRN

## 2019-11-21 MED ORDER — SODIUM CHLORIDE 0.9 % IV SOLN
250.0000 mL | INTRAVENOUS | Status: DC | PRN
  Administered 2019-11-24: 10:00:00 250 mL via INTRAVENOUS

## 2019-11-21 MED ORDER — LEVOTHYROXINE SODIUM 50 MCG PO TABS
50.0000 ug | ORAL_TABLET | Freq: Every day | ORAL | Status: DC
  Administered 2019-11-22 – 2019-11-30 (×9): 50 ug via ORAL
  Filled 2019-11-21 (×9): qty 1

## 2019-11-21 MED ORDER — CARVEDILOL 3.125 MG PO TABS: 3.1250 mg | ORAL_TABLET | Freq: Every morning | ORAL | Status: DC

## 2019-11-21 MED ORDER — ALBUTEROL SULFATE HFA 108 (90 BASE) MCG/ACT IN AERS
2.0000 | INHALATION_SPRAY | Freq: Four times a day (QID) | RESPIRATORY_TRACT | Status: DC
  Administered 2019-11-22 (×2): 2 via RESPIRATORY_TRACT
  Filled 2019-11-21: qty 6.7

## 2019-11-21 MED ORDER — HEPARIN SODIUM (PORCINE) 5000 UNIT/ML IJ SOLN
5000.0000 [IU] | Freq: Three times a day (TID) | INTRAMUSCULAR | Status: DC
  Administered 2019-11-21 – 2019-11-30 (×26): 5000 [IU] via SUBCUTANEOUS
  Filled 2019-11-21 (×26): qty 1

## 2019-11-21 MED ORDER — SODIUM CHLORIDE 0.9% FLUSH
3.0000 mL | Freq: Once | INTRAVENOUS | Status: AC
  Administered 2019-11-21: 18:00:00 3 mL via INTRAVENOUS

## 2019-11-21 MED ORDER — SODIUM CHLORIDE 0.9% FLUSH
3.0000 mL | Freq: Two times a day (BID) | INTRAVENOUS | Status: DC
  Administered 2019-11-21 – 2019-11-30 (×15): 3 mL via INTRAVENOUS

## 2019-11-21 MED ORDER — FUROSEMIDE 10 MG/ML IJ SOLN
80.0000 mg | Freq: Two times a day (BID) | INTRAMUSCULAR | Status: DC
  Administered 2019-11-21 – 2019-11-23 (×4): 80 mg via INTRAVENOUS
  Filled 2019-11-21 (×4): qty 8

## 2019-11-21 MED ORDER — CENTRUM PO CHEW
1.0000 | CHEWABLE_TABLET | Freq: Every day | ORAL | Status: DC
  Filled 2019-11-21: qty 1

## 2019-11-21 MED ORDER — MAGNESIUM HYDROXIDE 400 MG/5ML PO SUSP: 15.0000 mL | Freq: Every day | ORAL | Status: DC | PRN

## 2019-11-21 MED ORDER — ACETAMINOPHEN 325 MG PO TABS: 650.0000 mg | ORAL_TABLET | ORAL | Status: DC | PRN

## 2019-11-21 NOTE — H&P (Signed)
Cardiology Admission History and Physical:   Patient ID: Crystal [name] MRN: [id number]; DOB: August 09, 1934   Admission date: 11/21/2019  Primary Care Provider: Mosie Lukes, MD Primary Cardiologist: [name], MD Primary Electrophysiologist:  None   Chief Complaint:  Dyspnea and weight gain  Patient Profile:   Crystal [name] is a 86 y.o. female with  a hx of nonischemic cardiomyopathy, anxiety, hypertension, hypothyroidism, and sarcoidosis presented with SOB  History of Present Illness:   Crystal [name] is a 86 y.o. female with a hx of nonischemic cardiomyopathy, anxiety, hypertension, hypothyroidism, and sarcoidosis.  Cardiac catheterization in 2010 showed EF 15 to 20% with normal coronary arteries.  She had syncope episode remotely that was felt to be vagally mediated.  She has declined ICD and LifeVest in the past and prefers only medical therapy.  Most recent echocardiogram obtained in January 2020 showed EF 35 to 40%, grade 2 DD, severe LAE and moderate RV dysfunction.  She underwent mediastinoscopy for adenopathy and new lung mass in early 2020, pathology consistent with sarcoidosis.  She was started on prednisone.  She has angioedema on ACE inhibitor and he could not tolerate hydralazine and nitrates.    More recently, patient arrived in the office in early December with acute dyspnea and hypoxemia, she was offered the chest to be admitted to the hospital however she declined.  Due to signs of volume overload, her Lasix was increased to 80 mg daily for 3 days.  When she returned to see Crystal Ransom, PA-C for virtual visit on 11/03/2019, she remained volume overloaded.  Therefore she was instructed to increase Lasix again to 80 mg daily for 3 days before going back to the previous dose.  She returns today for cardiology follow-up.  Patient presents to cardiology office today for increasing dyspnea.  She has gained 40 pounds since September 2020, she is accompanied by her husband and  appears to be quite dyspneic even in the cardiology office.  Fortunately her O2 saturation remained 100%.  She has at least 3+ pitting edema in bilateral lower extremity and I cannot appreciate any air movement in the lower one third of her lung bilaterally.  She is in severe acute on chronic systolic heart failure.  She need IV Lasix.  I discussed the case with DOD Dr. Martinique, we initially recommended direct admission under cardiology service, however there 30 patients in front of her waiting for direct admission according to bed control.  We eventually decided to send her to the emergency room so she can get her IV Lasix early.  Heart Pathway Score:     Past Medical History:  Diagnosis Date  . Anemia 08/21/2017  . Anxiety   . Cardiomyopathy    non ischemic NL cos on cath 08/2009, EF to 15-20%, Her follow up  2D echo  in 10/2009 showed impoved EFo 35-40%  . CHF (congestive heart failure) ([place])   . Cough   . Depression   . Dyspnea   . Edema of leg   . Full dentures   . Hypercalcemia 04/20/2017  . Hypertension   . Hypothyroid   . Lung mass   . Medicare annual wellness visit, subsequent 12/14/2014   Follows with Dr Marin Olp Follows with Dr Deatra Ina, gastroenterology, last colonoscopy in 2012, repeat in 2017 Last Pap 2011, always normal, no need for repeat Last MGM in 2011, no concerns, declines for now Follows with Dr Stanford Breed of cardiology   . Non Hodgkin's lymphoma ([place]) 11/17/1999   -Dr.  Ennever  . Pleural effusion   . Vitamin D deficiency 10/17/2015  . Wears glasses     Past Surgical History:  Procedure Laterality Date  . DILATION AND CURETTAGE OF UTERUS    . MEDIASTINOSCOPY N/A 05/24/2019   Procedure: MEDIASTINOSCOPY;  Surgeon: Melrose Nakayama, MD;  Location: [facility] OR;  Service: Thoracic;  Laterality: N/A;  . MULTIPLE TOOTH EXTRACTIONS    . NASAL POLYP EXCISION  1962  . TONSILLECTOMY  1952  . VIDEO BRONCHOSCOPY WITH ENDOBRONCHIAL NAVIGATION Right 06/29/2018   Procedure: VIDEO  BRONCHOSCOPY WITH ENDOBRONCHIAL NAVIGATION;  Surgeon: Collene Gobble, MD;  Location: [facility];  Service: Thoracic;  Laterality: Right;  Marland Kitchen VIDEO BRONCHOSCOPY WITH ENDOBRONCHIAL NAVIGATION N/A 05/24/2019   Procedure: VIDEO BRONCHOSCOPY WITH ENDOBRONCHIAL NAVIGATION;  Surgeon: Melrose Nakayama, MD;  Location: [facility];  Service: Thoracic;  Laterality: N/A;  . VIDEO BRONCHOSCOPY WITH ENDOBRONCHIAL ULTRASOUND Right 06/29/2018   Procedure: VIDEO BRONCHOSCOPY WITH ENDOBRONCHIAL ULTRASOUND;  Surgeon: Collene Gobble, MD;  Location: [facility];  Service: Thoracic;  Laterality: Right;  Marland Kitchen VIDEO BRONCHOSCOPY WITH ENDOBRONCHIAL ULTRASOUND N/A 05/24/2019   Procedure: VIDEO BRONCHOSCOPY WITH ENDOBRONCHIAL ULTRASOUND;  Surgeon: Melrose Nakayama, MD;  Location[facility];  Service: Thoracic;  Laterality: N/A;     Medications Prior to Admission: Prior to Admission medications   Medication Sig Start Date End Date Taking? Authorizing Provider  acetaminophen (TYLENOL) 325 MG tablet Take 2 tablets (650 mg total) by mouth every 6 (six) hours as needed for fever, headache, mild pain or moderate pain. 05/25/19   Nani Skillern, PA-C  albuterol (VENTOLIN HFA) 108 (90 Base) MCG/ACT inhaler INHALE 1 TO 2 PUFFS BY MOUTH EVERY 4 HOURS AS NEEDED FOR WHEEZING FOR SHORTNESS OF BREATH 11/02/19   Mosie Lukes, MD  ALPRAZolam Duanne Moron) 0.25 MG tablet Take 1 tablet by mouth twice daily as needed for anxiety Patient taking differently: Take 0.25 mg by mouth 2 (two) times daily as needed for anxiety.  05/15/19   Mosie Lukes, MD  benzonatate (TESSALON) 100 MG capsule TAKE 1 TO 2 CAPSULES BY MOUTH THREE TIMES DAILY AS NEEDED FOR COUGH 11/14/19   Mosie Lukes, MD  carvedilol (COREG) 3.125 MG tablet Take 1 tablet (3.125 mg total) by mouth every morning. 05/29/19   Mosie Lukes, MD  furosemide (LASIX) 40 MG tablet Take 1.5 tablets (60 mg total) by mouth daily. TAKE AS DIRECTED 10/20/19 01/18/20  Erlene Quan, PA-C  levothyroxine  (SYNTHROID) 50 MCG tablet Take 1 tablet (50 mcg total) by mouth daily. 05/31/19   Mosie Lukes, MD  magnesium hydroxide (MILK OF MAGNESIA) 400 MG/5ML suspension Take 15 mLs by mouth daily as needed for mild constipation.    [provider]  Multiple Vitamins-Minerals (CENTRUM PO) Take 1 tablet by mouth daily.     [provider]  Omega-3 Fatty Acids (FISH OIL) 1000 MG CAPS Take 1,000 mg by mouth daily.    [provider]  Probiotic Product (PROBIOTIC DAILY PO) Take 1 capsule by mouth daily.     [provider]  Spacer/Aero-Holding Chambers (AEROCHAMBER MV) inhaler Use as instructed 01/30/19   Magdalen Spatz, NP     Allergies:    Allergies  Allergen Reactions  . Lisinopril Swelling    Facial and lip swelling  . Hydralazine Hcl Other (See Comments)    hypotension  . Iohexol Itching         Social History:   Social History   Socioeconomic History  .  Marital status: Married    Spouse name: Jeneen Rinks  . Number of children: 5  . Years of education: Not on file  . Highest education level: Not on file  Occupational History  . Occupation: retired  Tobacco Use  . Smoking status: Never Smoker  . Smokeless tobacco: Never Used  Substance and Sexual Activity  . Alcohol use: Never    Alcohol/week: 0.0 standard drinks  . Drug use: Never  . Sexual activity: Yes    Comment: lives with husband, no dietary restrictions, minimizes dairy  Other Topics Concern  . Not on file  Social History Narrative   The patient is married ( husband Tiffanny Lamarche)  She just recently celebrated     her 52nd anniversary.  She has five children.  There is no active     tobacco or alcohol use history.          Smoking Status:  never   Caffeine use/day:  None   Does Patient Exercise:  yes   Social Determinants of Health   Financial Resource Strain:   . Difficulty of Paying Living Expenses: Not on file  Food Insecurity:   . Worried About Charity fundraiser in the Last Year:  Not on file  . Ran Out of Food in the Last Year: Not on file  Transportation Needs:   . Lack of Transportation (Medical): Not on file  . Lack of Transportation (Non-Medical): Not on file  Physical Activity:   . Days of Exercise per Week: Not on file  . Minutes of Exercise per Session: Not on file  Stress:   . Feeling of Stress : Not on file  Social Connections:   . Frequency of Communication with Friends and Family: Not on file  . Frequency of Social Gatherings with Friends and Family: Not on file  . Attends Religious Services: Not on file  . Active Member of Clubs or Organizations: Not on file  . Attends Archivist Meetings: Not on file  . Marital Status: Not on file  Intimate Partner Violence:   . Fear of Current or Ex-Partner: Not on file  . Emotionally Abused: Not on file  . Physically Abused: Not on file  . Sexually Abused: Not on file    Family History:   The patient's family history includes Arthritis in her sister, sister, and sister; Cancer in her brother; Colon cancer (age of onset: 65) in her brother; Colon polyps in her father; Diabetes in her brother, mother, sister, sister, sister, and sister; Hypertension in her daughter, daughter, and mother; Kidney disease in her brother; Liver disease in her daughter; Obesity in her son; Other in an other family member; Pneumonia in her father; Sarcoidosis in her daughter and sister; Vision loss in her mother. There is no history of Alcohol abuse or Heart disease.    ROS:  Please see the history of present illness.  All other ROS reviewed and negative.     Physical Exam/Data:   Vitals:   11/21/19 1240 11/21/19 1609  BP: 115/72 132/79  Pulse: 68 69  Resp: 20 20  Temp: 97.7 F (36.5 C)   TempSrc: Oral   SpO2: 97% 100%   No intake or output data in the 24 hours ending 11/21/19 1625 Last 3 Weights 11/21/2019 11/03/2019 10/20/2019  Weight (lbs) 152 lb 135 lb 128 lb  Weight (kg) 68.947 kg 61.236 kg 58.06 kg     There  is no height or weight on file to calculate BMI.  General:  Frail, dyspneic, feeling uncomfortable sitting in wheelchair HEENT: normal Lymph: no adenopathy Neck: +JVD Endocrine:  No thryomegaly Vascular:  Markedly diminished breath sounds in bilateral lower lung, unable to appreciate air movement within one third of the lower lung.   Cardiac:  normal S1, S2; RRR; no murmur  Lungs:  clear to auscultation bilaterally, no wheezing, rhonchi or rales  Abd: soft, nontender, no hepatomegaly  Ext: 3+ edema Musculoskeletal:  No deformities, BUE and BLE strength normal and equal Skin: warm and dry  Neuro:  CNs 2-12 intact, no focal abnormalities noted Psych:  Normal affect    EKG:  The ECG is pending in ED  Relevant CV Studies:  Echo 11/30/2018 LV EF: 35% - 40% Study Conclusions  - Left ventricle: The cavity size was normal. Wall thickness was normal. Systolic function was moderately reduced. The estimated ejection fraction was in the range of 35% to 40%. Features are consistent with a pseudonormal left ventricular filling pattern, with concomitant abnormal relaxation and increased filling pressure (grade 2 diastolic dysfunction). - Aortic valve: There was trivial regurgitation. - Left atrium: The atrium was severely dilated. - Right ventricle: The cavity size was moderately decreased.  Laboratory Data:  High Sensitivity Troponin:  No results for input(s): TROPONINIHS in the last 720 hours.    ChemistryNo results for input(s): NA, K, CL, CO2, GLUCOSE, BUN, CREATININE, CALCIUM, GFRNONAA, GFRAA, ANIONGAP in the last 168 hours.  No results for input(s): PROT, ALBUMIN, AST, ALT, ALKPHOS, BILITOT in the last 168 hours. Hematology Recent Labs  Lab 11/21/19 1321  WBC 4.3  RBC 4.28  HGB 13.0  HCT 41.2  MCV 96.3  MCH 30.4  MCHC 31.6  RDW 15.9*  PLT 91*   BNP Recent Labs  Lab 11/21/19 1321  BNP 3,536.1*    DDimer No results for input(s): DDIMER in the last 168  hours.   Radiology/Studies:  DG Chest 2 View  Result Date: 11/21/2019 CLINICAL DATA:  Short of breath and swelling in extremities.  CHF. EXAM: CHEST - 2 VIEW COMPARISON:  07/17/2019 FINDINGS: Bilateral small pleural effusions with bibasilar atelectasis. Vascular congestion with mild airspace disease. Atherosclerotic aortic arch. IMPRESSION: Congestive heart failure with mild edema and bilateral effusions. Bibasilar atelectasis. Electronically Signed   By: Franchot Gallo M.D.   On: 11/21/2019 13:15    Assessment and Plan:   1. Acute on chronic systolic heart failure       -She has gained 40 pounds of fluid since September.  She is severely dyspneic in the cardiology office, fortunately her O2 saturation remained 100% on 2 L nasal cannula.       -She has been seen by Dr. Martinique who was agreeable for direct admission to the hospital, unfortunately bed control mentions there are 30 patients waiting in front of her and she requires urgent IV diuresis as soon as possible.  I discussed with Dr. Martinique again we eventually decided to send her to the ED and cardiology can admit later once patient is worked up by ED physician.       -I recommend start on 80 mg twice daily of IV Lasix immediately as soon as she arrived in the emergency room and she will need a repeat echocardiogram as well.  2. Acute on chronic respiratory failure             -She is has been on 2 L nasal cannula 24/7 since July.  3. Nonischemic cardiomyopathy             -  She has a prior history of low EF down to 15%, she has refused ICD in the past, last echocardiogram in January 2020 showed improved EF of 35%.  She will need a repeat echocardiogram  4. Hypertension: Blood pressure controlled on current therapy  5. Hypothyroidism: On Synthroid  6. Pulmonary sarcoidosis: Confirmed on biopsy  Severity of Illness: The appropriate patient status for this patient is INPATIENT. Inpatient status is judged to be reasonable and  necessary in order to provide the required intensity of service to ensure the patient's safety. The patient's presenting symptoms, physical exam findings, and initial radiographic and laboratory data in the context of their chronic comorbidities is felt to place them at high risk for further clinical deterioration. Furthermore, it is not anticipated that the patient will be medically stable for discharge from the hospital within 2 midnights of admission. The following factors support the patient status of inpatient.   " The patient's presenting symptoms include dyspnea. " The worrisome physical exam findings include 3+ pitting edema. " The initial radiographic and laboratory data are worrisome because of Pending. " The chronic co-morbidities include NICM, chronic systolic CHF.   * I certify that at the point of admission it is my clinical judgment that the patient will require inpatient hospital care spanning beyond 2 midnights from the point of admission due to high intensity of service, high risk for further deterioration and high frequency of surveillance required.*    For questions or updates, please contact [place] Please consult [email or web address] for contact info under        Crystal [name], [name]  11/21/2019 4:25 PM

## 2019-11-21 NOTE — Progress Notes (Signed)
Cardiology Office Note:    Date:  11/21/2019   ID:  Crystal [name], DOB [birth year], MRN [id number]  PCP:  Mosie Lukes, MD  Cardiologist:  [name], MD  Electrophysiologist:  None   Referring MD: Mosie Lukes, MD   Chief Complaint  Patient presents with  . Follow-up    dyspnea, weight gain.    History of Present Illness:    Crystal [name] is a 86 y.o. female with a hx of nonischemic cardiomyopathy, anxiety, hypertension, hypothyroidism, and sarcoidosis.  Cardiac catheterization in 2010 showed EF 15 to 20% with normal coronary arteries.  She had syncope episode remotely that was felt to be vagally mediated.  She has declined ICD and LifeVest in the past and prefers only medical therapy.  Most recent echocardiogram obtained in January 2020 showed EF 35 to 40%, grade 2 DD, severe LAE and moderate RV dysfunction.  She underwent mediastinoscopy for adenopathy and new lung mass in early 2020, pathology consistent with sarcoidosis.  She was started on prednisone.  She has angioedema on ACE inhibitor and he could not tolerate hydralazine and nitrates.    More recently, patient arrived in the office in early December with acute dyspnea and hypoxemia, she was offered the chest to be admitted to the hospital however she declined.  Due to signs of volume overload, her Lasix was increased to 80 mg daily for 3 days.  When she returned to see Kerin Ransom, PA-C for virtual visit on 11/03/2019, she remained volume overloaded.  Therefore she was instructed to increase Lasix again to 80 mg daily for 3 days before going back to the previous dose.  She returns today for cardiology follow-up.  Patient presents to cardiology office today for increasing dyspnea.  She has gained 40 pounds since September 2020, she is accompanied by her husband and appears to be quite dyspneic even in the cardiology office.  Fortunately her O2 saturation remained 100%.  She has at least 3+ pitting edema in bilateral lower  extremity and I cannot appreciate any air movement in the lower one third of her lung bilaterally.  She is in severe acute on chronic systolic heart failure.  She need IV Lasix.  I discussed the case with DOD Dr. Martinique, we initially recommended direct admission under cardiology service, however there 30 patients in front of her waiting for direct admission according to bed control.  We eventually decided to send her to the emergency room so she can get her IV Lasix early.   Past Medical History:  Diagnosis Date  . Anemia 08/21/2017  . Anxiety   . Cardiomyopathy    non ischemic NL cos on cath 08/2009, EF to 15-20%, Her follow up  2D echo  in 10/2009 showed impoved EFo 35-40%  . CHF (congestive heart failure) ([place])   . Cough   . Depression   . Dyspnea   . Edema of leg   . Full dentures   . Hypercalcemia 04/20/2017  . Hypertension   . Hypothyroid   . Lung mass   . Medicare annual wellness visit, subsequent 12/14/2014   Follows with Dr Marin Olp Follows with Dr Deatra Ina, gastroenterology, last colonoscopy in 2012, repeat in 2017 Last Pap 2011, always normal, no need for repeat Last MGM in 2011, no concerns, declines for now Follows with Dr Stanford Breed of cardiology   . Non Hodgkin's lymphoma ([place]) 11/17/1999   -Dr. Marin Olp  . Pleural effusion   . Vitamin D deficiency 10/17/2015  . Wears  glasses     Past Surgical History:  Procedure Laterality Date  . DILATION AND CURETTAGE OF UTERUS    . MEDIASTINOSCOPY N/A 05/24/2019   Procedure: MEDIASTINOSCOPY;  Surgeon: Melrose Nakayama, MD;  Location: [facility] OR;  Service: Thoracic;  Laterality: N/A;  . MULTIPLE TOOTH EXTRACTIONS    . NASAL POLYP EXCISION  1962  . TONSILLECTOMY  1952  . VIDEO BRONCHOSCOPY WITH ENDOBRONCHIAL NAVIGATION Right 06/29/2018   Procedure: VIDEO BRONCHOSCOPY WITH ENDOBRONCHIAL NAVIGATION;  Surgeon: Collene Gobble, MD;  Location: [facility];  Service: Thoracic;  Laterality: Right;  Marland Kitchen VIDEO BRONCHOSCOPY WITH ENDOBRONCHIAL NAVIGATION N/A  05/24/2019   Procedure: VIDEO BRONCHOSCOPY WITH ENDOBRONCHIAL NAVIGATION;  Surgeon: Melrose Nakayama, MD;  Location: [facility];  Service: Thoracic;  Laterality: N/A;  . VIDEO BRONCHOSCOPY WITH ENDOBRONCHIAL ULTRASOUND Right 06/29/2018   Procedure: VIDEO BRONCHOSCOPY WITH ENDOBRONCHIAL ULTRASOUND;  Surgeon: Collene Gobble, MD;  Location: [facility];  Service: Thoracic;  Laterality: Right;  Marland Kitchen VIDEO BRONCHOSCOPY WITH ENDOBRONCHIAL ULTRASOUND N/A 05/24/2019   Procedure: VIDEO BRONCHOSCOPY WITH ENDOBRONCHIAL ULTRASOUND;  Surgeon: Melrose Nakayama, MD;  Location: [facility] OR;  Service: Thoracic;  Laterality: N/A;    Current Medications: Current Meds  Medication Sig  . acetaminophen (TYLENOL) 325 MG tablet Take 2 tablets (650 mg total) by mouth every 6 (six) hours as needed for fever, headache, mild pain or moderate pain.  Marland Kitchen albuterol (VENTOLIN HFA) 108 (90 Base) MCG/ACT inhaler INHALE 1 TO 2 PUFFS BY MOUTH EVERY 4 HOURS AS NEEDED FOR WHEEZING FOR SHORTNESS OF BREATH  . ALPRAZolam (XANAX) 0.25 MG tablet Take 1 tablet by mouth twice daily as needed for anxiety (Patient taking differently: Take 0.25 mg by mouth 2 (two) times daily as needed for anxiety. )  . benzonatate (TESSALON) 100 MG capsule TAKE 1 TO 2 CAPSULES BY MOUTH THREE TIMES DAILY AS NEEDED FOR COUGH  . carvedilol (COREG) 3.125 MG tablet Take 1 tablet (3.125 mg total) by mouth every morning.  . furosemide (LASIX) 40 MG tablet Take 1.5 tablets (60 mg total) by mouth daily. TAKE AS DIRECTED  . levothyroxine (SYNTHROID) 50 MCG tablet Take 1 tablet (50 mcg total) by mouth daily.  . magnesium hydroxide (MILK OF MAGNESIA) 400 MG/5ML suspension Take 15 mLs by mouth daily as needed for mild constipation.  . Multiple Vitamins-Minerals (CENTRUM PO) Take 1 tablet by mouth daily.   . Omega-3 Fatty Acids (FISH OIL) 1000 MG CAPS Take 1,000 mg by mouth daily.  . Probiotic Product (PROBIOTIC DAILY PO) Take 1 capsule by mouth daily.   Marland Kitchen Spacer/Aero-Holding Chambers  (AEROCHAMBER MV) inhaler Use as instructed     Allergies:   Lisinopril, Hydralazine hcl, and Iohexol   Social History   Socioeconomic History  . Marital status: Married    Spouse name: Jeneen Rinks  . Number of children: 5  . Years of education: Not on file  . Highest education level: Not on file  Occupational History  . Occupation: retired  Tobacco Use  . Smoking status: Never Smoker  . Smokeless tobacco: Never Used  Substance and Sexual Activity  . Alcohol use: Never    Alcohol/week: 0.0 standard drinks  . Drug use: Never  . Sexual activity: Yes    Comment: lives with husband, no dietary restrictions, minimizes dairy  Other Topics Concern  . Not on file  Social History Narrative   The patient is married ( husband Fe Okubo)  She just recently celebrated     her 52nd anniversary.  She has  five children.  There is no active     tobacco or alcohol use history.          Smoking Status:  never   Caffeine use/day:  None   Does Patient Exercise:  yes   Social Determinants of Health   Financial Resource Strain:   . Difficulty of Paying Living Expenses: Not on file  Food Insecurity:   . Worried About Charity fundraiser in the Last Year: Not on file  . Ran Out of Food in the Last Year: Not on file  Transportation Needs:   . Lack of Transportation (Medical): Not on file  . Lack of Transportation (Non-Medical): Not on file  Physical Activity:   . Days of Exercise per Week: Not on file  . Minutes of Exercise per Session: Not on file  Stress:   . Feeling of Stress : Not on file  Social Connections:   . Frequency of Communication with Friends and Family: Not on file  . Frequency of Social Gatherings with Friends and Family: Not on file  . Attends Religious Services: Not on file  . Active Member of Clubs or Organizations: Not on file  . Attends Archivist Meetings: Not on file  . Marital Status: Not on file     Family History: The patient's family history includes  Arthritis in her sister, sister, and sister; Cancer in her brother; Colon cancer (age of onset: 33) in her brother; Colon polyps in her father; Diabetes in her brother, mother, sister, sister, sister, and sister; Hypertension in her daughter, daughter, and mother; Kidney disease in her brother; Liver disease in her daughter; Obesity in her son; Other in an other family member; Pneumonia in her father; Sarcoidosis in her daughter and sister; Vision loss in her mother. There is no history of Alcohol abuse or Heart disease.  ROS:   Please see the history of present illness.     All other systems reviewed and are negative.  EKGs/Labs/Other Studies Reviewed:    The following studies were reviewed today:  Echo 11/30/2018 LV EF: 35% -   40%  ------------------------------------------------------------------- Indications:      CHF - 428.0.  ------------------------------------------------------------------- History:   PMH:  Non-Hodgkin&'s Lymphoma. Hypothyroidism.  Risk factors:  Hypertension. Dyslipidemia.  ------------------------------------------------------------------- Study Conclusions  - Left ventricle: The cavity size was normal. Wall thickness was   normal. Systolic function was moderately reduced. The estimated   ejection fraction was in the range of 35% to 40%. Features are   consistent with a pseudonormal left ventricular filling pattern,   with concomitant abnormal relaxation and increased filling   pressure (grade 2 diastolic dysfunction). - Aortic valve: There was trivial regurgitation. - Left atrium: The atrium was severely dilated. - Right ventricle: The cavity size was moderately decreased.   EKG:  EKG is not ordered today.   Recent Labs: 01/12/2019: B Natriuretic Peptide 3,955.3 05/29/2019: Magnesium 2.1; TSH 4.97 06/28/2019: ALT 55 10/20/2019: Hemoglobin 11.4; Platelets 100 11/07/2019: BUN 38; Creatinine, Ser 1.70; NT-Pro BNP 12,815; Potassium 4.4; Sodium 145    Recent Lipid Panel    Component Value Date/Time   CHOL 136 05/29/2019 0946   TRIG 65.0 05/29/2019 0946   HDL 51.60 05/29/2019 0946   CHOLHDL 3 05/29/2019 0946   VLDL 13.0 05/29/2019 0946   LDLCALC 72 05/29/2019 0946    Physical Exam:    VS:  BP 117/80   Pulse 76   Ht 5\' 3"  (1.6 m)   Wt 152 lb (  68.9 kg)   SpO2 100%   BMI 26.93 kg/m     Wt Readings from Last 3 Encounters:  11/21/19 152 lb (68.9 kg)  11/03/19 135 lb (61.2 kg)  10/20/19 128 lb (58.1 kg)     GEN: Frail, dyspneic, feeling uncomfortable sitting in wheelchair. HEENT: Normal NECK: +JVD; No carotid bruits LYMPHATICS: No lymphadenopathy CARDIAC: RRR, no murmurs, rubs, gallops RESPIRATORY: Markedly diminished breath sounds in bilateral lower lung, unable to appreciate air movement within one third of the lower lung.   ABDOMEN: Soft, non-tender, non-distended MUSCULOSKELETAL:  3+edema; No deformity  SKIN: Warm and dry NEUROLOGIC:  Alert and oriented x 3 PSYCHIATRIC:  Normal affect   ASSESSMENT:    1. Acute on chronic systolic CHF (congestive heart failure), NYHA class 4 ([place])   2. Acute on chronic respiratory failure with hypoxemia ([facility])   3. NICM (nonischemic cardiomyopathy) ([place])   4. Essential hypertension   5. Hypothyroidism, unspecified type   6. Pulmonary sarcoidosis ([place])    PLAN:    In order of problems listed above:  1. Acute on chronic systolic heart failure  -She has gained 40 pounds of fluid since September.  She is severely dyspneic in the cardiology office, fortunately her O2 saturation remained 100% on 2 L nasal cannula.  -She has been seen by Dr. Martinique who was agreeable for direct admission to the hospital, unfortunately bed control mentions there are 30 patients waiting in front of her and she requires urgent IV diuresis as soon as possible.  I discussed with Dr. Martinique again we eventually decided to send her to the ED and cardiology can admit later once patient is worked up by ED  physician.  -I recommend start on 80 mg twice daily of IV Lasix immediately as soon as she arrived in the emergency room and she will need a repeat echocardiogram as well.  2. Acute on chronic respiratory failure  -She is has been on 2 L nasal cannula 24/7 since July.  3. Nonischemic cardiomyopathy  -She has a prior history of low EF down to 15%, she has refused ICD in the past, last echocardiogram in January 2020 showed improved EF of 35%.  She will need a repeat echocardiogram  4. Hypertension: Blood pressure controlled on current therapy  5. Hypothyroidism: On Synthroid  6. Pulmonary sarcoidosis: Confirmed on biopsy   Medication Adjustments/Labs and Tests Ordered: Current medicines are reviewed at length with the patient today.  Concerns regarding medicines are outlined above.  No orders of the defined types were placed in this encounter.  No orders of the defined types were placed in this encounter.   Patient Instructions  PLEASE GO TO THE EMERGENCY DEPARTMENT AT [facility].    Hilbert Corrigan, [name]  11/21/2019 11:23 AM    [facility]

## 2019-11-21 NOTE — ED Notes (Signed)
Lab called stating BMP was clotted and needs to be reordered and redrawn

## 2019-11-21 NOTE — Patient Instructions (Addendum)
PLEASE GO TO THE EMERGENCY DEPARTMENT AT [facility].

## 2019-11-21 NOTE — Telephone Encounter (Signed)
Left a voice message on the home number listed for the patient and her husband, Crystal [name] mobile number stating I was calling to get her prepared for her virtual visit with Kerin Ransom, PA-C and that I will be calling her again.

## 2019-11-21 NOTE — ED Provider Notes (Signed)
[facility] 2C CV PROGRESSIVE CARE Provider Note   CSN: [id number] Arrival date & time: 11/21/19  1200     History Chief Complaint  Patient presents with  . Congestive Heart Failure    Crystal [name] is a 86 y.o. female with a hx of CHF last EF 35%, depression, hypertension, non hodgkins lymphoma, & chronic respiratory failure on 2L via [place] who presents to the ED per cardiology for admission for CHF exacerbation. Patient states she has had progressively worsening dyspnea & leg edema for the past 1-2 months. Compliant with her diuretic. States dyspnea is constant, worse with exertion & supine position. Has had somewhat of a cough. Seen by outpatient cardiology who sent to the ED for direct admit. Patient denies fever, vomiting, or abdominal pain.   HPI     Past Medical History:  Diagnosis Date  . Anemia 08/21/2017  . Anxiety   . Cardiomyopathy    non ischemic NL cos on cath 08/2009, EF to 15-20%, Her follow up  2D echo  in 10/2009 showed impoved EFo 35-40%  . CHF (congestive heart failure) ([place])   . Cough   . Depression   . Dyspnea   . Edema of leg   . Full dentures   . Hypercalcemia 04/20/2017  . Hypertension   . Hypothyroid   . Lung mass   . Medicare annual wellness visit, subsequent 12/14/2014   Follows with Dr Marin Olp Follows with Dr Deatra Ina, gastroenterology, last colonoscopy in 2012, repeat in 2017 Last Pap 2011, always normal, no need for repeat Last MGM in 2011, no concerns, declines for now Follows with Dr Stanford Breed of cardiology   . Non Hodgkin's lymphoma ([place]) 11/17/1999   -Dr. Marin Olp  . Pleural effusion   . Vitamin D deficiency 10/17/2015  . Wears glasses     Patient Active Problem List   Diagnosis Date Noted  . CHF (congestive heart failure) ([place]) 11/21/2019  . Hypoxia 05/29/2019  . Mediastinal adenopathy 05/24/2019  . Acute on chronic respiratory failure with hypoxemia ([place]) 01/12/2019  . CKD (chronic kidney disease) stage 3, GFR 30-59 ml/min 11/30/2018  .  Acute on chronic combined systolic (congestive) and diastolic (congestive) heart failure ([place]) 11/29/2018  . Cavitating mass in left upper lung lobe 06/29/2018  . Hilar adenopathy 06/29/2018  . Sarcoidosis 06/22/2018  . IBS (irritable bowel syndrome) 02/21/2018  . Anemia 08/21/2017  . Hypercalcemia 04/20/2017  . Diffuse large B-cell lymphoma of lymph nodes of neck ([place]) 01/21/2017  . Mitral valve regurgitation, Moderate to severe 11/26/2015  . Chronic systolic heart failure ([place]) 11/26/2015  . Hyperlipidemia, mixed 10/17/2015  . Vitamin D deficiency 10/17/2015  . History of angioedema 06/11/2015  . Hematuria 06/11/2015  . Preventative health care 12/14/2014  . Hyperglycemia 03/24/2012  . Diverticulosis 10/01/2011  . Thrombocytopenia ([place]) 10/02/2010  . Hypothyroidism 01/24/2010  . RHEUMATOID FACTOR, POSITIVE 01/24/2010  . Non Hodgkin's lymphoma ([place]) 01/09/2010  . RAYNAUDS SYNDROME 01/09/2010  . Anxiety state 09/20/2009  . Essential hypertension 09/20/2009  . Nonischemic cardiomyopathy ([place]) 09/20/2009  . Congestive heart failure ([place]) 09/20/2009    Past Surgical History:  Procedure Laterality Date  . DILATION AND CURETTAGE OF UTERUS    . MEDIASTINOSCOPY N/A 05/24/2019   Procedure: MEDIASTINOSCOPY;  Surgeon: Melrose Nakayama, MD;  Location: [facility] OR;  Service: Thoracic;  Laterality: N/A;  . MULTIPLE TOOTH EXTRACTIONS    . NASAL POLYP EXCISION  1962  . TONSILLECTOMY  1952  . VIDEO BRONCHOSCOPY WITH ENDOBRONCHIAL NAVIGATION Right 06/29/2018  Procedure: VIDEO BRONCHOSCOPY WITH ENDOBRONCHIAL NAVIGATION;  Surgeon: Collene Gobble, MD;  Location: [place];  Service: Thoracic;  Laterality: Right;  Marland Kitchen VIDEO BRONCHOSCOPY WITH ENDOBRONCHIAL NAVIGATION N/A 05/24/2019   Procedure: VIDEO BRONCHOSCOPY WITH ENDOBRONCHIAL NAVIGATION;  Surgeon: Melrose Nakayama, MD;  Location: [place];  Service: Thoracic;  Laterality: N/A;  . VIDEO BRONCHOSCOPY WITH ENDOBRONCHIAL ULTRASOUND Right 06/29/2018    Procedure: VIDEO BRONCHOSCOPY WITH ENDOBRONCHIAL ULTRASOUND;  Surgeon: Collene Gobble, MD;  Location: [place];  Service: Thoracic;  Laterality: Right;  Marland Kitchen VIDEO BRONCHOSCOPY WITH ENDOBRONCHIAL ULTRASOUND N/A 05/24/2019   Procedure: VIDEO BRONCHOSCOPY WITH ENDOBRONCHIAL ULTRASOUND;  Surgeon: Melrose Nakayama, MD;  Location: [place];  Service: Thoracic;  Laterality: N/A;     OB History   No obstetric history on file.     Family History  Problem Relation Age of Onset  . Diabetes Mother        deceased secondary to diabetes  . Hypertension Mother   . Vision loss Mother   . Pneumonia Father        died age 65 due to complications of pneumonia  . Colon polyps Father   . Liver disease Daughter   . Sarcoidosis Daughter   . Diabetes Sister   . Colon cancer Brother 38  . Cancer Brother   . Obesity Son   . Diabetes Brother   . Kidney disease Brother   . Diabetes Sister   . Sarcoidosis Sister   . Arthritis Sister   . Arthritis Sister   . Diabetes Sister   . Arthritis Sister   . Diabetes Sister   . Hypertension Daughter   . Hypertension Daughter   . Other Other        no obvious premature cardiovascular disease or familial cardiomyopathy is noted  . Alcohol abuse Neg Hx   . Heart disease Neg Hx     Social History   Tobacco Use  . Smoking status: Never Smoker  . Smokeless tobacco: Never Used  Substance Use Topics  . Alcohol use: Never    Alcohol/week: 0.0 standard drinks  . Drug use: Never    Home Medications Prior to Admission medications   Medication Sig Start Date End Date Taking? Authorizing Provider  acetaminophen (TYLENOL) 325 MG tablet Take 2 tablets (650 mg total) by mouth every 6 (six) hours as needed for fever, headache, mild pain or moderate pain. 05/25/19   Nani Skillern, PA-C  albuterol (VENTOLIN HFA) 108 (90 Base) MCG/ACT inhaler INHALE 1 TO 2 PUFFS BY MOUTH EVERY 4 HOURS AS NEEDED FOR WHEEZING FOR SHORTNESS OF BREATH 11/02/19   Mosie Lukes, MD    ALPRAZolam Duanne Moron) 0.25 MG tablet Take 1 tablet by mouth twice daily as needed for anxiety Patient taking differently: Take 0.25 mg by mouth 2 (two) times daily as needed for anxiety.  05/15/19   Mosie Lukes, MD  benzonatate (TESSALON) 100 MG capsule TAKE 1 TO 2 CAPSULES BY MOUTH THREE TIMES DAILY AS NEEDED FOR COUGH 11/14/19   Mosie Lukes, MD  carvedilol (COREG) 3.125 MG tablet Take 1 tablet (3.125 mg total) by mouth every morning. 05/29/19   Mosie Lukes, MD  furosemide (LASIX) 40 MG tablet Take 1.5 tablets (60 mg total) by mouth daily. TAKE AS DIRECTED 10/20/19 01/18/20  Erlene Quan, PA-C  levothyroxine (SYNTHROID) 50 MCG tablet Take 1 tablet (50 mcg total) by mouth daily. 05/31/19   Mosie Lukes, MD  magnesium hydroxide (MILK OF MAGNESIA) 400 MG/5ML  suspension Take 15 mLs by mouth daily as needed for mild constipation.    [provider]  Multiple Vitamins-Minerals (CENTRUM PO) Take 1 tablet by mouth daily.     [provider]  Omega-3 Fatty Acids (FISH OIL) 1000 MG CAPS Take 1,000 mg by mouth daily.    [provider]  Probiotic Product (PROBIOTIC DAILY PO) Take 1 capsule by mouth daily.     [provider]  Spacer/Aero-Holding Chambers (AEROCHAMBER MV) inhaler Use as instructed 01/30/19   Magdalen Spatz, NP    Allergies    Lisinopril, Hydralazine hcl, and Iohexol  Review of Systems   Review of Systems  Constitutional: Negative for fever.  Respiratory: Positive for cough and shortness of breath.   Cardiovascular: Positive for leg swelling. Negative for chest pain.  Gastrointestinal: Negative for abdominal pain, diarrhea and vomiting.  Neurological: Negative for dizziness.  All other systems reviewed and are negative.   Physical Exam Updated Vital Signs BP 128/82   Pulse 77   Temp (!) 97.4 F (36.3 C) (Oral)   Resp (!) 27   Ht 5\' 3"  (1.6 m)   Wt 67 kg   SpO2 100%   BMI 26.17 kg/m   Physical Exam Vitals and nursing note  reviewed.  Constitutional:      General: She is not in acute distress.    Appearance: She is well-developed. She is not toxic-appearing.  HENT:     Head: Normocephalic and atraumatic.  Eyes:     General:        Right eye: No discharge.        Left eye: No discharge.     Conjunctiva/sclera: Conjunctivae normal.  Cardiovascular:     Rate and Rhythm: Normal rate and regular rhythm.  Pulmonary:     Effort: No respiratory distress.     Breath sounds: Decreased breath sounds (Bibasilar) present. No wheezing, rhonchi or rales.  Abdominal:     General: There is no distension.     Palpations: Abdomen is soft.     Tenderness: There is no abdominal tenderness.  Musculoskeletal:     Cervical back: Neck supple.     Comments: Symmetric pitting edema to the lower extremities, approximately 2-3+ to the lower legs.  No overlying erythema or warmth.  Skin:    General: Skin is warm and dry.     Findings: No rash.  Neurological:     Mental Status: She is alert.     Comments: Clear speech.   Psychiatric:        Behavior: Behavior normal.    ED Results / Procedures / Treatments   Labs (all labs ordered are listed, but only abnormal results are displayed) Labs Reviewed  CBC - Abnormal; Notable for the following components:      Result Value   RDW 15.9 (*)    Platelets 91 (*)    All other components within normal limits  BRAIN NATRIURETIC PEPTIDE - Abnormal; Notable for the following components:   B Natriuretic Peptide 3,536.1 (*)    All other components within normal limits  BASIC METABOLIC PANEL - Abnormal; Notable for the following components:   Chloride 96 (*)    BUN 33 (*)    Creatinine, Ser 1.50 (*)    GFR calc non Af Amer 32 (*)    GFR calc Af Amer 37 (*)    All other components within normal limits  SARS CORONAVIRUS 2 (TAT 6-24 HRS)  MRSA PCR SCREENING  TSH  BASIC METABOLIC PANEL  CBC  CREATININE, SERUM  MAGNESIUM    EKG None  Radiology DG Chest 2 View  Result Date:  11/21/2019 CLINICAL DATA:  Short of breath and swelling in extremities.  CHF. EXAM: CHEST - 2 VIEW COMPARISON:  07/17/2019 FINDINGS: Bilateral small pleural effusions with bibasilar atelectasis. Vascular congestion with mild airspace disease. Atherosclerotic aortic arch. IMPRESSION: Congestive heart failure with mild edema and bilateral effusions. Bibasilar atelectasis. Electronically Signed   By: Franchot Gallo M.D.   On: 11/21/2019 13:15    Procedures Procedures (including critical care time)  Medications Ordered in ED Medications  acetaminophen (TYLENOL) tablet 650 mg (has no administration in time range)  carvedilol (COREG) tablet 3.125 mg (has no administration in time range)  ALPRAZolam (XANAX) tablet 0.25 mg (has no administration in time range)  levothyroxine (SYNTHROID) tablet 50 mcg (has no administration in time range)  magnesium hydroxide (MILK OF MAGNESIA) suspension 15 mL (has no administration in time range)  multivitamin-iron-minerals-folic acid (CENTRUM) chewable tablet 1 tablet (has no administration in time range)  albuterol (VENTOLIN HFA) 108 (90 Base) MCG/ACT inhaler 2 puff (has no administration in time range)  sodium chloride flush (NS) 0.9 % injection 3 mL (has no administration in time range)  sodium chloride flush (NS) 0.9 % injection 3 mL (has no administration in time range)  0.9 %  sodium chloride infusion (has no administration in time range)  acetaminophen (TYLENOL) tablet 650 mg (has no administration in time range)  ondansetron (ZOFRAN) injection 4 mg (has no administration in time range)  heparin injection 5,000 Units (has no administration in time range)  furosemide (LASIX) injection 80 mg (has no administration in time range)  aspirin EC tablet 81 mg (has no administration in time range)  sodium chloride flush (NS) 0.9 % injection 3 mL (3 mLs Intravenous Given 11/21/19 1803)    ED Course  I have reviewed the triage vital signs and the nursing  notes.  Pertinent labs & imaging results that were available during my care of the patient were reviewed by me and considered in my medical decision making (see chart for details).    MDM Rules/Calculators/A&P                      Patient presents to the emergency department with complaints of worsening dyspnea and lower extremity edema for the past couple of months.  On my assessment she is resting comfortably, vitals without significant abnormality.  ED work-up reviewed, CBC without anemia or leukocytosis, BMP with renal function similar to prior, BNP grossly elevated at 3536.1.  Her chest x-ray has findings of CHF with mild edema and bilateral effusions.  At the time of my assessment of the patient she has a current H&P in her chart by cardiology service. Patient taken to floor due to bed placement.  Final Clinical Impression(s) / ED Diagnoses Final diagnoses:  Acute on chronic congestive heart failure, unspecified heart failure type [facility])    Rx / DC Orders ED Discharge Orders    None       Leafy Kindle 11/21/19 Doloris Hall, MD 11/21/19 2012

## 2019-11-21 NOTE — ED Triage Notes (Signed)
Pt here for increased leg swelling since Thanksgiving, worsening, with shob. Shob worse with exertion and lying down. Pt compliant with diuretic. Denies pain. Wears 2L O2 at home.

## 2019-11-22 ENCOUNTER — Inpatient Hospital Stay (HOSPITAL_COMMUNITY): Payer: Medicare Other

## 2019-11-22 DIAGNOSIS — I5023 Acute on chronic systolic (congestive) heart failure: Secondary | ICD-10-CM

## 2019-11-22 DIAGNOSIS — I361 Nonrheumatic tricuspid (valve) insufficiency: Secondary | ICD-10-CM

## 2019-11-22 DIAGNOSIS — I340 Nonrheumatic mitral (valve) insufficiency: Secondary | ICD-10-CM

## 2019-11-22 LAB — BASIC METABOLIC PANEL
Anion gap: 11 (ref 5–15)
BUN: 35 mg/dL — ABNORMAL HIGH (ref 8–23)
CO2: 29 mmol/L (ref 22–32)
Calcium: 9.4 mg/dL (ref 8.9–10.3)
Chloride: 97 mmol/L — ABNORMAL LOW (ref 98–111)
Creatinine, Ser: 1.48 mg/dL — ABNORMAL HIGH (ref 0.44–1.00)
GFR calc Af Amer: 38 mL/min — ABNORMAL LOW (ref >60)
GFR calc non Af Amer: 32 mL/min — ABNORMAL LOW (ref >60)
Glucose, Bld: 104 mg/dL — ABNORMAL HIGH (ref 70–99)
Potassium: 3.9 mmol/L (ref 3.5–5.1)
Sodium: 137 mmol/L (ref 135–145)

## 2019-11-22 LAB — ECHOCARDIOGRAM COMPLETE
Height: 63 in
Weight: 2507.95 oz

## 2019-11-22 NOTE — Progress Notes (Signed)
Echocardiogram 2D Echocardiogram has been performed.  Oneal Deputy Koi Zangara 11/22/2019, 8:28 AM

## 2019-11-22 NOTE — Progress Notes (Signed)
Progress Note  Patient Name: Crystal [name] Date of Encounter: 11/22/2019  Primary Cardiologist: [name], MD   Subjective   Remains dyspneic; no chest pain  Inpatient Medications    Scheduled Meds: . albuterol  2 puff Inhalation Q6H  . aspirin EC  81 mg Oral Daily  . carvedilol  3.125 mg Oral q morning - 10a  . furosemide  80 mg Intravenous BID  . heparin  5,000 Units Subcutaneous Q8H  . levothyroxine  50 mcg Oral Daily  . multivitamin with minerals  1 tablet Oral Daily  . sodium chloride flush  3 mL Intravenous Q12H   Continuous Infusions: . sodium chloride     PRN Meds: sodium chloride, acetaminophen, ALPRAZolam, magnesium hydroxide, ondansetron (ZOFRAN) IV, sodium chloride flush   Vital Signs    Vitals:   11/21/19 2355 11/22/19 0405 11/22/19 0745 11/22/19 0752  BP: 107/69 (!) 91/57 90/75   Pulse: 81 67 79   Resp: (!) 21 18 20    Temp: (!) 97.5 F (36.4 C) (!) 97.4 F (36.3 C) 98 F (36.7 C)   TempSrc: Oral Oral Oral   SpO2: 100% 100% 100% 100%  Weight:  71.1 kg    Height:        Intake/Output Summary (Last 24 hours) at 11/22/2019 0819 Last data filed at 11/22/2019 0549 Gross per 24 hour  Intake 120 ml  Output 300 ml  Net -180 ml   Last 3 Weights 11/22/2019 11/21/2019 11/21/2019  Weight (lbs) 156 lb 12 oz 147 lb 11.3 oz 152 lb  Weight (kg) 71.1 kg 67 kg 68.947 kg      Telemetry    Sinus with PVCs- Personally Reviewed  Physical Exam   GEN: No acute distress.   Neck: supple Cardiac: RRR Respiratory: Diminished BS bases GI: Soft, nontender, non-distended  MS: 3+ edema Neuro:  Nonfocal  Psych: Normal affect   Labs    Chemistry Recent Labs  Lab 11/21/19 1804 11/22/19 0232  NA 135 137  K 4.5 3.9  CL 96* 97*  CO2 28 29  GLUCOSE 95 104*  BUN 33* 35*  CREATININE 1.50* 1.48*  CALCIUM 9.7 9.4  GFRNONAA 32* 32*  GFRAA 37* 38*  ANIONGAP 11 11     Hematology Recent Labs  Lab 11/21/19 1321  WBC 4.3  RBC 4.28  HGB 13.0  HCT 41.2   MCV 96.3  MCH 30.4  MCHC 31.6  RDW 15.9*  PLT 91*    BNP Recent Labs  Lab 11/21/19 1321  BNP 3,536.1*     Radiology    DG Chest 2 View  Result Date: 11/21/2019 CLINICAL DATA:  Short of breath and swelling in extremities.  CHF. EXAM: CHEST - 2 VIEW COMPARISON:  07/17/2019 FINDINGS: Bilateral small pleural effusions with bibasilar atelectasis. Vascular congestion with mild airspace disease. Atherosclerotic aortic arch. IMPRESSION: Congestive heart failure with mild edema and bilateral effusions. Bibasilar atelectasis. Electronically Signed   By: Franchot Gallo M.D.   On: 11/21/2019 13:15    Patient Profile     86 y.o. female with nonischemic cardiomyopathy, history of sarcoidosis admitted with acute on chronic systolic congestive heart failure.  Assessment & Plan    1 acute on chronic systolic congestive heart failure-patient is markedly volume overloaded.  Continue Lasix 80 mg IV twice daily.  Follow renal function closely.  Needs fluid restriction and low-sodium diet.  May need milrinone.  2 nonischemic cardiomyopathy-blood pressure is borderline.  Discontinue carvedilol in setting of acute CHF.  No hydralazine/nitrates due to borderline hypotension.  History of angioedema with ACE inhibitors.  3 chronic stage III kidney disease-follow renal function closely with diuresis.  4 history of pulmonary sarcoidosis  For questions or updates, please contact [place] Please consult [email or web address] for contact info under        Signed, [name], MD  11/22/2019, 8:19 AM

## 2019-11-22 NOTE — Plan of Care (Signed)

## 2019-11-23 ENCOUNTER — Inpatient Hospital Stay (HOSPITAL_COMMUNITY): Payer: Medicare Other

## 2019-11-23 LAB — BASIC METABOLIC PANEL
Anion gap: 9 (ref 5–15)
Anion gap: 8 (ref 5–15)
BUN: 37 mg/dL — ABNORMAL HIGH (ref 8–23)
BUN: 37 mg/dL — ABNORMAL HIGH (ref 8–23)
CO2: 28 mmol/L (ref 22–32)
CO2: 32 mmol/L (ref 22–32)
Calcium: 9.2 mg/dL (ref 8.9–10.3)
Calcium: 9.1 mg/dL (ref 8.9–10.3)
Chloride: 98 mmol/L (ref 98–111)
Chloride: 95 mmol/L — ABNORMAL LOW (ref 98–111)
Creatinine, Ser: 1.69 mg/dL — ABNORMAL HIGH (ref 0.44–1.00)
Creatinine, Ser: 1.51 mg/dL — ABNORMAL HIGH (ref 0.44–1.00)
GFR calc Af Amer: 32 mL/min — ABNORMAL LOW (ref >60)
GFR calc Af Amer: 37 mL/min — ABNORMAL LOW (ref >60)
GFR calc non Af Amer: 28 mL/min — ABNORMAL LOW (ref >60)
GFR calc non Af Amer: 32 mL/min — ABNORMAL LOW (ref >60)
Glucose, Bld: 99 mg/dL (ref 70–99)
Glucose, Bld: 126 mg/dL — ABNORMAL HIGH (ref 70–99)
Potassium: 4.2 mmol/L (ref 3.5–5.1)
Potassium: 3.8 mmol/L (ref 3.5–5.1)
Sodium: 135 mmol/L (ref 135–145)
Sodium: 135 mmol/L (ref 135–145)

## 2019-11-23 LAB — BLOOD GAS, ARTERIAL
Acid-Base Excess: 4.4 mmol/L — ABNORMAL HIGH (ref 0.0–2.0)
Bicarbonate: 28.4 mmol/L — ABNORMAL HIGH (ref 20.0–28.0)
FIO2: 21
O2 Saturation: 93.7 %
Patient temperature: 37
pCO2 arterial: 41.8 mmHg (ref 32.0–48.0)
pH, Arterial: 7.446 (ref 7.350–7.450)
pO2, Arterial: 68.1 mmHg — ABNORMAL LOW (ref 83.0–108.0)

## 2019-11-23 LAB — MAGNESIUM: Magnesium: 2.1 mg/dL (ref 1.7–2.4)

## 2019-11-23 MED ORDER — FUROSEMIDE 10 MG/ML IJ SOLN: 80.0000 mg | Freq: Once | INTRAMUSCULAR | Status: DC

## 2019-11-23 MED ORDER — ALBUTEROL SULFATE (2.5 MG/3ML) 0.083% IN NEBU: 2.5000 mg | INHALATION_SOLUTION | RESPIRATORY_TRACT | Status: DC | PRN

## 2019-11-23 MED ORDER — FUROSEMIDE 10 MG/ML IJ SOLN
INTRAMUSCULAR | Status: AC
  Administered 2019-11-23: 80 mg
  Filled 2019-11-23: qty 8

## 2019-11-23 MED ORDER — FUROSEMIDE 10 MG/ML IJ SOLN
120.0000 mg | Freq: Two times a day (BID) | INTRAVENOUS | Status: DC
  Administered 2019-11-23 – 2019-11-26 (×7): 120 mg via INTRAVENOUS
  Filled 2019-11-23 (×3): qty 12
  Filled 2019-11-23: qty 10
  Filled 2019-11-23: qty 2
  Filled 2019-11-23 (×4): qty 10

## 2019-11-23 MED ORDER — MILRINONE LACTATE IN DEXTROSE 20-5 MG/100ML-% IV SOLN
0.1250 ug/kg/min | INTRAVENOUS | Status: DC
  Administered 2019-11-23 – 2019-11-27 (×4): 0.125 ug/kg/min via INTRAVENOUS
  Filled 2019-11-23 (×4): qty 100

## 2019-11-23 NOTE — Progress Notes (Addendum)
Called to see patient earlier at approximately 10:45 AM for increasing dyspnea.  Patient apparently started coughing and then developed worsening dyspnea.  She was placed on 100% rebreather and her saturation increased to 100%.  She was given an additional 80 mg of Lasix.  I also initiated milrinone.  We will continue to follow.  Patient much improved at end of visit. [name]

## 2019-11-23 NOTE — Progress Notes (Signed)
Received a page from nursing staff saying patient feels short of breath. Oxygen was increased to 3L. Patient is awake and alert. She received Lasix 80 mg this morning. Seen by Dr. Stanford Breed show increased lasix to 120 mg. Will order ABG and CXR.  Vitals: HR 82, B/P 110/84. RR 26, O2 92% on 3L  Crystal [name] Crystal [name]

## 2019-11-23 NOTE — Plan of Care (Signed)
  Problem: Education: Goal: Ability to demonstrate management of disease process will improve Outcome: Progressing Goal: Ability to verbalize understanding of medication therapies will improve Outcome: Progressing Goal: Individualized Educational Video(s) Outcome: Progressing   Problem: Activity: Goal: Capacity to carry out activities will improve Outcome: Progressing   Problem: Cardiac: Goal: Ability to achieve and maintain adequate cardiopulmonary perfusion will improve Outcome: Progressing   Problem: Education: Goal: Ability to demonstrate management of disease process will improve 11/23/2019 0737 by Shanon Ace, RN Outcome: Progressing 11/23/2019 0737 by Shanon Ace, RN Outcome: Progressing Goal: Ability to verbalize understanding of medication therapies will improve 11/23/2019 0737 by Shanon Ace, RN Outcome: Progressing 11/23/2019 0737 by Shanon Ace, RN Outcome: Progressing Goal: Individualized Educational Video(s) 11/23/2019 0737 by Shanon Ace, RN Outcome: Progressing 11/23/2019 0737 by Shanon Ace, RN Outcome: Progressing   Problem: Activity: Goal: Capacity to carry out activities will improve 11/23/2019 0737 by Shanon Ace, RN Outcome: Progressing 11/23/2019 0737 by Shanon Ace, RN Outcome: Progressing   Problem: Cardiac: Goal: Ability to achieve and maintain adequate cardiopulmonary perfusion will improve 11/23/2019 0737 by Shanon Ace, RN Outcome: Progressing 11/23/2019 0737 by Shanon Ace, RN Outcome: Progressing   Problem: Education: Goal: Knowledge of General Education information will improve Description: Including pain rating scale, medication(s)/side effects and non-pharmacologic comfort measures Outcome: Progressing   Problem: Health Behavior/Discharge Planning: Goal: Ability to manage health-related needs will improve Outcome: Progressing   Problem: Clinical Measurements: Goal: Ability to maintain clinical measurements within normal limits  will improve Outcome: Progressing Goal: Will remain free from infection Outcome: Progressing Goal: Diagnostic test results will improve Outcome: Progressing Goal: Respiratory complications will improve Outcome: Progressing Goal: Cardiovascular complication will be avoided Outcome: Progressing   Problem: Activity: Goal: Risk for activity intolerance will decrease Outcome: Progressing   Problem: Nutrition: Goal: Adequate nutrition will be maintained Outcome: Progressing   Problem: Coping: Goal: Level of anxiety will decrease Outcome: Progressing   Problem: Elimination: Goal: Will not experience complications related to bowel motility Outcome: Progressing Goal: Will not experience complications related to urinary retention Outcome: Progressing   Problem: Pain Managment: Goal: General experience of comfort will improve Outcome: Progressing   Problem: Safety: Goal: Ability to remain free from injury will improve Outcome: Progressing   Problem: Skin Integrity: Goal: Risk for impaired skin integrity will decrease Outcome: Progressing

## 2019-11-23 NOTE — Significant Event (Addendum)
Rapid Response Event Note  Overview: Time Called: 1048 Arrival Time: 1050 Event Type: Respiratory(Sudden onset shortness of breath)  Initial Focused Assessment: Pt had sudden onset of shortness of breath following coughing fit. Pt is able to communicate, but only in short phrases before needing to take a breath. Accessory muscle use noted in her chest and abdomen. Pt awake, alert. Pt on 3L [place] with oxygen saturation of 93%, RR 28-39, HR 82, BP 110/84. Lung sounds are clear. HR is irregular with PVCs noted, but HR remains in the 80s. No adventitious heart sounds noted. 2+ edema noted in patient's lower extremities.   Interventions: Pt placed on 15L NRB for work of breathing. Respiratory rate improving to 25-30 breaths/ minute. Dr. Stanford Breed at bedside to assess patient. Orders placed for Lasix, ABG, CXR, and Milrinone gtt.  Plan of Care (if not transferred): RN to start Milrinone gtt and wean off NRB to nasal cannula if patient can tolerate and has improvement in work of breathing. Patient on continuous pulse oximetry monitoring. BP set to cycle every 15 minutes.   Event Summary: Name of Physician Notified: Dr. Stanford Breed at 1048    at    Outcome: Stayed in room and stabalized  Event End Time: [place]

## 2019-11-23 NOTE — Progress Notes (Signed)
Progress Note  Patient Name: Crystal [name] Date of Encounter: 11/23/2019  Primary Cardiologist: [name], MD   Subjective   Still dyspneic; no chest pain  Inpatient Medications    Scheduled Meds: . aspirin EC  81 mg Oral Daily  . furosemide  80 mg Intravenous BID  . heparin  5,000 Units Subcutaneous Q8H  . levothyroxine  50 mcg Oral Daily  . multivitamin with minerals  1 tablet Oral Daily  . sodium chloride flush  3 mL Intravenous Q12H   Continuous Infusions: . sodium chloride     PRN Meds: sodium chloride, acetaminophen, albuterol, ALPRAZolam, magnesium hydroxide, ondansetron (ZOFRAN) IV, sodium chloride flush   Vital Signs    Vitals:   11/22/19 2344 11/22/19 2345 11/23/19 0415 11/23/19 0425  BP: 108/71 108/71 92/68   Pulse: 77 78 75 82  Resp: (!) 30 (!) 25 (!) 25 19  Temp: (!) 97.4 F (36.3 C)  97.6 F (36.4 C)   TempSrc: Oral  Oral   SpO2: 99% 99% 99% 96%  Weight:   67 kg   Height:        Intake/Output Summary (Last 24 hours) at 11/23/2019 0745 Last data filed at 11/23/2019 0422 Gross per 24 hour  Intake 132 ml  Output 200 ml  Net -68 ml   Last 3 Weights 11/23/2019 11/22/2019 11/21/2019  Weight (lbs) 147 lb 11.3 oz 156 lb 12 oz 147 lb 11.3 oz  Weight (kg) 67 kg 71.1 kg 67 kg      Telemetry    Sinus with PVCs- Personally Reviewed  Physical Exam   GEN: WD, elderly, NAD  Neck: positive JVD Cardiac: RRR Respiratory: Diminished BS bases; no wheeze GI: Soft, NT/ND MS: 3+ edema Neuro:  Grossly intact Psych: Normal affect   Labs    Chemistry Recent Labs  Lab 11/21/19 1804 11/22/19 0232 11/23/19 0221  NA 135 137 135  K 4.5 3.9 4.2  CL 96* 97* 98  CO2 28 29 28   GLUCOSE 95 104* 99  BUN 33* 35* 37*  CREATININE 1.50* 1.48* 1.69*  CALCIUM 9.7 9.4 9.2  GFRNONAA 32* 32* 28*  GFRAA 37* 38* 32*  ANIONGAP 11 11 9      Hematology Recent Labs  Lab 11/21/19 1321  WBC 4.3  RBC 4.28  HGB 13.0  HCT 41.2  MCV 96.3  MCH 30.4  MCHC 31.6  RDW  15.9*  PLT 91*    BNP Recent Labs  Lab 11/21/19 1321  BNP 3,536.1*     Radiology    DG Chest 2 View  Result Date: 11/22/2019 CLINICAL DATA:  Short of breath EXAM: CHEST - 2 VIEW COMPARISON:  11/21/2019 FINDINGS: Small bilateral pleural effusions and bibasilar atelectasis unchanged. No edema. Perihilar atelectasis bilaterally. IMPRESSION: Stable bibasilar pleural effusions and airspace disease. Negative for edema. Electronically Signed   By: Franchot Gallo M.D.   On: 11/22/2019 08:56   DG Chest 2 View  Result Date: 11/21/2019 CLINICAL DATA:  Short of breath and swelling in extremities.  CHF. EXAM: CHEST - 2 VIEW COMPARISON:  07/17/2019 FINDINGS: Bilateral small pleural effusions with bibasilar atelectasis. Vascular congestion with mild airspace disease. Atherosclerotic aortic arch. IMPRESSION: Congestive heart failure with mild edema and bilateral effusions. Bibasilar atelectasis. Electronically Signed   By: Franchot Gallo M.D.   On: 11/21/2019 13:15   ECHOCARDIOGRAM COMPLETE  Result Date: 11/22/2019   ECHOCARDIOGRAM REPORT   Patient Name:   Crystal [name] Date of Exam: 11/22/2019 Medical Rec #:  [id number]      Height:       63.0 in Accession #:    [id number]     Weight:       156.7 lb Date of Birth:  1934-04-01      BSA:          1.74 m Patient Age:    86 years       BP:           91/57 mmHg Patient Gender: F              HR:           68 bpm. Exam Location:  Inpatient Procedure: 2D Echo, 3D Echo, Color Doppler and Cardiac Doppler Indications:    R06.9 DOE  History:        Patient has prior history of Echocardiogram examinations, most                 recent 11/30/2018. CHF; Risk Factors:Hypertension and                 Dyslipidemia.  Sonographer:    Raquel Sarna Senior RDCS Referring Phys: [place]  1. Left ventricular ejection fraction, by visual estimation, is 30 to 35%. The left ventricle has moderate to severely decreased function. There is no left ventricular hypertrophy.  2. The  left ventricle demonstrates global hypokinesis with inferior/inferoseptal akinesis. Apex with significant trabeculations, no clear thrombus seen but would consider limited TTE with contrast to rule out apical thrombus  3. Left ventricular diastolic parameters are consistent with Grade II diastolic dysfunction (pseudonormalization).  4. Elevated left atrial pressure.  5. Global right ventricle has moderately reduced systolic function.The right ventricular size is mildly enlarged.  6. Moderate pleural effusion in the left lateral region.  7. The mitral valve is normal in structure. Mild mitral valve regurgitation.  8. The tricuspid valve is normal in structure.  9. The aortic valve is abnormal. Moderately thickened leaflets. Aortic valve regurgitation is not visualized. Mild to moderate aortic valve sclerosis/calcification without any evidence of aortic stenosis. 10. The pulmonic valve was grossly normal. Pulmonic valve regurgitation is trivial. 11. Left atrial size was mildly dilated. 12. Right atrial size was moderately dilated. 13. The inferior vena cava is normal in size with <50% respiratory variability, suggesting right atrial pressure of 8 mmHg. 14. The tricuspid regurgitant velocity is 2.81 m/s, and with an assumed right atrial pressure of 8 mmHg, the estimated right ventricular systolic pressure is mildly elevated at 39.6 mmHg. FINDINGS  Left Ventricle: Left ventricular ejection fraction, by visual estimation, is 30 to 35%. The left ventricle has moderate to severely decreased function. The left ventricle demonstrates global hypokinesis. There is no left ventricular hypertrophy. Left ventricular diastolic parameters are consistent with Grade II diastolic dysfunction (pseudonormalization). Elevated left atrial pressure. Right Ventricle: The right ventricular size is mildly enlarged. No increase in right ventricular wall thickness. Global RV systolic function is has moderately reduced systolic function. The  tricuspid regurgitant velocity is 2.81 m/s, and with an assumed right atrial pressure of 8 mmHg, the estimated right ventricular systolic pressure is mildly elevated at 39.6 mmHg. Left Atrium: Left atrial size was mildly dilated. Right Atrium: Right atrial size was moderately dilated Pericardium: Trivial pericardial effusion is present. There is a moderate pleural effusion in the left lateral region. Mitral Valve: The mitral valve is normal in structure. Mild mitral valve regurgitation. Tricuspid Valve: The tricuspid valve is normal in structure. Tricuspid valve regurgitation  is mild. Aortic Valve: The aortic valve is abnormal. . There is moderate thickening of the aortic valve. Aortic valve regurgitation is not visualized. Mild to moderate aortic valve sclerosis/calcification is present, without any evidence of aortic stenosis. There  is moderate thickening of the aortic valve. Pulmonic Valve: The pulmonic valve was grossly normal. Pulmonic valve regurgitation is trivial. Pulmonic regurgitation is trivial. Aorta: The aortic root and ascending aorta are structurally normal, with no evidence of dilitation. Venous: The inferior vena cava is normal in size with less than 50% respiratory variability, suggesting right atrial pressure of 8 mmHg. IAS/Shunts: The interatrial septum was not well visualized.  LEFT VENTRICLE PLAX 2D LVIDd:         5.10 cm       Diastology LVIDs:         4.20 cm       LV e' lateral:   6.87 cm/s LV PW:         0.90 cm       LV E/e' lateral: 16.2 LV IVS:        0.60 cm       LV e' medial:    3.37 cm/s LVOT diam:     1.90 cm       LV E/e' medial:  32.9 LV SV:         45 ml LV SV Index:   25.16 LVOT Area:     2.84 cm                               3D Volume EF: LV Volumes (MOD)             3D EF:        32 % LV area d, A2C:    34.10 cm LV EDV:       172 ml LV area d, A4C:    33.00 cm LV ESV:       117 ml LV area s, A2C:    28.20 cm LV SV:        55 ml LV area s, A4C:    26.40 cm LV major d,  A2C:   8.58 cm LV major d, A4C:   7.93 cm LV major s, A2C:   8.00 cm LV major s, A4C:   7.64 cm LV vol d, MOD A2C: 112.0 ml LV vol d, MOD A4C: 114.0 ml LV vol s, MOD A2C: 84.2 ml LV vol s, MOD A4C: 76.4 ml LV SV MOD A2C:     27.8 ml LV SV MOD A4C:     114.0 ml LV SV MOD BP:      35.6 ml RIGHT VENTRICLE RV S prime:     5.36 cm/s TAPSE (M-mode): 1.2 cm LEFT ATRIUM             Index       RIGHT ATRIUM           Index LA diam:        2.60 cm 1.49 cm/m  RA Area:     19.60 cm LA Vol (A2C):   57.8 ml 33.15 ml/m RA Volume:   64.10 ml  36.77 ml/m LA Vol (A4C):   59.5 ml 34.13 ml/m LA Biplane Vol: 60.1 ml 34.47 ml/m  AORTIC VALVE LVOT Vmax:   69.00 cm/s LVOT Vmean:  44.200 cm/s LVOT VTI:    0.129 m  AORTA Ao Root diam: 2.60 cm Ao  Asc diam:  2.70 cm MITRAL VALVE                         TRICUSPID VALVE MV Area (PHT): 4.21 cm              TR Peak grad:   31.6 mmHg MV PHT:        52.20 msec            TR Vmax:        281.00 cm/s MV Decel Time: 180 msec MV E velocity: 111.00 cm/s 103 cm/s  SHUNTS MV A velocity: 88.80 cm/s  70.3 cm/s Systemic VTI:  0.13 m MV E/A ratio:  1.25        1.5       Systemic Diam: 1.90 cm  Oswaldo Milian MD Electronically signed by Oswaldo Milian MD Signature Date/Time: 11/22/2019/11:05:42 AM    Final     Patient Profile     86 y.o. female with nonischemic cardiomyopathy, history of sarcoidosis admitted with acute on chronic systolic congestive heart failure.  Assessment & Plan    1 acute on chronic systolic congestive heart failure-I/O-68; wt 67 kg.  Patient remains volume overloaded.  Increase Lasix to 120 mg IV twice daily.  Follow renal function closely.  I have not added spironolactone due to baseline renal insufficiency.  2 nonischemic cardiomyopathy-no ACE inhibitor or ARB given history of angioedema.  At present blood pressure will not allow hydralazine/nitrates.  Beta-blocker on hold in the setting of acute congestive heart failure.  3 chronic stage III kidney  disease-follow renal function closely with diuresis.  4 history of pulmonary sarcoidosis  For questions or updates, please contact [place] Please consult [email or web address] for contact info under        Signed, [name], MD  11/23/2019, 7:45 AM

## 2019-11-24 ENCOUNTER — Inpatient Hospital Stay (HOSPITAL_COMMUNITY): Payer: Medicare Other

## 2019-11-24 DIAGNOSIS — I428 Other cardiomyopathies: Secondary | ICD-10-CM

## 2019-11-24 LAB — BASIC METABOLIC PANEL
Anion gap: 9 (ref 5–15)
BUN: 39 mg/dL — ABNORMAL HIGH (ref 8–23)
CO2: 30 mmol/L (ref 22–32)
Calcium: 9.1 mg/dL (ref 8.9–10.3)
Chloride: 97 mmol/L — ABNORMAL LOW (ref 98–111)
Creatinine, Ser: 1.45 mg/dL — ABNORMAL HIGH (ref 0.44–1.00)
GFR calc Af Amer: 39 mL/min — ABNORMAL LOW (ref >60)
GFR calc non Af Amer: 33 mL/min — ABNORMAL LOW (ref >60)
Glucose, Bld: 109 mg/dL — ABNORMAL HIGH (ref 70–99)
Potassium: 3.9 mmol/L (ref 3.5–5.1)
Sodium: 136 mmol/L (ref 135–145)

## 2019-11-24 LAB — ECHOCARDIOGRAM LIMITED
Height: 63 in
Weight: 2373.91 oz

## 2019-11-24 MED ORDER — PERFLUTREN LIPID MICROSPHERE
1.0000 mL | INTRAVENOUS | Status: AC | PRN
  Administered 2019-11-24: 2 mL via INTRAVENOUS
  Filled 2019-11-24: qty 10

## 2019-11-24 MED ORDER — POTASSIUM CHLORIDE CRYS ER 20 MEQ PO TBCR
40.0000 meq | EXTENDED_RELEASE_TABLET | Freq: Once | ORAL | Status: AC
  Administered 2019-11-24: 40 meq via ORAL
  Filled 2019-11-24: qty 2

## 2019-11-24 NOTE — Progress Notes (Signed)
Progress Note  Patient Name: Crystal [name] Date of Encounter: 11/24/2019  Primary Cardiologist: [name], MD   Subjective   Remains dyspneic; no chest pain  Inpatient Medications    Scheduled Meds: . aspirin EC  81 mg Oral Daily  . furosemide  80 mg Intravenous Once  . heparin  5,000 Units Subcutaneous Q8H  . levothyroxine  50 mcg Oral Daily  . multivitamin with minerals  1 tablet Oral Daily  . sodium chloride flush  3 mL Intravenous Q12H   Continuous Infusions: . sodium chloride    . furosemide 120 mg (11/23/19 2019)  . milrinone 0.125 mcg/kg/min (11/23/19 1528)   PRN Meds: sodium chloride, acetaminophen, albuterol, ALPRAZolam, magnesium hydroxide, ondansetron (ZOFRAN) IV, sodium chloride flush   Vital Signs    Vitals:   11/23/19 1700 11/23/19 1709 11/23/19 2328 11/24/19 0336  BP: 119/79 121/75 119/73 101/74  Pulse: 79 63 100 99  Resp: (!) 24 (!) 24 (!) 26 (!) 26  Temp:   (!) 97.4 F (36.3 C) 97.8 F (36.6 C)  TempSrc:   Axillary Oral  SpO2: 100% 100% 99% 99%  Weight:    67.3 kg  Height:        Intake/Output Summary (Last 24 hours) at 11/24/2019 0803 Last data filed at 11/24/2019 0339 Gross per 24 hour  Intake 438.86 ml  Output 2907 ml  Net -2468.14 ml   Last 3 Weights 11/24/2019 11/23/2019 11/22/2019  Weight (lbs) 148 lb 5.9 oz 147 lb 11.3 oz 156 lb 12 oz  Weight (kg) 67.3 kg 67 kg 71.1 kg      Telemetry    Sinus with PACs and PVCs- Personally Reviewed  Physical Exam   GEN: WD, elderly, mildly dyspneic Neck: positive JVD, supple Cardiac: irregular Respiratory: Diminished BS bases; no rhochi GI: Soft, NT/ND, no masses  MS: 3+ edema Neuro:  No focal findings Psych: Normal affect   Labs    Chemistry Recent Labs  Lab 11/23/19 0221 11/23/19 1825 11/24/19 0203  NA 135 135 136  K 4.2 3.8 3.9  CL 98 95* 97*  CO2 28 32 30  GLUCOSE 99 126* 109*  BUN 37* 37* 39*  CREATININE 1.69* 1.51* 1.45*  CALCIUM 9.2 9.1 9.1  GFRNONAA 28* 32* 33*    GFRAA 32* 37* 39*  ANIONGAP 9 8 9      Hematology Recent Labs  Lab 11/21/19 1321  WBC 4.3  RBC 4.28  HGB 13.0  HCT 41.2  MCV 96.3  MCH 30.4  MCHC 31.6  RDW 15.9*  PLT 91*    BNP Recent Labs  Lab 11/21/19 1321  BNP 3,536.1*     Radiology    DG CHEST PORT 1 VIEW  Result Date: 11/23/2019 CLINICAL DATA:  Shortness of breath and tachypnea EXAM: PORTABLE CHEST 1 VIEW COMPARISON:  Yesterday FINDINGS: Bilateral pleural effusion, likely moderate. Linear opacity with volume loss in the right mid lung following the minor fissure. There is a band of scarring also seen in the left perihilar lung. Largely obscured heart size. There is mediastinal widening from chronic adenopathy. Patient has history of treated lymphoma. IMPRESSION: 1. Moderate pleural effusions and lung scarring that is stable. 2. Chronic mediastinal adenopathy in this patient with history of treated lymphoma. Electronically Signed   By: Monte Fantasia M.D.   On: 11/23/2019 11:28   ECHOCARDIOGRAM COMPLETE  Result Date: 11/22/2019   ECHOCARDIOGRAM REPORT   Patient Name:   Crystal [name] Date of Exam: 11/22/2019 Medical Rec #:  [id number]      Height:       63.0 in Accession #:    [id number]     Weight:       156.7 lb Date of Birth:  Dec 08, 1933      BSA:          1.74 m Patient Age:    8 years       BP:           91/57 mmHg Patient Gender: F              HR:           68 bpm. Exam Location:  Inpatient Procedure: 2D Echo, 3D Echo, Color Doppler and Cardiac Doppler Indications:    R06.9 DOE  History:        Patient has prior history of Echocardiogram examinations, most                 recent 11/30/2018. CHF; Risk Factors:Hypertension and                 Dyslipidemia.  Sonographer:    Raquel Sarna Senior RDCS Referring Phys: [place]  1. Left ventricular ejection fraction, by visual estimation, is 30 to 35%. The left ventricle has moderate to severely decreased function. There is no left ventricular hypertrophy.  2. The  left ventricle demonstrates global hypokinesis with inferior/inferoseptal akinesis. Apex with significant trabeculations, no clear thrombus seen but would consider limited TTE with contrast to rule out apical thrombus  3. Left ventricular diastolic parameters are consistent with Grade II diastolic dysfunction (pseudonormalization).  4. Elevated left atrial pressure.  5. Global right ventricle has moderately reduced systolic function.The right ventricular size is mildly enlarged.  6. Moderate pleural effusion in the left lateral region.  7. The mitral valve is normal in structure. Mild mitral valve regurgitation.  8. The tricuspid valve is normal in structure.  9. The aortic valve is abnormal. Moderately thickened leaflets. Aortic valve regurgitation is not visualized. Mild to moderate aortic valve sclerosis/calcification without any evidence of aortic stenosis. 10. The pulmonic valve was grossly normal. Pulmonic valve regurgitation is trivial. 11. Left atrial size was mildly dilated. 12. Right atrial size was moderately dilated. 13. The inferior vena cava is normal in size with <50% respiratory variability, suggesting right atrial pressure of 8 mmHg. 14. The tricuspid regurgitant velocity is 2.81 m/s, and with an assumed right atrial pressure of 8 mmHg, the estimated right ventricular systolic pressure is mildly elevated at 39.6 mmHg. FINDINGS  Left Ventricle: Left ventricular ejection fraction, by visual estimation, is 30 to 35%. The left ventricle has moderate to severely decreased function. The left ventricle demonstrates global hypokinesis. There is no left ventricular hypertrophy. Left ventricular diastolic parameters are consistent with Grade II diastolic dysfunction (pseudonormalization). Elevated left atrial pressure. Right Ventricle: The right ventricular size is mildly enlarged. No increase in right ventricular wall thickness. Global RV systolic function is has moderately reduced systolic function. The  tricuspid regurgitant velocity is 2.81 m/s, and with an assumed right atrial pressure of 8 mmHg, the estimated right ventricular systolic pressure is mildly elevated at 39.6 mmHg. Left Atrium: Left atrial size was mildly dilated. Right Atrium: Right atrial size was moderately dilated Pericardium: Trivial pericardial effusion is present. There is a moderate pleural effusion in the left lateral region. Mitral Valve: The mitral valve is normal in structure. Mild mitral valve regurgitation. Tricuspid Valve: The tricuspid valve is normal in structure. Tricuspid valve regurgitation  is mild. Aortic Valve: The aortic valve is abnormal. . There is moderate thickening of the aortic valve. Aortic valve regurgitation is not visualized. Mild to moderate aortic valve sclerosis/calcification is present, without any evidence of aortic stenosis. There  is moderate thickening of the aortic valve. Pulmonic Valve: The pulmonic valve was grossly normal. Pulmonic valve regurgitation is trivial. Pulmonic regurgitation is trivial. Aorta: The aortic root and ascending aorta are structurally normal, with no evidence of dilitation. Venous: The inferior vena cava is normal in size with less than 50% respiratory variability, suggesting right atrial pressure of 8 mmHg. IAS/Shunts: The interatrial septum was not well visualized.  LEFT VENTRICLE PLAX 2D LVIDd:         5.10 cm       Diastology LVIDs:         4.20 cm       LV e' lateral:   6.87 cm/s LV PW:         0.90 cm       LV E/e' lateral: 16.2 LV IVS:        0.60 cm       LV e' medial:    3.37 cm/s LVOT diam:     1.90 cm       LV E/e' medial:  32.9 LV SV:         45 ml LV SV Index:   25.16 LVOT Area:     2.84 cm                               3D Volume EF: LV Volumes (MOD)             3D EF:        32 % LV area d, A2C:    34.10 cm LV EDV:       172 ml LV area d, A4C:    33.00 cm LV ESV:       117 ml LV area s, A2C:    28.20 cm LV SV:        55 ml LV area s, A4C:    26.40 cm LV major d,  A2C:   8.58 cm LV major d, A4C:   7.93 cm LV major s, A2C:   8.00 cm LV major s, A4C:   7.64 cm LV vol d, MOD A2C: 112.0 ml LV vol d, MOD A4C: 114.0 ml LV vol s, MOD A2C: 84.2 ml LV vol s, MOD A4C: 76.4 ml LV SV MOD A2C:     27.8 ml LV SV MOD A4C:     114.0 ml LV SV MOD BP:      35.6 ml RIGHT VENTRICLE RV S prime:     5.36 cm/s TAPSE (M-mode): 1.2 cm LEFT ATRIUM             Index       RIGHT ATRIUM           Index LA diam:        2.60 cm 1.49 cm/m  RA Area:     19.60 cm LA Vol (A2C):   57.8 ml 33.15 ml/m RA Volume:   64.10 ml  36.77 ml/m LA Vol (A4C):   59.5 ml 34.13 ml/m LA Biplane Vol: 60.1 ml 34.47 ml/m  AORTIC VALVE LVOT Vmax:   69.00 cm/s LVOT Vmean:  44.200 cm/s LVOT VTI:    0.129 m  AORTA Ao Root diam: 2.60 cm Ao  Asc diam:  2.70 cm MITRAL VALVE                         TRICUSPID VALVE MV Area (PHT): 4.21 cm              TR Peak grad:   31.6 mmHg MV PHT:        52.20 msec            TR Vmax:        281.00 cm/s MV Decel Time: 180 msec MV E velocity: 111.00 cm/s 103 cm/s  SHUNTS MV A velocity: 88.80 cm/s  70.3 cm/s Systemic VTI:  0.13 m MV E/A ratio:  1.25        1.5       Systemic Diam: 1.90 cm  Oswaldo Milian MD Electronically signed by Oswaldo Milian MD Signature Date/Time: 11/22/2019/11:05:42 AM    Final     Patient Profile     86 y.o. female with nonischemic cardiomyopathy, history of sarcoidosis admitted with acute on chronic systolic congestive heart failure.  Assessment & Plan    1 acute on chronic systolic congestive heart failure-I/O-2462; wt 67.3 kg.  Patient remains volume overloaded but improved compared to yesterday.  Continue lasix at present dose; continue milrinone; follow renal function.  2 nonischemic cardiomyopathy-no ACE inhibitor or ARB given history of angioedema.  Add hydralazine/nitrates later if BP allows. Beta-blocker on hold in the setting of acute congestive heart failure.  3 chronic stage III kidney disease-follow renal function closely with  diuresis.  4 history of pulmonary sarcoidosis  For questions or updates, please contact [place] Please consult [email or web address] for contact info under        Signed, [name], MD  11/24/2019, 8:03 AM

## 2019-11-24 NOTE — TOC Progression Note (Signed)
Transition of Care [facility]) - Progression Note    Patient Details  Name: Crystal [name] MRN: [id number] Date of Birth: October 01, 1934  Transition of Care  [facility]) CM/SW Contact  Zenon Mayo, RN Phone Number: 11/24/2019, 1:50 PM  Clinical Narrative:    NCM spoke with patient, he referred me to speak with his wife, she was at the bedside.  NCM asked if would like to be set up with [facility] for CHF management, she states not at this time.  TOC team will continue to follow for dc needs.        Expected Discharge Plan and Services                                                 Social Determinants of Health (SDOH) Interventions    Readmission Risk Interventions No flowsheet data found.

## 2019-11-24 NOTE — Progress Notes (Signed)
  Echocardiogram 2D Echocardiogram limited with Definity has been performed.  Jennette Dubin 11/24/2019, 3:26 PM

## 2019-11-24 NOTE — Progress Notes (Signed)
Cooxemetry panel ordered. No CVAD

## 2019-11-25 LAB — BASIC METABOLIC PANEL
Anion gap: 10 (ref 5–15)
BUN: 37 mg/dL — ABNORMAL HIGH (ref 8–23)
CO2: 29 mmol/L (ref 22–32)
Calcium: 8.7 mg/dL — ABNORMAL LOW (ref 8.9–10.3)
Chloride: 98 mmol/L (ref 98–111)
Creatinine, Ser: 1.38 mg/dL — ABNORMAL HIGH (ref 0.44–1.00)
GFR calc Af Amer: 41 mL/min — ABNORMAL LOW (ref >60)
GFR calc non Af Amer: 35 mL/min — ABNORMAL LOW (ref >60)
Glucose, Bld: 107 mg/dL — ABNORMAL HIGH (ref 70–99)
Potassium: 3.9 mmol/L (ref 3.5–5.1)
Sodium: 137 mmol/L (ref 135–145)

## 2019-11-25 MED ORDER — METOLAZONE 2.5 MG PO TABS
2.5000 mg | ORAL_TABLET | Freq: Every day | ORAL | Status: DC
  Administered 2019-11-25 – 2019-11-26 (×2): 2.5 mg via ORAL
  Filled 2019-11-25 (×2): qty 1

## 2019-11-25 MED ORDER — POTASSIUM CHLORIDE CRYS ER 20 MEQ PO TBCR
40.0000 meq | EXTENDED_RELEASE_TABLET | Freq: Once | ORAL | Status: AC
  Administered 2019-11-25: 40 meq via ORAL
  Filled 2019-11-25: qty 2

## 2019-11-25 NOTE — Progress Notes (Signed)
Progress Note  Patient Name: Crystal [name] Date of Encounter: 11/25/2019  Primary Cardiologist: [name], MD   Subjective   Dyspnea improving; no CP  Inpatient Medications    Scheduled Meds: . aspirin EC  81 mg Oral Daily  . furosemide  80 mg Intravenous Once  . heparin  5,000 Units Subcutaneous Q8H  . levothyroxine  50 mcg Oral Daily  . multivitamin with minerals  1 tablet Oral Daily  . sodium chloride flush  3 mL Intravenous Q12H   Continuous Infusions: . sodium chloride 250 mL (11/24/19 0941)  . furosemide 120 mg (11/24/19 1704)  . milrinone 0.125 mcg/kg/min (11/24/19 2008)   PRN Meds: sodium chloride, acetaminophen, albuterol, ALPRAZolam, magnesium hydroxide, ondansetron (ZOFRAN) IV, sodium chloride flush   Vital Signs    Vitals:   11/24/19 2004 11/24/19 2245 11/24/19 2352 11/25/19 0433  BP: 103/69 108/82 110/74 112/72  Pulse: 100  (!) 105 (!) 101  Resp: (!) 37  20 20  Temp: 98 F (36.7 C) 97.9 F (36.6 C) 98.1 F (36.7 C) 98.4 F (36.9 C)  TempSrc: Oral Oral Oral Oral  SpO2: 100%  100% 100%  Weight:    65.1 kg  Height:        Intake/Output Summary (Last 24 hours) at 11/25/2019 0750 Last data filed at 11/25/2019 0434 Gross per 24 hour  Intake 430 ml  Output 1075 ml  Net -645 ml   Last 3 Weights 11/25/2019 11/24/2019 11/23/2019  Weight (lbs) 143 lb 8.3 oz 148 lb 5.9 oz 147 lb 11.3 oz  Weight (kg) 65.1 kg 67.3 kg 67 kg      Telemetry    Sinus with PACs and PVCs; NSVT- Personally Reviewed  Physical Exam   GEN: WD, NAD Neck: positive JVD Cardiac: irregular, no gallop Respiratory: Diminished BS bases; no wheeze GI: Soft, NT/ND MS: 2+ edema Neuro:  grossly intact Psych: Normal affect   Labs    Chemistry Recent Labs  Lab 11/23/19 1825 11/24/19 0203 11/25/19 0115  NA 135 136 137  K 3.8 3.9 3.9  CL 95* 97* 98  CO2 32 30 29  GLUCOSE 126* 109* 107*  BUN 37* 39* 37*  CREATININE 1.51* 1.45* 1.38*  CALCIUM 9.1 9.1 8.7*  GFRNONAA 32* 33*  35*  GFRAA 37* 39* 41*  ANIONGAP 8 9 10      Hematology Recent Labs  Lab 11/21/19 1321  WBC 4.3  RBC 4.28  HGB 13.0  HCT 41.2  MCV 96.3  MCH 30.4  MCHC 31.6  RDW 15.9*  PLT 91*    BNP Recent Labs  Lab 11/21/19 1321  BNP 3,536.1*     Radiology    DG CHEST PORT 1 VIEW  Result Date: 11/23/2019 CLINICAL DATA:  Shortness of breath and tachypnea EXAM: PORTABLE CHEST 1 VIEW COMPARISON:  Yesterday FINDINGS: Bilateral pleural effusion, likely moderate. Linear opacity with volume loss in the right mid lung following the minor fissure. There is a band of scarring also seen in the left perihilar lung. Largely obscured heart size. There is mediastinal widening from chronic adenopathy. Patient has history of treated lymphoma. IMPRESSION: 1. Moderate pleural effusions and lung scarring that is stable. 2. Chronic mediastinal adenopathy in this patient with history of treated lymphoma. Electronically Signed   By: Monte Fantasia M.D.   On: 11/23/2019 11:28   ECHOCARDIOGRAM LIMITED  Result Date: 11/24/2019   ECHOCARDIOGRAM LIMITED REPORT   Patient Name:   Crystal [name] Date of Exam: 11/24/2019 Medical Rec #:  [id number]      Height:       63.0 in Accession #:    [id number]     Weight:       148.4 lb Date of Birth:  February 17, 1934      BSA:          1.70 m Patient Age:    66 years       BP:           117/90 mmHg Patient Gender: F              HR:           99 bpm. Exam Location:  Inpatient  Procedure: Limited Echo and Intracardiac Opacification Agent Indications:    Cardiomyopathy; Limited study with Definity to rule out apical                 Thrombus.  History:        Patient has prior history of Echocardiogram examinations, most                 recent 11/21/2018.  Sonographer:    Mikki Santee RDCS (AE) Referring Phys: [place]  1. Limited exam with contrast to rule out apical thrombus  2. No apical thrombus seen  3. Left ventricular ejection fraction, by visual estimation, is 25  to 30%. The left ventricle has severely decreased function. There is no increased left ventricular wall thickness. FINDINGS  Left Ventricle: Left ventricular ejection fraction, by visual estimation, is 25 to 30%. The left ventricle has severely decreased function. There is no increased left ventricular wall thickness.  Oswaldo Milian MD Electronically signed by Oswaldo Milian MD Signature Date/Time: 11/24/2019/9:47:04 PM   Final     Patient Profile     86 y.o. female with nonischemic cardiomyopathy, history of sarcoidosis admitted with acute on chronic systolic congestive heart failure.  Assessment & Plan    1 acute on chronic systolic congestive heart failure-I/O-645; wt 65.1 kg.  Patient remains volume overloaded.  Continue lasix at present dose; add metolazone 2.5 mg daily; continue milrinone; follow renal function.  2 nonischemic cardiomyopathy-no ACE inhibitor or ARB given history of angioedema.  Add hydralazine/nitrates later if BP allows. Beta-blocker on hold in the setting of acute congestive heart failure.  3 chronic stage III kidney disease-follow renal function closely with diuresis.  4 history of pulmonary sarcoidosis  For questions or updates, please contact [place] Please consult [email or web address] for contact info under        Signed, [name], MD  11/25/2019, 7:50 AM

## 2019-11-25 NOTE — Plan of Care (Signed)

## 2019-11-26 LAB — BASIC METABOLIC PANEL
Anion gap: 8 (ref 5–15)
BUN: 37 mg/dL — ABNORMAL HIGH (ref 8–23)
CO2: 33 mmol/L — ABNORMAL HIGH (ref 22–32)
Calcium: 9.4 mg/dL (ref 8.9–10.3)
Chloride: 94 mmol/L — ABNORMAL LOW (ref 98–111)
Creatinine, Ser: 1.29 mg/dL — ABNORMAL HIGH (ref 0.44–1.00)
GFR calc Af Amer: 44 mL/min — ABNORMAL LOW (ref >60)
GFR calc non Af Amer: 38 mL/min — ABNORMAL LOW (ref >60)
Glucose, Bld: 110 mg/dL — ABNORMAL HIGH (ref 70–99)
Potassium: 3.9 mmol/L (ref 3.5–5.1)
Sodium: 135 mmol/L (ref 135–145)

## 2019-11-26 MED ORDER — HYDRALAZINE HCL 10 MG PO TABS
10.0000 mg | ORAL_TABLET | Freq: Three times a day (TID) | ORAL | Status: DC
  Administered 2019-11-26 – 2019-11-30 (×7): 10 mg via ORAL
  Filled 2019-11-26 (×9): qty 1

## 2019-11-26 MED ORDER — ISOSORBIDE MONONITRATE ER 30 MG PO TB24
15.0000 mg | ORAL_TABLET | Freq: Every day | ORAL | Status: DC
  Administered 2019-11-26 – 2019-11-30 (×5): 15 mg via ORAL
  Filled 2019-11-26 (×5): qty 1

## 2019-11-26 MED ORDER — SPIRONOLACTONE 12.5 MG HALF TABLET
12.5000 mg | ORAL_TABLET | Freq: Every day | ORAL | Status: DC
  Administered 2019-11-26 – 2019-11-30 (×5): 12.5 mg via ORAL
  Filled 2019-11-26 (×5): qty 1

## 2019-11-26 NOTE — Plan of Care (Signed)

## 2019-11-26 NOTE — Plan of Care (Signed)

## 2019-11-26 NOTE — Progress Notes (Signed)
Progress Note  Patient Name: Crystal [name] Date of Encounter: 11/26/2019  Primary Cardiologist: [name], MD   Subjective   Dyspnea continues to improve; no CP  Inpatient Medications    Scheduled Meds: . aspirin EC  81 mg Oral Daily  . furosemide  80 mg Intravenous Once  . heparin  5,000 Units Subcutaneous Q8H  . levothyroxine  50 mcg Oral Daily  . metolazone  2.5 mg Oral Daily  . multivitamin with minerals  1 tablet Oral Daily  . sodium chloride flush  3 mL Intravenous Q12H   Continuous Infusions: . sodium chloride 250 mL (11/24/19 0941)  . furosemide 120 mg (11/26/19 0744)  . milrinone 0.125 mcg/kg/min (11/26/19 0511)   PRN Meds: sodium chloride, acetaminophen, albuterol, ALPRAZolam, magnesium hydroxide, ondansetron (ZOFRAN) IV, sodium chloride flush   Vital Signs    Vitals:   11/26/19 0000 11/26/19 0327 11/26/19 0400 11/26/19 0737  BP: (!) 127/58 (!) 120/53 116/68 (!) 143/76  Pulse: (!) 107 94 100 100  Resp: (!) 32 15 19 19   Temp:    (!) 97.2 F (36.2 C)  TempSrc:    Oral  SpO2: 100% 100% 100% 100%  Weight:  64.4 kg    Height:        Intake/Output Summary (Last 24 hours) at 11/26/2019 0754 Last data filed at 11/26/2019 0617 Gross per 24 hour  Intake 807.28 ml  Output 4200 ml  Net -3392.72 ml   Last 3 Weights 11/26/2019 11/25/2019 11/24/2019  Weight (lbs) 141 lb 15.6 oz 143 lb 8.3 oz 148 lb 5.9 oz  Weight (kg) 64.4 kg 65.1 kg 67.3 kg      Telemetry    Sinus with PACs and PVCs; NSVT- Personally Reviewed  Physical Exam   GEN: WD, elderly, NAD Neck: positive JVD, supple Cardiac: irregular Respiratory: Diminished BS bases; no rhonchi GI: Soft, NT/ND, no masses MS: 2+ edema Neuro:  No focal findings Psych: Normal affect   Labs    Chemistry Recent Labs  Lab 11/24/19 0203 11/25/19 0115 11/26/19 0050  NA 136 137 135  K 3.9 3.9 3.9  CL 97* 98 94*  CO2 30 29 33*  GLUCOSE 109* 107* 110*  BUN 39* 37* 37*  CREATININE 1.45* 1.38* 1.29*    CALCIUM 9.1 8.7* 9.4  GFRNONAA 33* 35* 38*  GFRAA 39* 41* 44*  ANIONGAP 9 10 8      Hematology Recent Labs  Lab 11/21/19 1321  WBC 4.3  RBC 4.28  HGB 13.0  HCT 41.2  MCV 96.3  MCH 30.4  MCHC 31.6  RDW 15.9*  PLT 91*    BNP Recent Labs  Lab 11/21/19 1321  BNP 3,536.1*     Radiology    ECHOCARDIOGRAM LIMITED  Result Date: 11/24/2019   ECHOCARDIOGRAM LIMITED REPORT   Patient Name:   Crystal [name] Date of Exam: 11/24/2019 Medical Rec #:  [id number]      Height:       63.0 in Accession #:    [id number]     Weight:       148.4 lb Date of Birth:  1934-04-12      BSA:          1.70 m Patient Age:    23 years       BP:           117/90 mmHg Patient Gender: F              HR:  99 bpm. Exam Location:  Inpatient  Procedure: Limited Echo and Intracardiac Opacification Agent Indications:    Cardiomyopathy; Limited study with Definity to rule out apical                 Thrombus.  History:        Patient has prior history of Echocardiogram examinations, most                 recent 11/21/2018.  Sonographer:    Mikki Santee RDCS (AE) Referring Phys: [place]  1. Limited exam with contrast to rule out apical thrombus  2. No apical thrombus seen  3. Left ventricular ejection fraction, by visual estimation, is 25 to 30%. The left ventricle has severely decreased function. There is no increased left ventricular wall thickness. FINDINGS  Left Ventricle: Left ventricular ejection fraction, by visual estimation, is 25 to 30%. The left ventricle has severely decreased function. There is no increased left ventricular wall thickness.  Oswaldo Milian MD Electronically signed by Oswaldo Milian MD Signature Date/Time: 11/24/2019/9:47:04 PM   Final     Patient Profile     86 y.o. female with nonischemic cardiomyopathy, history of sarcoidosis admitted with acute on chronic systolic congestive heart failure.  Echocardiogram shows ejection fraction 30 to 35%, grade 2  diastolic dysfunction, moderate RV dysfunction, mild mitral regurgitation.  Assessment & Plan    1 acute on chronic systolic congestive heart failure-I/O-3330; wt 64.4 kg.  Volume status slowly improving but persists. Continue lasix at present dose; add spironolactone 12.5 mg daily; continue metolazone but will likely DC in next 24-48 hours; continue milrinone; can likely DC 24-48 hours; follow renal function.  2 nonischemic cardiomyopathy-no ACE inhibitor or ARB given history of angioedema.  Add hydralazine 10 mg TID and imdur 15 mg daily as BP better; will add low dose coreg when acute CHF improves.  3 chronic stage III kidney disease-follow renal function closely with diuresis.  4 history of pulmonary sarcoidosis  For questions or updates, please contact [place] Please consult [email or web address] for contact info under        Signed, [name], MD  11/26/2019, 7:54 AM

## 2019-11-26 NOTE — Progress Notes (Signed)
Pt sat on a recliner pretty much whole day, tolerated well, IV lasix continue, IV Milrinone continue, Pt denies chest pain, oxygen saturation on 95-100 at 2l oxygen, tylenol given for a complain of a right foot pain, it worked, husband was in bed side and was updated, will continue to monitor the patient  [place] Holter, Therapist, sports

## 2019-11-27 ENCOUNTER — Inpatient Hospital Stay (HOSPITAL_COMMUNITY): Payer: Medicare Other

## 2019-11-27 DIAGNOSIS — N1832 Chronic kidney disease, stage 3b: Secondary | ICD-10-CM

## 2019-11-27 DIAGNOSIS — I5021 Acute systolic (congestive) heart failure: Secondary | ICD-10-CM

## 2019-11-27 LAB — BASIC METABOLIC PANEL
Anion gap: 7 (ref 5–15)
BUN: 36 mg/dL — ABNORMAL HIGH (ref 8–23)
CO2: 38 mmol/L — ABNORMAL HIGH (ref 22–32)
Calcium: 9.4 mg/dL (ref 8.9–10.3)
Chloride: 91 mmol/L — ABNORMAL LOW (ref 98–111)
Creatinine, Ser: 1.37 mg/dL — ABNORMAL HIGH (ref 0.44–1.00)
GFR calc Af Amer: 41 mL/min — ABNORMAL LOW (ref >60)
GFR calc non Af Amer: 36 mL/min — ABNORMAL LOW (ref >60)
Glucose, Bld: 81 mg/dL (ref 70–99)
Potassium: 3.7 mmol/L (ref 3.5–5.1)
Sodium: 136 mmol/L (ref 135–145)

## 2019-11-27 MED ORDER — METOLAZONE 2.5 MG PO TABS: 2.5000 mg | ORAL_TABLET | ORAL | Status: DC

## 2019-11-27 MED ORDER — FUROSEMIDE 10 MG/ML IJ SOLN
80.0000 mg | Freq: Two times a day (BID) | INTRAMUSCULAR | Status: DC
  Administered 2019-11-27 (×2): 80 mg via INTRAVENOUS
  Filled 2019-11-27 (×2): qty 8

## 2019-11-27 NOTE — Plan of Care (Signed)
Discussed POC with pt this morning, pt understands use of diuretics to removed the excess fluid she had gained, and that MD decreased her dose today. Continue POC.

## 2019-11-27 NOTE — Progress Notes (Signed)
Progress Note  Patient Name: Crystal [name] Date of Encounter: 11/27/2019  Primary Cardiologist: [name], MD   Subjective   Feels much better today. Breathing is much improved. No cough or chest pain. Sat up in chair yesterday.   Inpatient Medications    Scheduled Meds: . aspirin EC  81 mg Oral Daily  . furosemide  80 mg Intravenous BID  . heparin  5,000 Units Subcutaneous Q8H  . hydrALAZINE  10 mg Oral Q8H  . isosorbide mononitrate  15 mg Oral Daily  . levothyroxine  50 mcg Oral Daily  . [START ON 11/28/2019] metolazone  2.5 mg Oral QODAY  . multivitamin with minerals  1 tablet Oral Daily  . sodium chloride flush  3 mL Intravenous Q12H  . spironolactone  12.5 mg Oral Daily   Continuous Infusions: . sodium chloride 250 mL (11/24/19 0941)  . milrinone 0.125 mcg/kg/min (11/27/19 0400)   PRN Meds: sodium chloride, acetaminophen, albuterol, ALPRAZolam, magnesium hydroxide, ondansetron (ZOFRAN) IV, sodium chloride flush   Vital Signs    Vitals:   11/27/19 0327 11/27/19 0400 11/27/19 0612 11/27/19 0728  BP: (!) 114/59 103/80 107/72 140/65  Pulse: 97 82  (!) 108  Resp: 19 18  18   Temp: (!) 97.4 F (36.3 C)   (!) 97.4 F (36.3 C)  TempSrc: Oral   Oral  SpO2: 100% (!) 89%  100%  Weight: 61.1 kg     Height:        Intake/Output Summary (Last 24 hours) at 11/27/2019 0753 Last data filed at 11/27/2019 0600 Gross per 24 hour  Intake 755.25 ml  Output 4350 ml  Net -3594.75 ml   Last 3 Weights 11/27/2019 11/26/2019 11/25/2019  Weight (lbs) 134 lb 11.2 oz 141 lb 15.6 oz 143 lb 8.3 oz  Weight (kg) 61.1 kg 64.4 kg 65.1 kg      Telemetry    Sinus tachy with PVCs, 4-5 beat run of NSVT, one brief run of SVT - Personally Reviewed  ECG    none - Personally Reviewed  Physical Exam   GEN: Elderly BF, No acute distress.   Neck: Mild  JVD Cardiac: IRRR, no murmurs, + gallop Respiratory: decreased BS in left base.  GI: Soft, nontender, non-distended  MS: 1+ edema; No  deformity. Neuro:  Nonfocal  Psych: Normal affect   Labs    High Sensitivity Troponin:  No results for input(s): TROPONINIHS in the last 720 hours.    Chemistry Recent Labs  Lab 11/25/19 0115 11/26/19 0050 11/27/19 0212  NA 137 135 136  K 3.9 3.9 3.7  CL 98 94* 91*  CO2 29 33* 38*  GLUCOSE 107* 110* 81  BUN 37* 37* 36*  CREATININE 1.38* 1.29* 1.37*  CALCIUM 8.7* 9.4 9.4  GFRNONAA 35* 38* 36*  GFRAA 41* 44* 41*  ANIONGAP 10 8 7      Hematology Recent Labs  Lab 11/21/19 1321  WBC 4.3  RBC 4.28  HGB 13.0  HCT 41.2  MCV 96.3  MCH 30.4  MCHC 31.6  RDW 15.9*  PLT 91*    BNP Recent Labs  Lab 11/21/19 1321  BNP 3,536.1*     DDimer No results for input(s): DDIMER in the last 168 hours.   Radiology    No results found.  Cardiac Studies   Echo 11/22/19: IMPRESSIONS    1. Left ventricular ejection fraction, by visual estimation, is 30 to 35%. The left ventricle has moderate to severely decreased function. There is no left  ventricular hypertrophy.  2. The left ventricle demonstrates global hypokinesis with inferior/inferoseptal akinesis. Apex with significant trabeculations, no clear thrombus seen but would consider limited TTE with contrast to rule out apical thrombus  3. Left ventricular diastolic parameters are consistent with Grade II diastolic dysfunction (pseudonormalization).  4. Elevated left atrial pressure.  5. Global right ventricle has moderately reduced systolic function.The right ventricular size is mildly enlarged.  6. Moderate pleural effusion in the left lateral region.  7. The mitral valve is normal in structure. Mild mitral valve regurgitation.  8. The tricuspid valve is normal in structure.  9. The aortic valve is abnormal. Moderately thickened leaflets. Aortic valve regurgitation is not visualized. Mild to moderate aortic valve sclerosis/calcification without any evidence of aortic stenosis. 10. The pulmonic valve was grossly normal.  Pulmonic valve regurgitation is trivial. 11. Left atrial size was mildly dilated. 12. Right atrial size was moderately dilated. 13. The inferior vena cava is normal in size with <50% respiratory variability, suggesting right atrial pressure of 8 mmHg. 14. The tricuspid regurgitant velocity is 2.81 m/s, and with an assumed right atrial pressure of 8 mmHg, the estimated right ventricular systolic pressure is mildly elevated at 39.6 mmHg.   Echo 11/24/19: IMPRESSIONS    1. Limited exam with contrast to rule out apical thrombus  2. No apical thrombus seen  3. Left ventricular ejection fraction, by visual estimation, is 25 to 30%. The left ventricle has severely decreased function. There is no increased left ventricular wall thickness.  FINDINGS  Left Ventricle: Left ventricular ejection fraction, by visual estimation, is 25 to 30%. The left ventricle has severely decreased function. There is no increased left ventricular wall thickness.    Oswaldo Milian MD Electronically signed by Oswaldo Milian MD Signature Date/Time: 11/24/2019/9:47:04 PM  Patient Profile     86 y.o. female with nonischemic cardiomyopathy, history of sarcoidosis admitted with acute on chronic systolic congestive heart failure.   Assessment & Plan    1.  Acute on chronic systolic congestive heart failure EF 30%. I/O-3594 yesterday, down 9.9 liters since admit; wt down 13 lbs since admit. Clinically much better than when I saw her in the office last week. still has volume excess. Renal function is stable but developing a contraction alkalosis with diuresis. Will reduce lasix to 80 mg bid. Reduce metolazone to every other day. Repeat CXR today. could consider left thoracentesis if significant pleural effusion persists. On low dose hydralazine, nitrates and spironolactone. Continue milrinone today- likely discontinue tomorrow. Increase activity. Phase 1 cardiac Rehab.   2 nonischemic cardiomyopathy-no ACE  inhibitor or ARB given history of angioedema.  on  hydralazine 10 mg TID, spironolactone 12.5 mg daily and imdur 15 mg daily as BP better; will add low dose coreg when acute CHF improves.  3 chronic stage III kidney disease-follow renal function closely with diuresis.  4 history of pulmonary sarcoidosis  For questions or updates, please contact [place] Please consult [email or web address] for contact info under        Signed, Quentina Fronek [place], MD  11/27/2019, 7:53 AM

## 2019-11-27 NOTE — Progress Notes (Signed)
CARDIAC REHAB PHASE I   PRE:  Rate/Rhythm: 116 ST with PVCs and PACs  BP:  Supine: 101/72  Sitting:   Standing:    SaO2: 96%RA  MODE:  Ambulation: 170 ft   POST:  Rate/Rhythm: 130-140 ?ST with lots of PVCs , pairs PVCs and PACs  BP:  Supine:   Sitting: 92/61  Standing:    SaO2: 100% 2L 1020-1110 Pt with oxygen but on RA. Stated she is on home oxygen. Put back on 2L even though sats were stable on RA. Pt stated she has been on home oxygen for a while. Pt assisted to [facility] after purewick discontinued. Pt cleaned and then pt walked 170 ft on 2L with rolling walker, gait belt use, and asst x 2. Pt with no complaints. Stated this was first time she had walked in about a week. Increased heart rate with walk with increase in ectopy. To recliner after walk. Notified staff that [place] not attached. Left on 2L. Pt demonstrated 500 ml on IS. Call bell in reach.   Graylon Good, RN BSN  11/27/2019 11:06 AM

## 2019-11-28 LAB — BASIC METABOLIC PANEL
Anion gap: 11 (ref 5–15)
BUN: 40 mg/dL — ABNORMAL HIGH (ref 8–23)
CO2: 37 mmol/L — ABNORMAL HIGH (ref 22–32)
Calcium: 9.6 mg/dL (ref 8.9–10.3)
Chloride: 87 mmol/L — ABNORMAL LOW (ref 98–111)
Creatinine, Ser: 1.43 mg/dL — ABNORMAL HIGH (ref 0.44–1.00)
GFR calc Af Amer: 39 mL/min — ABNORMAL LOW (ref >60)
GFR calc non Af Amer: 34 mL/min — ABNORMAL LOW (ref >60)
Glucose, Bld: 88 mg/dL (ref 70–99)
Potassium: 3.7 mmol/L (ref 3.5–5.1)
Sodium: 135 mmol/L (ref 135–145)

## 2019-11-28 MED ORDER — FUROSEMIDE 10 MG/ML IJ SOLN
40.0000 mg | Freq: Once | INTRAMUSCULAR | Status: AC
  Administered 2019-11-28: 40 mg via INTRAVENOUS
  Filled 2019-11-28: qty 4

## 2019-11-28 NOTE — Progress Notes (Signed)
Progress Note  Patient Name: Crystal [name] Date of Encounter: 11/28/2019  Primary Cardiologist: [name], MD   Subjective   Feels much better today. Breathing is  improved. No cough or chest pain. States she did well with walking yesterday.  Inpatient Medications    Scheduled Meds: . aspirin EC  81 mg Oral Daily  . furosemide  40 mg Intravenous Once  . heparin  5,000 Units Subcutaneous Q8H  . hydrALAZINE  10 mg Oral Q8H  . isosorbide mononitrate  15 mg Oral Daily  . levothyroxine  50 mcg Oral Daily  . multivitamin with minerals  1 tablet Oral Daily  . sodium chloride flush  3 mL Intravenous Q12H  . spironolactone  12.5 mg Oral Daily   Continuous Infusions: . sodium chloride 250 mL (11/24/19 0941)   PRN Meds: sodium chloride, acetaminophen, albuterol, ALPRAZolam, magnesium hydroxide, ondansetron (ZOFRAN) IV, sodium chloride flush   Vital Signs    Vitals:   11/28/19 0341 11/28/19 0412 11/28/19 0445 11/28/19 0530  BP:   99/61 92/61  Pulse:   95   Resp:   20   Temp: 97.9 F (36.6 C)     TempSrc: Oral     SpO2:   100%   Weight:  53.7 kg    Height:        Intake/Output Summary (Last 24 hours) at 11/28/2019 0742 Last data filed at 11/28/2019 0438 Gross per 24 hour  Intake 419.75 ml  Output 800 ml  Net -380.25 ml   Last 3 Weights 11/28/2019 11/27/2019 11/26/2019  Weight (lbs) 118 lb 6.2 oz 134 lb 11.2 oz 141 lb 15.6 oz  Weight (kg) 53.7 kg 61.1 kg 64.4 kg      Telemetry    Sinus tachy with PVCs, 4-5 beat runs of NSVT Personally Reviewed  ECG    none - Personally Reviewed  Physical Exam   GEN: Elderly BF, No acute distress.   Neck: No JVD Cardiac: IRRR, no murmurs, + gallop Respiratory: mildly decreased BS in left base.  GI: Soft, nontender, non-distended  MS: tr edema; No deformity. Neuro:  Nonfocal  Psych: Normal affect   Labs    High Sensitivity Troponin:  No results for input(s): TROPONINIHS in the last 720 hours.    Chemistry Recent  Labs  Lab 11/26/19 0050 11/27/19 0212 11/28/19 0219  NA 135 136 135  K 3.9 3.7 3.7  CL 94* 91* 87*  CO2 33* 38* 37*  GLUCOSE 110* 81 88  BUN 37* 36* 40*  CREATININE 1.29* 1.37* 1.43*  CALCIUM 9.4 9.4 9.6  GFRNONAA 38* 36* 34*  GFRAA 44* 41* 39*  ANIONGAP 8 7 11      Hematology Recent Labs  Lab 11/21/19 1321  WBC 4.3  RBC 4.28  HGB 13.0  HCT 41.2  MCV 96.3  MCH 30.4  MCHC 31.6  RDW 15.9*  PLT 91*    BNP Recent Labs  Lab 11/21/19 1321  BNP 3,536.1*     DDimer No results for input(s): DDIMER in the last 168 hours.   Radiology    CXR PA and lateral  Result Date: 11/27/2019 CLINICAL DATA:  Congestive heart failure. Slightly productive cough EXAM: CHEST - 2 VIEW COMPARISON:  11/23/2019 FINDINGS: Normal heart size. Aortic atherosclerosis. Moderate left pleural effusion and small right pleural effusion identified. The right pleural effusion is decreased in volume from previous exam. IMPRESSION: Bilateral pleural effusions, moderate on the left and small on the right. Electronically Signed   By:  Kerby Moors M.D.   On: 11/27/2019 08:57    Cardiac Studies   Echo 11/22/19: IMPRESSIONS    1. Left ventricular ejection fraction, by visual estimation, is 30 to 35%. The left ventricle has moderate to severely decreased function. There is no left ventricular hypertrophy.  2. The left ventricle demonstrates global hypokinesis with inferior/inferoseptal akinesis. Apex with significant trabeculations, no clear thrombus seen but would consider limited TTE with contrast to rule out apical thrombus  3. Left ventricular diastolic parameters are consistent with Grade II diastolic dysfunction (pseudonormalization).  4. Elevated left atrial pressure.  5. Global right ventricle has moderately reduced systolic function.The right ventricular size is mildly enlarged.  6. Moderate pleural effusion in the left lateral region.  7. The mitral valve is normal in structure. Mild mitral valve  regurgitation.  8. The tricuspid valve is normal in structure.  9. The aortic valve is abnormal. Moderately thickened leaflets. Aortic valve regurgitation is not visualized. Mild to moderate aortic valve sclerosis/calcification without any evidence of aortic stenosis. 10. The pulmonic valve was grossly normal. Pulmonic valve regurgitation is trivial. 11. Left atrial size was mildly dilated. 12. Right atrial size was moderately dilated. 13. The inferior vena cava is normal in size with <50% respiratory variability, suggesting right atrial pressure of 8 mmHg. 14. The tricuspid regurgitant velocity is 2.81 m/s, and with an assumed right atrial pressure of 8 mmHg, the estimated right ventricular systolic pressure is mildly elevated at 39.6 mmHg.   Echo 11/24/19: IMPRESSIONS    1. Limited exam with contrast to rule out apical thrombus  2. No apical thrombus seen  3. Left ventricular ejection fraction, by visual estimation, is 25 to 30%. The left ventricle has severely decreased function. There is no increased left ventricular wall thickness.  FINDINGS  Left Ventricle: Left ventricular ejection fraction, by visual estimation, is 25 to 30%. The left ventricle has severely decreased function. There is no increased left ventricular wall thickness.    Oswaldo Milian MD Electronically signed by Oswaldo Milian MD Signature Date/Time: 11/24/2019/9:47:04 PM  Patient Profile     86 y.o. female with nonischemic cardiomyopathy, history of sarcoidosis admitted with acute on chronic systolic congestive heart failure.   Assessment & Plan    1.  Acute on chronic systolic congestive heart failure EF 30%. I/O-380 yesterday but I suspect this is not complete. Weight listed at 118 lbs which I also question as was 134 lbs yesterday. Dry weight is close to 120 lbs based on prior records. At any rate she is much better. CXR shows resolution of right pleural effusion. Still has persistent left effusion  but on exam today this is better.  Renal function is stable but developing a contraction alkalosis with diuresis. Will give lasix 40 mg IV x 1 today. Stop metolazone. Stop milrinone today.  On low dose hydralazine, nitrates and spironolactone.  Increase activity. Phase 1 cardiac Rehab.   2. Nonischemic cardiomyopathy-no ACE inhibitor or ARB given history of angioedema.  On hydralazine 10 mg TID, spironolactone 12.5 mg daily and imdur 15 mg daily. Unable to titrate further due to soft BP.   3.  Chronic stage III kidney disease-follow renal function closely with diuresis.  4.  History of pulmonary sarcoidosis  For questions or updates, please contact [place] Please consult [email or web address] for contact info under        Signed, Brach Birdsall [place], MD  11/28/2019, 7:42 AM

## 2019-11-28 NOTE — Progress Notes (Signed)
CARDIAC REHAB PHASE I   PRE:  Rate/Rhythm: 106 ST lots of PVCs  BP:  Supine:   Sitting: 91/75  Standing:    SaO2: 100% 2L  MODE:  Ambulation: 300 ft   POST:  Rate/Rhythm: 140's with 3 to 4 beats VT  BP:  Supine:   Sitting: 108/66  Standing:    SaO2: 100% 2L  1325-1405 Pt assisted to [facility] and helped to get cleaned up. Then pt walked 300 ft on 2L with rolling walker and asst x 2. Pt with lots of ectopy and 3 to 4 beat runs during walk. Pt denied palpitations. To recliner after walk with call bell. Pt would benefit from OT/PT consult to assist with discharge planning and to assist with strengthening.   Graylon Good, RN BSN  11/28/2019 1:59 PM

## 2019-11-29 DIAGNOSIS — N1831 Chronic kidney disease, stage 3a: Secondary | ICD-10-CM

## 2019-11-29 DIAGNOSIS — I472 Ventricular tachycardia: Secondary | ICD-10-CM

## 2019-11-29 LAB — BASIC METABOLIC PANEL
Anion gap: 11 (ref 5–15)
BUN: 44 mg/dL — ABNORMAL HIGH (ref 8–23)
CO2: 36 mmol/L — ABNORMAL HIGH (ref 22–32)
Calcium: 9.4 mg/dL (ref 8.9–10.3)
Chloride: 84 mmol/L — ABNORMAL LOW (ref 98–111)
Creatinine, Ser: 1.45 mg/dL — ABNORMAL HIGH (ref 0.44–1.00)
GFR calc Af Amer: 39 mL/min — ABNORMAL LOW (ref >60)
GFR calc non Af Amer: 33 mL/min — ABNORMAL LOW (ref >60)
Glucose, Bld: 95 mg/dL (ref 70–99)
Potassium: 3.5 mmol/L (ref 3.5–5.1)
Sodium: 131 mmol/L — ABNORMAL LOW (ref 135–145)

## 2019-11-29 NOTE — Progress Notes (Signed)
CARDIAC REHAB PHASE I   PRE:  Rate/Rhythm: 107 ST with PAC/PVCs    BP: sitting 115/71    SaO2: 97 RA  MODE:  Ambulation: 290 ft   POST:  Rate/Rhythm: 144 ST with PAC/PVCs max, more controlled today    BP: sitting 75/47, 103/70    SaO2: 91 RA   Pt able to walk with gait belt and assist x2. She struggles to feel feet and to know where to place them. Unsteady entire walk. She agreed to [facility] for home for more independence. Notified RN. SAO2 good without O2. HR more controlled today. To recliner, husband present.  [place], ACSM 11/29/2019 2:49 PM

## 2019-11-29 NOTE — Evaluation (Signed)
Physical Therapy Evaluation Patient Details Name: Crystal [name] MRN: [id number] DOB: 1934/10/10 Today's Date: 11/29/2019   History of Present Illness  86 y.o. female with history of nonischemic cardiomyopathy, anxiety, hypertension, hypothyroidism, and sarcoidosis admitted with acute on chronic systolic congestive heart failure.  Clinical Impression  Pt presents to PT with deficits in functional mobility, gait, balance, endurance, and power. PT demonstrates instability during gait and dynamic balance activities, requiring steadying of unilateral UE support during session. Pt also with some increased work of breathing and tachycardia during mobility. Pt will benefit from continued acute PT POC to improve activity tolerance and reduce falls risk in an effort to restore independence. PT recommends the pt ambulate out of the room with assistance of staff at least 3 times per day for the remainder of her hospitalization.    Follow Up Recommendations Home health PT    Equipment Recommendations  None recommended by PT(pt owns cane already)    Recommendations for Other Services       Precautions / Restrictions Precautions Precautions: Fall Restrictions Weight Bearing Restrictions: No      Mobility  Bed Mobility Overal bed mobility: Modified Independent             General bed mobility comments: sup to sit with increased time  Transfers Overall transfer level: Needs assistance Equipment used: None Transfers: Sit to/from Stand Sit to Stand: Supervision            Ambulation/Gait Ambulation/Gait assistance: Min guard Gait Distance (Feet): 120 Feet Assistive device: 1 person hand held assist Gait Pattern/deviations: Step-to pattern;Drifts right/left Gait velocity: reduced Gait velocity interpretation: 1.31 - 2.62 ft/sec, indicative of limited community ambulator General Gait Details: pt with short step to gait intermittently drifting left/right, utilizing HHA to  stabilize  Stairs            Wheelchair Mobility    Modified Rankin (Stroke Patients Only)       Balance Overall balance assessment: Needs assistance Sitting-balance support: No upper extremity supported;Feet supported Sitting balance-Leahy Scale: Normal     Standing balance support: Single extremity supported Standing balance-Leahy Scale: Fair Standing balance comment: minG with HHA                             Pertinent Vitals/Pain Pain Assessment: No/denies pain    Home Living Family/patient expects to be discharged to:: Private residence Living Arrangements: Spouse/significant other Available Help at Discharge: Family;Available 24 hours/day Type of Home: House Home Access: Stairs to enter Entrance Stairs-Rails: Right Entrance Stairs-Number of Steps: 3 Home Layout: One level Home Equipment: Cane - single point      Prior Function Level of Independence: Independent         Comments: I without Ad, enjoys hiking and is part of a hiking club      Hand Dominance   Dominant Hand: Right    Extremity/Trunk Assessment   Upper Extremity Assessment Upper Extremity Assessment: Overall WFL for tasks assessed    Lower Extremity Assessment Lower Extremity Assessment: Overall WFL for tasks assessed    Cervical / Trunk Assessment Cervical / Trunk Assessment: Normal  Communication   Communication: No difficulties  Cognition Arousal/Alertness: Awake/alert Behavior During Therapy: WFL for tasks assessed/performed Overall Cognitive Status: Within Functional Limits for tasks assessed  General Comments General comments (skin integrity, edema, etc.): VSS, pt on RA ranging from 92-100% during activity. Pt tachy into 120s with activity but asymptomatic    Exercises     Assessment/Plan    PT Assessment Patient needs continued PT services  PT Problem List Decreased activity tolerance;Decreased  balance;Decreased mobility;Decreased knowledge of use of DME;Cardiopulmonary status limiting activity       PT Treatment Interventions DME instruction;Stair training;Gait training;Functional mobility training;Therapeutic activities;Therapeutic exercise;Balance training;Neuromuscular re-education;Patient/family education    PT Goals (Current goals can be found in the Care Plan section)  Acute Rehab PT Goals Patient Stated Goal: To return to independent mobility PT Goal Formulation: With patient Time For Goal Achievement: 12/13/19 Potential to Achieve Goals: Good Additional Goals Additional Goal #1: Pt will maintain dynamic standing balance within 10 inches of her base of support during functional tasks and ADLs, to ensure stability in the home setting, with supervision.    Frequency Min 3X/week   Barriers to discharge        Co-evaluation               AM-PAC PT "6 Clicks" Mobility  Outcome Measure Help needed turning from your back to your side while in a flat bed without using bedrails?: None Help needed moving from lying on your back to sitting on the side of a flat bed without using bedrails?: None Help needed moving to and from a bed to a chair (including a wheelchair)?: None Help needed standing up from a chair using your arms (e.g., wheelchair or bedside chair)?: None Help needed to walk in hospital room?: A Little Help needed climbing 3-5 steps with a railing? : A Lot 6 Click Score: 21    End of Session Equipment Utilized During Treatment: (none) Activity Tolerance: Patient tolerated treatment well Patient left: in chair;with call bell/phone within reach Nurse Communication: Mobility status PT Visit Diagnosis: Unsteadiness on feet (R26.81)    Time: 8421-0312 PT Time Calculation (min) (ACUTE ONLY): 28 min   Charges:   PT Evaluation $PT Eval Low Complexity: [place], PT, DPT Acute Rehabilitation Pager: [phone]   Zenaida Niece 11/29/2019, 9:12 AM

## 2019-11-29 NOTE — Progress Notes (Signed)
Progress Note  Patient Name: Crystal [name] Date of Encounter: 11/29/2019  Primary Cardiologist: [name], MD   Subjective   Feels much better today. Breathing is  improved. No cough or chest pain. States she did well with walking yesterday. No dizziness.  Inpatient Medications    Scheduled Meds: . aspirin EC  81 mg Oral Daily  . heparin  5,000 Units Subcutaneous Q8H  . hydrALAZINE  10 mg Oral Q8H  . isosorbide mononitrate  15 mg Oral Daily  . levothyroxine  50 mcg Oral Daily  . multivitamin with minerals  1 tablet Oral Daily  . sodium chloride flush  3 mL Intravenous Q12H  . spironolactone  12.5 mg Oral Daily   Continuous Infusions: . sodium chloride 250 mL (11/24/19 0941)   PRN Meds: sodium chloride, acetaminophen, albuterol, ALPRAZolam, magnesium hydroxide, ondansetron (ZOFRAN) IV, sodium chloride flush   Vital Signs    Vitals:   11/28/19 2118 11/28/19 2323 11/29/19 0349 11/29/19 0552  BP: (!) 99/56 116/70 106/66 103/63  Pulse:  98    Resp:  16 17   Temp:  97.8 F (36.6 C) 97.7 F (36.5 C)   TempSrc:  Oral Oral   SpO2:  100% 100%   Weight:   53 kg   Height:        Intake/Output Summary (Last 24 hours) at 11/29/2019 0739 Last data filed at 11/29/2019 0434 Gross per 24 hour  Intake 641.58 ml  Output 900 ml  Net -258.42 ml   Last 3 Weights 11/29/2019 11/28/2019 11/27/2019  Weight (lbs) 116 lb 13.5 oz 118 lb 6.2 oz 134 lb 11.2 oz  Weight (kg) 53 kg 53.7 kg 61.1 kg      Telemetry    Sinus tachy with PVCs, multiple runs of NSVT Personally Reviewed  ECG    none - Personally Reviewed  Physical Exam   GEN: Elderly BF, No acute distress.   Neck: No JVD Cardiac: IRRR, no murmurs, + gallop Respiratory: clear GI: Soft, nontender, non-distended  MS: tr edema; No deformity. Neuro:  Nonfocal  Psych: Normal affect   Labs    High Sensitivity Troponin:  No results for input(s): TROPONINIHS in the last 720 hours.    Chemistry Recent Labs  Lab  11/27/19 0212 11/28/19 0219 11/29/19 0208  NA 136 135 131*  K 3.7 3.7 3.5  CL 91* 87* 84*  CO2 38* 37* 36*  GLUCOSE 81 88 95  BUN 36* 40* 44*  CREATININE 1.37* 1.43* 1.45*  CALCIUM 9.4 9.6 9.4  GFRNONAA 36* 34* 33*  GFRAA 41* 39* 39*  ANIONGAP 7 11 11      Hematology No results for input(s): WBC, RBC, HGB, HCT, MCV, MCH, MCHC, RDW, PLT in the last 168 hours.  BNP No results for input(s): BNP, PROBNP in the last 168 hours.   DDimer No results for input(s): DDIMER in the last 168 hours.   Radiology    CXR PA and lateral  Result Date: 11/27/2019 CLINICAL DATA:  Congestive heart failure. Slightly productive cough EXAM: CHEST - 2 VIEW COMPARISON:  11/23/2019 FINDINGS: Normal heart size. Aortic atherosclerosis. Moderate left pleural effusion and small right pleural effusion identified. The right pleural effusion is decreased in volume from previous exam. IMPRESSION: Bilateral pleural effusions, moderate on the left and small on the right. Electronically Signed   By: Kerby Moors M.D.   On: 11/27/2019 08:57    Cardiac Studies   Echo 11/22/19: IMPRESSIONS    1. Left ventricular ejection  fraction, by visual estimation, is 30 to 35%. The left ventricle has moderate to severely decreased function. There is no left ventricular hypertrophy.  2. The left ventricle demonstrates global hypokinesis with inferior/inferoseptal akinesis. Apex with significant trabeculations, no clear thrombus seen but would consider limited TTE with contrast to rule out apical thrombus  3. Left ventricular diastolic parameters are consistent with Grade II diastolic dysfunction (pseudonormalization).  4. Elevated left atrial pressure.  5. Global right ventricle has moderately reduced systolic function.The right ventricular size is mildly enlarged.  6. Moderate pleural effusion in the left lateral region.  7. The mitral valve is normal in structure. Mild mitral valve regurgitation.  8. The tricuspid valve is  normal in structure.  9. The aortic valve is abnormal. Moderately thickened leaflets. Aortic valve regurgitation is not visualized. Mild to moderate aortic valve sclerosis/calcification without any evidence of aortic stenosis. 10. The pulmonic valve was grossly normal. Pulmonic valve regurgitation is trivial. 11. Left atrial size was mildly dilated. 12. Right atrial size was moderately dilated. 13. The inferior vena cava is normal in size with <50% respiratory variability, suggesting right atrial pressure of 8 mmHg. 14. The tricuspid regurgitant velocity is 2.81 m/s, and with an assumed right atrial pressure of 8 mmHg, the estimated right ventricular systolic pressure is mildly elevated at 39.6 mmHg.   Echo 11/24/19: IMPRESSIONS    1. Limited exam with contrast to rule out apical thrombus  2. No apical thrombus seen  3. Left ventricular ejection fraction, by visual estimation, is 25 to 30%. The left ventricle has severely decreased function. There is no increased left ventricular wall thickness.  FINDINGS  Left Ventricle: Left ventricular ejection fraction, by visual estimation, is 25 to 30%. The left ventricle has severely decreased function. There is no increased left ventricular wall thickness.    Oswaldo Milian MD Electronically signed by Oswaldo Milian MD Signature Date/Time: 11/24/2019/9:47:04 PM  Patient Profile     86 y.o. female with nonischemic cardiomyopathy, history of sarcoidosis admitted with acute on chronic systolic congestive heart failure.   Assessment & Plan    1.  Acute on chronic systolic congestive heart failure EF 30%. Weight down to 116 lbs. Looks dry now. Will hold further lasix today.  Renal function is stable but developing a contraction alkalosis with diuresis. She is hyponatremic today. Will add fluid restriction.    On low dose hydralazine, nitrates and spironolactone. Would like to add beta blocker but BP too soft for now.   Increase activity.  Phase 1 cardiac Rehab. PT/OT consulted. May be moving toward DC tomorrow.   2. Nonischemic cardiomyopathy-no ACE inhibitor or ARB given history of angioedema.  On hydralazine 10 mg TID, spironolactone 12.5 mg daily and imdur 15 mg daily. Unable to titrate further due to soft BP.   3.  Chronic stage III kidney disease-follow renal function closely with diuresis.  4.  History of pulmonary sarcoidosis  5. NSVT. Multiple runs. Patient asymptomatic. Consider adding beta blocker when BP allows.   For questions or updates, please contact [place] Please consult [email or web address] for contact info under        Signed, [place] Noyce [place], MD  11/29/2019, 7:39 AM

## 2019-11-29 NOTE — Evaluation (Signed)
Occupational Therapy Evaluation Patient Details Name: Crystal [name] MRN: [id number] DOB: 1933/11/25 Today's Date: 11/29/2019    History of Present Illness 86 y.o. female with history of nonischemic cardiomyopathy, anxiety, hypertension, hypothyroidism, and sarcoidosis admitted with acute on chronic systolic congestive heart failure.   Clinical Impression   Pt was functioning independently prior to admission. Presents with mild standing balance impairment, decreased memory and problem solving. No family available to determine prior cognition. Pt writes things down as a compensatory strategy. Pt is overall functioning at a supervision to min assist level. Recommending home with her supportive husband and HHOT.    Follow Up Recommendations  Home health OT;Supervision/Assistance - 24 hour    Equipment Recommendations  3 in 1 bedside commode    Recommendations for Other Services       Precautions / Restrictions Precautions Precautions: Fall Restrictions Weight Bearing Restrictions: No      Mobility Bed Mobility              General bed mobility comments: pt received in chair  Transfers Overall transfer level: Needs assistance Equipment used: None Transfers: Sit to/from Stand;Stand Pivot Transfers Sit to Stand: Supervision Stand pivot transfers: Supervision       General transfer comment: supervision for lines/safety    Balance Overall balance assessment: Needs assistance Sitting-balance support: No upper extremity supported;Feet supported Sitting balance-Leahy Scale: Normal     Standing balance support: Single extremity supported Standing balance-Leahy Scale: Fair Standing balance comment: supervision at sink                           ADL either performed or assessed with clinical judgement   ADL Overall ADL's : Needs assistance/impaired Eating/Feeding: Independent   Grooming: Oral care;Standing;Supervision/safety   Upper Body Bathing: Set  up;Sitting   Lower Body Bathing: Supervison/ safety;Sit to/from stand   Upper Body Dressing : Set up;Sitting   Lower Body Dressing: Supervision/safety;Sitting/lateral leans Lower Body Dressing Details (indicate cue type and reason): socks Toilet Transfer: Supervision/safety;Stand-pivot;BSC   Toileting- Clothing Manipulation and Hygiene: Minimal assistance;Sit to/from stand         General ADL Comments: pt needing verbal cues to take down underwear with toileting, pt stating she weighs herself daily and is typically 113 lbs, but "all of a sudden" jumped to 150. Asked pt if this happened overnight and she reported "no," but did not problem solve to call MD for guidance as swelling increased.     Vision Patient Visual Report: No change from baseline       Perception     Praxis      Pertinent Vitals/Pain Pain Assessment: No/denies pain     Hand Dominance Right   Extremity/Trunk Assessment Upper Extremity Assessment Upper Extremity Assessment: Overall WFL for tasks assessed   Lower Extremity Assessment Lower Extremity Assessment: Defer to PT evaluation   Cervical / Trunk Assessment Cervical / Trunk Assessment: Normal   Communication Communication Communication: No difficulties   Cognition Arousal/Alertness: Awake/alert Behavior During Therapy: WFL for tasks assessed/performed Overall Cognitive Status: Impaired/Different from baseline Area of Impairment: Memory;Problem solving                     Memory: Decreased short-term memory       Problem Solving: Slow processing;Decreased initiation;Requires verbal cues     General Comments  VSS, pt on RA ranging from 92-100% during activity. Pt tachy into 120s with activity but asymptomatic    Exercises  Shoulder Instructions      Home Living Family/patient expects to be discharged to:: Private residence Living Arrangements: Spouse/significant other Available Help at Discharge: Family;Available 24  hours/day Type of Home: House Home Access: Stairs to enter [employer] of Steps: 3 Entrance Stairs-Rails: Right Home Layout: One level     Bathroom Shower/Tub: Teacher, early years/pre: Standard     Home Equipment: Cane - single point   Additional Comments: pt was on home 02 prior to admission      Prior Functioning/Environment Level of Independence: Independent        Comments: I without Ad, enjoys hiking and is part of a hiking club         OT Problem List: Decreased activity tolerance;Impaired balance (sitting and/or standing);Decreased cognition      OT Treatment/Interventions: Self-care/ADL training;DME and/or AE instruction;Patient/family education;Balance training    OT Goals(Current goals can be found in the care plan section) Acute Rehab OT Goals Patient Stated Goal: To return to independent mobility OT Goal Formulation: With patient Time For Goal Achievement: 12/13/19 Potential to Achieve Goals: Good ADL Goals Pt Will Perform Lower Body Dressing: sit to/from stand;Independently Pt Will Transfer to Toilet: ambulating;bedside commode;Independently(over toilet) Pt Will Perform Toileting - Clothing Manipulation and hygiene: sit to/from stand;Independently  OT Frequency: Min 2X/week   Barriers to D/C:            Co-evaluation              AM-PAC OT "6 Clicks" Daily Activity     Outcome Measure Help from another person eating meals?: None Help from another person taking care of personal grooming?: A Little Help from another person toileting, which includes using toliet, bedpan, or urinal?: A Little Help from another person bathing (including washing, rinsing, drying)?: A Little Help from another person to put on and taking off regular upper body clothing?: None Help from another person to put on and taking off regular lower body clothing?: A Little 6 Click Score: 20   End of Session Equipment Utilized During Treatment: Gait  belt Nurse Communication: Other (comment)(NT aware pt is on [facility])  Activity Tolerance: Patient tolerated treatment well Patient left: Other (comment)(on BSC)  OT Visit Diagnosis: Other symptoms and signs involving cognitive function;Unsteadiness on feet (R26.81)                Time: 4854-6270 OT Time Calculation (min): 16 min Charges:  OT General Charges $OT Visit: 1 Visit OT Evaluation $OT Eval Moderate Complexity: 1 Mod  Nestor Lewandowsky, OTR/L Acute Rehabilitation Services Pager: [phone] Office: [phone]  Malka So 11/29/2019, 10:12 AM

## 2019-11-29 NOTE — TOC Transition Note (Signed)
Transition of Care [facility]) - CM/SW Discharge Note   Patient Details  Name: RESHONDA KOERBER MRN: [id number] Date of Birth: 09/17/1934  Transition of Care [facility]) CM/SW Contact:  Zenon Mayo, RN Phone Number: 11/29/2019, 3:08 PM   Clinical Narrative:      NCM offered choice for HHPT, she chose [facility], referral made to [place] with [facility]. Soc will begin 24 to 48 hrs post dc.  She also needs a rolling walker,she was ok with [employer] for the walker, referral made to [facility] he will bring to patient's room.   Final next level of care: [place] Barriers to Discharge: No Barriers Identified   Patient Goals and CMS Choice Patient states their goals for this hospitalization and ongoing recovery are:: get better CMS Medicare.gov Compare Post Acute Care list provided to:: Patient Choice offered to / list presented to : Patient  Discharge Placement                       Discharge Plan and Services                DME Arranged: Walker rolling DME Agency: [employer] Date DME Agency Contacted: 11/29/19 Time DME Agency Contacted: [phone] Representative spoke with at DME Agency: [place]: PT [place]: [facility] [place] Date [place]: 11/29/19 Time [place]: 1508 Representative spoke with at [place]: [place] ([place]) Interventions     Readmission Risk Interventions No flowsheet data found.

## 2019-11-30 DIAGNOSIS — I472 Ventricular tachycardia: Secondary | ICD-10-CM

## 2019-11-30 DIAGNOSIS — I4729 Other ventricular tachycardia: Secondary | ICD-10-CM

## 2019-11-30 LAB — BASIC METABOLIC PANEL
Anion gap: 9 (ref 5–15)
BUN: 38 mg/dL — ABNORMAL HIGH (ref 8–23)
CO2: 35 mmol/L — ABNORMAL HIGH (ref 22–32)
Calcium: 9.5 mg/dL (ref 8.9–10.3)
Chloride: 90 mmol/L — ABNORMAL LOW (ref 98–111)
Creatinine, Ser: 1.48 mg/dL — ABNORMAL HIGH (ref 0.44–1.00)
GFR calc Af Amer: 38 mL/min — ABNORMAL LOW (ref >60)
GFR calc non Af Amer: 32 mL/min — ABNORMAL LOW (ref >60)
Glucose, Bld: 104 mg/dL — ABNORMAL HIGH (ref 70–99)
Potassium: 3.5 mmol/L (ref 3.5–5.1)
Sodium: 134 mmol/L — ABNORMAL LOW (ref 135–145)

## 2019-11-30 LAB — MAGNESIUM: Magnesium: 1.8 mg/dL (ref 1.7–2.4)

## 2019-11-30 MED ORDER — FUROSEMIDE 40 MG PO TABS
40.0000 mg | ORAL_TABLET | Freq: Every day | ORAL | Status: DC
  Administered 2019-11-30: 40 mg via ORAL
  Filled 2019-11-30: qty 1

## 2019-11-30 MED ORDER — ISOSORBIDE MONONITRATE ER 30 MG PO TB24: 15.0000 mg | ORAL_TABLET | Freq: Every day | ORAL | 3 refills | Status: DC

## 2019-11-30 MED ORDER — FUROSEMIDE 40 MG PO TABS: 40.0000 mg | ORAL_TABLET | Freq: Every day | ORAL | 3 refills | Status: DC

## 2019-11-30 MED ORDER — HYDRALAZINE HCL 10 MG PO TABS: 10.0000 mg | ORAL_TABLET | Freq: Three times a day (TID) | ORAL | 3 refills | Status: DC

## 2019-11-30 MED ORDER — SPIRONOLACTONE 25 MG PO TABS: 12.5000 mg | ORAL_TABLET | Freq: Every day | ORAL | 3 refills | Status: DC

## 2019-11-30 NOTE — Care Management Important Message (Signed)
Important Message  Patient Details  Name: Crystal [name] MRN: [id number] Date of Birth: 05-Mar-1934   Medicare Important Message Given:  Yes     Memory Argue 11/30/2019, 11:31 AM

## 2019-11-30 NOTE — Discharge Instructions (Signed)
WEIGH DAILY, every am, wearing the same amount of clothing, bring weight records to office visits. Record weights, contact [name], MD for weight gain of 3 lbs in a day or 5 lbs in a week Limit sodium to 500 mg per meal, total 2000 mg per day Limit all liquids to 1.5-2 liters/quarts per day    Heart Failure, Diagnosis  Heart failure means that your heart is not able to pump blood in the right way. This makes it hard for your body to work well. Heart failure is usually a long-term (chronic) condition. You must take good care of yourself and follow your treatment plan from your doctor. What are the causes? This condition may be caused by:  High blood pressure.  Build up of cholesterol and fat in the arteries.  Heart attack. This injures the heart muscle.  Heart valves that do not open and close properly.  Damage of the heart muscle. This is also called cardiomyopathy.  Lung disease.  Abnormal heart rhythms. What increases the risk? The risk of heart failure goes up as a person ages. This condition is also more likely to develop in people who:  Are overweight.  Are female.  Smoke or chew tobacco.  Abuse alcohol or illegal drugs.  Have taken medicines that can damage the heart.  Have diabetes.  Have abnormal heart rhythms.  Have thyroid problems.  Have low blood counts (anemia). What are the signs or symptoms? Symptoms of this condition include:  Shortness of breath.  Coughing.  Swelling of the feet, ankles, legs, or belly.  Losing weight for no reason.  Trouble breathing.  Waking from sleep because of the need to sit up and get more air.  Rapid heartbeat.  Being very tired.  Feeling dizzy, or feeling like you may pass out (faint).  Having no desire to eat.  Feeling like you may vomit (nauseous).  Peeing (urinating) more at night.  Feeling confused. How is this treated?     This condition may be treated with:  Medicines. These can be  given to treat blood pressure and to make the heart muscles stronger.  Changes in your daily life. These may include eating a healthy diet, staying at a healthy body weight, quitting tobacco and illegal drug use, or doing exercises.  Surgery. Surgery can be done to open blocked valves, or to put devices in the heart, such as pacemakers.  A donor heart (heart transplant). You will receive a healthy heart from a donor. Follow these instructions at home:  Treat other conditions as told by your doctor. These may include high blood pressure, diabetes, thyroid disease, or abnormal heart rhythms.  Learn as much as you can about heart failure.  Get support as you need it.  Keep all follow-up visits as told by your doctor. This is important. Summary  Heart failure means that your heart is not able to pump blood in the right way.  This condition is caused by high blood pressure, heart attack, or damage of the heart muscle.  Symptoms of this condition include shortness of breath and swelling of the feet, ankles, legs, or belly. You may also feel very tired or feel like you may vomit.  You may be treated with medicines, surgery, or changes in your daily life.  Treat other health conditions as told by your doctor. This information is not intended to replace advice given to you by your health care provider. Make sure you discuss any questions you have with your  health care provider. Document Revised: 01/20/2019 Document Reviewed: 01/20/2019 Elsevier Patient Education  [place].   Low-Sodium Eating Plan Sodium, which is an element that makes up salt, helps you maintain a healthy balance of fluids in your body. Too much sodium can increase your blood pressure and cause fluid and waste to be held in your body. Your health care provider or dietitian may recommend following this plan if you have high blood pressure (hypertension), kidney disease, liver disease, or heart failure. Eating less  sodium can help lower your blood pressure, reduce swelling, and protect your heart, liver, and kidneys. What are tips for following this plan? General guidelines  Most people on this plan should limit their sodium intake to 1,500-2,000 mg (milligrams) of sodium each day. Reading food labels   The Nutrition Facts label lists the amount of sodium in one serving of the food. If you eat more than one serving, you must multiply the listed amount of sodium by the number of servings.  Choose foods with less than 140 mg of sodium per serving.  Avoid foods with 300 mg of sodium or more per serving. Shopping  Look for lower-sodium products, often labeled as "low-sodium" or "no salt added."  Always check the sodium content even if foods are labeled as "unsalted" or "no salt added".  Buy fresh foods. ? Avoid canned foods and premade or frozen meals. ? Avoid canned, cured, or processed meats  Buy breads that have less than 80 mg of sodium per slice. Cooking  Eat more home-cooked food and less restaurant, buffet, and fast food.  Avoid adding salt when cooking. Use salt-free seasonings or herbs instead of table salt or sea salt. Check with your health care provider or pharmacist before using salt substitutes.  Cook with plant-based oils, such as canola, sunflower, or olive oil. Meal planning  When eating at a restaurant, ask that your food be prepared with less salt or no salt, if possible.  Avoid foods that contain MSG (monosodium glutamate). MSG is sometimes added to [place] food, bouillon, and some canned foods. What foods are recommended? The items listed may not be a complete list. Talk with your dietitian about what dietary choices are best for you. Grains Low-sodium cereals, including oats, puffed wheat and rice, and shredded wheat. Low-sodium crackers. Unsalted rice. Unsalted pasta. Low-sodium bread. Whole-grain breads and whole-grain pasta. Vegetables Fresh or frozen vegetables.  "No salt added" canned vegetables. "No salt added" tomato sauce and paste. Low-sodium or reduced-sodium tomato and vegetable juice. Fruits Fresh, frozen, or canned fruit. Fruit juice. Meats and other protein foods Fresh or frozen (no salt added) meat, poultry, seafood, and fish. Low-sodium canned tuna and salmon. Unsalted nuts. Dried peas, beans, and lentils without added salt. Unsalted canned beans. Eggs. Unsalted nut butters. Dairy Milk. Soy milk. Cheese that is naturally low in sodium, such as ricotta cheese, fresh mozzarella, or Swiss cheese Low-sodium or reduced-sodium cheese. Cream cheese. Yogurt. Fats and oils Unsalted butter. Unsalted margarine with no trans fat. Vegetable oils such as canola or olive oils. Seasonings and other foods Fresh and dried herbs and spices. Salt-free seasonings. Low-sodium mustard and ketchup. Sodium-free salad dressing. Sodium-free light mayonnaise. Fresh or refrigerated horseradish. Lemon juice. Vinegar. Homemade, reduced-sodium, or low-sodium soups. Unsalted popcorn and pretzels. Low-salt or salt-free chips. What foods are not recommended? The items listed may not be a complete list. Talk with your dietitian about what dietary choices are best for you. Grains Instant hot cereals. Bread stuffing, pancake, and  biscuit mixes. Croutons. Seasoned rice or pasta mixes. Noodle soup cups. Boxed or frozen macaroni and cheese. Regular salted crackers. Self-rising flour. Vegetables Sauerkraut, pickled vegetables, and relishes. Olives. [place] fries. Onion rings. Regular canned vegetables (not low-sodium or reduced-sodium). Regular canned tomato sauce and paste (not low-sodium or reduced-sodium). Regular tomato and vegetable juice (not low-sodium or reduced-sodium). Frozen vegetables in sauces. Meats and other protein foods Meat or fish that is salted, canned, smoked, spiced, or pickled. Bacon, ham, sausage, hotdogs, corned beef, chipped beef, packaged lunch meats, salt  pork, jerky, pickled herring, anchovies, regular canned tuna, sardines, salted nuts. Dairy Processed cheese and cheese spreads. Cheese curds. Blue cheese. Feta cheese. String cheese. Regular cottage cheese. Buttermilk. Canned milk. Fats and oils Salted butter. Regular margarine. Ghee. Bacon fat. Seasonings and other foods Onion salt, garlic salt, seasoned salt, table salt, and sea salt. Canned and packaged gravies. Worcestershire sauce. Tartar sauce. Barbecue sauce. Teriyaki sauce. Soy sauce, including reduced-sodium. Steak sauce. Fish sauce. Oyster sauce. Cocktail sauce. Horseradish that you find on the shelf. Regular ketchup and mustard. Meat flavorings and tenderizers. Bouillon cubes. Hot sauce and Tabasco sauce. Premade or packaged marinades. Premade or packaged taco seasonings. Relishes. Regular salad dressings. Salsa. Potato and tortilla chips. Corn chips and puffs. Salted popcorn and pretzels. Canned or dried soups. Pizza. Frozen entrees and pot pies. Summary  Eating less sodium can help lower your blood pressure, reduce swelling, and protect your heart, liver, and kidneys.  Most people on this plan should limit their sodium intake to 1,500-2,000 mg (milligrams) of sodium each day.  Canned, boxed, and frozen foods are high in sodium. Restaurant foods, fast foods, and pizza are also very high in sodium. You also get sodium by adding salt to food.  Try to cook at home, eat more fresh fruits and vegetables, and eat less fast food, canned, processed, or prepared foods. This information is not intended to replace advice given to you by your health care provider. Make sure you discuss any questions you have with your health care provider. Document Revised: 10/15/2017 Document Reviewed: 10/26/2016 Elsevier Patient Education  2020 [employer].

## 2019-11-30 NOTE — Progress Notes (Signed)
CARDIAC REHAB PHASE I   PRE:  Rate/Rhythm: 98 ST with PVCs    BP: sitting 100/60    SaO2: 98 RA  MODE:  Ambulation: 340 ft   POST:  Rate/Rhythm: 122 ST with PVCs    BP: sitting 91/65     SaO2: 96 RA  Pt able to stand and transfer to [facility]. Then ambulated hall with RW. [place] and stronger today. She still needs verbal cues on what to do. Return to recliner. Discussed HF booklet and pt voiced understanding. She sts her children help buy groceries. [id number]   [place], ACSM 11/30/2019 10:33 AM

## 2019-11-30 NOTE — Plan of Care (Signed)

## 2019-11-30 NOTE — Progress Notes (Signed)
Progress Note  Patient Name: Crystal [name] Date of Encounter: 11/30/2019  Primary Cardiologist: [name], MD   Subjective   Feels much better today. Breathing is improved. No cough or chest pain.  No dizziness. Walked a couple of times yesterday.   Inpatient Medications    Scheduled Meds: . aspirin EC  81 mg Oral Daily  . furosemide  40 mg Oral Daily  . heparin  5,000 Units Subcutaneous Q8H  . hydrALAZINE  10 mg Oral Q8H  . isosorbide mononitrate  15 mg Oral Daily  . levothyroxine  50 mcg Oral Daily  . multivitamin with minerals  1 tablet Oral Daily  . sodium chloride flush  3 mL Intravenous Q12H  . spironolactone  12.5 mg Oral Daily   Continuous Infusions: . sodium chloride 250 mL (11/24/19 0941)   PRN Meds: sodium chloride, acetaminophen, albuterol, ALPRAZolam, magnesium hydroxide, ondansetron (ZOFRAN) IV, sodium chloride flush   Vital Signs    Vitals:   11/29/19 2349 11/30/19 0237 11/30/19 0315 11/30/19 0621  BP: (!) 104/53   107/66  Pulse:   95   Resp: (!) 22  (!) 23   Temp:      TempSrc: Oral     SpO2: 95%  96%   Weight:  52.5 kg    Height:        Intake/Output Summary (Last 24 hours) at 11/30/2019 0727 Last data filed at 11/30/2019 5681 Gross per 24 hour  Intake 840 ml  Output 1550 ml  Net -710 ml   Last 3 Weights 11/30/2019 11/29/2019 11/28/2019  Weight (lbs) 115 lb 11.9 oz 116 lb 13.5 oz 118 lb 6.2 oz  Weight (kg) 52.5 kg 53 kg 53.7 kg      Telemetry    Sinus tachy with PVCs, couplets and triplets. Overall less NSVT Personally Reviewed  ECG    none - Personally Reviewed  Physical Exam   GEN: Elderly BF, No acute distress.   Neck: No JVD Cardiac: IRRR, no murmurs, + gallop Respiratory: clear GI: Soft, nontender, non-distended  MS: no edema; No deformity. Neuro:  Nonfocal  Psych: Normal affect   Labs    High Sensitivity Troponin:  No results for input(s): TROPONINIHS in the last 720 hours.    Chemistry Recent Labs  Lab  11/28/19 0219 11/29/19 0208 11/30/19 0248  NA 135 131* 134*  K 3.7 3.5 3.5  CL 87* 84* 90*  CO2 37* 36* 35*  GLUCOSE 88 95 104*  BUN 40* 44* 38*  CREATININE 1.43* 1.45* 1.48*  CALCIUM 9.6 9.4 9.5  GFRNONAA 34* 33* 32*  GFRAA 39* 39* 38*  ANIONGAP 11 11 9      Hematology No results for input(s): WBC, RBC, HGB, HCT, MCV, MCH, MCHC, RDW, PLT in the last 168 hours.  BNP No results for input(s): BNP, PROBNP in the last 168 hours.   DDimer No results for input(s): DDIMER in the last 168 hours.   Radiology    No results found.  Cardiac Studies   Echo 11/22/19: IMPRESSIONS    1. Left ventricular ejection fraction, by visual estimation, is 30 to 35%. The left ventricle has moderate to severely decreased function. There is no left ventricular hypertrophy.  2. The left ventricle demonstrates global hypokinesis with inferior/inferoseptal akinesis. Apex with significant trabeculations, no clear thrombus seen but would consider limited TTE with contrast to rule out apical thrombus  3. Left ventricular diastolic parameters are consistent with Grade II diastolic dysfunction (pseudonormalization).  4. Elevated left  atrial pressure.  5. Global right ventricle has moderately reduced systolic function.The right ventricular size is mildly enlarged.  6. Moderate pleural effusion in the left lateral region.  7. The mitral valve is normal in structure. Mild mitral valve regurgitation.  8. The tricuspid valve is normal in structure.  9. The aortic valve is abnormal. Moderately thickened leaflets. Aortic valve regurgitation is not visualized. Mild to moderate aortic valve sclerosis/calcification without any evidence of aortic stenosis. 10. The pulmonic valve was grossly normal. Pulmonic valve regurgitation is trivial. 11. Left atrial size was mildly dilated. 12. Right atrial size was moderately dilated. 13. The inferior vena cava is normal in size with <50% respiratory variability, suggesting  right atrial pressure of 8 mmHg. 14. The tricuspid regurgitant velocity is 2.81 m/s, and with an assumed right atrial pressure of 8 mmHg, the estimated right ventricular systolic pressure is mildly elevated at 39.6 mmHg.   Echo 11/24/19: IMPRESSIONS    1. Limited exam with contrast to rule out apical thrombus  2. No apical thrombus seen  3. Left ventricular ejection fraction, by visual estimation, is 25 to 30%. The left ventricle has severely decreased function. There is no increased left ventricular wall thickness.  FINDINGS  Left Ventricle: Left ventricular ejection fraction, by visual estimation, is 25 to 30%. The left ventricle has severely decreased function. There is no increased left ventricular wall thickness.    Oswaldo Milian MD Electronically signed by Oswaldo Milian MD Signature Date/Time: 11/24/2019/9:47:04 PM  Patient Profile     86 y.o. female with nonischemic cardiomyopathy, history of sarcoidosis admitted with acute on chronic systolic congestive heart failure.   Assessment & Plan    1.  Acute on chronic systolic congestive heart failure EF 30%. Weight down to 115 lbs from 147. She is now euvolemic. Will resume lasix at 40 mg once a day.  Continue fluid and sodium restriction.    On low dose hydralazine, nitrates and spironolactone. Would like to add beta blocker but BP too soft for now. May consider starting as an outpatient.   PT/OT consulted. Will arrange for outpatient OT/PT with rolling walker. Does not require home oxygen at this point. Plan DC home today.  2. Nonischemic cardiomyopathy-no ACE inhibitor or ARB given history of angioedema.  On hydralazine 10 mg TID, spironolactone 12.5 mg daily and imdur 15 mg daily. Resume lasix 40 mg daily. Unable to titrate further due to soft BP.   3.  Chronic stage III kidney disease-follow renal function closely with diuresis. Creatinine stable at 1.48 today.   4.  History of pulmonary sarcoidosis  5. NSVT.   Patient asymptomatic. Consider adding beta blocker when BP allows.    For questions or updates, please contact [place] Please consult [email or web address] for contact info under        Signed, Kelani Robart [place], MD  11/30/2019, 7:27 AM

## 2019-11-30 NOTE — Discharge Summary (Signed)
Discharge Summary    Patient ID: Crystal [name] MRN: [id number]; DOB: 1934-09-04  Admit date: 11/21/2019 Discharge date: 11/30/2019  Primary Care Provider: Mosie Lukes, MD  Primary Cardiologist: [name], MD  Primary Electrophysiologist:  None   Discharge Diagnoses    Active Problems:   Acute on chronic combined systolic (congestive) and diastolic (congestive) heart failure (HCC)   CKD stage G3a/A1, GFR 45-59 and albumin creatinine ratio <30 mg/g   CHF (congestive heart failure) (HCC)   NSVT (nonsustained ventricular tachycardia) (HCC)   Allergies Allergies  Allergen Reactions   Lisinopril Swelling    Facial and lip swelling   Hydralazine Hcl Other (See Comments)    hypotension   Iohexol Itching         Diagnostic Studies/Procedures    ECHO COMPLETE 01/06, ECHO LIMITED 01/08. RESULTS ARE BELOW _____________   History of Present Illness     Crystal [name] is an 86 y.o. female with sarcoid, NICM, S-D-CHF w/ EF 35-40% echo 11/2018, HTN, hypothyroid, remote non-Hodgkins lymphoma, angioedema with ACE inhibitors, who was admitted 11/21/2019 with CHF exacerbation.   Hospital Course     Consultants: None   She was markedly volume overloaded on admission with a BNP of >3500.  She was diuresed with IV Lasix, initially 80 mg IV twice daily.  This was increased to 120 mg IV twice daily from 1/07-1/10.  During that time, she had metolazone 2.5 mg added to stimulate diuresis.  The Lasix was tapered down to 40 mg IV daily on 1/12.  She will be discharged on 40 mg p.o. daily.  With diuresis, her weight decreased from 147 pounds down to 115 pounds or 52.5 kg, losing 32 pounds during her hospital stay.  She will be encouraged to stick to a low-sodium diet and do daily weights at home.  Her renal function tolerated the diuresis very well.  BUN/creatinine were 35/1.48 on admission and 38/1.48 at discharge.  Because of her decreased renal function, and angioedema on ACE  inhibitors, she is not on ACE, ARB.  Her beta-blocker was initially held because of the acute CHF.  She is currently tolerating low-dose hydralazine and nitrates as well as spironolactone.  Beta-blocker can be considered as an outpatient.  She was felt to have deconditioning. She was seen by PT/OT as well as cardiac rehab.  Outpatient PT OT was recommended as well as a rolling walker.  She currently does not need home oxygen.  Her potassium required supplementation during her hospital stay.  However, she was not on potassium prior to admission and spironolactone was added.  She will not be discharged on potassium.  BMET at follow-up.  An echocardiogram was done this admission which showed her EF to be 30-35%.  There was concern for apical thrombus, so a repeat echo with contrast was performed.  No thrombus was seen.  On the repeat echo, her EF was 25-30%.  On 11/30/2019, she was seen by Dr. Martinique and all data were reviewed.  He adjusted her medications.  Her blood pressure is too soft for any up titration, but she is on low-doses of hydralazine, nitrate and spironolactone.  No further inpatient work-up is indicated and she is considered stable for discharge, to follow-up as an outpatient.  Did the patient have an acute coronary syndrome (MI, NSTEMI, STEMI, etc) this admission?:  No  Did the patient have a percutaneous coronary intervention (stent / angioplasty)?:  No.   _____________  Discharge Vitals Blood pressure 90/60, pulse 97, temperature 98.5 F (36.9 C), temperature source Oral, resp. rate 19, height 5\' 3"  (1.6 m), weight 52.5 kg, SpO2 97 %.  Filed Weights   11/28/19 0412 11/29/19 0349 11/30/19 0237  Weight: 53.7 kg 53 kg 52.5 kg    Labs & Radiologic Studies    CBC Lab Results  Component Value Date   WBC 4.3 11/21/2019   HGB 13.0 11/21/2019   HCT 41.2 11/21/2019   MCV 96.3 11/21/2019   PLT 91 (L) 16/11/930    Basic Metabolic Panel Recent Labs     11/29/19 0208 11/29/19 2331 11/30/19 0248  NA 131*  --  134*  K 3.5  --  3.5  CL 84*  --  90*  CO2 36*  --  35*  GLUCOSE 95  --  104*  BUN 44*  --  38*  CREATININE 1.45*  --  1.48*  CALCIUM 9.4  --  9.5  MG  --  1.8  --     BNP    Component Value Date/Time   BNP 3,536.1 (H) 11/21/2019 1321   BNP 325.0 (H) 10/21/2009 0808    Thyroid Function Tests Lab Results  Component Value Date   TSH 4.484 11/21/2019    _____________  CXR PA and lateral  Result Date: 11/27/2019 CLINICAL DATA:  Congestive heart failure. Slightly productive cough EXAM: CHEST - 2 VIEW COMPARISON:  11/23/2019 FINDINGS: Normal heart size. Aortic atherosclerosis. Moderate left pleural effusion and small right pleural effusion identified. The right pleural effusion is decreased in volume from previous exam. IMPRESSION: Bilateral pleural effusions, moderate on the left and small on the right. Electronically Signed   By: Kerby Moors M.D.   On: 11/27/2019 08:57   DG Chest 2 View  Result Date: 11/22/2019 CLINICAL DATA:  Short of breath EXAM: CHEST - 2 VIEW COMPARISON:  11/21/2019 FINDINGS: Small bilateral pleural effusions and bibasilar atelectasis unchanged. No edema. Perihilar atelectasis bilaterally. IMPRESSION: Stable bibasilar pleural effusions and airspace disease. Negative for edema. Electronically Signed   By: Franchot Gallo M.D.   On: 11/22/2019 08:56   DG Chest 2 View  Result Date: 11/21/2019 CLINICAL DATA:  Short of breath and swelling in extremities.  CHF. EXAM: CHEST - 2 VIEW COMPARISON:  07/17/2019 FINDINGS: Bilateral small pleural effusions with bibasilar atelectasis. Vascular congestion with mild airspace disease. Atherosclerotic aortic arch. IMPRESSION: Congestive heart failure with mild edema and bilateral effusions. Bibasilar atelectasis. Electronically Signed   By: Franchot Gallo M.D.   On: 11/21/2019 13:15   DG CHEST PORT 1 VIEW  Result Date: 11/23/2019 CLINICAL DATA:  Shortness of breath  and tachypnea EXAM: PORTABLE CHEST 1 VIEW COMPARISON:  Yesterday FINDINGS: Bilateral pleural effusion, likely moderate. Linear opacity with volume loss in the right mid lung following the minor fissure. There is a band of scarring also seen in the left perihilar lung. Largely obscured heart size. There is mediastinal widening from chronic adenopathy. Patient has history of treated lymphoma. IMPRESSION: 1. Moderate pleural effusions and lung scarring that is stable. 2. Chronic mediastinal adenopathy in this patient with history of treated lymphoma. Electronically Signed   By: Monte Fantasia M.D.   On: 11/23/2019 11:28   ECHOCARDIOGRAM COMPLETE  Result Date: 11/22/2019   ECHOCARDIOGRAM REPORT   Patient Name:   Crystal [name] Date of Exam: 11/22/2019 Medical Rec #:  [id number]  Height:       63.0 in Accession #:    [id number]     Weight:       156.7 lb Date of Birth:  1934-11-05      BSA:          1.74 m Patient Age:    43 years       BP:           91/57 mmHg Patient Gender: F              HR:           68 bpm. Exam Location:  Inpatient Procedure: 2D Echo, 3D Echo, Color Doppler and Cardiac Doppler Indications:    R06.9 DOE  History:        Patient has prior history of Echocardiogram examinations, most                 recent 11/30/2018. CHF; Risk Factors:Hypertension and                 Dyslipidemia.  Sonographer:    Raquel Sarna Senior RDCS Referring Phys: Ali Chukson  1. Left ventricular ejection fraction, by visual estimation, is 30 to 35%. The left ventricle has moderate to severely decreased function. There is no left ventricular hypertrophy.  2. The left ventricle demonstrates global hypokinesis with inferior/inferoseptal akinesis. Apex with significant trabeculations, no clear thrombus seen but would consider limited TTE with contrast to rule out apical thrombus  3. Left ventricular diastolic parameters are consistent with Grade II diastolic dysfunction (pseudonormalization).  4. Elevated left  atrial pressure.  5. Global right ventricle has moderately reduced systolic function.The right ventricular size is mildly enlarged.  6. Moderate pleural effusion in the left lateral region.  7. The mitral valve is normal in structure. Mild mitral valve regurgitation.  8. The tricuspid valve is normal in structure.  9. The aortic valve is abnormal. Moderately thickened leaflets. Aortic valve regurgitation is not visualized. Mild to moderate aortic valve sclerosis/calcification without any evidence of aortic stenosis. 10. The pulmonic valve was grossly normal. Pulmonic valve regurgitation is trivial. 11. Left atrial size was mildly dilated. 12. Right atrial size was moderately dilated. 13. The inferior vena cava is normal in size with <50% respiratory variability, suggesting right atrial pressure of 8 mmHg. 14. The tricuspid regurgitant velocity is 2.81 m/s, and with an assumed right atrial pressure of 8 mmHg, the estimated right ventricular systolic pressure is mildly elevated at 39.6 mmHg. FINDINGS  Left Ventricle: Left ventricular ejection fraction, by visual estimation, is 30 to 35%. The left ventricle has moderate to severely decreased function. The left ventricle demonstrates global hypokinesis. There is no left ventricular hypertrophy. Left ventricular diastolic parameters are consistent with Grade II diastolic dysfunction (pseudonormalization). Elevated left atrial pressure. Right Ventricle: The right ventricular size is mildly enlarged. No increase in right ventricular wall thickness. Global RV systolic function is has moderately reduced systolic function. The tricuspid regurgitant velocity is 2.81 m/s, and with an assumed right atrial pressure of 8 mmHg, the estimated right ventricular systolic pressure is mildly elevated at 39.6 mmHg. Left Atrium: Left atrial size was mildly dilated. Right Atrium: Right atrial size was moderately dilated Pericardium: Trivial pericardial effusion is present. There is a  moderate pleural effusion in the left lateral region. Mitral Valve: The mitral valve is normal in structure. Mild mitral valve regurgitation. Tricuspid Valve: The tricuspid valve is normal in structure. Tricuspid valve regurgitation is mild. Aortic Valve: The aortic  valve is abnormal. . There is moderate thickening of the aortic valve. Aortic valve regurgitation is not visualized. Mild to moderate aortic valve sclerosis/calcification is present, without any evidence of aortic stenosis. There  is moderate thickening of the aortic valve. Pulmonic Valve: The pulmonic valve was grossly normal. Pulmonic valve regurgitation is trivial. Pulmonic regurgitation is trivial. Aorta: The aortic root and ascending aorta are structurally normal, with no evidence of dilitation. Venous: The inferior vena cava is normal in size with less than 50% respiratory variability, suggesting right atrial pressure of 8 mmHg. IAS/Shunts: The interatrial septum was not well visualized.  LEFT VENTRICLE PLAX 2D LVIDd:         5.10 cm       Diastology LVIDs:         4.20 cm       LV e' lateral:   6.87 cm/s LV PW:         0.90 cm       LV E/e' lateral: 16.2 LV IVS:        0.60 cm       LV e' medial:    3.37 cm/s LVOT diam:     1.90 cm       LV E/e' medial:  32.9 LV SV:         45 ml LV SV Index:   25.16 LVOT Area:     2.84 cm                               3D Volume EF: LV Volumes (MOD)             3D EF:        32 % LV area d, A2C:    34.10 cm LV EDV:       172 ml LV area d, A4C:    33.00 cm LV ESV:       117 ml LV area s, A2C:    28.20 cm LV SV:        55 ml LV area s, A4C:    26.40 cm LV major d, A2C:   8.58 cm LV major d, A4C:   7.93 cm LV major s, A2C:   8.00 cm LV major s, A4C:   7.64 cm LV vol d, MOD A2C: 112.0 ml LV vol d, MOD A4C: 114.0 ml LV vol s, MOD A2C: 84.2 ml LV vol s, MOD A4C: 76.4 ml LV SV MOD A2C:     27.8 ml LV SV MOD A4C:     114.0 ml LV SV MOD BP:      35.6 ml RIGHT VENTRICLE RV S prime:     5.36 cm/s TAPSE (M-mode): 1.2 cm  LEFT ATRIUM             Index       RIGHT ATRIUM           Index LA diam:        2.60 cm 1.49 cm/m  RA Area:     19.60 cm LA Vol (A2C):   57.8 ml 33.15 ml/m RA Volume:   64.10 ml  36.77 ml/m LA Vol (A4C):   59.5 ml 34.13 ml/m LA Biplane Vol: 60.1 ml 34.47 ml/m  AORTIC VALVE LVOT Vmax:   69.00 cm/s LVOT Vmean:  44.200 cm/s LVOT VTI:    0.129 m  AORTA Ao Root diam: 2.60 cm Ao Asc diam:  2.70 cm MITRAL  VALVE                         TRICUSPID VALVE MV Area (PHT): 4.21 cm              TR Peak grad:   31.6 mmHg MV PHT:        52.20 msec            TR Vmax:        281.00 cm/s MV Decel Time: 180 msec MV E velocity: 111.00 cm/s 103 cm/s  SHUNTS MV A velocity: 88.80 cm/s  70.3 cm/s Systemic VTI:  0.13 m MV E/A ratio:  1.25        1.5       Systemic Diam: 1.90 cm  Oswaldo Milian MD Electronically signed by Oswaldo Milian MD Signature Date/Time: 11/22/2019/11:05:42 AM    Final    ECHOCARDIOGRAM LIMITED  Result Date: 11/24/2019   ECHOCARDIOGRAM LIMITED REPORT   Patient Name:   Crystal [name] Date of Exam: 11/24/2019 Medical Rec #:  [id number]      Height:       63.0 in Accession #:    [id number]     Weight:       148.4 lb Date of Birth:  [birth year]      BSA:          1.70 m Patient Age:    54 years       BP:           117/90 mmHg Patient Gender: F              HR:           99 bpm. Exam Location:  Inpatient  Procedure: Limited Echo and Intracardiac Opacification Agent Indications:    Cardiomyopathy; Limited study with Definity to rule out apical                 Thrombus.  History:        Patient has prior history of Echocardiogram examinations, most                 recent 11/21/2018.  Sonographer:    Mikki Santee RDCS (AE) Referring Phys: [place]  1. Limited exam with contrast to rule out apical thrombus  2. No apical thrombus seen  3. Left ventricular ejection fraction, by visual estimation, is 25 to 30%. The left ventricle has severely decreased function. There is no increased left  ventricular wall thickness. FINDINGS  Left Ventricle: Left ventricular ejection fraction, by visual estimation, is 25 to 30%. The left ventricle has severely decreased function. There is no increased left ventricular wall thickness.  Oswaldo Milian MD Electronically signed by Oswaldo Milian MD Signature Date/Time: 11/24/2019/9:47:04 PM   Final    Disposition   Pt is being discharged home today in good condition.  Follow-up Plans & [place] Follow up.   Specialty: Home Health Services Why: HHPT Contact information: [place] [address] [facility] Follow up.   Why: rolling walker, Contact information: [place] [address] [phone]        Almyra Deforest, PA Follow up on 12/11/2019.   Specialties: Cardiology, Radiology Why: Please arrive at 8:45 am for a 9:00 am appt. Contact information: [place]  [address] [address] [phone]          Discharge Instructions    Diet - low sodium heart healthy   Complete by: As directed    Increase activity slowly   Complete by: As directed       Discharge Medications   Allergies as of 11/30/2019      Reactions   Lisinopril Swelling   Facial and lip swelling   Hydralazine Hcl Other (See Comments)   hypotension   Iohexol Itching         Medication List    STOP taking these medications   carvedilol 3.125 MG tablet Commonly known as: COREG     TAKE these medications   acetaminophen 325 MG tablet Commonly known as: TYLENOL Take 2 tablets (650 mg total) by mouth every 6 (six) hours as needed for fever, headache, mild pain or moderate pain.   albuterol 108 (90 Base) MCG/ACT inhaler Commonly known as: VENTOLIN HFA INHALE 1 TO 2 PUFFS BY MOUTH EVERY 4 HOURS AS NEEDED FOR WHEEZING FOR SHORTNESS OF BREATH What changed: See the new instructions.     ALPRAZolam 0.25 MG tablet Commonly known as: XANAX Take 1 tablet by mouth twice daily as needed for anxiety What changed:   reasons to take this  additional instructions   benzonatate 100 MG capsule Commonly known as: TESSALON TAKE 1 TO 2 CAPSULES BY MOUTH THREE TIMES DAILY AS NEEDED FOR COUGH What changed: See the new instructions.   CENTRUM PO Take 1 tablet by mouth daily.   Fish Oil 1000 MG Caps Take 1,000 mg by mouth daily.   furosemide 40 MG tablet Commonly known as: LASIX Take 1 tablet (40 mg total) by mouth daily. OR AS DIRECTED What changed:   how much to take  additional instructions   hydrALAZINE 10 MG tablet Commonly known as: APRESOLINE Take 1 tablet (10 mg total) by mouth every 8 (eight) hours.   isosorbide mononitrate 30 MG 24 hr tablet Commonly known as: IMDUR Take 0.5 tablets (15 mg total) by mouth daily. Start taking on: December 01, 2019   levothyroxine 50 MCG tablet Commonly known as: SYNTHROID Take 1 tablet (50 mcg total) by mouth daily.   magnesium hydroxide 400 MG/5ML suspension Commonly known as: MILK OF MAGNESIA Take 15 mLs by mouth daily as needed for mild constipation.   PROBIOTIC DAILY PO Take 1 capsule by mouth daily.   spironolactone 25 MG tablet Commonly known as: ALDACTONE Take 0.5 tablets (12.5 mg total) by mouth daily. Start taking on: December 01, 2019            Durable Medical Equipment  (From admission, onward)         Start     Ordered   11/30/19 0733  Heart failure home health orders  (Heart failure home health orders / Face to face)  Once    Comments: Heart Failure Follow-up Care:  Verify follow-up appointments per Patient Discharge Instructions. Confirm transportation arranged. Reconcile home medications with discharge medication list. Remove discontinued medications from use. Assist patient/caregiver to manage medications using pill box. Reinforce low sodium food selection Assessments: Vital signs and  oxygen saturation at each visit. Assess home environment for safety concerns, caregiver support and availability of low-sodium foods. Consult Education officer, museum, PT/OT, Dietitian, and CNA based on assessments. Perform comprehensive cardiopulmonary assessment. Notify MD for any change in condition or weight gain of 3 pounds in one day or 5 pounds in one week with symptoms. Daily Weights  and Symptom Monitoring: Ensure patient has access to scales. Teach patient/caregiver to weigh daily before breakfast and after voiding using same scale and record.    Teach patient/caregiver to track weight and symptoms and when to notify Provider. Activity: Develop individualized activity plan with patient/caregiver.   Question Answer Comment  Heart Failure Follow-up Care Or per Doctor (see comments)   Obtain the following labs Basic Metabolic Panel   Lab frequency Weekly   Fax lab results to Other see comments   Diet Low Sodium Heart Healthy   Fluid restrictions: 1500 mL Fluid      11/30/19 0735   11/29/19 1449  For home use only DME Walker rolling  Once    Question Answer Comment  Walker: With [place] Wheels   Patient needs a walker to treat with the following condition Weakness      11/29/19 1448             Outstanding Labs/Studies   None  Duration of Discharge Encounter   Greater than 30 minutes including physician time.  Signed, Rosaria Ferries, PA-C 11/30/2019, 9:57 AM

## 2019-11-30 NOTE — Progress Notes (Signed)
Pt discharged home with husband, taken to front of hospital via w/c. Pt husband had walker and home O2 with him  If Pt needed on way home and when arrived at home

## 2019-11-30 NOTE — Plan of Care (Signed)
  Problem: Elimination: Goal: Will not experience complications related to bowel motility 11/30/2019 1319 by Shanon Ace, RN Outcome: Adequate for Discharge 11/30/2019 0748 by Shanon Ace, RN Outcome: Progressing Goal: Will not experience complications related to urinary retention 11/30/2019 1319 by Shanon Ace, RN Outcome: Adequate for Discharge 11/30/2019 0748 by Shanon Ace, RN Outcome: Progressing   Problem: Education: Goal: Ability to demonstrate management of disease process will improve 11/30/2019 1321 by Shanon Ace, RN Outcome: Adequate for Discharge 11/30/2019 1319 by Shanon Ace, RN Outcome: Adequate for Discharge 11/30/2019 0748 by Shanon Ace, RN Outcome: Progressing Goal: Ability to verbalize understanding of medication therapies will improve 11/30/2019 1319 by Shanon Ace, RN Outcome: Adequate for Discharge 11/30/2019 0748 by Shanon Ace, RN Outcome: Progressing Goal: Individualized Educational Video(s) 11/30/2019 1319 by Shanon Ace, RN Outcome: Adequate for Discharge 11/30/2019 0748 by Shanon Ace, RN Outcome: Progressing   Problem: Activity: Goal: Capacity to carry out activities will improve 11/30/2019 1319 by Shanon Ace, RN Outcome: Adequate for Discharge 11/30/2019 0748 by Shanon Ace, RN Outcome: Progressing   Problem: Cardiac: Goal: Ability to achieve and maintain adequate cardiopulmonary perfusion will improve 11/30/2019 1319 by Shanon Ace, RN Outcome: Adequate for Discharge 11/30/2019 0748 by Shanon Ace, RN Outcome: Progressing   Problem: Education: Goal: Knowledge of General Education information will improve Description: Including pain rating scale, medication(s)/side effects and non-pharmacologic comfort measures 11/30/2019 1319 by Shanon Ace, RN Outcome: Adequate for Discharge 11/30/2019 0748 by Shanon Ace, RN Outcome: Progressing   Problem: Health Behavior/Discharge Planning: Goal: Ability to manage  health-related needs will improve 11/30/2019 1319 by Shanon Ace, RN Outcome: Adequate for Discharge 11/30/2019 0748 by Shanon Ace, RN Outcome: Progressing   Problem: Clinical Measurements: Goal: Ability to maintain clinical measurements within normal limits will improve 11/30/2019 1319 by Shanon Ace, RN Outcome: Adequate for Discharge 11/30/2019 0748 by Shanon Ace, RN Outcome: Progressing Goal: Will remain free from infection 11/30/2019 1319 by Shanon Ace, RN Outcome: Adequate for Discharge 11/30/2019 0748 by Shanon Ace, RN Outcome: Progressing Goal: Diagnostic test results will improve 11/30/2019 1319 by Shanon Ace, RN Outcome: Adequate for Discharge 11/30/2019 0748 by Shanon Ace, RN Outcome: Progressing Goal: Respiratory complications will improve 11/30/2019 1319 by Shanon Ace, RN Outcome: Adequate for Discharge 11/30/2019 0748 by Shanon Ace, RN Outcome: Progressing Goal: Cardiovascular complication will be avoided 11/30/2019 1319 by Shanon Ace, RN Outcome: Adequate for Discharge 11/30/2019 0748 by Shanon Ace, RN Outcome: Progressing

## 2019-11-30 NOTE — Plan of Care (Signed)
  Problem: Education: Goal: Ability to demonstrate management of disease process will improve 11/30/2019 1319 by Shanon Ace, RN Outcome: Adequate for Discharge 11/30/2019 0748 by Shanon Ace, RN Outcome: Progressing Goal: Ability to verbalize understanding of medication therapies will improve 11/30/2019 1319 by Shanon Ace, RN Outcome: Adequate for Discharge 11/30/2019 0748 by Shanon Ace, RN Outcome: Progressing Goal: Individualized Educational Video(s) 11/30/2019 1319 by Shanon Ace, RN Outcome: Adequate for Discharge 11/30/2019 0748 by Shanon Ace, RN Outcome: Progressing   Problem: Activity: Goal: Capacity to carry out activities will improve 11/30/2019 1319 by Shanon Ace, RN Outcome: Adequate for Discharge 11/30/2019 0748 by Shanon Ace, RN Outcome: Progressing   Problem: Cardiac: Goal: Ability to achieve and maintain adequate cardiopulmonary perfusion will improve 11/30/2019 1319 by Shanon Ace, RN Outcome: Adequate for Discharge 11/30/2019 0748 by Shanon Ace, RN Outcome: Progressing   Problem: Education: Goal: Knowledge of General Education information will improve Description: Including pain rating scale, medication(s)/side effects and non-pharmacologic comfort measures 11/30/2019 1319 by Shanon Ace, RN Outcome: Adequate for Discharge 11/30/2019 0748 by Shanon Ace, RN Outcome: Progressing   Problem: Health Behavior/Discharge Planning: Goal: Ability to manage health-related needs will improve 11/30/2019 1319 by Shanon Ace, RN Outcome: Adequate for Discharge 11/30/2019 0748 by Shanon Ace, RN Outcome: Progressing   Problem: Clinical Measurements: Goal: Ability to maintain clinical measurements within normal limits will improve 11/30/2019 1319 by Shanon Ace, RN Outcome: Adequate for Discharge 11/30/2019 0748 by Shanon Ace, RN Outcome: Progressing Goal: Will remain free from infection 11/30/2019 1319 by Shanon Ace, RN Outcome:  Adequate for Discharge 11/30/2019 0748 by Shanon Ace, RN Outcome: Progressing Goal: Diagnostic test results will improve 11/30/2019 1319 by Shanon Ace, RN Outcome: Adequate for Discharge 11/30/2019 0748 by Shanon Ace, RN Outcome: Progressing Goal: Respiratory complications will improve 11/30/2019 1319 by Shanon Ace, RN Outcome: Adequate for Discharge 11/30/2019 0748 by Shanon Ace, RN Outcome: Progressing Goal: Cardiovascular complication will be avoided 11/30/2019 1319 by Shanon Ace, RN Outcome: Adequate for Discharge 11/30/2019 0748 by Shanon Ace, RN Outcome: Progressing   Problem: Nutrition: Goal: Adequate nutrition will be maintained 11/30/2019 1319 by Shanon Ace, RN Outcome: Adequate for Discharge 11/30/2019 0748 by Shanon Ace, RN Outcome: Progressing   Problem: Activity: Goal: Risk for activity intolerance will decrease 11/30/2019 1319 by Shanon Ace, RN Outcome: Adequate for Discharge 11/30/2019 0748 by Shanon Ace, RN Outcome: Progressing   Problem: Coping: Goal: Level of anxiety will decrease 11/30/2019 1319 by Shanon Ace, RN Outcome: Adequate for Discharge 11/30/2019 0748 by Shanon Ace, RN Outcome: Progressing   Problem: Elimination: Goal: Will not experience complications related to bowel motility 11/30/2019 1319 by Shanon Ace, RN Outcome: Adequate for Discharge 11/30/2019 0748 by Shanon Ace, RN Outcome: Progressing Goal: Will not experience complications related to urinary retention 11/30/2019 1319 by Shanon Ace, RN Outcome: Adequate for Discharge 11/30/2019 0748 by Shanon Ace, RN Outcome: Progressing   Problem: Pain Managment: Goal: General experience of comfort will improve 11/30/2019 1319 by Shanon Ace, RN Outcome: Adequate for Discharge 11/30/2019 0748 by Shanon Ace, RN Outcome: Progressing   Problem: Safety: Goal: Ability to remain free from injury will improve 11/30/2019 1319 by Shanon Ace,  RN Outcome: Adequate for Discharge 11/30/2019 0748 by Shanon Ace, RN Outcome: Progressing   Problem: Skin Integrity: Goal: Risk for impaired skin integrity will decrease 11/30/2019 1319 by Shanon Ace, RN Outcome: Adequate for Discharge 11/30/2019 0748 by Shanon Ace, RN Outcome: Progressing

## 2019-12-03 DIAGNOSIS — C8590 Non-Hodgkin lymphoma, unspecified, unspecified site: Secondary | ICD-10-CM | POA: Diagnosis not present

## 2019-12-03 DIAGNOSIS — I130 Hypertensive heart and chronic kidney disease with heart failure and stage 1 through stage 4 chronic kidney disease, or unspecified chronic kidney disease: Secondary | ICD-10-CM | POA: Diagnosis not present

## 2019-12-03 DIAGNOSIS — Z7951 Long term (current) use of inhaled steroids: Secondary | ICD-10-CM | POA: Diagnosis not present

## 2019-12-03 DIAGNOSIS — N1831 Chronic kidney disease, stage 3a: Secondary | ICD-10-CM | POA: Diagnosis not present

## 2019-12-03 DIAGNOSIS — I5022 Chronic systolic (congestive) heart failure: Secondary | ICD-10-CM | POA: Diagnosis not present

## 2019-12-03 DIAGNOSIS — E039 Hypothyroidism, unspecified: Secondary | ICD-10-CM | POA: Diagnosis not present

## 2019-12-03 DIAGNOSIS — I5042 Chronic combined systolic (congestive) and diastolic (congestive) heart failure: Secondary | ICD-10-CM | POA: Diagnosis not present

## 2019-12-03 DIAGNOSIS — Z9181 History of falling: Secondary | ICD-10-CM | POA: Diagnosis not present

## 2019-12-07 ENCOUNTER — Telehealth: Payer: Self-pay

## 2019-12-07 DIAGNOSIS — N1831 Chronic kidney disease, stage 3a: Secondary | ICD-10-CM | POA: Diagnosis not present

## 2019-12-07 DIAGNOSIS — I130 Hypertensive heart and chronic kidney disease with heart failure and stage 1 through stage 4 chronic kidney disease, or unspecified chronic kidney disease: Secondary | ICD-10-CM | POA: Diagnosis not present

## 2019-12-07 DIAGNOSIS — C8590 Non-Hodgkin lymphoma, unspecified, unspecified site: Secondary | ICD-10-CM | POA: Diagnosis not present

## 2019-12-07 DIAGNOSIS — I5042 Chronic combined systolic (congestive) and diastolic (congestive) heart failure: Secondary | ICD-10-CM | POA: Diagnosis not present

## 2019-12-07 DIAGNOSIS — Z7951 Long term (current) use of inhaled steroids: Secondary | ICD-10-CM | POA: Diagnosis not present

## 2019-12-07 DIAGNOSIS — E039 Hypothyroidism, unspecified: Secondary | ICD-10-CM | POA: Diagnosis not present

## 2019-12-07 NOTE — Telephone Encounter (Signed)
Notify [facility] she could benefit from a weekly BMET for the next 4 weeks but it will not need to be indefinite. She had dehydration and renal insufficiency while in the hospital

## 2019-12-07 NOTE — Telephone Encounter (Signed)
Patient nurse by the name of  Pam form [facility] is calling in wanting to know if the patient needs to have Labs done weekly for a BMT please call Pam back as soon as possible at [phone] thanks.

## 2019-12-07 NOTE — Telephone Encounter (Signed)
Please advise 

## 2019-12-08 ENCOUNTER — Ambulatory Visit: Payer: Medicare Other | Admitting: General Practice

## 2019-12-08 NOTE — Telephone Encounter (Signed)
Left detailed message for Pam about the message below

## 2019-12-11 ENCOUNTER — Other Ambulatory Visit: Payer: Self-pay

## 2019-12-11 ENCOUNTER — Other Ambulatory Visit: Payer: Self-pay | Admitting: Family Medicine

## 2019-12-11 ENCOUNTER — Ambulatory Visit (INDEPENDENT_AMBULATORY_CARE_PROVIDER_SITE_OTHER): Payer: Medicare Other | Admitting: Family Medicine

## 2019-12-11 ENCOUNTER — Encounter: Payer: Self-pay | Admitting: Family Medicine

## 2019-12-11 VITALS — BP 120/68 | HR 101 | Temp 97.1°F | Ht 63.0 in | Wt 114.4 lb

## 2019-12-11 DIAGNOSIS — D869 Sarcoidosis, unspecified: Secondary | ICD-10-CM

## 2019-12-11 DIAGNOSIS — E039 Hypothyroidism, unspecified: Secondary | ICD-10-CM | POA: Diagnosis not present

## 2019-12-11 DIAGNOSIS — I428 Other cardiomyopathies: Secondary | ICD-10-CM

## 2019-12-11 DIAGNOSIS — I10 Essential (primary) hypertension: Secondary | ICD-10-CM

## 2019-12-11 DIAGNOSIS — R000 Tachycardia, unspecified: Secondary | ICD-10-CM

## 2019-12-11 DIAGNOSIS — I5023 Acute on chronic systolic (congestive) heart failure: Secondary | ICD-10-CM

## 2019-12-11 LAB — BASIC METABOLIC PANEL
BUN/Creatinine Ratio: 27 (ref 12–28)
BUN: 36 mg/dL — ABNORMAL HIGH (ref 8–27)
CO2: 22 mmol/L (ref 20–29)
Calcium: 9.9 mg/dL (ref 8.7–10.3)
Chloride: 98 mmol/L (ref 96–106)
Creatinine, Ser: 1.34 mg/dL — ABNORMAL HIGH (ref 0.57–1.00)
GFR calc Af Amer: 42 mL/min/{1.73_m2} — ABNORMAL LOW (ref >59)
GFR calc non Af Amer: 37 mL/min/{1.73_m2} — ABNORMAL LOW (ref >59)
Glucose: 95 mg/dL (ref 65–99)
Potassium: 4.2 mmol/L (ref 3.5–5.2)
Sodium: 135 mmol/L (ref 134–144)

## 2019-12-11 MED ORDER — CARVEDILOL 3.125 MG PO TABS: 3.1250 mg | ORAL_TABLET | Freq: Two times a day (BID) | ORAL | 0 refills | Status: DC

## 2019-12-11 NOTE — Patient Instructions (Signed)
Medication Instructions:   CARVEDILOL 3.125 MG 2 TIMES A DAY  *If you need a refill on your cardiac medications before your next appointment, please call your pharmacy*  Lab Work: Your physician recommends that you return for lab work TODAY:  BMET If you have labs (blood work) drawn today and your tests are completely normal, you will receive your results only by: Marland Kitchen MyChart Message (if you have MyChart) OR . A paper copy in the mail If you have any lab test that is abnormal or we need to change your treatment, we will call you to review the results.  Testing/Procedures: NONE ordered at this time of appointment   Follow-Up: At [facility], you and your health needs are our priority.  As part of our continuing mission to provide you with exceptional heart care, we have created designated Provider Care Teams.  These Care Teams include your primary Cardiologist (physician) and Advanced Practice Providers (APPs -  Physician Assistants and Nurse Practitioners) who all work together to provide you with the care you need, when you need it.  Your next appointment:   1-2 month(s)  The format for your next appointment:   In Person  Provider:   [name], MD or Almyra Deforest, PA-C  Other Instructions

## 2019-12-11 NOTE — Progress Notes (Addendum)
Cardiology Office Note  Date: 12/11/2019   ID: Crystal [name], DOB 06/20/1934, MRN [id number]  PCP:  Mosie Lukes, MD  Cardiologist:  [name], MD Electrophysiologist:  None   Chief Complaint  Patient presents with  . Follow-up    Acute on chronic systolic and diastolic heart failure.    History of Present Illness: Crystal [name] is a 86 y.o. female last seen November 21, 2019 as a follow-up for dyspnea and weight gain.  She has a history of nonischemic cardiomyopathy, hypertension, hypothyroidism and sarcoidosis.  She had cardiac catheterization 2010 showing an EF of 15 to 20% with normal coronary arteries.  Previous history of syncopal episode likely vagal related.  Has declined ICD and LifeVest in the past.  Only wants medical therapy.  Recent echocardiogram January 2020 showed 35 to 40% EF grade 2 diastolic dysfunction, severe left atrial and mild enlargement and moderate RV dysfunction.  She was discovered to have a new lung mass in early 2020 pathology consistent with sarcoidosis. She was started on prednisone. Could not tolerate ACE inhibitors, hydralazine, and nitrates  Seen in early December for acute dyspnea and hypoxemia. She was advised hospital admission but declined.  Her Lasix was increased to 80 mg for 3 days.  She had a subsequent virtual visit 12/18.  She remained volume overloaded.  She was again instructed to increase Lasix to 80 mg daily for 3 days before going back to previous dose.  At visit November 21, 2019 she continued to complain of increasing dyspnea gaining 40 pounds since September 2020.  She had 3+ pitting edema in both lower extremities.  She was in severe acute on chronic systolic heart failure needing IV Lasix.  She was eventually sent to the emergency room due to lack of bed availability in the hospital for IV Lasix.  Her BNP at the hospital was greater than 3500.  She was diuresed with IV Lasix initially 80 mg IV twice daily.  This was increased to  120 mg IV twice daily from January 7 through January 10.  Metolazone was added 2.5 just stimulate diuresis.  Lasix was tapered down to 40 mg IV daily on January until 12th.  She was discharged on 40 mg p.o. Lasix daily.  Her weight decreased from 147 pounds down to 115 pounds.  She lost 32 pounds during hospital stay.  Echocardiogram was done during the recent hospital admission which showed her EF to be 30 to 35%.  A repeat echo was done for suspected apical thrombus.  No thrombus was seen.  Repeat echo showed an EF of 25 to 30%  She was discharged on low doses of hydralazine, nitrate and spironolactone.  Today she presents without any particular complaints other than some lower extremity edema.  Weight today is 114.  She denies any significant dyspnea on exertion.  She wears compression stockings and elevates her feet at night.  She is now able to lay flat to sleep with feet elevated.  Before hospital admission she was having trouble breathing even sitting upright.   EKG today shows atrial fibrillation with a rate of 101 with premature atrial complexes and premature ventricular complexes or fusion complexes, possible left atrial enlargement, left axis deviation, right bundle branch block, left ventricular hypertrophy with QRS widening, anterior infarct, age undetermined.  She states she is walking watching her salt intake.  She is asking about the amount of fluid intake she could consume in the day.  States she is weighing  herself daily.  She states today her scales were a little lower on the weight than the scales here.  States her lower extremity edema has significantly improved.  Past Medical History:  Diagnosis Date  . Anemia 08/21/2017  . Anxiety   . Cardiomyopathy    non ischemic NL cos on cath 08/2009, EF to 15-20%, Her follow up  2D echo  in 10/2009 showed impoved EFo 35-40%  . CHF (congestive heart failure) ([place])   . Cough   . Depression   . Dyspnea   . Edema of leg   . Full dentures    . Hypercalcemia 04/20/2017  . Hypertension   . Hypothyroid   . Lung mass   . Medicare annual wellness visit, subsequent 12/14/2014   Follows with Dr Marin Olp Follows with Dr Deatra Ina, gastroenterology, last colonoscopy in 2012, repeat in 2017 Last Pap 2011, always normal, no need for repeat Last MGM in 2011, no concerns, declines for now Follows with Dr Stanford Breed of cardiology   . Non Hodgkin's lymphoma ([place]) 11/17/1999   -Dr. Marin Olp  . Pleural effusion   . Vitamin D deficiency 10/17/2015  . Wears glasses     Past Surgical History:  Procedure Laterality Date  . DILATION AND CURETTAGE OF UTERUS    . MEDIASTINOSCOPY N/A 05/24/2019   Procedure: MEDIASTINOSCOPY;  Surgeon: Melrose Nakayama, MD;  Location: [facility] OR;  Service: Thoracic;  Laterality: N/A;  . MULTIPLE TOOTH EXTRACTIONS    . NASAL POLYP EXCISION  1962  . TONSILLECTOMY  1952  . VIDEO BRONCHOSCOPY WITH ENDOBRONCHIAL NAVIGATION Right 06/29/2018   Procedure: VIDEO BRONCHOSCOPY WITH ENDOBRONCHIAL NAVIGATION;  Surgeon: Collene Gobble, MD;  Location: [facility];  Service: Thoracic;  Laterality: Right;  Marland Kitchen VIDEO BRONCHOSCOPY WITH ENDOBRONCHIAL NAVIGATION N/A 05/24/2019   Procedure: VIDEO BRONCHOSCOPY WITH ENDOBRONCHIAL NAVIGATION;  Surgeon: Melrose Nakayama, MD;  Location: [place];  Service: Thoracic;  Laterality: N/A;  . VIDEO BRONCHOSCOPY WITH ENDOBRONCHIAL ULTRASOUND Right 06/29/2018   Procedure: VIDEO BRONCHOSCOPY WITH ENDOBRONCHIAL ULTRASOUND;  Surgeon: Collene Gobble, MD;  Location: [place] [facility];  Service: Thoracic;  Laterality: Right;  Marland Kitchen VIDEO BRONCHOSCOPY WITH ENDOBRONCHIAL ULTRASOUND N/A 05/24/2019   Procedure: VIDEO BRONCHOSCOPY WITH ENDOBRONCHIAL ULTRASOUND;  Surgeon: Melrose Nakayama, MD;  Location: [facility] OR;  Service: Thoracic;  Laterality: N/A;    Current Outpatient Medications  Medication Sig Dispense Refill  . albuterol (VENTOLIN HFA) 108 (90 Base) MCG/ACT inhaler INHALE 1 TO 2 PUFFS BY MOUTH EVERY 4 HOURS AS NEEDED FOR WHEEZING FOR  SHORTNESS OF BREATH (Patient taking differently: Inhale 1 puff into the lungs every 6 (six) hours as needed for shortness of breath. ) 18 g 0  . furosemide (LASIX) 40 MG tablet Take 1 tablet (40 mg total) by mouth daily. OR AS DIRECTED 45 tablet 3  . hydrALAZINE (APRESOLINE) 10 MG tablet Take 1 tablet (10 mg total) by mouth every 8 (eight) hours. 90 tablet 3  . isosorbide mononitrate (IMDUR) 30 MG 24 hr tablet Take 0.5 tablets (15 mg total) by mouth daily. 20 tablet 3  . levothyroxine (SYNTHROID) 50 MCG tablet Take 1 tablet (50 mcg total) by mouth daily. 90 tablet 3  . Multiple Vitamins-Minerals (CENTRUM PO) Take 1 tablet by mouth daily.     . Omega-3 Fatty Acids (FISH OIL) 1000 MG CAPS Take 1,000 mg by mouth daily.    . Probiotic Product (PROBIOTIC DAILY PO) Take 1 capsule by mouth daily.     Marland Kitchen spironolactone (ALDACTONE) 25 MG tablet Take 0.5  tablets (12.5 mg total) by mouth daily. 20 tablet 3  . acetaminophen (TYLENOL) 325 MG tablet Take 2 tablets (650 mg total) by mouth every 6 (six) hours as needed for fever, headache, mild pain or moderate pain. (Patient not taking: Reported on 12/11/2019)    . ALPRAZolam (XANAX) 0.25 MG tablet Take 1 tablet by mouth twice daily as needed for anxiety (Patient not taking: No sig reported) 30 tablet 0  . benzonatate (TESSALON) 100 MG capsule TAKE 1 TO 2 CAPSULES BY MOUTH THREE TIMES DAILY AS NEEDED FOR COUGH (Patient not taking: No sig reported) 60 capsule 0  . carvedilol (COREG) 3.125 MG tablet Take 1 tablet (3.125 mg total) by mouth 2 (two) times daily. 180 tablet 0   No current facility-administered medications for this visit.   Allergies:  Lisinopril, Hydralazine hcl, and Iohexol   Social History: The patient  reports that she has never smoked. She has never used smokeless tobacco. She reports that she does not drink alcohol or use drugs.   Family History: The patient's family history includes Arthritis in her sister, sister, and sister; Cancer in her  brother; Colon cancer (age of onset: 76) in her brother; Colon polyps in her father; Diabetes in her brother, mother, sister, sister, sister, and sister; Hypertension in her daughter, daughter, and mother; Kidney disease in her brother; Liver disease in her daughter; Obesity in her son; Other in an other family member; Pneumonia in her father; Sarcoidosis in her daughter and sister; Vision loss in her mother.   ROS:  Please see the history of present illness. Otherwise, complete review of systems is positive for none.  All other systems are reviewed and negative.   Physical Exam: VS:  BP 120/68   Pulse (!) 101   Temp (!) 97.1 F (36.2 C)   Ht 5\' 3"  (1.6 m)   Wt 114 lb 6.4 oz (51.9 kg)   SpO2 98%   BMI 20.27 kg/m , BMI Body mass index is 20.27 kg/m.  Wt Readings from Last 3 Encounters:  12/11/19 114 lb 6.4 oz (51.9 kg)  11/30/19 115 lb 11.9 oz (52.5 kg)  11/21/19 152 lb (68.9 kg)    General: Patient appears comfortable at rest. Neck: Supple, no elevated JVP or carotid bruits, no thyromegaly. Lungs: Clear to auscultation, nonlabored breathing at rest. Cardiac: Irregularly irregular rate and rhythm, no S3 or significant systolic murmur, no pericardial rub. Extremities mild nonpitting pitting edema, distal pulses 2+. Skin: Warm and dry. Neuropsychiatric: Alert and oriented x3, affect grossly appropriate.  ECG:  An ECG dated November 22, 2019 was personally reviewed today and demonstrated:  EKG today shows normal sinus rhythm possible left atrial enlargement, left axis deviation, right bundle branch block, inferior infarct age undetermined rate of 63  Recent Labwork: 06/28/2019: ALT 55; AST 31 11/07/2019: NT-Pro BNP 12,815 11/21/2019: B Natriuretic Peptide 3,536.1; Hemoglobin 13.0; Platelets 91; TSH 4.484 11/29/2019: Magnesium 1.8 11/30/2019: BUN 38; Creatinine, Ser 1.48; Potassium 3.5; Sodium 134     Component Value Date/Time   CHOL 136 05/29/2019 0946   TRIG 65.0 05/29/2019 0946   HDL  51.60 05/29/2019 0946   CHOLHDL 3 05/29/2019 0946   VLDL 13.0 05/29/2019 0946   LDLCALC 72 05/29/2019 0946    Other Studies Reviewed Today:  Echocardiogram November 22, 2019 1. Left ventricular ejection fraction, by visual estimation, is 30 to 35%. The left ventricle has moderate to severely decreased function. There is no left ventricular hypertrophy. 2. The left ventricle demonstrates global hypokinesis  with inferior/inferoseptal akinesis. Apex with significant trabeculations, no clear thrombus seen but would consider limited TTE with contrast to rule out apical thrombus 3. Left ventricular diastolic parameters are consistent with Grade II diastolic dysfunction (pseudonormalization). 4. Elevated left atrial pressure. 5. Global right ventricle has moderately reduced systolic function.The right ventricular size is mildly enlarged. 6. Moderate pleural effusion in the left lateral region. 7. The mitral valve is normal in structure. Mild mitral valve regurgitation. 8. The tricuspid valve is normal in structure. 9. The aortic valve is abnormal. Moderately thickened leaflets. Aortic valve regurgitation is not visualized. Mild to moderate aortic valve sclerosis/calcification without any evidence of aortic stenosis. 10. The pulmonic valve was grossly normal. Pulmonic valve regurgitation is trivial. 11. Left atrial size was mildly dilated. 12. Right atrial size was moderately dilated. 13. The inferior vena cava is normal in size with <50% respiratory variability, suggesting right atrial pressure of 8 mmHg. 14. The tricuspid regurgitant velocity is 2.81 m/s, and with an assumed right atrial pressure of 8 mmHg, the estimated right ventricular systolic pressure is mildly elevated at 39.6 mmHg  CT chest without contrast August 07, 2019 to evaluate for sarcoid IMPRESSION: 1. Similar appearance of bilateral upper lung zone predominant partially calcified mediastinal and hilar adenopathy,  perilymphatic nodularity, ground-glass attenuation, and thickening of the peribronchovascular interstitium. Spectrum compatible with the suspected diagnosis of sarcoid. 2. The cavitary lung mass within the left upper lobe is no longer cavitary and appears decreased in size from previous exam. 3. Posterior right middle lobe and anterior right upper lobe perifissural nodules are stable in the interval. 4. Persistent bilateral pleural effusions 5. Aortic Atherosclerosis (ICD10-I70.0). Coronary artery calcifications.   Assessment and Plan:  1. Acute on chronic systolic CHF (congestive heart failure), NYHA class 4 ([place])   2. NICM (nonischemic cardiomyopathy) ([place])   3. Essential hypertension   4. Hypothyroidism, unspecified type   5. Sarcoidosis    1. Acute on chronic systolic CHF (congestive heart failure), NYHA class 4 ([facility]) Recent echocardiogram November 22, 2019 EF 30 to 35%. The left ventricle has moderate to severely decreased function. There is no left ventricular hypertrophy. The left ventricle demonstrates global hypokinesis with inferior/inferoseptal akinesis. Apex with significant trabeculations, no clear thrombus seen but would consider limited TTE with contrast to rule out apical thrombus.  Grade 2 diastolic dysfunction, mild MR. Start carvedilol 3.125 mg p.o. twice daily.  Get BMP today.  Continue Lasix 40 g daily.  Continue hydralazine 10 mg every 8 hours, continue Imdur 30 mg daily.  Continue spironolactone 25 mg daily.  Continue monitoring weight, salt intake, maintain fluid intake in the range of 32 to 64 ounces daily.  Call if increase weight 3 pounds in a day or 5 pounds in 1 week.  2. NICM (nonischemic cardiomyopathy) ([place]) Subsequent echo checking for possible left apical thrombus EF was measured at 25 to 30%  3. Essential hypertension Blood pressure within normal limits today at 120/68.  4. Hypothyroidism, unspecified type On levothyroxine 50 mcg tablets daily.   Managed by PCP  5. Sarcoidosis Pulmonary sarcoid on recent CT scan  6.  Sinus tachycardia EKG today shows sinus tachycardia with premature atrial complexes and premature ventricular complexes or fusion complexes rate of 101, possible left atrial enlargement, left axis deviation, right bundle branch block, left ventricular hypertrophy with QRS widening, anterior infarct, age undetermined.  Starting carvedilol today may decrease the heart rate to some degree.    Medication Adjustments/Labs and Tests Ordered: Current medicines are reviewed  at length with the patient today.  Concerns regarding medicines are outlined above.    Patient Instructions  Medication Instructions:   CARVEDILOL 3.125 MG 2 TIMES A DAY  *If you need a refill on your cardiac medications before your next appointment, please call your pharmacy*  Lab Work: Your physician recommends that you return for lab work TODAY:  BMET If you have labs (blood work) drawn today and your tests are completely normal, you will receive your results only by: Marland Kitchen MyChart Message (if you have MyChart) OR . A paper copy in the mail If you have any lab test that is abnormal or we need to change your treatment, we will call you to review the results.  Testing/Procedures: NONE ordered at this time of appointment   Follow-Up: At [facility], you and your health needs are our priority.  As part of our continuing mission to provide you with exceptional heart care, we have created designated Provider Care Teams.  These Care Teams include your primary Cardiologist (physician) and Advanced Practice Providers (APPs -  Physician Assistants and Nurse Practitioners) who all work together to provide you with the care you need, when you need it.  Your next appointment:   1-2 month(s)  The format for your next appointment:   In Person  Provider:   [name], MD or Almyra Deforest, PA-C  Other Instructions          Signed, Levell July,  NP 12/11/2019 10:42 AM    [facility] [place], [place], [place] [address] Phone: [phone]; Fax: [phone]

## 2019-12-12 NOTE — Progress Notes (Signed)
Britt Bolognese, Mrs. Castagnola renal function looks better.  Just wondering what your opinion is as to whether we need to increase or decrease diuretics.  Thanks Jonni Sanger

## 2019-12-12 NOTE — Telephone Encounter (Signed)
Requesting: xanax  Contract:yes UDS:low risk  Last OV:06/26/19 Next OV:12/15/19 Last Refill:05/15/19  #[id number] Database:   Please advise

## 2019-12-13 NOTE — Progress Notes (Signed)
I would keep at current level, she was clearly breathing better when we saw her. Even though she may still has trace amount of ankle edema, given how frail she was, I would prefer to manage that conservatively with leg elevation

## 2019-12-14 DIAGNOSIS — N1831 Chronic kidney disease, stage 3a: Secondary | ICD-10-CM | POA: Diagnosis not present

## 2019-12-14 DIAGNOSIS — I5042 Chronic combined systolic (congestive) and diastolic (congestive) heart failure: Secondary | ICD-10-CM | POA: Diagnosis not present

## 2019-12-14 DIAGNOSIS — I130 Hypertensive heart and chronic kidney disease with heart failure and stage 1 through stage 4 chronic kidney disease, or unspecified chronic kidney disease: Secondary | ICD-10-CM | POA: Diagnosis not present

## 2019-12-14 DIAGNOSIS — Z7951 Long term (current) use of inhaled steroids: Secondary | ICD-10-CM | POA: Diagnosis not present

## 2019-12-14 DIAGNOSIS — E039 Hypothyroidism, unspecified: Secondary | ICD-10-CM | POA: Diagnosis not present

## 2019-12-14 DIAGNOSIS — C8590 Non-Hodgkin lymphoma, unspecified, unspecified site: Secondary | ICD-10-CM | POA: Diagnosis not present

## 2019-12-15 ENCOUNTER — Ambulatory Visit (INDEPENDENT_AMBULATORY_CARE_PROVIDER_SITE_OTHER): Payer: Medicare Other | Admitting: Family Medicine

## 2019-12-15 ENCOUNTER — Other Ambulatory Visit: Payer: Self-pay

## 2019-12-15 ENCOUNTER — Telehealth: Payer: Self-pay | Admitting: Family Medicine

## 2019-12-15 DIAGNOSIS — I10 Essential (primary) hypertension: Secondary | ICD-10-CM | POA: Diagnosis not present

## 2019-12-15 DIAGNOSIS — R739 Hyperglycemia, unspecified: Secondary | ICD-10-CM | POA: Diagnosis not present

## 2019-12-15 DIAGNOSIS — I5023 Acute on chronic systolic (congestive) heart failure: Secondary | ICD-10-CM

## 2019-12-15 DIAGNOSIS — K5900 Constipation, unspecified: Secondary | ICD-10-CM | POA: Diagnosis not present

## 2019-12-15 DIAGNOSIS — R0902 Hypoxemia: Secondary | ICD-10-CM

## 2019-12-15 DIAGNOSIS — E559 Vitamin D deficiency, unspecified: Secondary | ICD-10-CM | POA: Diagnosis not present

## 2019-12-15 DIAGNOSIS — N1831 Chronic kidney disease, stage 3a: Secondary | ICD-10-CM | POA: Diagnosis not present

## 2019-12-15 DIAGNOSIS — E039 Hypothyroidism, unspecified: Secondary | ICD-10-CM | POA: Diagnosis not present

## 2019-12-15 NOTE — Telephone Encounter (Addendum)
Patient's son Crystal [name] called and states his mother had an appointment with Dr Charlett Blake this morning , Patient forgot to ask if she should continue her oxygen at home or not after being discharged from hospital.  Please advise, patient is looking for a call back.

## 2019-12-16 DIAGNOSIS — I5042 Chronic combined systolic (congestive) and diastolic (congestive) heart failure: Secondary | ICD-10-CM | POA: Diagnosis not present

## 2019-12-16 DIAGNOSIS — Z7951 Long term (current) use of inhaled steroids: Secondary | ICD-10-CM | POA: Diagnosis not present

## 2019-12-16 DIAGNOSIS — I130 Hypertensive heart and chronic kidney disease with heart failure and stage 1 through stage 4 chronic kidney disease, or unspecified chronic kidney disease: Secondary | ICD-10-CM | POA: Diagnosis not present

## 2019-12-16 DIAGNOSIS — N1831 Chronic kidney disease, stage 3a: Secondary | ICD-10-CM | POA: Diagnosis not present

## 2019-12-16 DIAGNOSIS — E039 Hypothyroidism, unspecified: Secondary | ICD-10-CM | POA: Diagnosis not present

## 2019-12-16 DIAGNOSIS — C8590 Non-Hodgkin lymphoma, unspecified, unspecified site: Secondary | ICD-10-CM | POA: Diagnosis not present

## 2019-12-17 DIAGNOSIS — K5900 Constipation, unspecified: Secondary | ICD-10-CM | POA: Insufficient documentation

## 2019-12-17 NOTE — Progress Notes (Signed)
Virtual Visit via phone Note  I connected with Crystal [name] on 12/15/19 at  9:40 AM EST by a phone enabled telemedicine application and verified that I am speaking with the correct person using two identifiers.  Location: Patient: home Provider: home   I discussed the limitations of evaluation and management by telemedicine and the availability of in person appointments. The patient expressed understanding and agreed to proceed. Magdalene Molly, CMA was able to get the patient set up on a visit, phone after being unable to set up a video visit   Subjective:    Patient ID: Crystal [name], female    DOB: June 28, 1934, 86 y.o.   MRN: [id number]  No chief complaint on file.   HPI Patient is in today for follow up on chronic medical concerns and recent hospitalizations. She is feeling well today and is doing much better at home since being hospitalized. She has been weighing herself daily since coming home after being hospitalized with acute episode of congestive heart failure. Her weight has been stable  And her breathing has been stable also. She has noted mild constipation and has been using a stool softener prn with good results. No bloody or tarry stool. Denies CP/palp/HA/congestion/fevers or GU c/o. Taking meds as prescribed  Past Medical History:  Diagnosis Date  . Anemia 08/21/2017  . Anxiety   . Cardiomyopathy    non ischemic NL cos on cath 08/2009, EF to 15-20%, Her follow up  2D echo  in 10/2009 showed impoved EFo 35-40%  . CHF (congestive heart failure) ([place])   . Cough   . Depression   . Dyspnea   . Edema of leg   . Full dentures   . Hypercalcemia 04/20/2017  . Hypertension   . Hypothyroid   . Lung mass   . Medicare annual wellness visit, subsequent 12/14/2014   Follows with Dr Marin Olp Follows with Dr Deatra Ina, gastroenterology, last colonoscopy in 2012, repeat in 2017 Last Pap 2011, always normal, no need for repeat Last MGM in 2011, no concerns, declines for now Follows with Dr  Stanford Breed of cardiology   . Non Hodgkin's lymphoma ([place]) 11/17/1999   -Dr. Marin Olp  . Pleural effusion   . Vitamin D deficiency 10/17/2015  . Wears glasses     Past Surgical History:  Procedure Laterality Date  . DILATION AND CURETTAGE OF UTERUS    . MEDIASTINOSCOPY N/A 05/24/2019   Procedure: MEDIASTINOSCOPY;  Surgeon: Melrose Nakayama, MD;  Location: [facility] OR;  Service: Thoracic;  Laterality: N/A;  . MULTIPLE TOOTH EXTRACTIONS    . NASAL POLYP EXCISION  1962  . TONSILLECTOMY  1952  . VIDEO BRONCHOSCOPY WITH ENDOBRONCHIAL NAVIGATION Right 06/29/2018   Procedure: VIDEO BRONCHOSCOPY WITH ENDOBRONCHIAL NAVIGATION;  Surgeon: Collene Gobble, MD;  Location: [facility];  Service: Thoracic;  Laterality: Right;  Marland Kitchen VIDEO BRONCHOSCOPY WITH ENDOBRONCHIAL NAVIGATION N/A 05/24/2019   Procedure: VIDEO BRONCHOSCOPY WITH ENDOBRONCHIAL NAVIGATION;  Surgeon: Melrose Nakayama, MD;  Location: [facility];  Service: Thoracic;  Laterality: N/A;  . VIDEO BRONCHOSCOPY WITH ENDOBRONCHIAL ULTRASOUND Right 06/29/2018   Procedure: VIDEO BRONCHOSCOPY WITH ENDOBRONCHIAL ULTRASOUND;  Surgeon: Collene Gobble, MD;  Location: [facility];  Service: Thoracic;  Laterality: Right;  Marland Kitchen VIDEO BRONCHOSCOPY WITH ENDOBRONCHIAL ULTRASOUND N/A 05/24/2019   Procedure: VIDEO BRONCHOSCOPY WITH ENDOBRONCHIAL ULTRASOUND;  Surgeon: Melrose Nakayama, MD;  Location: [facility] OR;  Service: Thoracic;  Laterality: N/A;    Family History  Problem Relation Age of Onset  . Diabetes Mother  deceased secondary to diabetes  . Hypertension Mother   . Vision loss Mother   . Pneumonia Father        died age 15 due to complications of pneumonia  . Colon polyps Father   . Liver disease Daughter   . Sarcoidosis Daughter   . Diabetes Sister   . Colon cancer Brother 8  . Cancer Brother   . Obesity Son   . Diabetes Brother   . Kidney disease Brother   . Diabetes Sister   . Sarcoidosis Sister   . Arthritis Sister   . Arthritis Sister   . Diabetes  Sister   . Arthritis Sister   . Diabetes Sister   . Hypertension Daughter   . Hypertension Daughter   . Other Other        no obvious premature cardiovascular disease or familial cardiomyopathy is noted  . Alcohol abuse Neg Hx   . Heart disease Neg Hx     Social History   Socioeconomic History  . Marital status: Married    Spouse name: Jeneen Rinks  . Number of children: 5  . Years of education: Not on file  . Highest education level: Not on file  Occupational History  . Occupation: retired  Tobacco Use  . Smoking status: Never Smoker  . Smokeless tobacco: Never Used  Substance and Sexual Activity  . Alcohol use: Never    Alcohol/week: 0.0 standard drinks  . Drug use: Never  . Sexual activity: Yes    Comment: lives with husband, no dietary restrictions, minimizes dairy  Other Topics Concern  . Not on file  Social History Narrative   The patient is married ( husband Veronika Heard)  She just recently celebrated     her 52nd anniversary.  She has five children.  There is no active     tobacco or alcohol use history.          Smoking Status:  never   Caffeine use/day:  None   Does Patient Exercise:  yes   Social Determinants of Health   Financial Resource Strain:   . Difficulty of Paying Living Expenses: Not on file  Food Insecurity:   . Worried About Charity fundraiser in the Last Year: Not on file  . Ran Out of Food in the Last Year: Not on file  Transportation Needs:   . Lack of Transportation (Medical): Not on file  . Lack of Transportation (Non-Medical): Not on file  Physical Activity:   . Days of Exercise per Week: Not on file  . Minutes of Exercise per Session: Not on file  Stress:   . Feeling of Stress : Not on file  Social Connections:   . Frequency of Communication with Friends and Family: Not on file  . Frequency of Social Gatherings with Friends and Family: Not on file  . Attends Religious Services: Not on file  . Active Member of Clubs or Organizations: Not  on file  . Attends Archivist Meetings: Not on file  . Marital Status: Not on file  Intimate Partner Violence:   . Fear of Current or Ex-Partner: Not on file  . Emotionally Abused: Not on file  . Physically Abused: Not on file  . Sexually Abused: Not on file    Outpatient Medications Prior to Visit  Medication Sig Dispense Refill  . acetaminophen (TYLENOL) 325 MG tablet Take 2 tablets (650 mg total) by mouth every 6 (six) hours as needed for fever, headache, mild pain  or moderate pain. (Patient not taking: Reported on 12/11/2019)    . albuterol (VENTOLIN HFA) 108 (90 Base) MCG/ACT inhaler INHALE 1 TO 2 PUFFS BY MOUTH EVERY 4 HOURS AS NEEDED FOR WHEEZING FOR SHORTNESS OF BREATH (Patient taking differently: Inhale 1 puff into the lungs every 6 (six) hours as needed for shortness of breath. ) 18 g 0  . ALPRAZolam (XANAX) 0.25 MG tablet Take 1 tablet by mouth twice daily as needed for anxiety 30 tablet 1  . benzonatate (TESSALON) 100 MG capsule TAKE 1 TO 2 CAPSULES BY MOUTH THREE TIMES DAILY AS NEEDED FOR COUGH (Patient not taking: No sig reported) 60 capsule 0  . carvedilol (COREG) 3.125 MG tablet Take 1 tablet (3.125 mg total) by mouth 2 (two) times daily. 180 tablet 0  . furosemide (LASIX) 40 MG tablet Take 1 tablet (40 mg total) by mouth daily. OR AS DIRECTED 45 tablet 3  . hydrALAZINE (APRESOLINE) 10 MG tablet Take 1 tablet (10 mg total) by mouth every 8 (eight) hours. 90 tablet 3  . isosorbide mononitrate (IMDUR) 30 MG 24 hr tablet Take 0.5 tablets (15 mg total) by mouth daily. 20 tablet 3  . levothyroxine (SYNTHROID) 50 MCG tablet Take 1 tablet (50 mcg total) by mouth daily. 90 tablet 3  . Multiple Vitamins-Minerals (CENTRUM PO) Take 1 tablet by mouth daily.     . Omega-3 Fatty Acids (FISH OIL) 1000 MG CAPS Take 1,000 mg by mouth daily.    . Probiotic Product (PROBIOTIC DAILY PO) Take 1 capsule by mouth daily.     Marland Kitchen spironolactone (ALDACTONE) 25 MG tablet Take 0.5 tablets (12.5 mg  total) by mouth daily. 20 tablet 3   No facility-administered medications prior to visit.    Allergies  Allergen Reactions  . Lisinopril Swelling    Facial and lip swelling  . Hydralazine Hcl Other (See Comments)    hypotension  . Iohexol Itching         Review of Systems  Constitutional: Positive for malaise/fatigue. Negative for fever.  HENT: Negative for congestion.   Eyes: Negative for blurred vision.  Respiratory: Positive for shortness of breath.   Cardiovascular: Positive for leg swelling. Negative for chest pain and palpitations.  Gastrointestinal: Negative for abdominal pain, blood in stool and nausea.  Genitourinary: Negative for dysuria and frequency.  Musculoskeletal: Negative for falls.  Skin: Negative for rash.  Neurological: Negative for dizziness, loss of consciousness and headaches.  Endo/Heme/Allergies: Negative for environmental allergies.  Psychiatric/Behavioral: Negative for depression. The patient is not nervous/anxious.        Objective:    Physical Exam unable to obtain via phone visit.   There were no vitals taken for this visit. Wt Readings from Last 3 Encounters:  12/11/19 114 lb 6.4 oz (51.9 kg)  11/30/19 115 lb 11.9 oz (52.5 kg)  11/21/19 152 lb (68.9 kg)    Diabetic Foot Exam - Simple   No data filed     Lab Results  Component Value Date   WBC 4.3 11/21/2019   HGB 13.0 11/21/2019   HCT 41.2 11/21/2019   PLT 91 (L) 11/21/2019   GLUCOSE 95 12/11/2019   CHOL 136 05/29/2019   TRIG 65.0 05/29/2019   HDL 51.60 05/29/2019   LDLCALC 72 05/29/2019   ALT 55 (H) 06/28/2019   AST 31 06/28/2019   NA 135 12/11/2019   K 4.2 12/11/2019   CL 98 12/11/2019   CREATININE 1.34 (H) 12/11/2019   BUN 36 (H) 12/11/2019  CO2 22 12/11/2019   TSH 4.484 11/21/2019   INR 1.3 (H) 05/22/2019   HGBA1C 5.9 05/29/2019    Lab Results  Component Value Date   TSH 4.484 11/21/2019   Lab Results  Component Value Date   WBC 4.3 11/21/2019   HGB 13.0  11/21/2019   HCT 41.2 11/21/2019   MCV 96.3 11/21/2019   PLT 91 (L) 11/21/2019   Lab Results  Component Value Date   NA 135 12/11/2019   K 4.2 12/11/2019   CHLORIDE 101 01/21/2017   CO2 22 12/11/2019   GLUCOSE 95 12/11/2019   BUN 36 (H) 12/11/2019   CREATININE 1.34 (H) 12/11/2019   BILITOT 0.8 06/28/2019   ALKPHOS 102 06/28/2019   AST 31 06/28/2019   ALT 55 (H) 06/28/2019   PROT 8.2 (H) 06/28/2019   ALBUMIN 3.5 06/28/2019   CALCIUM 9.9 12/11/2019   ANIONGAP 9 11/30/2019   EGFR 29 (L) 01/21/2017   GFR 47.71 (L) 05/29/2019   Lab Results  Component Value Date   CHOL 136 05/29/2019   Lab Results  Component Value Date   HDL 51.60 05/29/2019   Lab Results  Component Value Date   LDLCALC 72 05/29/2019   Lab Results  Component Value Date   TRIG 65.0 05/29/2019   Lab Results  Component Value Date   CHOLHDL 3 05/29/2019   Lab Results  Component Value Date   HGBA1C 5.9 05/29/2019       Assessment & Plan:   Problem List Items Addressed This Visit    Hypothyroidism    On Levothyroxine, continue to monitor      Essential hypertension    Monitor and report any concerns. no changes to meds. Encouraged heart healthy diet such as the DASH diet and exercise as tolerated.       Hyperglycemia    hgba1c acceptable, minimize simple carbs. Increase exercise as tolerated.       Vitamin D deficiency    Supplement and monitor      CKD stage G3a/A1, GFR 45-59 and albumin creatinine ratio <30 mg/g    Hydrate and monitor      Hypoxia    Has been recently hospitalized but is home now and feeling much better. She has not been using her oxygen and is wondering if she should keep it. Her SOB has been stable and manageable. She is asked to check her oxygen on RA at rest and with exertion and if her numbers drop into the 80s she will need to keep her oxygen in the house to use prn.       CHF (congestive heart failure) ([place])    Recently hospitalized with an exacerbation of  heart failure. Is doing much better at home and weighing herself daily. Is watching her salt intake and fluid intake and feeling better. She is advised to stay as active as able. Use compression hose and elevate feet above heart 15 minutes several times a day. Follow up with cardiology as scheduled.       Constipation    Encouraged increased hydration and fiber in diet. Daily probiotics. If bowels not moving can use MOM 2 tbls po in 4 oz of warm prune juice by mouth every 2-3 days. If no results then repeat in 4 hours with  Dulcolax suppository pr, may repeat again in 4 more hours as needed. Seek care if symptoms worsen. Consider daily Miralax and/or Dulcolax if symptoms persist. Daily Miralax and Benefiber encouraged  I am having Crystal [name] maintain her Multiple Vitamins-Minerals (CENTRUM PO), Probiotic Product (PROBIOTIC DAILY PO), Fish Oil, acetaminophen, levothyroxine, albuterol, benzonatate, furosemide, hydrALAZINE, isosorbide mononitrate, spironolactone, carvedilol, and ALPRAZolam.  No orders of the defined types were placed in this encounter.    I discussed the assessment and treatment plan with the patient. The patient was provided an opportunity to ask questions and all were answered. The patient agreed with the plan and demonstrated an understanding of the instructions.   The patient was advised to call back or seek an in-person evaluation if the symptoms worsen or if the condition fails to improve as anticipated.  I provided 25 minutes of non-face-to-face time during this encounter.   Penni Homans, MD

## 2019-12-17 NOTE — Assessment & Plan Note (Signed)
On Levothyroxine, continue to monitor 

## 2019-12-17 NOTE — Assessment & Plan Note (Signed)
Monitor and report any concerns. no changes to meds. Encouraged heart healthy diet such as the DASH diet and exercise as tolerated.  

## 2019-12-17 NOTE — Assessment & Plan Note (Signed)
Hydrate and monitor 

## 2019-12-17 NOTE — Assessment & Plan Note (Signed)
hgba1c acceptable, minimize simple carbs. Increase exercise as tolerated.  

## 2019-12-17 NOTE — Assessment & Plan Note (Signed)
Has been recently hospitalized but is home now and feeling much better. She has not been using her oxygen and is wondering if she should keep it. Her SOB has been stable and manageable. She is asked to check her oxygen on RA at rest and with exertion and if her numbers drop into the 80s she will need to keep her oxygen in the house to use prn.

## 2019-12-17 NOTE — Telephone Encounter (Signed)
We did talk about that what I said was she should monitor her oxygen levels at risk and with walking at home without oxygen on and if she drops to less than 88% we will have to leave the oxygen in the home and she can use it as needed. If she does not drop we will have home health come remove it. We are supposed to have a follow up appt in 3-4 weeks to discuss. Preferably video so we can get the DME approval done to continue the oxygen if she ends up needing it.

## 2019-12-17 NOTE — Assessment & Plan Note (Signed)
Encouraged increased hydration and fiber in diet. Daily probiotics. If bowels not moving can use MOM 2 tbls po in 4 oz of warm prune juice by mouth every 2-3 days. If no results then repeat in 4 hours with  Dulcolax suppository pr, may repeat again in 4 more hours as needed. Seek care if symptoms worsen. Consider daily Miralax and/or Dulcolax if symptoms persist. Daily Miralax and Benefiber encouraged

## 2019-12-17 NOTE — Assessment & Plan Note (Signed)
Supplement and monitor 

## 2019-12-17 NOTE — Assessment & Plan Note (Signed)
Recently hospitalized with an exacerbation of heart failure. Is doing much better at home and weighing herself daily. Is watching her salt intake and fluid intake and feeling better. She is advised to stay as active as able. Use compression hose and elevate feet above heart 15 minutes several times a day. Follow up with cardiology as scheduled.

## 2019-12-18 DIAGNOSIS — N1831 Chronic kidney disease, stage 3a: Secondary | ICD-10-CM | POA: Diagnosis not present

## 2019-12-18 DIAGNOSIS — I5042 Chronic combined systolic (congestive) and diastolic (congestive) heart failure: Secondary | ICD-10-CM | POA: Diagnosis not present

## 2019-12-18 DIAGNOSIS — I130 Hypertensive heart and chronic kidney disease with heart failure and stage 1 through stage 4 chronic kidney disease, or unspecified chronic kidney disease: Secondary | ICD-10-CM | POA: Diagnosis not present

## 2019-12-18 DIAGNOSIS — E039 Hypothyroidism, unspecified: Secondary | ICD-10-CM

## 2019-12-18 DIAGNOSIS — Z9181 History of falling: Secondary | ICD-10-CM

## 2019-12-18 DIAGNOSIS — C8590 Non-Hodgkin lymphoma, unspecified, unspecified site: Secondary | ICD-10-CM | POA: Diagnosis not present

## 2019-12-18 DIAGNOSIS — Z7951 Long term (current) use of inhaled steroids: Secondary | ICD-10-CM

## 2019-12-18 NOTE — Telephone Encounter (Signed)
Left detailed message for patients son

## 2019-12-19 DIAGNOSIS — E039 Hypothyroidism, unspecified: Secondary | ICD-10-CM | POA: Diagnosis not present

## 2019-12-19 DIAGNOSIS — I5042 Chronic combined systolic (congestive) and diastolic (congestive) heart failure: Secondary | ICD-10-CM | POA: Diagnosis not present

## 2019-12-19 DIAGNOSIS — Z7951 Long term (current) use of inhaled steroids: Secondary | ICD-10-CM | POA: Diagnosis not present

## 2019-12-19 DIAGNOSIS — I130 Hypertensive heart and chronic kidney disease with heart failure and stage 1 through stage 4 chronic kidney disease, or unspecified chronic kidney disease: Secondary | ICD-10-CM | POA: Diagnosis not present

## 2019-12-19 DIAGNOSIS — N1831 Chronic kidney disease, stage 3a: Secondary | ICD-10-CM | POA: Diagnosis not present

## 2019-12-19 DIAGNOSIS — C8590 Non-Hodgkin lymphoma, unspecified, unspecified site: Secondary | ICD-10-CM | POA: Diagnosis not present

## 2019-12-21 DIAGNOSIS — Z7951 Long term (current) use of inhaled steroids: Secondary | ICD-10-CM | POA: Diagnosis not present

## 2019-12-21 DIAGNOSIS — I5042 Chronic combined systolic (congestive) and diastolic (congestive) heart failure: Secondary | ICD-10-CM | POA: Diagnosis not present

## 2019-12-21 DIAGNOSIS — I130 Hypertensive heart and chronic kidney disease with heart failure and stage 1 through stage 4 chronic kidney disease, or unspecified chronic kidney disease: Secondary | ICD-10-CM | POA: Diagnosis not present

## 2019-12-21 DIAGNOSIS — C8590 Non-Hodgkin lymphoma, unspecified, unspecified site: Secondary | ICD-10-CM | POA: Diagnosis not present

## 2019-12-21 DIAGNOSIS — N1831 Chronic kidney disease, stage 3a: Secondary | ICD-10-CM | POA: Diagnosis not present

## 2019-12-21 DIAGNOSIS — E039 Hypothyroidism, unspecified: Secondary | ICD-10-CM | POA: Diagnosis not present

## 2019-12-22 DIAGNOSIS — N1831 Chronic kidney disease, stage 3a: Secondary | ICD-10-CM | POA: Diagnosis not present

## 2019-12-22 DIAGNOSIS — I5042 Chronic combined systolic (congestive) and diastolic (congestive) heart failure: Secondary | ICD-10-CM | POA: Diagnosis not present

## 2019-12-22 DIAGNOSIS — E039 Hypothyroidism, unspecified: Secondary | ICD-10-CM | POA: Diagnosis not present

## 2019-12-22 DIAGNOSIS — Z7951 Long term (current) use of inhaled steroids: Secondary | ICD-10-CM | POA: Diagnosis not present

## 2019-12-22 DIAGNOSIS — I130 Hypertensive heart and chronic kidney disease with heart failure and stage 1 through stage 4 chronic kidney disease, or unspecified chronic kidney disease: Secondary | ICD-10-CM | POA: Diagnosis not present

## 2019-12-22 DIAGNOSIS — C8590 Non-Hodgkin lymphoma, unspecified, unspecified site: Secondary | ICD-10-CM | POA: Diagnosis not present

## 2019-12-27 ENCOUNTER — Inpatient Hospital Stay (HOSPITAL_BASED_OUTPATIENT_CLINIC_OR_DEPARTMENT_OTHER): Payer: Medicare Other | Admitting: Hematology & Oncology

## 2019-12-27 ENCOUNTER — Other Ambulatory Visit: Payer: Self-pay

## 2019-12-27 ENCOUNTER — Telehealth: Payer: Self-pay | Admitting: Hematology & Oncology

## 2019-12-27 ENCOUNTER — Encounter: Payer: Self-pay | Admitting: Hematology & Oncology

## 2019-12-27 ENCOUNTER — Inpatient Hospital Stay: Payer: Medicare Other | Attending: Hematology & Oncology

## 2019-12-27 VITALS — BP 109/62 | HR 87 | Temp 97.1°F | Resp 24 | Wt 123.0 lb

## 2019-12-27 DIAGNOSIS — C8331 Diffuse large B-cell lymphoma, lymph nodes of head, face, and neck: Secondary | ICD-10-CM

## 2019-12-27 DIAGNOSIS — I509 Heart failure, unspecified: Secondary | ICD-10-CM | POA: Diagnosis not present

## 2019-12-27 DIAGNOSIS — D869 Sarcoidosis, unspecified: Secondary | ICD-10-CM | POA: Diagnosis not present

## 2019-12-27 DIAGNOSIS — Z8572 Personal history of non-Hodgkin lymphomas: Secondary | ICD-10-CM | POA: Insufficient documentation

## 2019-12-27 LAB — CBC WITH DIFFERENTIAL (CANCER CENTER ONLY)
Abs Immature Granulocytes: 0.02 10*3/uL (ref 0.00–0.07)
Basophils Absolute: 0 10*3/uL (ref 0.0–0.1)
Basophils Relative: 1 %
Eosinophils Absolute: 0.1 10*3/uL (ref 0.0–0.5)
Eosinophils Relative: 1 %
HCT: 36.4 % (ref 36.0–46.0)
Hemoglobin: 11.7 g/dL — ABNORMAL LOW (ref 12.0–15.0)
Immature Granulocytes: 1 %
Lymphocytes Relative: 21 %
Lymphs Abs: 0.8 10*3/uL (ref 0.7–4.0)
MCH: 29.5 pg (ref 26.0–34.0)
MCHC: 32.1 g/dL (ref 30.0–36.0)
MCV: 91.7 fL (ref 80.0–100.0)
Monocytes Absolute: 0.3 10*3/uL (ref 0.1–1.0)
Monocytes Relative: 8 %
Neutro Abs: 2.5 10*3/uL (ref 1.7–7.7)
Neutrophils Relative %: 68 %
Platelet Count: 138 10*3/uL — ABNORMAL LOW (ref 150–400)
RBC: 3.97 MIL/uL (ref 3.87–5.11)
RDW: 15.7 % — ABNORMAL HIGH (ref 11.5–15.5)
WBC Count: 3.6 10*3/uL — ABNORMAL LOW (ref 4.0–10.5)
nRBC: 0 % (ref 0.0–0.2)

## 2019-12-27 LAB — CMP (CANCER CENTER ONLY)
ALT: 22 U/L (ref 0–44)
AST: 22 U/L (ref 15–41)
Albumin: 3.5 g/dL (ref 3.5–5.0)
Alkaline Phosphatase: 178 U/L — ABNORMAL HIGH (ref 38–126)
Anion gap: 6 (ref 5–15)
BUN: 36 mg/dL — ABNORMAL HIGH (ref 8–23)
CO2: 28 mmol/L (ref 22–32)
Calcium: 10.8 mg/dL — ABNORMAL HIGH (ref 8.9–10.3)
Chloride: 101 mmol/L (ref 98–111)
Creatinine: 1.55 mg/dL — ABNORMAL HIGH (ref 0.44–1.00)
GFR, Est AFR Am: 36 mL/min — ABNORMAL LOW (ref >60)
GFR, Estimated: 31 mL/min — ABNORMAL LOW (ref >60)
Glucose, Bld: 125 mg/dL — ABNORMAL HIGH (ref 70–99)
Potassium: 4.5 mmol/L (ref 3.5–5.1)
Sodium: 135 mmol/L (ref 135–145)
Total Bilirubin: 1.6 mg/dL — ABNORMAL HIGH (ref 0.3–1.2)
Total Protein: 9 g/dL — ABNORMAL HIGH (ref 6.5–8.1)

## 2019-12-27 LAB — LACTATE DEHYDROGENASE: LDH: 251 U/L — ABNORMAL HIGH (ref 98–192)

## 2019-12-27 NOTE — Progress Notes (Signed)
Hematology and Oncology Follow Up Visit  AHLEY [name] [id number] [birth year] 86 y.o. 12/27/2019   Principle Diagnosis:  Diffuse large cell non-Hodgkin's lymphoma-remission LEFT Lung mass -- Sarcoidosis  Current Therapy:   Observation   Interim History:  [name] is here today for follow-up.  Unfortunately, she was hospitalized in early January.  She had congestive heart failure.  Her weight was up to almost 150 pounds.  She had an echocardiogram done.  She had ejection fraction of 35-40%.  I think some of her medications were adjusted a little bit.  She is feeling better right now.  So far, there is been no problems with respect to lymphoma.  She was found to have sarcoidosis.  This was on a mediastinoscopy that she had done back in the summertime.  She is having little bit of a hard time sleeping.  This certainly is not surprising.  She has had no fever.  She has had no bleeding.  There is no change in bowel or bladder habits.  She has had no nausea or vomiting.  Overall, her performance status is ECOG 2.     Medications:  Allergies as of 12/27/2019      Reactions   Lisinopril Swelling   Facial and lip swelling   Hydralazine Hcl Other (See Comments)   hypotension   Iohexol Itching         Medication List       Accurate as of December 27, 2019  1:01 PM. If you have any questions, ask your nurse or doctor.        acetaminophen 325 MG tablet Commonly known as: TYLENOL Take 2 tablets (650 mg total) by mouth every 6 (six) hours as needed for fever, headache, mild pain or moderate pain.   albuterol 108 (90 Base) MCG/ACT inhaler Commonly known as: VENTOLIN HFA INHALE 1 TO 2 PUFFS BY MOUTH EVERY 4 HOURS AS NEEDED FOR WHEEZING FOR SHORTNESS OF BREATH   ALPRAZolam 0.25 MG tablet Commonly known as: XANAX Take 1 tablet by mouth twice daily as needed for anxiety   benzonatate 100 MG capsule Commonly known as: TESSALON TAKE 1 TO 2 CAPSULES BY MOUTH THREE TIMES DAILY AS  NEEDED FOR COUGH   carvedilol 3.125 MG tablet Commonly known as: COREG Take 1 tablet (3.125 mg total) by mouth 2 (two) times daily.   CENTRUM PO Take 1 tablet by mouth daily.   Fish Oil 1000 MG Caps Take 1,000 mg by mouth daily.   furosemide 40 MG tablet Commonly known as: LASIX Take 1 tablet (40 mg total) by mouth daily. OR AS DIRECTED   hydrALAZINE 10 MG tablet Commonly known as: APRESOLINE Take 1 tablet (10 mg total) by mouth every 8 (eight) hours.   isosorbide mononitrate 30 MG 24 hr tablet Commonly known as: IMDUR Take 0.5 tablets (15 mg total) by mouth daily.   levothyroxine 50 MCG tablet Commonly known as: SYNTHROID Take 1 tablet (50 mcg total) by mouth daily.   PROBIOTIC DAILY PO Take 1 capsule by mouth daily.   spironolactone 25 MG tablet Commonly known as: ALDACTONE Take 0.5 tablets (12.5 mg total) by mouth daily.       Allergies:  Allergies  Allergen Reactions  . Lisinopril Swelling    Facial and lip swelling  . Hydralazine Hcl Other (See Comments)    hypotension  . Iohexol Itching         Past Medical History, Surgical history, Social history, and Family History were reviewed and updated.  Review of Systems: Review of Systems  Constitutional: Positive for malaise/fatigue.  HENT: Negative.   Eyes: Negative.   Respiratory: Positive for shortness of breath and wheezing.   Cardiovascular: Positive for chest pain.  Gastrointestinal: Negative.   Genitourinary: Negative.   Musculoskeletal: Positive for myalgias.  Skin: Negative.   Neurological: Negative.   Endo/Heme/Allergies: Negative.   Psychiatric/Behavioral: Negative.       Physical Exam:  weight is 123 lb (55.8 kg). Her temporal temperature is 97.1 F (36.2 C) (abnormal). Her blood pressure is 109/62 and her pulse is 87. Her respiration is 24 (abnormal) and oxygen saturation is 100%.   Wt Readings from Last 3 Encounters:  12/27/19 123 lb (55.8 kg)  12/11/19 114 lb 6.4 oz (51.9 kg)    11/30/19 115 lb 11.9 oz (52.5 kg)    Physical Exam Vitals reviewed.  HENT:     Head: Normocephalic and atraumatic.  Eyes:     Pupils: Pupils are equal, round, and reactive to light.  Cardiovascular:     Rate and Rhythm: Normal rate and regular rhythm.     Heart sounds: Normal heart sounds.     Comments: Card exam is regular rate and rhythm.  There may be some increase in rate.  There is no murmurs, rubs or bruits. Pulmonary:     Effort: Pulmonary effort is normal.     Breath sounds: Normal breath sounds.     Comments: Lung exam shows decent breath sounds bilaterally.  There is occasional wheezing.  There might be some slight decrease of breath sounds over on the left lung. Abdominal:     General: Bowel sounds are normal.     Palpations: Abdomen is soft.  Musculoskeletal:        General: No tenderness or deformity. Normal range of motion.     Cervical back: Normal range of motion.  Lymphadenopathy:     Cervical: No cervical adenopathy.  Skin:    General: Skin is warm and dry.     Findings: No erythema or rash.  Neurological:     Mental Status: She is alert and oriented to person, place, and time.  Psychiatric:        Behavior: Behavior normal.        Thought Content: Thought content normal.        Judgment: Judgment normal.      Lab Results  Component Value Date   WBC 3.6 (L) 12/27/2019   HGB 11.7 (L) 12/27/2019   HCT 36.4 12/27/2019   MCV 91.7 12/27/2019   PLT 138 (L) 12/27/2019   No results found for: FERRITIN, IRON, TIBC, UIBC, IRONPCTSAT Lab Results  Component Value Date   RETICCTPCT 1.1 10/15/2011   RBC 3.97 12/27/2019   RETICCTABS 49.7 10/15/2011   No results found for: KPAFRELGTCHN, LAMBDASER, KAPLAMBRATIO No results found for: IGGSERUM, IGA, IGMSERUM No results found for: Kathrynn Ducking, MSPIKE, SPEI   Chemistry      Component Value Date/Time   NA 135 12/27/2019 1150   NA 135 12/11/2019 1056   NA 138  01/21/2017 1021   K 4.5 12/27/2019 1150   K 5.1 01/21/2017 1021   CL 101 12/27/2019 1150   CO2 28 12/27/2019 1150   CO2 30 (H) 01/21/2017 1021   BUN 36 (H) 12/27/2019 1150   BUN 36 (H) 12/11/2019 1056   BUN 30.0 (H) 01/21/2017 1021   CREATININE 1.55 (H) 12/27/2019 1150   CREATININE 1.8 (H) 01/21/2017 1021  Component Value Date/Time   CALCIUM 10.8 (H) 12/27/2019 1150   CALCIUM 11.1 (H) 01/21/2017 1021   ALKPHOS 178 (H) 12/27/2019 1150   ALKPHOS 124 01/21/2017 1021   AST 22 12/27/2019 1150   AST 27 01/21/2017 1021   ALT 22 12/27/2019 1150   ALT 25 01/21/2017 1021   BILITOT 1.6 (H) 12/27/2019 1150   BILITOT 0.50 01/21/2017 1021       Impression and Plan: Ms. Meuser is a very pleasant 86 yo African American female with history of diffuse large cell non-Hodgkin lymphoma diagnosed and treated over 18 years ago. So far there has been no evidence of recurrence.   I really hope that her cardiac status can improve.  I know this is certainly the determinant of her overall prognosis.  Her birthday is coming up this Friday.  She will be 86 years old.  I really hope that she gets to enjoy her birthday.  I will plan to see her back in another 6 months.  If there are any problems between now and then, I am sure that she will let us know.      Volanda Napoleon, MD 2/10/20211:01 PM

## 2019-12-27 NOTE — Telephone Encounter (Signed)
Appointments scheduled calendar printed per 2/10 los

## 2019-12-28 DIAGNOSIS — I130 Hypertensive heart and chronic kidney disease with heart failure and stage 1 through stage 4 chronic kidney disease, or unspecified chronic kidney disease: Secondary | ICD-10-CM | POA: Diagnosis not present

## 2019-12-28 DIAGNOSIS — Z7951 Long term (current) use of inhaled steroids: Secondary | ICD-10-CM | POA: Diagnosis not present

## 2019-12-28 DIAGNOSIS — I5042 Chronic combined systolic (congestive) and diastolic (congestive) heart failure: Secondary | ICD-10-CM | POA: Diagnosis not present

## 2019-12-28 DIAGNOSIS — N1831 Chronic kidney disease, stage 3a: Secondary | ICD-10-CM | POA: Diagnosis not present

## 2019-12-28 DIAGNOSIS — C8590 Non-Hodgkin lymphoma, unspecified, unspecified site: Secondary | ICD-10-CM | POA: Diagnosis not present

## 2019-12-28 DIAGNOSIS — E039 Hypothyroidism, unspecified: Secondary | ICD-10-CM | POA: Diagnosis not present

## 2019-12-28 LAB — FERRITIN: Ferritin: 153 ng/mL (ref 11–307)

## 2019-12-28 LAB — IRON AND TIBC
Iron: 65 ug/dL (ref 41–142)
Saturation Ratios: 18 % — ABNORMAL LOW (ref 21–57)
TIBC: 360 ug/dL (ref 236–444)
UIBC: 295 ug/dL (ref 120–384)

## 2020-01-02 DIAGNOSIS — I130 Hypertensive heart and chronic kidney disease with heart failure and stage 1 through stage 4 chronic kidney disease, or unspecified chronic kidney disease: Secondary | ICD-10-CM | POA: Diagnosis not present

## 2020-01-02 DIAGNOSIS — N1831 Chronic kidney disease, stage 3a: Secondary | ICD-10-CM | POA: Diagnosis not present

## 2020-01-02 DIAGNOSIS — Z9181 History of falling: Secondary | ICD-10-CM | POA: Diagnosis not present

## 2020-01-02 DIAGNOSIS — Z7951 Long term (current) use of inhaled steroids: Secondary | ICD-10-CM | POA: Diagnosis not present

## 2020-01-02 DIAGNOSIS — I5022 Chronic systolic (congestive) heart failure: Secondary | ICD-10-CM | POA: Diagnosis not present

## 2020-01-02 DIAGNOSIS — E039 Hypothyroidism, unspecified: Secondary | ICD-10-CM | POA: Diagnosis not present

## 2020-01-02 DIAGNOSIS — I5042 Chronic combined systolic (congestive) and diastolic (congestive) heart failure: Secondary | ICD-10-CM | POA: Diagnosis not present

## 2020-01-02 DIAGNOSIS — C8590 Non-Hodgkin lymphoma, unspecified, unspecified site: Secondary | ICD-10-CM | POA: Diagnosis not present

## 2020-01-05 DIAGNOSIS — C8590 Non-Hodgkin lymphoma, unspecified, unspecified site: Secondary | ICD-10-CM | POA: Diagnosis not present

## 2020-01-05 DIAGNOSIS — I5042 Chronic combined systolic (congestive) and diastolic (congestive) heart failure: Secondary | ICD-10-CM | POA: Diagnosis not present

## 2020-01-05 DIAGNOSIS — Z7951 Long term (current) use of inhaled steroids: Secondary | ICD-10-CM | POA: Diagnosis not present

## 2020-01-05 DIAGNOSIS — I130 Hypertensive heart and chronic kidney disease with heart failure and stage 1 through stage 4 chronic kidney disease, or unspecified chronic kidney disease: Secondary | ICD-10-CM | POA: Diagnosis not present

## 2020-01-05 DIAGNOSIS — N1831 Chronic kidney disease, stage 3a: Secondary | ICD-10-CM | POA: Diagnosis not present

## 2020-01-05 DIAGNOSIS — E039 Hypothyroidism, unspecified: Secondary | ICD-10-CM | POA: Diagnosis not present

## 2020-01-11 DIAGNOSIS — I130 Hypertensive heart and chronic kidney disease with heart failure and stage 1 through stage 4 chronic kidney disease, or unspecified chronic kidney disease: Secondary | ICD-10-CM | POA: Diagnosis not present

## 2020-01-11 DIAGNOSIS — Z7951 Long term (current) use of inhaled steroids: Secondary | ICD-10-CM | POA: Diagnosis not present

## 2020-01-11 DIAGNOSIS — N1831 Chronic kidney disease, stage 3a: Secondary | ICD-10-CM | POA: Diagnosis not present

## 2020-01-11 DIAGNOSIS — I5042 Chronic combined systolic (congestive) and diastolic (congestive) heart failure: Secondary | ICD-10-CM | POA: Diagnosis not present

## 2020-01-11 DIAGNOSIS — E039 Hypothyroidism, unspecified: Secondary | ICD-10-CM | POA: Diagnosis not present

## 2020-01-11 DIAGNOSIS — C8590 Non-Hodgkin lymphoma, unspecified, unspecified site: Secondary | ICD-10-CM | POA: Diagnosis not present

## 2020-01-14 ENCOUNTER — Other Ambulatory Visit: Payer: Self-pay | Admitting: Family Medicine

## 2020-01-19 ENCOUNTER — Telehealth: Payer: Self-pay | Admitting: Family Medicine

## 2020-01-19 NOTE — Telephone Encounter (Signed)
Patient's son  Hillery Aldo) [phone] hydrALAZINE (APRESOLINE) 10 MG tablet  Calling in to state patient is allergic to medicationgive by hospital while admitted to [facility] in January, patient stop taking medication two weeks , Patient son would like to know if there a alternative medication for patient please advise.    Patient states reaction to medication :  Itching and unable to sleep   Also please advise on continuing in home health care.   Please advise

## 2020-01-22 NOTE — Telephone Encounter (Signed)
Please place hydralazine in her allergy list. Can continue home health. She should do a virtual visit and make sure she can get a couple of blood pressure readings prior to the visit to discuss how she is doing and what to do next.

## 2020-01-22 NOTE — Telephone Encounter (Signed)
Please advise 

## 2020-01-23 NOTE — Telephone Encounter (Signed)
Please schedule patient a virtual visit with pcp

## 2020-01-23 NOTE — Progress Notes (Signed)
HPI: FU nonischemic cardiomyopathy. Cardiac catheterization in 2010 showed an ejection fraction of 15-20% and normal coronary arteries. Patient admitted with syncope previouslyfelt to potentially be vagally mediated. However given reduced LV function I discussed the possibility of of life vest or ICD. However she declined and preferred only medical therapy. The risk of sudden death was explained. Previously had mediastinoscopy for adenopathy and lung mass.  Pathology consistent with sarcoidosis. Patient followed by pulmonary for this issue and started on prednisone.  Admitted with CHF January 2021.  Treated with milrinone and Lasix with improvement.  Last echocardiogram January 2021 showed ejection fraction 30 to 23%, grade 2 diastolic dysfunction, reduced RV function, mild mitral regurgitation and biatrial enlargement.  Follow-up limited exam showed no apical thrombus.  Since last seen,  she has had worsening dyspnea on exertion, orthopnea, bilateral lower extremity edema and weight gain.  She denies chest pain.  Current Outpatient Medications  Medication Sig Dispense Refill  . acetaminophen (TYLENOL) 325 MG tablet Take 2 tablets (650 mg total) by mouth every 6 (six) hours as needed for fever, headache, mild pain or moderate pain.    Marland Kitchen albuterol (VENTOLIN HFA) 108 (90 Base) MCG/ACT inhaler INHALE 1 TO 2 PUFFS BY MOUTH EVERY 4 HOURS AS NEEDED FOR WHEEZING FOR SHORTNESS OF BREATH 18 g 0  . ALPRAZolam (XANAX) 0.25 MG tablet Take 1 tablet by mouth twice daily as needed for anxiety 30 tablet 1  . benzonatate (TESSALON) 100 MG capsule TAKE 1 TO 2 CAPSULES BY MOUTH THREE TIMES DAILY AS NEEDED FOR COUGH 60 capsule 0  . carvedilol (COREG) 3.125 MG tablet Take 1 tablet (3.125 mg total) by mouth 2 (two) times daily. 180 tablet 0  . furosemide (LASIX) 40 MG tablet Take 1 tablet (40 mg total) by mouth daily. OR AS DIRECTED 45 tablet 3  . isosorbide mononitrate (IMDUR) 30 MG 24 hr tablet Take 0.5 tablets (15  mg total) by mouth daily. 20 tablet 3  . levothyroxine (SYNTHROID) 50 MCG tablet Take 1 tablet (50 mcg total) by mouth daily. 90 tablet 3  . Multiple Vitamins-Minerals (CENTRUM PO) Take 1 tablet by mouth daily.     . Omega-3 Fatty Acids (FISH OIL) 1000 MG CAPS Take 1,000 mg by mouth daily.    . potassium chloride SA (KLOR-CON) 20 MEQ tablet Take 1 tablet (20 mEq total) by mouth daily as needed. 10 tablet 1  . Probiotic Product (PROBIOTIC DAILY PO) Take 1 capsule by mouth daily.      No current facility-administered medications for this visit.     Past Medical History:  Diagnosis Date  . Anemia 08/21/2017  . Anxiety   . Cardiomyopathy    non ischemic NL cos on cath 08/2009, EF to 15-20%, Her follow up  2D echo  in 10/2009 showed impoved EFo 35-40%  . CHF (congestive heart failure) ([place])   . Cough   . Depression   . Dyspnea   . Edema of leg   . Full dentures   . Hypercalcemia 04/20/2017  . Hypertension   . Hypothyroid   . Lung mass   . Medicare annual wellness visit, subsequent 12/14/2014   Follows with Dr Marin Olp Follows with Dr Deatra Ina, gastroenterology, last colonoscopy in 2012, repeat in 2017 Last Pap 2011, always normal, no need for repeat Last MGM in 2011, no concerns, declines for now Follows with Dr Stanford Breed of cardiology   . Non Hodgkin's lymphoma ([place]) 11/17/1999   -Dr. Marin Olp  . Pleural  effusion   . Vitamin D deficiency 10/17/2015  . Wears glasses     Past Surgical History:  Procedure Laterality Date  . DILATION AND CURETTAGE OF UTERUS    . MEDIASTINOSCOPY N/A 05/24/2019   Procedure: MEDIASTINOSCOPY;  Surgeon: Melrose Nakayama, MD;  Location: [facility] OR;  Service: Thoracic;  Laterality: N/A;  . MULTIPLE TOOTH EXTRACTIONS    . NASAL POLYP EXCISION  1962  . TONSILLECTOMY  1952  . VIDEO BRONCHOSCOPY WITH ENDOBRONCHIAL NAVIGATION Right 06/29/2018   Procedure: VIDEO BRONCHOSCOPY WITH ENDOBRONCHIAL NAVIGATION;  Surgeon: Collene Gobble, MD;  Location: [place];  Service:  Thoracic;  Laterality: Right;  Marland Kitchen VIDEO BRONCHOSCOPY WITH ENDOBRONCHIAL NAVIGATION N/A 05/24/2019   Procedure: VIDEO BRONCHOSCOPY WITH ENDOBRONCHIAL NAVIGATION;  Surgeon: Melrose Nakayama, MD;  Location: [place];  Service: Thoracic;  Laterality: N/A;  . VIDEO BRONCHOSCOPY WITH ENDOBRONCHIAL ULTRASOUND Right 06/29/2018   Procedure: VIDEO BRONCHOSCOPY WITH ENDOBRONCHIAL ULTRASOUND;  Surgeon: Collene Gobble, MD;  Location: [place];  Service: Thoracic;  Laterality: Right;  Marland Kitchen VIDEO BRONCHOSCOPY WITH ENDOBRONCHIAL ULTRASOUND N/A 05/24/2019   Procedure: VIDEO BRONCHOSCOPY WITH ENDOBRONCHIAL ULTRASOUND;  Surgeon: Melrose Nakayama, MD;  Location: [facility] OR;  Service: Thoracic;  Laterality: N/A;    Social History   Socioeconomic History  . Marital status: Married    Spouse name: Jeneen Rinks  . Number of children: 5  . Years of education: Not on file  . Highest education level: Not on file  Occupational History  . Occupation: retired  Tobacco Use  . Smoking status: Never Smoker  . Smokeless tobacco: Never Used  Substance and Sexual Activity  . Alcohol use: Never    Alcohol/week: 0.0 standard drinks  . Drug use: Never  . Sexual activity: Yes    Comment: lives with husband, no dietary restrictions, minimizes dairy  Other Topics Concern  . Not on file  Social History Narrative   The patient is married ( husband Mell Mellott)  She just recently celebrated     her 52nd anniversary.  She has five children.  There is no active     tobacco or alcohol use history.          Smoking Status:  never   Caffeine use/day:  None   Does Patient Exercise:  yes   Social Determinants of Health   Financial Resource Strain:   . Difficulty of Paying Living Expenses:   Food Insecurity:   . Worried About Charity fundraiser in the Last Year:   . Arboriculturist in the Last Year:   Transportation Needs:   . Film/video editor (Medical):   Marland Kitchen Lack of Transportation (Non-Medical):   Physical Activity:   . Days of  Exercise per Week:   . Minutes of Exercise per Session:   Stress:   . Feeling of Stress :   Social Connections:   . Frequency of Communication with Friends and Family:   . Frequency of Social Gatherings with Friends and Family:   . Attends Religious Services:   . Active Member of Clubs or Organizations:   . Attends Archivist Meetings:   Marland Kitchen Marital Status:   Intimate Partner Violence:   . Fear of Current or Ex-Partner:   . Emotionally Abused:   Marland Kitchen Physically Abused:   . Sexually Abused:     Family History  Problem Relation Age of Onset  . Diabetes Mother        deceased secondary to diabetes  . Hypertension Mother   .  Vision loss Mother   . Pneumonia Father        died age 47 due to complications of pneumonia  . Colon polyps Father   . Liver disease Daughter   . Sarcoidosis Daughter   . Diabetes Sister   . Colon cancer Brother 66  . Cancer Brother   . Obesity Son   . Diabetes Brother   . Kidney disease Brother   . Diabetes Sister   . Sarcoidosis Sister   . Arthritis Sister   . Arthritis Sister   . Diabetes Sister   . Arthritis Sister   . Diabetes Sister   . Hypertension Daughter   . Hypertension Daughter   . Other Other        no obvious premature cardiovascular disease or familial cardiomyopathy is noted  . Alcohol abuse Neg Hx   . Heart disease Neg Hx     ROS: no fevers or chills, productive cough, hemoptysis, dysphasia, odynophagia, melena, hematochezia, dysuria, hematuria, rash, seizure activity, claudication. Remaining systems are negative.  Physical Exam: Well-developed well-nourished in no acute distress.  Skin is warm and dry.  HEENT is normal.  Neck is supple.  Positive JVD. Chest is clear to auscultation with normal expansion.  Cardiovascular exam is regular rate and rhythm.  Abdominal exam nontender or distended. No masses palpated. Extremities show 3+ edema. neuro grossly intact  Electrocardiogram today personally reviewed and shows  sinus rhythm with first-degree AV block, PVCs, left axis deviation and right bundle branch block.  Prior anterior infarct cannot be excluded.  A/P  1 acute on chronic chronic systolic congestive heart failure-patient has developed recurrent congestive heart failure and she is markedly volume overloaded on examination today.  She will require admission for IV diuretic.  We will begin Lasix 80 mg IV twice daily and increase as needed.  Follow renal function closely.  Note at time of last admission she was treated with milrinone with some degree of success and this may be required again.  2 nonischemic cardiomyopathy-patient had angioedema with ACE inhibitors previously.  We will resume hydralazine 10 mg 3 times daily and isosorbide 15 mg daily.  Hold carvedilol in setting of acute CHF.  Titrate medications as tolerated.  3 history of hypertension-patient's blood pressure borderline; we will follow and advance medications as tolerated.  4 chronic stage III kidney disease-follow renal function closely with diuresis.  5 sarcoidosis-per pulmonary  [name], MD

## 2020-01-24 NOTE — Telephone Encounter (Signed)
I got her on a Virtual for Tues. 01/30/20 Pt stated she will have her BP readings. And states her BP was been doing pretty good

## 2020-01-25 ENCOUNTER — Telehealth: Payer: Self-pay | Admitting: Family Medicine

## 2020-01-25 NOTE — Telephone Encounter (Signed)
Caller/Agency: Tony/[employer]  Callback Number: [phone] EXT [phone] [id number] a signed order for [name] certification and Plan of Care.   Crystal [name] states that her office sent order over on January 22,2021 and hasn't received it back.   Please advise

## 2020-01-26 NOTE — Telephone Encounter (Signed)
I do not remember seeing any paperwork for her, have either one of you seen it Please advise

## 2020-01-26 NOTE — Telephone Encounter (Signed)
I do not see any paperwork.  Contacted [employer] and they will resend over.

## 2020-01-29 NOTE — Telephone Encounter (Signed)
Paperwork received and has been faxed.

## 2020-01-30 ENCOUNTER — Ambulatory Visit (INDEPENDENT_AMBULATORY_CARE_PROVIDER_SITE_OTHER): Payer: Medicare Other | Admitting: Family Medicine

## 2020-01-30 ENCOUNTER — Other Ambulatory Visit: Payer: Self-pay

## 2020-01-30 DIAGNOSIS — E039 Hypothyroidism, unspecified: Secondary | ICD-10-CM | POA: Diagnosis not present

## 2020-01-30 DIAGNOSIS — I5023 Acute on chronic systolic (congestive) heart failure: Secondary | ICD-10-CM | POA: Diagnosis not present

## 2020-01-30 DIAGNOSIS — R609 Edema, unspecified: Secondary | ICD-10-CM | POA: Diagnosis not present

## 2020-01-30 DIAGNOSIS — R739 Hyperglycemia, unspecified: Secondary | ICD-10-CM

## 2020-01-30 DIAGNOSIS — N1831 Chronic kidney disease, stage 3a: Secondary | ICD-10-CM

## 2020-01-30 DIAGNOSIS — E559 Vitamin D deficiency, unspecified: Secondary | ICD-10-CM

## 2020-01-30 MED ORDER — POTASSIUM CHLORIDE CRYS ER 20 MEQ PO TBCR: 20.0000 meq | EXTENDED_RELEASE_TABLET | Freq: Every day | ORAL | 1 refills | Status: DC | PRN

## 2020-01-30 NOTE — Patient Instructions (Signed)
Omron Blood Pressure cuff, upper arm, want BP 100-140/60-90 Pulse oximeter, want oxygen in 90s  Weekly vitals  Take Multivitamin with minerals, selenium Vitamin D 1000-2000 IU daily Probiotic with lactobacillus and bifidophilus Asprin EC 81 mg daily  Melatonin 2-5 mg at bedtime  [email or web address] phone  Increase Lasix/Furosemide 40 mg tabs to twice daily x 3 days then drop back to 1 tab daily While taking 2 tabs daily need to take potassium 20 meq daily... You can do this any time your swelling gets worse or Shortness of breath gets worse or you gain > 3 # in 24 hours

## 2020-01-31 DIAGNOSIS — R609 Edema, unspecified: Secondary | ICD-10-CM | POA: Insufficient documentation

## 2020-01-31 NOTE — Assessment & Plan Note (Signed)
Encouraged elevation, minimize simple carbs and use compression hose. May take a second dose of Furosemide for 3 days as needed when edema get s excessive or she gains more than 3 # in 24 hours. If she does that she is instructed to take a potassium tab each of those days as well. prescripton sent to pharmacy

## 2020-01-31 NOTE — Progress Notes (Addendum)
Virtual Visit via phone Note  I connected with Crystal [name] on 01/30/20 at  4:00 PM EDT by a phone enabled telemedicine application and verified that I am speaking with the correct person using two identifiers.  Location: Patient: home Provider: office   I discussed the limitations of evaluation and management by telemedicine and the availability of in person appointments. The patient expressed understanding and agreed to proceed. Kem Boroughs, CMA was able to get the patient set up on a phone visit after being unable to set up a video visit   Subjective:    Patient ID: Crystal [name], female    DOB: October 28, 1934, 86 y.o.   MRN: [id number]  Chief Complaint  Patient presents with  . Follow-up  . hydralazine    wants to talk about should she be on it?    HPI Patient is in today for follow up on chronic medical concerns. No recent febrile illness or hospitalizations. She is noting persistnet trouble with shortness of breath most notably when she lies down and exerts herself. She is also noting fatigue and pedal edema has worsened some. She has a non productive cough at times as well but no fevers or chills. Denies CP/palp/HA/congestion/fevers/GI or GU c/o. Taking meds as prescribed  Past Medical History:  Diagnosis Date  . Anemia 08/21/2017  . Anxiety   . Cardiomyopathy    non ischemic NL cos on cath 08/2009, EF to 15-20%, Her follow up  2D echo  in 10/2009 showed impoved EFo 35-40%  . CHF (congestive heart failure) ([place])   . Cough   . Depression   . Dyspnea   . Edema of leg   . Full dentures   . Hypercalcemia 04/20/2017  . Hypertension   . Hypothyroid   . Lung mass   . Medicare annual wellness visit, subsequent 12/14/2014   Follows with Dr Marin Olp Follows with Dr Deatra Ina, gastroenterology, last colonoscopy in 2012, repeat in 2017 Last Pap 2011, always normal, no need for repeat Last MGM in 2011, no concerns, declines for now Follows with Dr Stanford Breed of cardiology   . Non  Hodgkin's lymphoma ([place]) 11/17/1999   -Dr. Marin Olp  . Pleural effusion   . Vitamin D deficiency 10/17/2015  . Wears glasses     Past Surgical History:  Procedure Laterality Date  . DILATION AND CURETTAGE OF UTERUS    . MEDIASTINOSCOPY N/A 05/24/2019   Procedure: MEDIASTINOSCOPY;  Surgeon: Melrose Nakayama, MD;  Location: [facility] OR;  Service: Thoracic;  Laterality: N/A;  . MULTIPLE TOOTH EXTRACTIONS    . NASAL POLYP EXCISION  1962  . TONSILLECTOMY  1952  . VIDEO BRONCHOSCOPY WITH ENDOBRONCHIAL NAVIGATION Right 06/29/2018   Procedure: VIDEO BRONCHOSCOPY WITH ENDOBRONCHIAL NAVIGATION;  Surgeon: Collene Gobble, MD;  Location: [place];  Service: Thoracic;  Laterality: Right;  Marland Kitchen VIDEO BRONCHOSCOPY WITH ENDOBRONCHIAL NAVIGATION N/A 05/24/2019   Procedure: VIDEO BRONCHOSCOPY WITH ENDOBRONCHIAL NAVIGATION;  Surgeon: Melrose Nakayama, MD;  Location: [place];  Service: Thoracic;  Laterality: N/A;  . VIDEO BRONCHOSCOPY WITH ENDOBRONCHIAL ULTRASOUND Right 06/29/2018   Procedure: VIDEO BRONCHOSCOPY WITH ENDOBRONCHIAL ULTRASOUND;  Surgeon: Collene Gobble, MD;  Location: [place];  Service: Thoracic;  Laterality: Right;  Marland Kitchen VIDEO BRONCHOSCOPY WITH ENDOBRONCHIAL ULTRASOUND N/A 05/24/2019   Procedure: VIDEO BRONCHOSCOPY WITH ENDOBRONCHIAL ULTRASOUND;  Surgeon: Melrose Nakayama, MD;  Location: [facility] OR;  Service: Thoracic;  Laterality: N/A;    Family History  Problem Relation Age of Onset  . Diabetes Mother  deceased secondary to diabetes  . Hypertension Mother   . Vision loss Mother   . Pneumonia Father        died age 91 due to complications of pneumonia  . Colon polyps Father   . Liver disease Daughter   . Sarcoidosis Daughter   . Diabetes Sister   . Colon cancer Brother 61  . Cancer Brother   . Obesity Son   . Diabetes Brother   . Kidney disease Brother   . Diabetes Sister   . Sarcoidosis Sister   . Arthritis Sister   . Arthritis Sister   . Diabetes Sister   . Arthritis Sister   .  Diabetes Sister   . Hypertension Daughter   . Hypertension Daughter   . Other Other        no obvious premature cardiovascular disease or familial cardiomyopathy is noted  . Alcohol abuse Neg Hx   . Heart disease Neg Hx     Social History   Socioeconomic History  . Marital status: Married    Spouse name: Jeneen Rinks  . Number of children: 5  . Years of education: Not on file  . Highest education level: Not on file  Occupational History  . Occupation: retired  Tobacco Use  . Smoking status: Never Smoker  . Smokeless tobacco: Never Used  Substance and Sexual Activity  . Alcohol use: Never    Alcohol/week: 0.0 standard drinks  . Drug use: Never  . Sexual activity: Yes    Comment: lives with husband, no dietary restrictions, minimizes dairy  Other Topics Concern  . Not on file  Social History Narrative   The patient is married ( husband [name])  She just recently celebrated     her 52nd anniversary.  She has five children.  There is no active     tobacco or alcohol use history.          Smoking Status:  never   Caffeine use/day:  None   Does Patient Exercise:  yes   Social Determinants of Health   Financial Resource Strain:   . Difficulty of Paying Living Expenses:   Food Insecurity:   . Worried About Charity fundraiser in the Last Year:   . Arboriculturist in the Last Year:   Transportation Needs:   . Film/video editor (Medical):   Marland Kitchen Lack of Transportation (Non-Medical):   Physical Activity:   . Days of Exercise per Week:   . Minutes of Exercise per Session:   Stress:   . Feeling of Stress :   Social Connections:   . Frequency of Communication with Friends and Family:   . Frequency of Social Gatherings with Friends and Family:   . Attends Religious Services:   . Active Member of Clubs or Organizations:   . Attends Archivist Meetings:   Marland Kitchen Marital Status:   Intimate Partner Violence:   . Fear of Current or Ex-Partner:   . Emotionally Abused:     Marland Kitchen Physically Abused:   . Sexually Abused:     Outpatient Medications Prior to Visit  Medication Sig Dispense Refill  . acetaminophen (TYLENOL) 325 MG tablet Take 2 tablets (650 mg total) by mouth every 6 (six) hours as needed for fever, headache, mild pain or moderate pain.    Marland Kitchen albuterol (VENTOLIN HFA) 108 (90 Base) MCG/ACT inhaler INHALE 1 TO 2 PUFFS BY MOUTH EVERY 4 HOURS AS NEEDED FOR WHEEZING FOR SHORTNESS OF BREATH 18 g  0  . ALPRAZolam (XANAX) 0.25 MG tablet Take 1 tablet by mouth twice daily as needed for anxiety 30 tablet 1  . benzonatate (TESSALON) 100 MG capsule TAKE 1 TO 2 CAPSULES BY MOUTH THREE TIMES DAILY AS NEEDED FOR COUGH 60 capsule 0  . carvedilol (COREG) 3.125 MG tablet Take 1 tablet (3.125 mg total) by mouth 2 (two) times daily. 180 tablet 0  . furosemide (LASIX) 40 MG tablet Take 1 tablet (40 mg total) by mouth daily. OR AS DIRECTED 45 tablet 3  . isosorbide mononitrate (IMDUR) 30 MG 24 hr tablet Take 0.5 tablets (15 mg total) by mouth daily. 20 tablet 3  . levothyroxine (SYNTHROID) 50 MCG tablet Take 1 tablet (50 mcg total) by mouth daily. 90 tablet 3  . Multiple Vitamins-Minerals (CENTRUM PO) Take 1 tablet by mouth daily.     . Omega-3 Fatty Acids (FISH OIL) 1000 MG CAPS Take 1,000 mg by mouth daily.    . Probiotic Product (PROBIOTIC DAILY PO) Take 1 capsule by mouth daily.     . hydrALAZINE (APRESOLINE) 10 MG tablet Take 1 tablet (10 mg total) by mouth every 8 (eight) hours. 90 tablet 3  . spironolactone (ALDACTONE) 25 MG tablet Take 0.5 tablets (12.5 mg total) by mouth daily. 20 tablet 3   No facility-administered medications prior to visit.    Allergies  Allergen Reactions  . Hydralazine Hcl Other (See Comments)    hypotension  . Lisinopril Swelling    Facial and lip swelling  . Iohexol Itching         Review of Systems  Constitutional: Positive for malaise/fatigue. Negative for fever.  HENT: Negative for congestion.   Eyes: Negative for blurred  vision.  Respiratory: Positive for shortness of breath.   Cardiovascular: Positive for leg swelling. Negative for chest pain and palpitations.  Gastrointestinal: Negative for abdominal pain, blood in stool and nausea.  Genitourinary: Negative for dysuria and frequency.  Musculoskeletal: Negative for falls.  Skin: Negative for rash.  Neurological: Negative for dizziness, loss of consciousness and headaches.  Endo/Heme/Allergies: Negative for environmental allergies.  Psychiatric/Behavioral: Negative for depression. The patient is not nervous/anxious.        Objective:    Physical Exam unable to obtain via phone visit  BP (!) 89/70   Pulse 81   Wt 125 lb (56.7 kg)   BMI 22.14 kg/m  Wt Readings from Last 3 Encounters:  01/30/20 125 lb (56.7 kg)  12/27/19 123 lb (55.8 kg)  12/11/19 114 lb 6.4 oz (51.9 kg)    Diabetic Foot Exam - Simple   No data filed     Lab Results  Component Value Date   WBC 3.6 (L) 12/27/2019   HGB 11.7 (L) 12/27/2019   HCT 36.4 12/27/2019   PLT 138 (L) 12/27/2019   GLUCOSE 125 (H) 12/27/2019   CHOL 136 05/29/2019   TRIG 65.0 05/29/2019   HDL 51.60 05/29/2019   LDLCALC 72 05/29/2019   ALT 22 12/27/2019   AST 22 12/27/2019   NA 135 12/27/2019   K 4.5 12/27/2019   CL 101 12/27/2019   CREATININE 1.55 (H) 12/27/2019   BUN 36 (H) 12/27/2019   CO2 28 12/27/2019   TSH 4.484 11/21/2019   INR 1.3 (H) 05/22/2019   HGBA1C 5.9 05/29/2019    Lab Results  Component Value Date   TSH 4.484 11/21/2019   Lab Results  Component Value Date   WBC 3.6 (L) 12/27/2019   HGB 11.7 (L) 12/27/2019  HCT 36.4 12/27/2019   MCV 91.7 12/27/2019   PLT 138 (L) 12/27/2019   Lab Results  Component Value Date   NA 135 12/27/2019   K 4.5 12/27/2019   CHLORIDE 101 01/21/2017   CO2 28 12/27/2019   GLUCOSE 125 (H) 12/27/2019   BUN 36 (H) 12/27/2019   CREATININE 1.55 (H) 12/27/2019   BILITOT 1.6 (H) 12/27/2019   ALKPHOS 178 (H) 12/27/2019   AST 22 12/27/2019    ALT 22 12/27/2019   PROT 9.0 (H) 12/27/2019   ALBUMIN 3.5 12/27/2019   CALCIUM 10.8 (H) 12/27/2019   ANIONGAP 6 12/27/2019   EGFR 29 (L) 01/21/2017   GFR 47.71 (L) 05/29/2019   Lab Results  Component Value Date   CHOL 136 05/29/2019   Lab Results  Component Value Date   HDL 51.60 05/29/2019   Lab Results  Component Value Date   LDLCALC 72 05/29/2019   Lab Results  Component Value Date   TRIG 65.0 05/29/2019   Lab Results  Component Value Date   CHOLHDL 3 05/29/2019   Lab Results  Component Value Date   HGBA1C 5.9 05/29/2019       Assessment & Plan:   Problem List Items Addressed This Visit    Hypothyroidism    On Levothyroxine, continue to monitor      Congestive heart failure ([place])    She notes persistent sob with exertion and lying down. If it worsens she can try taking an extra dose of Lasix daily for 3 days and she is to seek care if worsens dramatically or does not respond.       Hyperglycemia    hgba1c acceptable, minimize simple carbs. Increase exercise as tolerated.       Vitamin D deficiency    Supplement and monitor      CKD stage G3a/A1, GFR 45-59 and albumin creatinine ratio <30 mg/g    Hydrate and monitor      Edema    Encouraged elevation, minimize simple carbs and use compression hose. May take a second dose of Furosemide for 3 days as needed when edema get s excessive or she gains more than 3 # in 24 hours. If she does that she is instructed to take a potassium tab each of those days as well. prescripton sent to pharmacy         I have discontinued Adilyn E. Massimo's hydrALAZINE and spironolactone. I am also having her start on potassium chloride SA. Additionally, I am having her maintain her Multiple Vitamins-Minerals (CENTRUM PO), Probiotic Product (PROBIOTIC DAILY PO), Fish Oil, acetaminophen, levothyroxine, albuterol, furosemide, isosorbide mononitrate, carvedilol, ALPRAZolam, and benzonatate.  Meds ordered this encounter   Medications  . potassium chloride SA (KLOR-CON) 20 MEQ tablet    Sig: Take 1 tablet (20 mEq total) by mouth daily as needed.    Dispense:  10 tablet    Refill:  1     I discussed the assessment and treatment plan with the patient. The patient was provided an opportunity to ask questions and all were answered. The patient agreed with the plan and demonstrated an understanding of the instructions.   The patient was advised to call back or seek an in-person evaluation if the symptoms worsen or if the condition fails to improve as anticipated.  I provided 25 minutes of non-face-to-face time during this encounter.   Penni Homans, MD

## 2020-01-31 NOTE — Assessment & Plan Note (Signed)
Supplement and monitor 

## 2020-01-31 NOTE — Assessment & Plan Note (Signed)
She notes persistent sob with exertion and lying down. If it worsens she can try taking an extra dose of Lasix daily for 3 days and she is to seek care if worsens dramatically or does not respond.

## 2020-01-31 NOTE — Assessment & Plan Note (Signed)
On Levothyroxine, continue to monitor 

## 2020-01-31 NOTE — Assessment & Plan Note (Signed)
Hydrate and monitor 

## 2020-01-31 NOTE — Assessment & Plan Note (Signed)
hgba1c acceptable, minimize simple carbs. Increase exercise as tolerated.  

## 2020-02-05 ENCOUNTER — Encounter: Payer: Self-pay | Admitting: Cardiology

## 2020-02-05 ENCOUNTER — Other Ambulatory Visit: Payer: Self-pay

## 2020-02-05 ENCOUNTER — Emergency Department (HOSPITAL_COMMUNITY): Payer: Medicare Other

## 2020-02-05 ENCOUNTER — Inpatient Hospital Stay (HOSPITAL_COMMUNITY)
Admission: EM | Admit: 2020-02-05 | Discharge: 2020-02-14 | DRG: 291 | Disposition: A | Discharge status: Hospice | Payer: Medicare Other | Attending: Cardiology | Admitting: Cardiology

## 2020-02-05 ENCOUNTER — Ambulatory Visit (INDEPENDENT_AMBULATORY_CARE_PROVIDER_SITE_OTHER): Payer: Medicare Other | Admitting: Cardiology

## 2020-02-05 VITALS — BP 112/62 | HR 64 | Ht 63.0 in | Wt 143.0 lb

## 2020-02-05 DIAGNOSIS — D860 Sarcoidosis of lung: Secondary | ICD-10-CM | POA: Diagnosis not present

## 2020-02-05 DIAGNOSIS — E871 Hypo-osmolality and hyponatremia: Secondary | ICD-10-CM | POA: Diagnosis not present

## 2020-02-05 DIAGNOSIS — E039 Hypothyroidism, unspecified: Secondary | ICD-10-CM | POA: Diagnosis present

## 2020-02-05 DIAGNOSIS — I4510 Unspecified right bundle-branch block: Secondary | ICD-10-CM | POA: Diagnosis present

## 2020-02-05 DIAGNOSIS — I493 Ventricular premature depolarization: Secondary | ICD-10-CM | POA: Diagnosis present

## 2020-02-05 DIAGNOSIS — Z8572 Personal history of non-Hodgkin lymphomas: Secondary | ICD-10-CM | POA: Diagnosis not present

## 2020-02-05 DIAGNOSIS — I5043 Acute on chronic combined systolic (congestive) and diastolic (congestive) heart failure: Secondary | ICD-10-CM | POA: Diagnosis present

## 2020-02-05 DIAGNOSIS — I130 Hypertensive heart and chronic kidney disease with heart failure and stage 1 through stage 4 chronic kidney disease, or unspecified chronic kidney disease: Principal | ICD-10-CM | POA: Diagnosis present

## 2020-02-05 DIAGNOSIS — Z800 Family history of malignant neoplasm of digestive organs: Secondary | ICD-10-CM | POA: Diagnosis not present

## 2020-02-05 DIAGNOSIS — Z8371 Family history of colonic polyps: Secondary | ICD-10-CM

## 2020-02-05 DIAGNOSIS — D649 Anemia, unspecified: Secondary | ICD-10-CM | POA: Diagnosis not present

## 2020-02-05 DIAGNOSIS — Z833 Family history of diabetes mellitus: Secondary | ICD-10-CM | POA: Diagnosis not present

## 2020-02-05 DIAGNOSIS — Z841 Family history of disorders of kidney and ureter: Secondary | ICD-10-CM

## 2020-02-05 DIAGNOSIS — N179 Acute kidney failure, unspecified: Secondary | ICD-10-CM | POA: Diagnosis not present

## 2020-02-05 DIAGNOSIS — I428 Other cardiomyopathies: Secondary | ICD-10-CM

## 2020-02-05 DIAGNOSIS — R570 Cardiogenic shock: Secondary | ICD-10-CM | POA: Diagnosis present

## 2020-02-05 DIAGNOSIS — I10 Essential (primary) hypertension: Secondary | ICD-10-CM

## 2020-02-05 DIAGNOSIS — Z20822 Contact with and (suspected) exposure to covid-19: Secondary | ICD-10-CM | POA: Diagnosis present

## 2020-02-05 DIAGNOSIS — Z923 Personal history of irradiation: Secondary | ICD-10-CM

## 2020-02-05 DIAGNOSIS — I5084 End stage heart failure: Secondary | ICD-10-CM | POA: Diagnosis present

## 2020-02-05 DIAGNOSIS — Z515 Encounter for palliative care: Secondary | ICD-10-CM | POA: Diagnosis not present

## 2020-02-05 DIAGNOSIS — I5040 Unspecified combined systolic (congestive) and diastolic (congestive) heart failure: Secondary | ICD-10-CM | POA: Diagnosis not present

## 2020-02-05 DIAGNOSIS — Z888 Allergy status to other drugs, medicaments and biological substances status: Secondary | ICD-10-CM | POA: Diagnosis not present

## 2020-02-05 DIAGNOSIS — F419 Anxiety disorder, unspecified: Secondary | ICD-10-CM | POA: Diagnosis present

## 2020-02-05 DIAGNOSIS — N183 Chronic kidney disease, stage 3 unspecified: Secondary | ICD-10-CM | POA: Diagnosis not present

## 2020-02-05 DIAGNOSIS — I5023 Acute on chronic systolic (congestive) heart failure: Secondary | ICD-10-CM | POA: Diagnosis not present

## 2020-02-05 DIAGNOSIS — Z66 Do not resuscitate: Secondary | ICD-10-CM | POA: Diagnosis not present

## 2020-02-05 DIAGNOSIS — I509 Heart failure, unspecified: Secondary | ICD-10-CM

## 2020-02-05 DIAGNOSIS — Z8261 Family history of arthritis: Secondary | ICD-10-CM

## 2020-02-05 DIAGNOSIS — E559 Vitamin D deficiency, unspecified: Secondary | ICD-10-CM | POA: Diagnosis not present

## 2020-02-05 DIAGNOSIS — R600 Localized edema: Secondary | ICD-10-CM | POA: Diagnosis not present

## 2020-02-05 DIAGNOSIS — E876 Hypokalemia: Secondary | ICD-10-CM | POA: Diagnosis not present

## 2020-02-05 DIAGNOSIS — I7300 Raynaud's syndrome without gangrene: Secondary | ICD-10-CM | POA: Diagnosis present

## 2020-02-05 DIAGNOSIS — R0602 Shortness of breath: Secondary | ICD-10-CM | POA: Diagnosis not present

## 2020-02-05 DIAGNOSIS — J90 Pleural effusion, not elsewhere classified: Secondary | ICD-10-CM | POA: Diagnosis not present

## 2020-02-05 DIAGNOSIS — Z91041 Radiographic dye allergy status: Secondary | ICD-10-CM

## 2020-02-05 DIAGNOSIS — Z8249 Family history of ischemic heart disease and other diseases of the circulatory system: Secondary | ICD-10-CM | POA: Diagnosis not present

## 2020-02-05 DIAGNOSIS — Z821 Family history of blindness and visual loss: Secondary | ICD-10-CM

## 2020-02-05 DIAGNOSIS — E870 Hyperosmolality and hypernatremia: Secondary | ICD-10-CM | POA: Diagnosis not present

## 2020-02-05 DIAGNOSIS — N1831 Chronic kidney disease, stage 3a: Secondary | ICD-10-CM | POA: Diagnosis present

## 2020-02-05 DIAGNOSIS — Z9221 Personal history of antineoplastic chemotherapy: Secondary | ICD-10-CM

## 2020-02-05 DIAGNOSIS — I110 Hypertensive heart disease with heart failure: Secondary | ICD-10-CM | POA: Diagnosis not present

## 2020-02-05 DIAGNOSIS — I5082 Biventricular heart failure: Secondary | ICD-10-CM | POA: Diagnosis present

## 2020-02-05 LAB — CBC
HCT: 38.3 % (ref 36.0–46.0)
Hemoglobin: 12.4 g/dL (ref 12.0–15.0)
MCH: 30.1 pg (ref 26.0–34.0)
MCHC: 32.4 g/dL (ref 30.0–36.0)
MCV: 93 fL (ref 80.0–100.0)
Platelets: 111 10*3/uL — ABNORMAL LOW (ref 150–400)
RBC: 4.12 MIL/uL (ref 3.87–5.11)
RDW: 16.5 % — ABNORMAL HIGH (ref 11.5–15.5)
WBC: 3 10*3/uL — ABNORMAL LOW (ref 4.0–10.5)
nRBC: 0 % (ref 0.0–0.2)

## 2020-02-05 LAB — BASIC METABOLIC PANEL
Anion gap: 12 (ref 5–15)
BUN: 60 mg/dL — ABNORMAL HIGH (ref 8–23)
CO2: 21 mmol/L — ABNORMAL LOW (ref 22–32)
Calcium: 9.6 mg/dL (ref 8.9–10.3)
Chloride: 101 mmol/L (ref 98–111)
Creatinine, Ser: 1.69 mg/dL — ABNORMAL HIGH (ref 0.44–1.00)
GFR calc Af Amer: 32 mL/min — ABNORMAL LOW (ref >60)
GFR calc non Af Amer: 27 mL/min — ABNORMAL LOW (ref >60)
Glucose, Bld: 133 mg/dL — ABNORMAL HIGH (ref 70–99)
Potassium: 4.7 mmol/L (ref 3.5–5.1)
Sodium: 134 mmol/L — ABNORMAL LOW (ref 135–145)

## 2020-02-05 LAB — SARS CORONAVIRUS 2 (TAT 6-24 HRS): SARS Coronavirus 2: NEGATIVE

## 2020-02-05 LAB — TROPONIN I (HIGH SENSITIVITY)
Troponin I (High Sensitivity): 14 ng/L (ref <18)
Troponin I (High Sensitivity): 13 ng/L (ref <18)

## 2020-02-05 LAB — BRAIN NATRIURETIC PEPTIDE: B Natriuretic Peptide: 3829.9 pg/mL — ABNORMAL HIGH (ref 0.0–100.0)

## 2020-02-05 MED ORDER — SODIUM CHLORIDE 0.9% FLUSH: 3.0000 mL | Freq: Once | INTRAVENOUS | Status: DC

## 2020-02-05 MED ORDER — ALPRAZOLAM 0.25 MG PO TABS
0.2500 mg | ORAL_TABLET | Freq: Two times a day (BID) | ORAL | Status: DC | PRN
  Administered 2020-02-05 – 2020-02-10 (×7): 0.25 mg via ORAL
  Filled 2020-02-05 (×7): qty 1

## 2020-02-05 MED ORDER — LEVOTHYROXINE SODIUM 50 MCG PO TABS
50.0000 ug | ORAL_TABLET | Freq: Every day | ORAL | Status: DC
  Administered 2020-02-06 – 2020-02-14 (×9): 50 ug via ORAL
  Filled 2020-02-05 (×9): qty 1

## 2020-02-05 MED ORDER — LEVOTHYROXINE SODIUM 50 MCG PO TABS: 50.0000 ug | ORAL_TABLET | Freq: Every day | ORAL | Status: DC

## 2020-02-05 MED ORDER — FUROSEMIDE 10 MG/ML IJ SOLN
80.0000 mg | Freq: Two times a day (BID) | INTRAMUSCULAR | Status: DC
  Administered 2020-02-05 – 2020-02-06 (×3): 80 mg via INTRAVENOUS
  Filled 2020-02-05 (×3): qty 8

## 2020-02-05 MED ORDER — ACETAMINOPHEN 325 MG PO TABS
650.0000 mg | ORAL_TABLET | ORAL | Status: DC | PRN
  Administered 2020-02-10: 650 mg via ORAL
  Filled 2020-02-05: qty 2

## 2020-02-05 MED ORDER — SODIUM CHLORIDE 0.9% FLUSH
3.0000 mL | Freq: Two times a day (BID) | INTRAVENOUS | Status: DC
  Administered 2020-02-05 – 2020-02-11 (×9): 3 mL via INTRAVENOUS

## 2020-02-05 MED ORDER — ALBUTEROL SULFATE HFA 108 (90 BASE) MCG/ACT IN AERS
2.0000 | INHALATION_SPRAY | RESPIRATORY_TRACT | Status: DC | PRN
  Administered 2020-02-05: 23:00:00 2 via RESPIRATORY_TRACT
  Filled 2020-02-05: qty 6.7

## 2020-02-05 MED ORDER — ONDANSETRON HCL 4 MG/2ML IJ SOLN: 4.0000 mg | Freq: Four times a day (QID) | INTRAMUSCULAR | Status: DC | PRN

## 2020-02-05 MED ORDER — ISOSORBIDE MONONITRATE ER 30 MG PO TB24
15.0000 mg | ORAL_TABLET | Freq: Every day | ORAL | Status: DC
  Administered 2020-02-06 – 2020-02-07 (×2): 15 mg via ORAL
  Filled 2020-02-05 (×2): qty 1

## 2020-02-05 MED ORDER — GUAIFENESIN-DM 100-10 MG/5ML PO SYRP
5.0000 mL | ORAL_SOLUTION | ORAL | Status: DC | PRN
  Administered 2020-02-06 – 2020-02-11 (×8): 5 mL via ORAL
  Filled 2020-02-05 (×8): qty 5

## 2020-02-05 MED ORDER — SODIUM CHLORIDE 0.9 % IV SOLN
250.0000 mL | INTRAVENOUS | Status: DC | PRN
  Administered 2020-02-10: 250 mL via INTRAVENOUS

## 2020-02-05 MED ORDER — SODIUM CHLORIDE 0.9% FLUSH: 3.0000 mL | INTRAVENOUS | Status: DC | PRN

## 2020-02-05 MED ORDER — HEPARIN SODIUM (PORCINE) 5000 UNIT/ML IJ SOLN
5000.0000 [IU] | Freq: Three times a day (TID) | INTRAMUSCULAR | Status: DC
  Administered 2020-02-05 – 2020-02-14 (×26): 5000 [IU] via SUBCUTANEOUS
  Filled 2020-02-05 (×25): qty 1

## 2020-02-05 NOTE — ED Notes (Signed)
RN attempted report 

## 2020-02-05 NOTE — H&P (Signed)
Cardiology Admission History and Physical:   Patient ID: Crystal [name] MRN: [id number]; DOB: 06-08-1934   Admission date: 02/05/2020  Primary Care Provider: Mosie Lukes, MD Primary Cardiologist: [name], MD  Primary Electrophysiologist:  None   Chief Complaint:  SOB and LE edema  Patient Profile:   Crystal [name] is a 86 y.o. female with with PMH of non-ischemic cardiomyopathy, HTN, hypothyroidism, sarcoidosis, and anxiety, who presents for management of her CHF.  History of Present Illness:   Crystal [name] presented outpatient 02/05/20 for FU nonischemic cardiomyopathy. Cardiac catheterization in 2010 showed an ejection fraction of 15-20% and normal coronary arteries. Patient admitted with syncope previouslyfelt to potentially be vagally mediated. However given reduced LV function I discussed the possibility of of life vest or ICD. However she declined and preferred only medical therapy. The risk of sudden death was explained. Previously had mediastinoscopy for adenopathy and lung mass. Pathology consistent with sarcoidosis.Patient followed by pulmonary for this issue and started on prednisone.  Admitted with CHF January 2021.  Treated with milrinone and Lasix with improvement.  Last echocardiogram January 2021 showed ejection fraction 30 to 93%, grade 2 diastolic dysfunction, reduced RV function, mild mitral regurgitation and biatrial enlargement.  Follow-up limited exam showed no apical thrombus.  Since last seen, she has had worsening dyspnea on exertion, orthopnea, bilateral lower extremity edema and weight gain.  She denies chest pain.   Past Medical History:  Diagnosis Date  . Anemia 08/21/2017  . Anxiety   . Cardiomyopathy    non ischemic NL cos on cath 08/2009, EF to 15-20%, Her follow up  2D echo  in 10/2009 showed impoved EFo 35-40%  . CHF (congestive heart failure) ([place])   . Cough   . Depression   . Dyspnea   . Edema of leg   . Full dentures   . Hypercalcemia  04/20/2017  . Hypertension   . Hypothyroid   . Lung mass   . Medicare annual wellness visit, subsequent 12/14/2014   Follows with Dr Marin Olp Follows with Dr Deatra Ina, gastroenterology, last colonoscopy in 2012, repeat in 2017 Last Pap 2011, always normal, no need for repeat Last MGM in 2011, no concerns, declines for now Follows with Dr Stanford Breed of cardiology   . Non Hodgkin's lymphoma ([place]) 11/17/1999   -Dr. Marin Olp  . Pleural effusion   . Vitamin D deficiency 10/17/2015  . Wears glasses     Past Surgical History:  Procedure Laterality Date  . DILATION AND CURETTAGE OF UTERUS    . MEDIASTINOSCOPY N/A 05/24/2019   Procedure: MEDIASTINOSCOPY;  Surgeon: Melrose Nakayama, MD;  Location: [facility] OR;  Service: Thoracic;  Laterality: N/A;  . MULTIPLE TOOTH EXTRACTIONS    . NASAL POLYP EXCISION  1962  . TONSILLECTOMY  1952  . VIDEO BRONCHOSCOPY WITH ENDOBRONCHIAL NAVIGATION Right 06/29/2018   Procedure: VIDEO BRONCHOSCOPY WITH ENDOBRONCHIAL NAVIGATION;  Surgeon: Collene Gobble, MD;  Location: [place];  Service: Thoracic;  Laterality: Right;  Marland Kitchen VIDEO BRONCHOSCOPY WITH ENDOBRONCHIAL NAVIGATION N/A 05/24/2019   Procedure: VIDEO BRONCHOSCOPY WITH ENDOBRONCHIAL NAVIGATION;  Surgeon: Melrose Nakayama, MD;  Location: [place];  Service: Thoracic;  Laterality: N/A;  . VIDEO BRONCHOSCOPY WITH ENDOBRONCHIAL ULTRASOUND Right 06/29/2018   Procedure: VIDEO BRONCHOSCOPY WITH ENDOBRONCHIAL ULTRASOUND;  Surgeon: Collene Gobble, MD;  Location: [place];  Service: Thoracic;  Laterality: Right;  Marland Kitchen VIDEO BRONCHOSCOPY WITH ENDOBRONCHIAL ULTRASOUND N/A 05/24/2019   Procedure: VIDEO BRONCHOSCOPY WITH ENDOBRONCHIAL ULTRASOUND;  Surgeon: Melrose Nakayama, MD;  Location:  [facility] OR;  Service: Thoracic;  Laterality: N/A;     Medications Prior to Admission: Prior to Admission medications   Medication Sig Start Date End Date Taking? Authorizing Provider  acetaminophen (TYLENOL) 325 MG tablet Take 2 tablets (650 mg total) by mouth  every 6 (six) hours as needed for fever, headache, mild pain or moderate pain. 05/25/19   Nani Skillern, PA-C  albuterol (VENTOLIN HFA) 108 (90 Base) MCG/ACT inhaler INHALE 1 TO 2 PUFFS BY MOUTH EVERY 4 HOURS AS NEEDED FOR WHEEZING FOR SHORTNESS OF BREATH 11/02/19   Mosie Lukes, MD  ALPRAZolam Duanne Moron) 0.25 MG tablet Take 1 tablet by mouth twice daily as needed for anxiety 12/12/19   Mosie Lukes, MD  benzonatate (TESSALON) 100 MG capsule TAKE 1 TO 2 CAPSULES BY MOUTH THREE TIMES DAILY AS NEEDED FOR COUGH 01/15/20   Mosie Lukes, MD  carvedilol (COREG) 3.125 MG tablet Take 1 tablet (3.125 mg total) by mouth 2 (two) times daily. 12/11/19   Verta Ellen., NP  furosemide (LASIX) 40 MG tablet Take 1 tablet (40 mg total) by mouth daily. OR AS DIRECTED 11/30/19 02/28/20  Barrett, Evelene Croon, PA-C  isosorbide mononitrate (IMDUR) 30 MG 24 hr tablet Take 0.5 tablets (15 mg total) by mouth daily. 12/01/19   Barrett, Evelene Croon, PA-C  levothyroxine (SYNTHROID) 50 MCG tablet Take 1 tablet (50 mcg total) by mouth daily. 05/31/19   Mosie Lukes, MD  Multiple Vitamins-Minerals (CENTRUM PO) Take 1 tablet by mouth daily.     [provider]  Omega-3 Fatty Acids (FISH OIL) 1000 MG CAPS Take 1,000 mg by mouth daily.    [provider]  potassium chloride SA (KLOR-CON) 20 MEQ tablet Take 1 tablet (20 mEq total) by mouth daily as needed. 01/30/20   Mosie Lukes, MD  Probiotic Product (PROBIOTIC DAILY PO) Take 1 capsule by mouth daily.     [provider]     Allergies:    Allergies  Allergen Reactions  . Hydralazine Hcl Other (See Comments)    hypotension  . Lisinopril Swelling    Facial and lip swelling  . Iohexol Itching         Social History:   Social History   Socioeconomic History  . Marital status: Married    Spouse name: Jeneen Rinks  . Number of children: 5  . Years of education: Not on file  . Highest education level: Not on file  Occupational History  .  Occupation: retired  Tobacco Use  . Smoking status: Never Smoker  . Smokeless tobacco: Never Used  Substance and Sexual Activity  . Alcohol use: Never    Alcohol/week: 0.0 standard drinks  . Drug use: Never  . Sexual activity: Yes    Comment: lives with husband, no dietary restrictions, minimizes dairy  Other Topics Concern  . Not on file  Social History Narrative   The patient is married ( husband Axie Hayne)  She just recently celebrated     her 52nd anniversary.  She has five children.  There is no active     tobacco or alcohol use history.          Smoking Status:  never   Caffeine use/day:  None   Does Patient Exercise:  yes   Social Determinants of Health   Financial Resource Strain:   . Difficulty of Paying Living Expenses:   Food Insecurity:   . Worried About Charity fundraiser in the Last  Year:   . Ran Out of Food in the Last Year:   Transportation Needs:   . Film/video editor (Medical):   Marland Kitchen Lack of Transportation (Non-Medical):   Physical Activity:   . Days of Exercise per Week:   . Minutes of Exercise per Session:   Stress:   . Feeling of Stress :   Social Connections:   . Frequency of Communication with Friends and Family:   . Frequency of Social Gatherings with Friends and Family:   . Attends Religious Services:   . Active Member of Clubs or Organizations:   . Attends Archivist Meetings:   Marland Kitchen Marital Status:   Intimate Partner Violence:   . Fear of Current or Ex-Partner:   . Emotionally Abused:   Marland Kitchen Physically Abused:   . Sexually Abused:     Family History:   The patient's family history includes Arthritis in her sister, sister, and sister; Cancer in her brother; Colon cancer (age of onset: 7) in her brother; Colon polyps in her father; Diabetes in her brother, mother, sister, sister, sister, and sister; Hypertension in her daughter, daughter, and mother; Kidney disease in her brother; Liver disease in her daughter; Obesity in her son;  Other in an other family member; Pneumonia in her father; Sarcoidosis in her daughter and sister; Vision loss in her mother. There is no history of Alcohol abuse or Heart disease.    ROS:  Please see the history of present illness.  All other ROS reviewed and negative.     Physical Exam/Data:   Vitals:   02/05/20 1315 02/05/20 1351 02/05/20 1351 02/05/20 1400  BP: 111/80   102/81  Pulse: 69   64  Resp: (!) 21   (!) 24  SpO2: 100%  100% 95%  Weight:  64.9 kg    Height:  5\' 3"  (1.6 m)     No intake or output data in the 24 hours ending 02/05/20 1424 Last 3 Weights 02/05/2020 02/05/2020 01/30/2020  Weight (lbs) 143 lb 143 lb 125 lb  Weight (kg) 64.864 kg 64.864 kg 56.7 kg     Body mass index is 25.33 kg/m.  Well-developed well-nourished in no acute distress.  Skin is warm and dry.  HEENT is normal.  Neck is supple.  Positive JVD. Chest is clear to auscultation with normal expansion.  Cardiovascular exam is regular rate and rhythm.  Abdominal exam nontender or distended. No masses palpated. Extremities show 3+ edema. neuro grossly intact   EKG:  The ECG that was done in office 02/05/20 was personally reviewed by Dr. Stanford Breed and demonstrates sinus rhythm with first-degree AV block, PVCs, left axis deviation and right bundle branch block.  Prior anterior infarct cannot be excluded  Relevant CV Studies:  Limited echo 11/24/19 1. Limited exam with contrast to rule out apical thrombus  2. No apical thrombus seen  3. Left ventricular ejection fraction, by visual estimation, is 25 to  30%. The left ventricle has severely decreased function. There is no  increased left ventricular wall thickness.   Echo 11/22/19: 1. Left ventricular ejection fraction, by visual estimation, is 30 to  35%. The left ventricle has moderate to severely decreased function. There  is no left ventricular hypertrophy.  2. The left ventricle demonstrates global hypokinesis with  inferior/inferoseptal  akinesis. Apex with significant trabeculations, no  clear thrombus seen but would consider limited TTE with contrast to rule  out apical thrombus  3. Left ventricular diastolic parameters are consistent with Grade  II  diastolic dysfunction (pseudonormalization).  4. Elevated left atrial pressure.  5. Global right ventricle has moderately reduced systolic function.The  right ventricular size is mildly enlarged.  6. Moderate pleural effusion in the left lateral region.  7. The mitral valve is normal in structure. Mild mitral valve  regurgitation.  8. The tricuspid valve is normal in structure.  9. The aortic valve is abnormal. Moderately thickened leaflets. Aortic  valve regurgitation is not visualized. Mild to moderate aortic valve  sclerosis/calcification without any evidence of aortic stenosis.  10. The pulmonic valve was grossly normal. Pulmonic valve regurgitation is  trivial.  11. Left atrial size was mildly dilated.  12. Right atrial size was moderately dilated.  13. The inferior vena cava is normal in size with <50% respiratory  variability, suggesting right atrial pressure of 8 mmHg.  14. The tricuspid regurgitant velocity is 2.81 m/s, and with an assumed  right atrial pressure of 8 mmHg, the estimated right ventricular systolic  pressure is mildly elevated at 39.6 mmHg.   Laboratory Data:  High Sensitivity Troponin:   Recent Labs  Lab 02/05/20 1221  TROPONINIHS 14      Chemistry Recent Labs  Lab 02/05/20 1221  NA 134*  K 4.7  CL 101  CO2 21*  GLUCOSE 133*  BUN 60*  CREATININE 1.69*  CALCIUM 9.6  GFRNONAA 27*  GFRAA 32*  ANIONGAP 12    No results for input(s): PROT, ALBUMIN, AST, ALT, ALKPHOS, BILITOT in the last 168 hours. Hematology Recent Labs  Lab 02/05/20 1221  WBC 3.0*  RBC 4.12  HGB 12.4  HCT 38.3  MCV 93.0  MCH 30.1  MCHC 32.4  RDW 16.5*  PLT 111*   BNPNo results for input(s): BNP, PROBNP in the last 168 hours.  DDimer No results  for input(s): DDIMER in the last 168 hours.   Radiology/Studies:  DG Chest 2 View  Result Date: 02/05/2020 CLINICAL DATA:  Shortness of breath. EXAM: CHEST - 2 VIEW COMPARISON:  November 27, 2019. FINDINGS: Stable cardiomegaly. No pneumothorax is noted. Hypoinflation of the lungs is noted. Bilateral perihilar and basilar atelectasis or edema is noted with small bilateral pleural effusions. Bony thorax is unremarkable. Atherosclerosis of thoracic aorta. IMPRESSION: Hypoinflation of the lungs with bilateral perihilar and basilar atelectasis or edema. Small bilateral pleural effusions are noted. Aortic Atherosclerosis (ICD10-I70.0). Electronically Signed   By: Marijo Conception M.D.   On: 02/05/2020 12:53   { Assessment and Plan:   1 acute on chronic chronic systolic congestive heart failure-patient has developed recurrent congestive heart failure and she is markedly volume overloaded on examination today.  She will require admission for IV diuretic.  We will begin Lasix 80 mg IV twice daily and increase as needed.  Follow renal function closely.  Note at time of last admission she was treated with milrinone with some degree of success and this may be required again.  2 nonischemic cardiomyopathy-patient had angioedema with ACE inhibitors previously.  We will resume hydralazine 10 mg 3 times daily and isosorbide 15 mg daily.  Hold carvedilol in setting of acute CHF.  Titrate medications as tolerated.  3 history of hypertension-patient's blood pressure borderline; we will follow and advance medications as tolerated.  4 chronic stage III kidney disease-follow renal function closely with diuresis.  5 sarcoidosis-per pulmonary  Severity of Illness: The appropriate patient status for this patient is INPATIENT. Inpatient status is judged to be reasonable and necessary in order to provide the required intensity of  service to ensure the patient's safety. The patient's presenting symptoms, physical exam  findings, and initial radiographic and laboratory data in the context of their chronic comorbidities is felt to place them at high risk for further clinical deterioration. Furthermore, it is not anticipated that the patient will be medically stable for discharge from the hospital within 2 midnights of admission. The following factors support the patient status of inpatient.   " The patient's presenting symptoms include SOB, orthopnea, PND, weight gain. " The worrisome physical exam findings include JVD, 3+ LE edema. " The initial radiographic and laboratory data are worrisome because of small bilateral pleural effusions on CXR. " The chronic co-morbidities include non-ischemic cardiomyopathy, HTN, sarcoidosis.   * I certify that at the point of admission it is my clinical judgment that the patient will require inpatient hospital care spanning beyond 2 midnights from the point of admission due to high intensity of service, high risk for further deterioration and high frequency of surveillance required.*    For questions or updates, please contact [place] Please consult [email or web address] for contact info under   Above note obtained from Dr. Stanford Breed 02/05/20

## 2020-02-05 NOTE — Plan of Care (Signed)

## 2020-02-05 NOTE — ED Provider Notes (Signed)
[facility] EMERGENCY DEPARTMENT Provider Note   CSN: [id number] Arrival date & time: 02/05/20  1140     History Chief Complaint  Patient presents with  . Shortness of Breath    Crystal [name] is a 86 y.o. female with PMH significant for HTN, cardiomyopathy, and CHF who presents to the ED with complaints of bilateral leg swelling and orthopnea.  Patient states that she has been taking her medications as prescribed, however is feeling particularly short of breath.  She is accompanied by her husband.  Patient had been admitted for CHF exacerbation 11/21/2019 for IV diuresis.  Echocardiogram performed shortly thereafter demonstrates EF of 30 to 35% with grade 2 diastolic dysfunction and reduced RV function.  Patient was seen by her cardiologist today who noted marked volume overload status that will require admission for IV diuresis.  She had been treated with milrinone given impaired renal function with good effect.  HPI     Past Medical History:  Diagnosis Date  . Anemia 08/21/2017  . Anxiety   . Cardiomyopathy    non ischemic NL cos on cath 08/2009, EF to 15-20%, Her follow up  2D echo  in 10/2009 showed impoved EFo 35-40%  . CHF (congestive heart failure) ([place])   . Cough   . Depression   . Dyspnea   . Edema of leg   . Full dentures   . Hypercalcemia 04/20/2017  . Hypertension   . Hypothyroid   . Lung mass   . Medicare annual wellness visit, subsequent 12/14/2014   Follows with Dr Marin Olp Follows with Dr Deatra Ina, gastroenterology, last colonoscopy in 2012, repeat in 2017 Last Pap 2011, always normal, no need for repeat Last MGM in 2011, no concerns, declines for now Follows with Dr Stanford Breed of cardiology   . Non Hodgkin's lymphoma ([place]) 11/17/1999   -Dr. Marin Olp  . Pleural effusion   . Vitamin D deficiency 10/17/2015  . Wears glasses     Patient Active Problem List   Diagnosis Date Noted  . Acute on chronic combined systolic and diastolic CHF (congestive heart  failure) ([place]) 02/05/2020  . Edema 01/31/2020  . Constipation 12/17/2019  . NSVT (nonsustained ventricular tachycardia) ([place])   . CHF (congestive heart failure) ([place]) 11/21/2019  . Hypoxia 05/29/2019  . Mediastinal adenopathy 05/24/2019  . Acute on chronic respiratory failure with hypoxemia ([place]) 01/12/2019  . CKD stage G3a/A1, GFR 45-59 and albumin creatinine ratio <30 mg/g 11/30/2018  . Acute on chronic combined systolic (congestive) and diastolic (congestive) heart failure ([place]) 11/29/2018  . Cavitating mass in left upper lung lobe 06/29/2018  . Hilar adenopathy 06/29/2018  . Sarcoidosis 06/22/2018  . IBS (irritable bowel syndrome) 02/21/2018  . Anemia 08/21/2017  . Hypercalcemia 04/20/2017  . Diffuse large B-cell lymphoma of lymph nodes of neck ([place]) 01/21/2017  . Mitral valve regurgitation, Moderate to severe 11/26/2015  . Chronic systolic heart failure ([place]) 11/26/2015  . Hyperlipidemia, mixed 10/17/2015  . Vitamin D deficiency 10/17/2015  . History of angioedema 06/11/2015  . Hematuria 06/11/2015  . Preventative health care 12/14/2014  . Hyperglycemia 03/24/2012  . Diverticulosis 10/01/2011  . Thrombocytopenia ([place]) 10/02/2010  . Hypothyroidism 01/24/2010  . RHEUMATOID FACTOR, POSITIVE 01/24/2010  . Non Hodgkin's lymphoma ([place]) 01/09/2010  . RAYNAUDS SYNDROME 01/09/2010  . Anxiety state 09/20/2009  . Essential hypertension 09/20/2009  . Nonischemic cardiomyopathy ([place]) 09/20/2009  . Congestive heart failure ([place]) 09/20/2009    Past Surgical History:  Procedure Laterality Date  . DILATION AND CURETTAGE  OF UTERUS    . MEDIASTINOSCOPY N/A 05/24/2019   Procedure: MEDIASTINOSCOPY;  Surgeon: Melrose Nakayama, MD;  Location: [facility] OR;  Service: Thoracic;  Laterality: N/A;  . MULTIPLE TOOTH EXTRACTIONS    . NASAL POLYP EXCISION  1962  . TONSILLECTOMY  1952  . VIDEO BRONCHOSCOPY WITH ENDOBRONCHIAL NAVIGATION Right 06/29/2018   Procedure: VIDEO BRONCHOSCOPY WITH  ENDOBRONCHIAL NAVIGATION;  Surgeon: Collene Gobble, MD;  Location: [facility];  Service: Thoracic;  Laterality: Right;  Marland Kitchen VIDEO BRONCHOSCOPY WITH ENDOBRONCHIAL NAVIGATION N/A 05/24/2019   Procedure: VIDEO BRONCHOSCOPY WITH ENDOBRONCHIAL NAVIGATION;  Surgeon: Melrose Nakayama, MD;  Location: [facility];  Service: Thoracic;  Laterality: N/A;  . VIDEO BRONCHOSCOPY WITH ENDOBRONCHIAL ULTRASOUND Right 06/29/2018   Procedure: VIDEO BRONCHOSCOPY WITH ENDOBRONCHIAL ULTRASOUND;  Surgeon: Collene Gobble, MD;  Location: [facility];  Service: Thoracic;  Laterality: Right;  Marland Kitchen VIDEO BRONCHOSCOPY WITH ENDOBRONCHIAL ULTRASOUND N/A 05/24/2019   Procedure: VIDEO BRONCHOSCOPY WITH ENDOBRONCHIAL ULTRASOUND;  Surgeon: Melrose Nakayama, MD;  Location: [facility];  Service: Thoracic;  Laterality: N/A;     OB History   No obstetric history on file.     Family History  Problem Relation Age of Onset  . Diabetes Mother        deceased secondary to diabetes  . Hypertension Mother   . Vision loss Mother   . Pneumonia Father        died age 87 due to complications of pneumonia  . Colon polyps Father   . Liver disease Daughter   . Sarcoidosis Daughter   . Diabetes Sister   . Colon cancer Brother 3  . Cancer Brother   . Obesity Son   . Diabetes Brother   . Kidney disease Brother   . Diabetes Sister   . Sarcoidosis Sister   . Arthritis Sister   . Arthritis Sister   . Diabetes Sister   . Arthritis Sister   . Diabetes Sister   . Hypertension Daughter   . Hypertension Daughter   . Other Other        no obvious premature cardiovascular disease or familial cardiomyopathy is noted  . Alcohol abuse Neg Hx   . Heart disease Neg Hx     Social History   Tobacco Use  . Smoking status: Never Smoker  . Smokeless tobacco: Never Used  Substance Use Topics  . Alcohol use: Never    Alcohol/week: 0.0 standard drinks  . Drug use: Never    Home Medications Prior to Admission medications   Medication Sig Start Date End  Date Taking? Authorizing Provider  acetaminophen (TYLENOL) 325 MG tablet Take 2 tablets (650 mg total) by mouth every 6 (six) hours as needed for fever, headache, mild pain or moderate pain. 05/25/19  Yes Lars Pinks M, PA-C  albuterol (VENTOLIN HFA) 108 (90 Base) MCG/ACT inhaler INHALE 1 TO 2 PUFFS BY MOUTH EVERY 4 HOURS AS NEEDED FOR WHEEZING FOR SHORTNESS OF BREATH Patient taking differently: Inhale 1-2 puffs into the lungs every 4 (four) hours as needed for shortness of breath.  11/02/19  Yes Mosie Lukes, MD  ALPRAZolam Duanne Moron) 0.25 MG tablet Take 1 tablet by mouth twice daily as needed for anxiety Patient taking differently: Take 0.25 mg by mouth 2 (two) times daily as needed for anxiety.  12/12/19  Yes Mosie Lukes, MD  benzonatate (TESSALON) 100 MG capsule TAKE 1 TO 2 CAPSULES BY MOUTH THREE TIMES DAILY AS NEEDED FOR COUGH Patient taking differently: Take 100-200 mg by  mouth 3 (three) times daily as needed for cough.  01/15/20  Yes Mosie Lukes, MD  carvedilol (COREG) 3.125 MG tablet Take 1 tablet (3.125 mg total) by mouth 2 (two) times daily. 12/11/19  Yes Verta Ellen., NP  docusate sodium (COLACE) 100 MG capsule Take 200 mg by mouth daily as needed for mild constipation.   Yes [provider]  furosemide (LASIX) 40 MG tablet Take 1 tablet (40 mg total) by mouth daily. OR AS DIRECTED Patient taking differently: Take 40 mg by mouth See admin instructions. Take mg by mouth in the morning OR AS DIRECTED 11/30/19 02/28/20 Yes Barrett, Evelene Croon, PA-C  isosorbide mononitrate (IMDUR) 30 MG 24 hr tablet Take 0.5 tablets (15 mg total) by mouth daily. 12/01/19  Yes Barrett, Evelene Croon, PA-C  levothyroxine (EUTHYROX) 50 MCG tablet Take 50 mcg by mouth daily before breakfast.   Yes [provider]  Multiple Vitamins-Minerals (CENTRUM PO) Take 1 tablet by mouth daily.    Yes [provider]  Omega-3 Fatty Acids (FISH OIL) 1000 MG CAPS Take 1,000 mg by mouth daily.    Yes [provider]  potassium chloride SA (KLOR-CON) 20 MEQ tablet Take 1 tablet (20 mEq total) by mouth daily as needed. 01/30/20  Yes Mosie Lukes, MD  spironolactone (ALDACTONE) 25 MG tablet Take 12.5 mg by mouth in the morning.   Yes [provider]  Probiotic Product (PROBIOTIC DAILY PO) Take 1 capsule by mouth daily.     [provider]    Allergies    Hydralazine hcl, Lisinopril, and Iohexol  Review of Systems   Review of Systems  Physical Exam Updated Vital Signs BP 107/76   Pulse 70   Resp 18   Ht 5\' 3"  (1.6 m)   Wt 64.9 kg   SpO2 100%   BMI 25.33 kg/m   Physical Exam Vitals and nursing note reviewed. Exam conducted with a chaperone present.  Constitutional:      Appearance: She is ill-appearing.  HENT:     Head: Normocephalic and atraumatic.  Eyes:     General: No scleral icterus.    Conjunctiva/sclera: Conjunctivae normal.  Cardiovascular:     Rate and Rhythm: Normal rate and regular rhythm.     Pulses: Normal pulses.     Heart sounds: Normal heart sounds.  Pulmonary:     Comments: Increased work of breathing.  No accessory muscle use.  Tachypneic in 30s.  Worse on exertion.  Breath sounds intact bilaterally. Musculoskeletal:     Comments: 2+ pitting edema bilaterally.  Sensation intact.  ROM intact.  Skin:    General: Skin is dry.  Neurological:     Mental Status: She is alert and oriented to person, place, and time.     GCS: GCS eye subscore is 4. GCS verbal subscore is 5. GCS motor subscore is 6.  Psychiatric:        Mood and Affect: Mood normal.        Behavior: Behavior normal.        Thought Content: Thought content normal.     ED Results / Procedures / Treatments   Labs (all labs ordered are listed, but only abnormal results are displayed) Labs Reviewed  BASIC METABOLIC PANEL - Abnormal; Notable for the following components:      Result Value   Sodium 134 (*)    CO2 21 (*)    Glucose, Bld 133 (*)    BUN 60  (*)  Creatinine, Ser 1.69 (*)    GFR calc non Af Amer 27 (*)    GFR calc Af Amer 32 (*)    All other components within normal limits  CBC - Abnormal; Notable for the following components:   WBC 3.0 (*)    RDW 16.5 (*)    Platelets 111 (*)    All other components within normal limits  SARS CORONAVIRUS 2 (TAT 6-24 HRS)  BRAIN NATRIURETIC PEPTIDE  TROPONIN I (HIGH SENSITIVITY)  TROPONIN I (HIGH SENSITIVITY)    EKG EKG Interpretation  Date/Time:  Monday February 05 2020 12:19:24 EDT Ventricular Rate:  74 PR Interval:  218 QRS Duration: 134 QT Interval:  438 QTC Calculation: 486 R Axis:   -54 Text Interpretation: Sinus rhythm with 1st degree A-V block with occasional Premature ventricular complexes Left axis deviation Right bundle branch block Minimal voltage criteria for LVH, may be normal variant ( R in aVL ) Anterolateral infarct , age undetermined Abnormal ECG Confirmed by Quintella Reichert [phone]) on 02/05/2020 12:52:25 PM   Radiology DG Chest 2 View  Result Date: 02/05/2020 CLINICAL DATA:  Shortness of breath. EXAM: CHEST - 2 VIEW COMPARISON:  November 27, 2019. FINDINGS: Stable cardiomegaly. No pneumothorax is noted. Hypoinflation of the lungs is noted. Bilateral perihilar and basilar atelectasis or edema is noted with small bilateral pleural effusions. Bony thorax is unremarkable. Atherosclerosis of thoracic aorta. IMPRESSION: Hypoinflation of the lungs with bilateral perihilar and basilar atelectasis or edema. Small bilateral pleural effusions are noted. Aortic Atherosclerosis (ICD10-I70.0). Electronically Signed   By: Marijo Conception M.D.   On: 02/05/2020 12:53    Procedures Procedures (including critical care time)  Medications Ordered in ED Medications  sodium chloride flush (NS) 0.9 % injection 3 mL (3 mLs Intravenous Not Given 02/05/20 1306)  furosemide (LASIX) injection 80 mg (80 mg Intravenous Given 02/05/20 1400)    ED Course  I have reviewed the triage vital signs  and the nursing notes.  Pertinent labs & imaging results that were available during my care of the patient were reviewed by me and considered in my medical decision making (see chart for details).  Clinical Course as of Feb 05 1448  Mon Feb 05, 2020  1359 Spoke with cardiology who is already aware of patient from Dr. Stanford Breed and is working on admitting orders.   [GG]    Clinical Course User Index [GG] Corena Herter, PA-C   MDM Rules/Calculators/A&P                      History physical examination is consistent with CHF exacerbation.  Patient has been evaluated by cardiology this morning for to the ED with plan for admission for IV diuresis.  K[place] PA-C has already placed orders for 80 mg IV diuresis here in the ED.  She may benefit from milrinone as that has worked for her in the past.  She is tachypneic on my examination and short of breath with exertion.  DG chest was interpreted demonstrates small pleural effusions bilaterally.  Consult placed to cardiology with BNP pending, but suspect that they have already been made aware of patient.  Dr. Stanford Breed is her cardiologist.  Damaris Schooner with cardiology who is already aware of patient and is working on admitting orders.  Patient to be admitted for her CHF exacerbation.    Final Clinical Impression(s) / ED Diagnoses Final diagnoses:  Acute on chronic congestive heart failure, unspecified heart failure type ([place])    Rx /  DC Orders ED Discharge Orders    None       Reita Chard 02/05/20 1449    Quintella Reichert, MD 02/07/20 1053

## 2020-02-05 NOTE — Progress Notes (Signed)
CCMD notified of multifocal PVC's and 5-6 beat runs of vtach. Pt asleep but easily arousable and states she feels fine. Appears to have tachypnea, RR 24-30 with O2 @2L  Crystal [name] and O2 sat 100%. MD on call paged.

## 2020-02-05 NOTE — ED Triage Notes (Signed)
Pt here from home with c/o fluid build up on her legs and stomach , hx of chf , pt has been while walking and sleeping at night sitting up

## 2020-02-06 ENCOUNTER — Encounter (HOSPITAL_COMMUNITY): Payer: Self-pay | Admitting: Cardiology

## 2020-02-06 ENCOUNTER — Inpatient Hospital Stay: Payer: Self-pay

## 2020-02-06 DIAGNOSIS — I10 Essential (primary) hypertension: Secondary | ICD-10-CM

## 2020-02-06 DIAGNOSIS — N183 Chronic kidney disease, stage 3 unspecified: Secondary | ICD-10-CM

## 2020-02-06 DIAGNOSIS — I5043 Acute on chronic combined systolic (congestive) and diastolic (congestive) heart failure: Secondary | ICD-10-CM

## 2020-02-06 LAB — BASIC METABOLIC PANEL
Anion gap: 10 (ref 5–15)
BUN: 65 mg/dL — ABNORMAL HIGH (ref 8–23)
CO2: 21 mmol/L — ABNORMAL LOW (ref 22–32)
Calcium: 9.6 mg/dL (ref 8.9–10.3)
Chloride: 105 mmol/L (ref 98–111)
Creatinine, Ser: 1.95 mg/dL — ABNORMAL HIGH (ref 0.44–1.00)
GFR calc Af Amer: 27 mL/min — ABNORMAL LOW (ref >60)
GFR calc non Af Amer: 23 mL/min — ABNORMAL LOW (ref >60)
Glucose, Bld: 103 mg/dL — ABNORMAL HIGH (ref 70–99)
Potassium: 4.6 mmol/L (ref 3.5–5.1)
Sodium: 136 mmol/L (ref 135–145)

## 2020-02-06 LAB — COOXEMETRY PANEL
Carboxyhemoglobin: 1.1 % (ref 0.5–1.5)
Methemoglobin: 1 % (ref 0.0–1.5)
O2 Saturation: 36.4 %
Total hemoglobin: 15 g/dL (ref 12.0–16.0)

## 2020-02-06 MED ORDER — SODIUM CHLORIDE 0.9% FLUSH: 10.0000 mL | INTRAVENOUS | Status: DC | PRN

## 2020-02-06 MED ORDER — FUROSEMIDE 10 MG/ML IJ SOLN
20.0000 mg/h | INTRAVENOUS | Status: DC
  Administered 2020-02-06: 15:00:00 8 mg/h via INTRAVENOUS
  Administered 2020-02-07 – 2020-02-12 (×5): 12 mg/h via INTRAVENOUS
  Administered 2020-02-13 – 2020-02-14 (×3): 20 mg/h via INTRAVENOUS
  Filled 2020-02-06 (×7): qty 25
  Filled 2020-02-06: qty 21
  Filled 2020-02-06 (×4): qty 25

## 2020-02-06 MED ORDER — CHLORHEXIDINE GLUCONATE CLOTH 2 % EX PADS
6.0000 | MEDICATED_PAD | Freq: Every day | CUTANEOUS | Status: DC
  Administered 2020-02-06 – 2020-02-13 (×8): 6 via TOPICAL

## 2020-02-06 MED ORDER — MILRINONE LACTATE IN DEXTROSE 20-5 MG/100ML-% IV SOLN
0.1250 ug/kg/min | INTRAVENOUS | Status: DC
  Administered 2020-02-06 – 2020-02-08 (×3): 0.25 ug/kg/min via INTRAVENOUS
  Administered 2020-02-12 – 2020-02-14 (×2): 0.125 ug/kg/min via INTRAVENOUS
  Filled 2020-02-06 (×7): qty 100

## 2020-02-06 MED ORDER — SODIUM CHLORIDE 0.9% FLUSH
10.0000 mL | Freq: Two times a day (BID) | INTRAVENOUS | Status: DC
  Administered 2020-02-06: 20:00:00 30 mL
  Administered 2020-02-07: 10 mL
  Administered 2020-02-08: 22:00:00 20 mL
  Administered 2020-02-09 – 2020-02-11 (×5): 10 mL

## 2020-02-06 NOTE — Plan of Care (Signed)

## 2020-02-06 NOTE — Progress Notes (Signed)
Progress Note  Patient Name: Crystal [name] Date of Encounter: 02/06/2020  Primary Cardiologist:   [name], MD   Subjective   She feels OK and says that she is not coughing as much  Inpatient Medications    Scheduled Meds: . furosemide  80 mg Intravenous BID  . heparin  5,000 Units Subcutaneous Q8H  . isosorbide mononitrate  15 mg Oral Daily  . levothyroxine  50 mcg Oral Daily  . sodium chloride flush  3 mL Intravenous Once  . sodium chloride flush  3 mL Intravenous Q12H   Continuous Infusions: . sodium chloride     PRN Meds: sodium chloride, acetaminophen, albuterol, ALPRAZolam, guaiFENesin-dextromethorphan, ondansetron (ZOFRAN) IV, sodium chloride flush   Vital Signs    Vitals:   02/06/20 0412 02/06/20 0726 02/06/20 0929 02/06/20 1124  BP: 110/60 101/69 104/78 98/63  Pulse: 76 70 80 75  Resp: 20 19 (!) 22 (!) 21  Temp: 98.7 F (37.1 C)  98.8 F (37.1 C)   TempSrc:   Oral   SpO2: 100% 96% 100% 98%  Weight: 63.5 kg     Height:        Intake/Output Summary (Last 24 hours) at 02/06/2020 1308 Last data filed at 02/06/2020 1002 Gross per 24 hour  Intake 720 ml  Output 750 ml  Net -30 ml   Filed Weights   02/05/20 1351 02/06/20 0412  Weight: 64.9 kg 63.5 kg    Telemetry    NSR, PACs, NSVT - Personally Reviewed  ECG    NA - Personally Reviewed  Physical Exam   GEN: No acute distress.   Neck:  JVD to jaw at 45 degrees Cardiac: RRR, no murmurs, rubs, or gallops.  Respiratory: Clear to auscultation bilaterally. GI: Soft, nontender, non-distended  MS:   Severe edema; No deformity. Neuro:  Nonfocal  Psych: Normal affect   Labs    Chemistry Recent Labs  Lab 02/05/20 1221 02/06/20 0427  NA 134* 136  K 4.7 4.6  CL 101 105  CO2 21* 21*  GLUCOSE 133* 103*  BUN 60* 65*  CREATININE 1.69* 1.95*  CALCIUM 9.6 9.6  GFRNONAA 27* 23*  GFRAA 32* 27*  ANIONGAP 12 10     Hematology Recent Labs  Lab 02/05/20 1221  WBC 3.0*  RBC 4.12    HGB 12.4  HCT 38.3  MCV 93.0  MCH 30.1  MCHC 32.4  RDW 16.5*  PLT 111*    Cardiac EnzymesNo results for input(s): TROPONINI in the last 168 hours. No results for input(s): TROPIPOC in the last 168 hours.   BNP Recent Labs  Lab 02/05/20 1332  BNP 3,829.9*     DDimer No results for input(s): DDIMER in the last 168 hours.   Radiology    DG Chest 2 View  Result Date: 02/05/2020 CLINICAL DATA:  Shortness of breath. EXAM: CHEST - 2 VIEW COMPARISON:  November 27, 2019. FINDINGS: Stable cardiomegaly. No pneumothorax is noted. Hypoinflation of the lungs is noted. Bilateral perihilar and basilar atelectasis or edema is noted with small bilateral pleural effusions. Bony thorax is unremarkable. Atherosclerosis of thoracic aorta. IMPRESSION: Hypoinflation of the lungs with bilateral perihilar and basilar atelectasis or edema. Small bilateral pleural effusions are noted. Aortic Atherosclerosis (ICD10-I70.0). Electronically Signed   By: Marijo Conception M.D.   On: 02/05/2020 12:53    Cardiac Studies   11/24/19 echo:    1. Limited exam with contrast to rule out apical thrombus  2. No apical thrombus seen  3. Left ventricular ejection fraction, by visual estimation, is 25 to  30%. The left ventricle has severely decreased function. There is no  increased left ventricular wall thickness.   FINDINGS  Left Ventricle: Left ventricular ejection fraction, by visual estimation,  is 25 to 30%. The left ventricle has severely decreased function. There is  no increased left ventricular wall thickness.   Patient Profile     86 y.o. female with with PMH of non-ischemic cardiomyopathy, HTN, hypothyroidism, sarcoidosis, and anxiety, who presents for management of her CHF.  Assessment & Plan    ACUTE ON CHRONIC SYSTOLIC HF:   I/O incomplete.   I will change to IV Lasix drip.  If her creat goes up she will likely need milrinone.   I will ask for a PICC line for CVP and CoOx.  Might need Advanced  Team.    HTN:  Hypotensive.    CKD III:  Creat increased.   SARCOID:  No change in therapy.   For questions or updates, please contact [place] Please consult [email or web address] for contact info under Cardiology/STEMI.   Signed, Minus Breeding, MD  02/06/2020, 1:08 PM

## 2020-02-07 LAB — BASIC METABOLIC PANEL
Anion gap: 10 (ref 5–15)
BUN: 61 mg/dL — ABNORMAL HIGH (ref 8–23)
CO2: 23 mmol/L (ref 22–32)
Calcium: 9.3 mg/dL (ref 8.9–10.3)
Chloride: 101 mmol/L (ref 98–111)
Creatinine, Ser: 1.77 mg/dL — ABNORMAL HIGH (ref 0.44–1.00)
GFR calc Af Amer: 30 mL/min — ABNORMAL LOW (ref >60)
GFR calc non Af Amer: 26 mL/min — ABNORMAL LOW (ref >60)
Glucose, Bld: 119 mg/dL — ABNORMAL HIGH (ref 70–99)
Potassium: 3.8 mmol/L (ref 3.5–5.1)
Sodium: 134 mmol/L — ABNORMAL LOW (ref 135–145)

## 2020-02-07 LAB — COOXEMETRY PANEL
Carboxyhemoglobin: 1.7 % — ABNORMAL HIGH (ref 0.5–1.5)
Methemoglobin: 1.1 % (ref 0.0–1.5)
O2 Saturation: 60.7 %
Total hemoglobin: 12 g/dL (ref 12.0–16.0)

## 2020-02-07 MED ORDER — METOLAZONE 2.5 MG PO TABS
2.5000 mg | ORAL_TABLET | Freq: Once | ORAL | Status: AC
  Administered 2020-02-08: 09:00:00 2.5 mg via ORAL
  Filled 2020-02-07: qty 1

## 2020-02-07 MED ORDER — METOLAZONE 2.5 MG PO TABS
2.5000 mg | ORAL_TABLET | Freq: Once | ORAL | Status: AC
  Administered 2020-02-07: 09:00:00 2.5 mg via ORAL
  Filled 2020-02-07: qty 1

## 2020-02-07 NOTE — Progress Notes (Signed)
Co ox drawn and sent to lab. Notified David in lab of co ox being tubed at this time.

## 2020-02-07 NOTE — Plan of Care (Signed)

## 2020-02-07 NOTE — TOC Progression Note (Signed)
Transition of Care [facility]) - Progression Note    Patient Details  Name: Crystal [name] MRN: [id number] Date of Birth: 03-07-1934  [facility] CM/SW Contact  Zenon Mayo, RN Phone Number: 02/07/2020, 1:12 PM  Clinical Narrative:    NCM spoke with patient and spouse at bedside, NCM asked is she would like [facility] for CHF management, she states yes. She does not have a preference of which agency.  NCM made referral to [facility] with [facility].  She states she is able to take referral.  Soc will begin 24 to 48 hrs post dc.  Will need HHRN order.    Expected Discharge Plan: [place] Barriers to Discharge: Continued Medical Work up  Expected Discharge Plan and Services Expected Discharge Plan: [place]   Discharge Planning Services: CM Consult Post Acute Care Choice: [place] arrangements for the past 2 months: Single Family Home                   DME Agency: NA       HH Arranged: RN [place] Agency: ([place]) Date [place]: 02/07/20 Time [place]: [place] Representative spoke with at [place]: [place] ([place]) Interventions    Readmission Risk Interventions No flowsheet data found.

## 2020-02-07 NOTE — Consult Note (Addendum)
Advanced Heart Failure Team Consult Note   Primary Physician: Mosie Lukes, MD PCP-Cardiologist:  [name], MD    Reason for Consultation: Acute on chronic biventricular heart failure  w/ low output   HPI:    Crystal [name] is seen today for evaluation of acute on chronic biventricular heart failure with low output at the request of Dr. Percival Spanish, General Cardiology.   86 y/o woman with h/o hypertension, anxiety, lymphoma treated with chemo and XRT in 1601, chronic systolic CHF 2/2 NICM that was first diagnosed in 08/2009. EF at that time was 15-20% and LHC showed normal coronaries. Also has pulmonary sarcoidosis that is followed by pulmonology. Uncertain if cardiac involvement.   She was recently admitted 1/21 for a/c CHF w/ marked volume overload. She was managed by general cardiology and required milrinone and high dose diuretics for diuresis. Echo showed LVEF 30-35% w/ global hypokinesis w/ inferior/inferoseptal akinesis, G2DD but no LVH. RV was mildly enlarged and systolic function moderately reduced. There was concern for possible LV thrombus on TTE. TEE was performed and negative for thombus.     She has now been readmitted for a/c CHF. Markedly volume overloaded on admit. SCr worsened w/ attempts at diuresis, raising concern for low output. PICC line placed and initial Co-ox was 36%. Was started on milrinone 0.25 mcg. Co-ox improved today at 61%. Currently on lasix gtt at 8 mg/hr. Remains markedly volume overloaded. CVP ~20. SCr improving, 1.95>>1.77 (baseline 1.3-1.5).  She is comfortable currently at rest. O2 sats stable on RA ~97% but appears dyspneic during conversation. Poor functional capacity at baseline. She lives w/ her husband and is fairly dependent on him. Chronically NYHA Class III, which recently progressed to Class IIIb-IV just prior to this admit.   Tele shows NSR w/ frequent PVCs. BP soft, low 093A systolic.    Review of Systems: [y] = yes, [ ]  = no    . General: Weight gain [ ] ; Weight loss [ ] ; Anorexia [ ] ; Fatigue [ ] ; Fever [ ] ; Chills [ ] ; Weakness [ ]   . Cardiac: Chest pain/pressure [ ] ; Resting SOB [ ] ; Exertional SOB [ ] ; Orthopnea [ ] ; Pedal Edema [ ] ; Palpitations [ ] ; Syncope [ ] ; Presyncope [ ] ; Paroxysmal nocturnal dyspnea[ ]   . Pulmonary: Cough [ ] ; Wheezing[ ] ; Hemoptysis[ ] ; Sputum [ ] ; Snoring [ ]   . GI: Vomiting[ ] ; Dysphagia[ ] ; Melena[ ] ; Hematochezia [ ] ; Heartburn[ ] ; Abdominal pain [ ] ; Constipation [ ] ; Diarrhea [ ] ; BRBPR [ ]   . GU: Hematuria[ ] ; Dysuria [ ] ; Nocturia[ ]   . Vascular: Pain in legs with walking [ ] ; Pain in feet with lying flat [ ] ; Non-healing sores [ ] ; Stroke [ ] ; TIA [ ] ; Slurred speech [ ] ;  . Neuro: Headaches[ ] ; Vertigo[ ] ; Seizures[ ] ; Paresthesias[ ] ;Blurred vision [ ] ; Diplopia [ ] ; Vision changes [ ]   . Ortho/Skin: Arthritis [ ] ; Joint pain [ ] ; Muscle pain [ ] ; Joint swelling [ ] ; Back Pain [ ] ; Rash [ ]   . Psych: Depression[ ] ; Anxiety[ ]   . Heme: Bleeding problems [ ] ; Clotting disorders [ ] ; Anemia [ ]   . Endocrine: Diabetes [ ] ; Thyroid dysfunction[ ]   Home Medications Prior to Admission medications   Medication Sig Start Date End Date Taking? Authorizing Provider  acetaminophen (TYLENOL) 325 MG tablet Take 2 tablets (650 mg total) by mouth every 6 (six) hours as needed for fever, headache, mild pain or moderate pain. 05/25/19  Yes Lars Pinks M, PA-C  albuterol (VENTOLIN HFA) 108 (90 Base) MCG/ACT inhaler INHALE 1 TO 2 PUFFS BY MOUTH EVERY 4 HOURS AS NEEDED FOR WHEEZING FOR SHORTNESS OF BREATH Patient taking differently: Inhale 1-2 puffs into the lungs every 4 (four) hours as needed for shortness of breath.  11/02/19  Yes Mosie Lukes, MD  ALPRAZolam Duanne Moron) 0.25 MG tablet Take 1 tablet by mouth twice daily as needed for anxiety Patient taking differently: Take 0.25 mg by mouth 2 (two) times daily as needed for anxiety.  12/12/19  Yes Mosie Lukes, MD  benzonatate  (TESSALON) 100 MG capsule TAKE 1 TO 2 CAPSULES BY MOUTH THREE TIMES DAILY AS NEEDED FOR COUGH Patient taking differently: Take 100-200 mg by mouth 3 (three) times daily as needed for cough.  01/15/20  Yes Mosie Lukes, MD  carvedilol (COREG) 3.125 MG tablet Take 1 tablet (3.125 mg total) by mouth 2 (two) times daily. 12/11/19  Yes Verta Ellen., NP  docusate sodium (COLACE) 100 MG capsule Take 200 mg by mouth daily as needed for mild constipation.   Yes [provider]  furosemide (LASIX) 40 MG tablet Take 1 tablet (40 mg total) by mouth daily. OR AS DIRECTED Patient taking differently: Take 40 mg by mouth See admin instructions. Take 40 mg by mouth in the morning OR AS DIRECTED 11/30/19 02/28/20 Yes Barrett, Evelene Croon, PA-C  isosorbide mononitrate (IMDUR) 30 MG 24 hr tablet Take 0.5 tablets (15 mg total) by mouth daily. 12/01/19  Yes Barrett, Evelene Croon, PA-C  levothyroxine (EUTHYROX) 50 MCG tablet Take 50 mcg by mouth daily before breakfast.   Yes [provider]  Multiple Vitamins-Minerals (CENTRUM PO) Take 1 tablet by mouth daily.    Yes [provider]  Omega-3 Fatty Acids (FISH OIL) 1000 MG CAPS Take 1,000 mg by mouth daily.   Yes [provider]  potassium chloride SA (KLOR-CON) 20 MEQ tablet Take 1 tablet (20 mEq total) by mouth daily as needed. Patient taking differently: Take 20 mEq by mouth daily as needed (AS DIRECTED).  01/30/20  Yes Mosie Lukes, MD  spironolactone (ALDACTONE) 25 MG tablet Take 12.5 mg by mouth in the morning.   Yes [provider]  Probiotic Product (PROBIOTIC DAILY PO) Take 1 capsule by mouth daily.     [provider]    Past Medical History: Past Medical History:  Diagnosis Date  . Anemia 08/21/2017  . Anxiety   . Cardiomyopathy    non ischemic NL cos on cath 08/2009, EF to 15-20%, Her follow up  2D echo  in 10/2009 showed impoved EFo 35-40%  . CHF (congestive heart failure) ([place])   . Cough   .  Depression   . Dyspnea   . Edema of leg   . Full dentures   . Hypercalcemia 04/20/2017  . Hypertension   . Hypothyroid   . Lung mass   . Medicare annual wellness visit, subsequent 12/14/2014   Follows with Dr Marin Olp Follows with Dr Deatra Ina, gastroenterology, last colonoscopy in 2012, repeat in 2017 Last Pap 2011, always normal, no need for repeat Last MGM in 2011, no concerns, declines for now Follows with Dr Stanford Breed of cardiology   . Non Hodgkin's lymphoma ([place]) 11/17/1999   -Dr. Marin Olp  . Pleural effusion   . Vitamin D deficiency 10/17/2015  . Wears glasses     Past Surgical History: Past Surgical History:  Procedure Laterality Date  . DILATION AND CURETTAGE OF  UTERUS    . MEDIASTINOSCOPY N/A 05/24/2019   Procedure: MEDIASTINOSCOPY;  Surgeon: Melrose Nakayama, MD;  Location: [facility] OR;  Service: Thoracic;  Laterality: N/A;  . MULTIPLE TOOTH EXTRACTIONS    . NASAL POLYP EXCISION  1962  . TONSILLECTOMY  1952  . VIDEO BRONCHOSCOPY WITH ENDOBRONCHIAL NAVIGATION Right 06/29/2018   Procedure: VIDEO BRONCHOSCOPY WITH ENDOBRONCHIAL NAVIGATION;  Surgeon: Collene Gobble, MD;  Location: [place];  Service: Thoracic;  Laterality: Right;  Marland Kitchen VIDEO BRONCHOSCOPY WITH ENDOBRONCHIAL NAVIGATION N/A 05/24/2019   Procedure: VIDEO BRONCHOSCOPY WITH ENDOBRONCHIAL NAVIGATION;  Surgeon: Melrose Nakayama, MD;  Location: [place];  Service: Thoracic;  Laterality: N/A;  . VIDEO BRONCHOSCOPY WITH ENDOBRONCHIAL ULTRASOUND Right 06/29/2018   Procedure: VIDEO BRONCHOSCOPY WITH ENDOBRONCHIAL ULTRASOUND;  Surgeon: Collene Gobble, MD;  Location: [place];  Service: Thoracic;  Laterality: Right;  Marland Kitchen VIDEO BRONCHOSCOPY WITH ENDOBRONCHIAL ULTRASOUND N/A 05/24/2019   Procedure: VIDEO BRONCHOSCOPY WITH ENDOBRONCHIAL ULTRASOUND;  Surgeon: Melrose Nakayama, MD;  Location: [facility] OR;  Service: Thoracic;  Laterality: N/A;    Family History: Family History  Problem Relation Age of Onset  . Diabetes Mother        deceased  secondary to diabetes  . Hypertension Mother   . Vision loss Mother   . Pneumonia Father        died age 76 due to complications of pneumonia  . Colon polyps Father   . Liver disease Daughter   . Sarcoidosis Daughter   . Diabetes Sister   . Colon cancer Brother 92  . Cancer Brother   . Obesity Son   . Diabetes Brother   . Kidney disease Brother   . Diabetes Sister   . Sarcoidosis Sister   . Arthritis Sister   . Arthritis Sister   . Diabetes Sister   . Arthritis Sister   . Diabetes Sister   . Hypertension Daughter   . Hypertension Daughter   . Other Other        no obvious premature cardiovascular disease or familial cardiomyopathy is noted  . Alcohol abuse Neg Hx   . Heart disease Neg Hx     Social History: Social History   Socioeconomic History  . Marital status: Married    Spouse name: Jeneen Rinks  . Number of children: 5  . Years of education: Not on file  . Highest education level: Not on file  Occupational History  . Occupation: retired  Tobacco Use  . Smoking status: Never Smoker  . Smokeless tobacco: Never Used  Substance and Sexual Activity  . Alcohol use: Never    Alcohol/week: 0.0 standard drinks  . Drug use: Never  . Sexual activity: Yes    Comment: lives with husband, no dietary restrictions, minimizes dairy  Other Topics Concern  . Not on file  Social History Narrative   The patient is married ( husband Avrey Flanagin)  She just recently celebrated     her 52nd anniversary.  She has five children.  There is no active     tobacco or alcohol use history.          Smoking Status:  never   Caffeine use/day:  None   Does Patient Exercise:  yes   Social Determinants of Health   Financial Resource Strain:   . Difficulty of Paying Living Expenses:   Food Insecurity:   . Worried About Charity fundraiser in the Last Year:   . [place] in the Last Year:  Transportation Needs:   . Film/video editor (Medical):   Marland Kitchen Lack of Transportation  (Non-Medical):   Physical Activity:   . Days of Exercise per Week:   . Minutes of Exercise per Session:   Stress:   . Feeling of Stress :   Social Connections:   . Frequency of Communication with Friends and Family:   . Frequency of Social Gatherings with Friends and Family:   . Attends Religious Services:   . Active Member of Clubs or Organizations:   . Attends Archivist Meetings:   Marland Kitchen Marital Status:     Allergies:  Allergies  Allergen Reactions  . Hydralazine Hcl Itching and Other (See Comments)    Hypotension and caused SEVERE ITCHING  . Lisinopril Swelling and Other (See Comments)    Facial and lip swelling  . Iohexol Itching         Objective:    Vital Signs:   Temp:  [97.2 F (36.2 C)-98.6 F (37 C)] 97.5 F (36.4 C) (03/24 1517) Pulse Rate:  [71-91] 91 (03/24 1300) Resp:  [18-36] 21 (03/24 1500) BP: (89-145)/(57-95) 117/78 (03/24 1500) SpO2:  [92 %-100 %] 95 % (03/24 1500) Weight:  [62.7 kg-63.2 kg] 62.7 kg (03/24 0640) Last BM Date: 02/04/20  Weight change: Filed Weights   02/06/20 0412 02/07/20 0015 02/07/20 0640  Weight: 63.5 kg 63.2 kg 62.7 kg    Intake/Output:   Intake/Output Summary (Last 24 hours) at 02/07/2020 1543 Last data filed at 02/07/2020 1350 Gross per 24 hour  Intake 416.99 ml  Output 1950 ml  Net -1533.01 ml      Physical Exam   CVP 18-20  General:  cachetic appearing elderly AAF No resp difficulty HEENT: normal Neck: supple. elevated JVP to ear . Carotids 2+ bilat; no bruits. No lymphadenopathy or thyromegaly appreciated. Cor: PMI nondisplaced. Irregular rhythm w/ tachy rate (frequent PVCs, NSVT) No rubs, gallops or murmurs. Lungs: decreased BS at bases, faint bibasilar crackles, L>R Abdomen: soft, nontender, nondistended. No hepatosplenomegaly. No bruits or masses. Good bowel sounds. Extremities: no cyanosis, clubbing, rash, 2+ bilateral LE edema up to knees. + TED hoses  Neuro: alert & orientedx3, cranial nerves  grossly intact. moves all 4 extremities w/o difficulty. Affect pleasant   Telemetry   NSR w/ frequent ventricular ectopy, PVCs and NSVT (on milrinone)  EKG    NSR w/ RBBB, PVCs  Labs   Basic Metabolic Panel: Recent Labs  Lab 02/05/20 1221 02/06/20 0427 02/07/20 0359  NA 134* 136 134*  K 4.7 4.6 3.8  CL 101 105 101  CO2 21* 21* 23  GLUCOSE 133* 103* 119*  BUN 60* 65* 61*  CREATININE 1.69* 1.95* 1.77*  CALCIUM 9.6 9.6 9.3    Liver Function Tests: No results for input(s): AST, ALT, ALKPHOS, BILITOT, PROT, ALBUMIN in the last 168 hours. No results for input(s): LIPASE, AMYLASE in the last 168 hours. No results for input(s): AMMONIA in the last 168 hours.  CBC: Recent Labs  Lab 02/05/20 1221  WBC 3.0*  HGB 12.4  HCT 38.3  MCV 93.0  PLT 111*    Cardiac Enzymes: No results for input(s): CKTOTAL, CKMB, CKMBINDEX, TROPONINI in the last 168 hours.  BNP: BNP (last 3 results) Recent Labs    11/21/19 1321 02/05/20 1332  BNP 3,536.1* 3,829.9*    ProBNP (last 3 results) Recent Labs    10/20/19 1032 11/07/19 0943  PROBNP 13,688* 12,815*     CBG: No results for input(s): GLUCAP in the  last 168 hours.  Coagulation Studies: No results for input(s): LABPROT, INR in the last 72 hours.   Imaging    No results found.   Medications:     Current Medications: . Chlorhexidine Gluconate Cloth  6 each Topical Daily  . heparin  5,000 Units Subcutaneous Q8H  . isosorbide mononitrate  15 mg Oral Daily  . levothyroxine  50 mcg Oral Daily  . sodium chloride flush  10-40 mL Intracatheter Q12H  . sodium chloride flush  3 mL Intravenous Once  . sodium chloride flush  3 mL Intravenous Q12H     Infusions: . sodium chloride    . furosemide (LASIX) infusion 8 mg/hr (02/06/20 1700)  . milrinone 0.25 mcg/kg/min (02/07/20 1515)       Assessment/Plan   1. Acute on Chronic Biventricular Heart Failure w/ Low Output:  - NICM dating back to 2010. Echo in 2010  showed EF 15-20%. LHC showed normal cors - Echo 11/24/19 w/ EF 25-30% w/ global hypokinesis w/ inferior/inferoseptal akinesis, G2DD but no LVH. RV was mildly enlarged and systolic function moderately reduced. - has known pulmonary sarcoid but no known cardiac involvement  - This is 2nd admission for a/c CHF in last 3 months requiring milrinone - initial co-ox this admit 36%. Improved w/ milrinone 0.25. Co-ox up to 61% today  - remains markedly volume overloaded. CVP 18-20.  - continue lasix gtt. Diuresis sluggish on current rate. Recommend increasing to 12 mg/hr.  - can hold bidil to allow more BP room to push diuretics - continue milrinone at current rate. May need IV amiodarone given ventricular ectopy  - needs RHC to better evaluate - continue daily co-ox and CVP monitoring  - overall prognosis seems poor. Would be a poor candidate for LVAD given RV dysfunction  - need to involve palliative care team to discuss [place]   2. AKI: - likely cardiorenal/ due to low output - continue inotropes/diureics - follow BMP daily   3. Pulmonary Sarcoid - followed by pulmonology    Length of Stay: 2  Crystal Jester, PA-C  02/07/2020, 3:43 PM  Advanced Heart Failure Team Pager [phone] (M-F; 7a - 4p)  Please contact [facility] for night-coverage after hours (4p -7a ) and weekends on [email or web address]   Patient seen and examined with the above-signed Advanced Practice Provider and/or Housestaff. I personally reviewed laboratory data, imaging studies and relevant notes. I independently examined the patient and formulated the important aspects of the plan. I have edited the note to reflect any of my changes or salient points. I have personally discussed the plan with the patient and/or family.  86 y/o woman with h/o longstanding severe systolic HF due to NICM of unclear etiology. Now with progressive NYHA IIIB-IV HF symptoms leading to recurrent hospitalizations. Echo from 11/24/19 reviewed personally and  EF 25%. She also has advanced pulmonary sarcoid however PET scan in 2020 did not show significant cardiac uptake.   On admit this time she has severe volume overload and evidence of low output/shock with MV sat 36%.  On exam  Elderly frail. Dyspneic in bed JVP to ear Cor tachy + prominent s3 Lungs: basilar crackles Ab + distended Ext cool 3+ edema  She unfortunately has end-stage HF with severe biventricular failure with advanced symptoms and recurrent admissions. I think the only real option for her is Hospice. For now we will continue milrinone and IV diuresis but OI have asked her husband to come in tomorrow for [place] to discuss. We have also  placed a Palliative Care consult.   Crystal Bickers, MD  9:09 PM

## 2020-02-07 NOTE — Progress Notes (Signed)
Progress Note  Patient Name: Crystal [name] Date of Encounter: 02/07/2020  Primary Cardiologist:   [name], MD   Subjective   She says that she feels better.  Less cough.  Feels less "tight".  Breathing somewhat improved.    Inpatient Medications    Scheduled Meds: . Chlorhexidine Gluconate Cloth  6 each Topical Daily  . heparin  5,000 Units Subcutaneous Q8H  . isosorbide mononitrate  15 mg Oral Daily  . levothyroxine  50 mcg Oral Daily  . sodium chloride flush  10-40 mL Intracatheter Q12H  . sodium chloride flush  3 mL Intravenous Once  . sodium chloride flush  3 mL Intravenous Q12H   Continuous Infusions: . sodium chloride    . furosemide (LASIX) infusion 8 mg/hr (02/06/20 1700)  . milrinone 0.25 mcg/kg/min (02/06/20 2131)   PRN Meds: sodium chloride, acetaminophen, albuterol, ALPRAZolam, guaiFENesin-dextromethorphan, ondansetron (ZOFRAN) IV, sodium chloride flush, sodium chloride flush   Vital Signs    Vitals:   02/07/20 0300 02/07/20 0415 02/07/20 0640 02/07/20 0722  BP: (!) 100/57 103/63  92/73  Pulse:    85  Resp:  (!) 25  20  Temp:  98.6 F (37 C)  98 F (36.7 C)  TempSrc:  Oral    SpO2:  96%  97%  Weight:   62.7 kg   Height:        Intake/Output Summary (Last 24 hours) at 02/07/2020 0835 Last data filed at 02/07/2020 0400 Gross per 24 hour  Intake 726.99 ml  Output 1400 ml  Net -673.01 ml   Filed Weights   02/06/20 0412 02/07/20 0015 02/07/20 0640  Weight: 63.5 kg 63.2 kg 62.7 kg    Telemetry    NSR with PVCs and 3 beat runs of Ventricular tach.   - Personally Reviewed  ECG    NA - Personally Reviewed  Physical Exam   GEN: No acute distress.   Neck: 10 cm JVD Cardiac: RRR, no murmurs, rubs, or gallops.  Respiratory:   Decreased breath sounds at the bases.  GI: Soft, nontender, non-distended  MS:   Severe edema to thighs. Neuro:  Nonfocal  Psych: Normal affect   Labs    Chemistry Recent Labs  Lab 02/05/20 1221  02/06/20 0427 02/07/20 0359  NA 134* 136 134*  K 4.7 4.6 3.8  CL 101 105 101  CO2 21* 21* 23  GLUCOSE 133* 103* 119*  BUN 60* 65* 61*  CREATININE 1.69* 1.95* 1.77*  CALCIUM 9.6 9.6 9.3  GFRNONAA 27* 23* 26*  GFRAA 32* 27* 30*  ANIONGAP 12 10 10      Hematology Recent Labs  Lab 02/05/20 1221  WBC 3.0*  RBC 4.12  HGB 12.4  HCT 38.3  MCV 93.0  MCH 30.1  MCHC 32.4  RDW 16.5*  PLT 111*    Cardiac EnzymesNo results for input(s): TROPONINI in the last 168 hours. No results for input(s): TROPIPOC in the last 168 hours.   BNP Recent Labs  Lab 02/05/20 1332  BNP 3,829.9*     DDimer No results for input(s): DDIMER in the last 168 hours.   Radiology    DG Chest 2 View  Result Date: 02/05/2020 CLINICAL DATA:  Shortness of breath. EXAM: CHEST - 2 VIEW COMPARISON:  November 27, 2019. FINDINGS: Stable cardiomegaly. No pneumothorax is noted. Hypoinflation of the lungs is noted. Bilateral perihilar and basilar atelectasis or edema is noted with small bilateral pleural effusions. Bony thorax is unremarkable. Atherosclerosis of thoracic aorta. IMPRESSION:  Hypoinflation of the lungs with bilateral perihilar and basilar atelectasis or edema. Small bilateral pleural effusions are noted. Aortic Atherosclerosis (ICD10-I70.0). Electronically Signed   By: Marijo Conception M.D.   On: 02/05/2020 12:53   [place] EKG SITE RITE  Result Date: 02/06/2020 If [facility] image not attached, placement could not be confirmed due to current cardiac rhythm.   Cardiac Studies   Echo:  IMPRESSIONS    1. Limited exam with contrast to rule out apical thrombus  2. No apical thrombus seen  3. Left ventricular ejection fraction, by visual estimation, is 25 to  30%. The left ventricle has severely decreased function. There is no  increased left ventricular wall thickness.   FINDINGS  Left Ventricle: Left ventricular ejection fraction, by visual estimation,  is 25 to 30%. The left ventricle has severely  decreased function. There is  no increased left ventricular wall thickness.   Patient Profile     86 y.o. female with with PMH of non-ischemic cardiomyopathy, HTN, hypothyroidism, sarcoidosis, and anxiety, who presents for management of her CHF.  Assessment & Plan    ACUTE ON CHRONIC SYSTOLIC HF:   CoOx  60.1.  Milrinone started and repeat at 4 AM 60.7.  On IV Lasix drip.   CVP is slightly improved.   I will give a dose of Zaroxolyn today and discuss further with Advanced HF team.      HTN:  BPs labile but stable.    CKD III:  Creat going in the correct direction.Marland Kitchen   SARCOID:  No change in therapy.   For questions or updates, please contact [facility] Please consult [email or web address] for contact info under Cardiology/STEMI.   Signed, Minus Breeding, MD  02/07/2020, 8:35 AM

## 2020-02-08 DIAGNOSIS — R570 Cardiogenic shock: Secondary | ICD-10-CM

## 2020-02-08 LAB — COOXEMETRY PANEL
Carboxyhemoglobin: 1.8 % — ABNORMAL HIGH (ref 0.5–1.5)
Methemoglobin: 1.2 % (ref 0.0–1.5)
O2 Saturation: 75.4 %
Total hemoglobin: 11.3 g/dL — ABNORMAL LOW (ref 12.0–16.0)

## 2020-02-08 LAB — BASIC METABOLIC PANEL
Anion gap: 11 (ref 5–15)
BUN: 61 mg/dL — ABNORMAL HIGH (ref 8–23)
CO2: 25 mmol/L (ref 22–32)
Calcium: 9.3 mg/dL (ref 8.9–10.3)
Chloride: 99 mmol/L (ref 98–111)
Creatinine, Ser: 1.84 mg/dL — ABNORMAL HIGH (ref 0.44–1.00)
GFR calc Af Amer: 29 mL/min — ABNORMAL LOW (ref >60)
GFR calc non Af Amer: 25 mL/min — ABNORMAL LOW (ref >60)
Glucose, Bld: 139 mg/dL — ABNORMAL HIGH (ref 70–99)
Potassium: 3.3 mmol/L — ABNORMAL LOW (ref 3.5–5.1)
Sodium: 135 mmol/L (ref 135–145)

## 2020-02-08 NOTE — Progress Notes (Signed)
Per patient request called husband and left a message.

## 2020-02-08 NOTE — Progress Notes (Addendum)
Advanced Heart Failure Rounding Note  PCP-Cardiologist: [name], MD   Subjective:    Feels better today. No longer dyspneic during conversation. Co-ox up to 75% on milrinone.   Diuresing well w/ Lasix gtt and metolazone. -2.7L in UOP yesterday. Wt down 3 lb but remains markedly volume overloaded, CVP 20.   Scr up slightly from 1.77>>1.84. K 3.3  She understands that this is end- stage HF as discussed w/ her in consultation yesterday. Awaiting palliative care consult to discuss [place], comfort measures. Continue IV diuretics to relieve congestion.    Objective:   Weight Range: 61.2 kg Body mass index is 23.9 kg/m.   Vital Signs:   Temp:  [97.4 F (36.3 C)-98 F (36.7 C)] 98 F (36.7 C) (03/25 0755) Pulse Rate:  [84-102] 90 (03/25 0900) Resp:  [17-34] 20 (03/25 0900) BP: (97-123)/(67-91) 99/67 (03/25 0900) SpO2:  [92 %-100 %] 100 % (03/25 0900) Weight:  [61.2 kg-62.5 kg] 61.2 kg (03/25 0208) Last BM Date: 02/06/20  Weight change: Filed Weights   02/07/20 0640 02/08/20 0003 02/08/20 0208  Weight: 62.7 kg 62.5 kg 61.2 kg    Intake/Output:   Intake/Output Summary (Last 24 hours) at 02/08/2020 1206 Last data filed at 02/08/2020 0900 Gross per 24 hour  Intake 1233.6 ml  Output 2325 ml  Net -1091.4 ml      Physical Exam    CVP 20 General:  frail appearing elderly AAF. No resp difficulty HEENT: Normal Neck: Supple. Elevated JVP to ear . Carotids 2+ bilat; no bruits. No lymphadenopathy or thyromegaly appreciated. Cor: PMI nondisplaced. Irregular rhythm (PVCs), mildly tachy rate. No rubs, gallops or murmurs. Lungs: decreased BS at the bases, no wheezing  Abdomen: Soft, nontender, nondistended. No hepatosplenomegaly. No bruits or masses. Good bowel sounds. Extremities: No cyanosis, clubbing, rash, 2+ bilateral LEE up to knees, bilateral ted hoses  Neuro: Alert & orientedx3, cranial nerves grossly intact. moves all 4 extremities w/o difficulty. Affect  pleasant   Telemetry   NSR w/ frequent PVCs, brief runs of NSVT, PVC rate 22-25/hr   Labs    CBC Recent Labs    02/05/20 1221  WBC 3.0*  HGB 12.4  HCT 38.3  MCV 93.0  PLT 063*   Basic Metabolic Panel Recent Labs    02/07/20 0359 02/08/20 0420  NA 134* 135  K 3.8 3.3*  CL 101 99  CO2 23 25  GLUCOSE 119* 139*  BUN 61* 61*  CREATININE 1.77* 1.84*  CALCIUM 9.3 9.3   Liver Function Tests No results for input(s): AST, ALT, ALKPHOS, BILITOT, PROT, ALBUMIN in the last 72 hours. No results for input(s): LIPASE, AMYLASE in the last 72 hours. Cardiac Enzymes No results for input(s): CKTOTAL, CKMB, CKMBINDEX, TROPONINI in the last 72 hours.  BNP: BNP (last 3 results) Recent Labs    11/21/19 1321 02/05/20 1332  BNP 3,536.1* 3,829.9*    ProBNP (last 3 results) Recent Labs    10/20/19 1032 11/07/19 0943  PROBNP 13,688* 12,815*     D-Dimer No results for input(s): DDIMER in the last 72 hours. Hemoglobin A1C No results for input(s): HGBA1C in the last 72 hours. Fasting Lipid Panel No results for input(s): CHOL, HDL, LDLCALC, TRIG, CHOLHDL, LDLDIRECT in the last 72 hours. Thyroid Function Tests No results for input(s): TSH, T4TOTAL, T3FREE, THYROIDAB in the last 72 hours.  Invalid input(s): FREET3  Other results:   Imaging     No results found.   Medications:     Scheduled  Medications: . Chlorhexidine Gluconate Cloth  6 each Topical Daily  . heparin  5,000 Units Subcutaneous Q8H  . levothyroxine  50 mcg Oral Daily  . sodium chloride flush  10-40 mL Intracatheter Q12H  . sodium chloride flush  3 mL Intravenous Once  . sodium chloride flush  3 mL Intravenous Q12H     Infusions: . sodium chloride    . furosemide (LASIX) infusion 12 mg/hr (02/07/20 2208)  . milrinone 0.25 mcg/kg/min (02/07/20 1515)     PRN Medications:  sodium chloride, acetaminophen, albuterol, ALPRAZolam, guaiFENesin-dextromethorphan, ondansetron (ZOFRAN) IV, sodium  chloride flush, sodium chloride flush    Assessment/Plan   1. Acute on Chronic Biventricular Heart Failure w/ Low Output/ Stage D Heart Failure - NICM dating back to 2010. Echo in 2010 showed EF 15-20%. LHC showed normal cors - Echo 11/24/19 w/ EF 25-30% w/ global hypokinesis w/ inferior/inferoseptal akinesis, G2DD but no LVH. RV was mildly enlarged and systolic function moderately reduced. - has known pulmonary sarcoid but no known cardiac involvement  - This is 2nd admission for a/c CHF in last 3 months requiring milrinone - initial co-ox this admit 36%. Improved w/ milrinone 0.25. Co-ox up to 75 % today  - diuresing well on lasix gtt at 12 mg/hr + metolazone 2.5 mg qd - remains markedly volume overloaded. CVP  20 - continue lasix gtt at 12 mg/hr. Scr up slightly today, 1.77>>1.84. BMP in am. If SCr continues to rise, will hold next dose of metolazone.  - Imdur on hold for BP room for IV diuretics - continue milrinone at current rate, 0.25 mcg/kg/min. May need IV amiodarone given ventricular ectopy.Monitor  - continue daily co-ox and CVP monitoring  - overall prognosis seems poor. Would be a poor candidate for LVAD given RV dysfunction  - palliative care team has been consulted to discuss [place]   2. AKI: - likely cardiorenal/ due to low output - continue inotropes/diureics - follow BMP daily   3. Pulmonary Sarcoid - followed by pulmonology     Length of Stay: [address] Ladoris Gene  02/08/2020, 12:06 PM  Advanced Heart Failure Team Pager [phone] (M-F; [place])  Please contact [facility] for night-coverage after hours (4p -7a ) and weekends on [email or web address]  Patient seen and examined with the above-signed Advanced Practice Provider and/or Housestaff. I personally reviewed laboratory data, imaging studies and relevant notes. I independently examined the patient and formulated the important aspects of the plan. I have edited the note to reflect any of my changes or salient  points. I have personally discussed the plan with the patient and/or family.  Improved with milrinone with good diuresis but still extremely tenuous with severe biventricular HF and marked volume overload  General:  Elderly Frail. No resp difficulty HEENT: normal Neck: supple. JVP to earCarotids 2+ bilat; no bruits. No lymphadenopathy or thryomegaly appreciated. Cor: PMI nondisplaced. Tachy + s3 Lungs: clear Abdomen: soft, nontender, nondistended. No hepatosplenomegaly. No bruits or masses. Good bowel sounds. Extremities: no cyanosis, clubbing, rash, 2+  edema Neuro: alert & orientedx3, cranial nerves grossly intact. moves all 4 extremities w/o difficulty. Affect pleasant  She is inotrope dependent and end-stage. Will plan on family meeting today to discuss but I think only option is Hospice,.Would use milrinone while in hospital to help diurese prior to d/c but would advise against home inotropes as they are unlikely to provide any durable benefit.   Glori Bickers, MD  12:27 PM

## 2020-02-09 DIAGNOSIS — Z515 Encounter for palliative care: Secondary | ICD-10-CM

## 2020-02-09 LAB — BASIC METABOLIC PANEL
Anion gap: 10 (ref 5–15)
BUN: 64 mg/dL — ABNORMAL HIGH (ref 8–23)
CO2: 29 mmol/L (ref 22–32)
Calcium: 9.1 mg/dL (ref 8.9–10.3)
Chloride: 95 mmol/L — ABNORMAL LOW (ref 98–111)
Creatinine, Ser: 1.67 mg/dL — ABNORMAL HIGH (ref 0.44–1.00)
GFR calc Af Amer: 32 mL/min — ABNORMAL LOW (ref >60)
GFR calc non Af Amer: 28 mL/min — ABNORMAL LOW (ref >60)
Glucose, Bld: 143 mg/dL — ABNORMAL HIGH (ref 70–99)
Potassium: 3 mmol/L — ABNORMAL LOW (ref 3.5–5.1)
Sodium: 134 mmol/L — ABNORMAL LOW (ref 135–145)

## 2020-02-09 LAB — COOXEMETRY PANEL
Carboxyhemoglobin: 1.8 % — ABNORMAL HIGH (ref 0.5–1.5)
Methemoglobin: 1 % (ref 0.0–1.5)
O2 Saturation: 68.8 %
Total hemoglobin: 11.1 g/dL — ABNORMAL LOW (ref 12.0–16.0)

## 2020-02-09 LAB — MAGNESIUM: Magnesium: 1.7 mg/dL (ref 1.7–2.4)

## 2020-02-09 MED ORDER — AMIODARONE HCL IN DEXTROSE 360-4.14 MG/200ML-% IV SOLN
30.0000 mg/h | INTRAVENOUS | Status: DC
  Administered 2020-02-09 – 2020-02-10 (×3): 30 mg/h via INTRAVENOUS
  Filled 2020-02-09 (×3): qty 200

## 2020-02-09 MED ORDER — SORBITOL 70 % SOLN
30.0000 mL | Freq: Every day | Status: DC
  Administered 2020-02-09 – 2020-02-14 (×6): 30 mL via ORAL
  Filled 2020-02-09 (×6): qty 30

## 2020-02-09 MED ORDER — DOCUSATE SODIUM 50 MG PO CAPS
50.0000 mg | ORAL_CAPSULE | Freq: Every day | ORAL | Status: DC | PRN
  Filled 2020-02-09: qty 1

## 2020-02-09 MED ORDER — MAGNESIUM SULFATE 2 GM/50ML IV SOLN
2.0000 g | Freq: Once | INTRAVENOUS | Status: AC
  Administered 2020-02-09: 2 g via INTRAVENOUS
  Filled 2020-02-09: qty 50

## 2020-02-09 MED ORDER — AMIODARONE LOAD VIA INFUSION
150.0000 mg | Freq: Once | INTRAVENOUS | Status: AC
  Administered 2020-02-09: 12:00:00 150 mg via INTRAVENOUS
  Filled 2020-02-09: qty 83.34

## 2020-02-09 MED ORDER — SENNOSIDES-DOCUSATE SODIUM 8.6-50 MG PO TABS
1.0000 | ORAL_TABLET | Freq: Every day | ORAL | Status: DC
  Administered 2020-02-09 – 2020-02-12 (×3): 1 via ORAL
  Filled 2020-02-09 (×4): qty 1

## 2020-02-09 MED ORDER — POTASSIUM CHLORIDE CRYS ER 20 MEQ PO TBCR
40.0000 meq | EXTENDED_RELEASE_TABLET | ORAL | Status: AC
  Administered 2020-02-09 (×3): 40 meq via ORAL
  Filled 2020-02-09 (×3): qty 2

## 2020-02-09 MED ORDER — AMIODARONE HCL IN DEXTROSE 360-4.14 MG/200ML-% IV SOLN
60.0000 mg/h | INTRAVENOUS | Status: AC
  Administered 2020-02-09: 12:00:00 60 mg/h via INTRAVENOUS
  Filled 2020-02-09: qty 200

## 2020-02-09 MED ORDER — MAGNESIUM HYDROXIDE 400 MG/5ML PO SUSP
15.0000 mL | Freq: Every day | ORAL | Status: DC | PRN
  Administered 2020-02-09: 15 mL via ORAL
  Filled 2020-02-09: qty 30

## 2020-02-09 NOTE — Plan of Care (Signed)

## 2020-02-09 NOTE — Consult Note (Signed)
Consultation Note Date: 02/09/2020   Crystal [name] Name: Crystal [name]  DOB: 26-Dec-1933  MRN: [id number]  Age / Sex: 86 y.o., female  PCP: Crystal Lukes, MD Referring Physician: Lelon Perla, MD  Reason for Consultation: Establishing goals of care, Hospice Evaluation and Psychosocial/spiritual support  HPI/Crystal [name] Profile: 86 y.o. female  with past medical history of pulmonary sarcoidosis, hypothyroidism and CHF who was admitted on 02/05/2020 with acute on chronic mixed HF with low output.  The Advanced Heart Failure team has been diuresing her.  Her blood pressure has remained soft.  She is currently on milrinone.   Per Cardiology she is a poor LVAD candidate.    Clinical Assessment and Goals of Care:  I have reviewed medical records including EPIC notes, labs and imaging, received report from the care team, examined the Crystal [name] and met at bedside with her [name] Crystal [name] and son Crystal [name] to discuss diagnosis prognosis, Crystal [name], Crystal [name] wishes, disposition and options.  I introduced Palliative Medicine as specialized medical care for people living with serious illness. It focuses on providing relief from the symptoms and stress of a serious illness.   We discussed a brief life review of the Crystal [name].  Crystal [name] was born and raised in [place].  She met her [name] in high school and they have been together since.  They have been married 19 years.  They had 8 children, 3 of whom have passed away.  Her eldest son Crystal [name] and daughter Crystal [name] both worked for [employer] and live in [place].  They help their parents when they need assistance.  Crystal [name] Crystal [name] is a Theme park manager and is her care taker.  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  She and her family loved to hike and were quite active until approximately 1 year ago.  Recently Crystal [name] has had difficulty with fluid  accumulation in her legs that greatly effected her mobility.    We discussed her current illness and what it means in the larger context of her on-going co-morbidities. Crystal [name] is a very pleasant and simple person she did not seem to understand the severity of her heart failure.  She stated she just wanted to go home to work in her garden outside.  Likewise her [name] Crystal [name] and son Crystal [name] were somewhat surprised when I mentioned possibly receiving support from Hospice in their home.  We talked about the inability to keep the fluid off long term.  Her symptoms are highly likely to return and worsen.     Dr. Haroldine [name] joined our meeting and gave a more detailed explanation of Crystal [name]'s condition to [name]  He offered the family a choice of continuing to pursue HF treatment vs pursuing Hospice care in their home.  Advanced directives, concepts specific to code status, artifical feeding and hydration, and rehospitalization were considered and discussed.  With regard to code status Crystal [name] stated that she wanted to be resuscitated in order to spend as much time as possible with her family.  After Crystal [name] arrived I had the code status discussion again with them.   Crystal [name] seemed to understand and we will discuss it further when I meet with them again tomorrow.  Hospice and Palliative Care services outpatient were explained and offered.  Questions and concerns were addressed.  The family was encouraged to call with questions or concerns.   Primary Decision Maker:  Crystal [name]  Supported by her [name] Crystal [name].    SUMMARY OF RECOMMENDATIONS    Hard Choices booklet was provided to Crystal [name].   PMT will follow up Saturday or Sunday with Crystal [name] and family. If Crystal [name] opts for Hospice a TOC order will need to be placed. If she opts to continue treatment I recommend that she be followed by Palliative outpatient. Will request PT for ambulation - Crystal [name] has not been out of bed since  admission.  Code Status/Advance Care Planning:  Full code   Symptom Management:   Per primary  Additional Recommendations (Limitations, Scope, Preferences):  Full Scope Treatment  Psycho-social/Spiritual:   Desire for further Chaplaincy support: [name] is a Theme park manager, but would likely appreciate support  Prognosis: likely weeks to months.    Discharge Planning: To Be Determined      Primary Diagnoses: Present on Admission: . Acute on chronic combined systolic and diastolic CHF (congestive heart failure) ([place])   I have reviewed the medical record, interviewed the Crystal [name] and family, and examined the Crystal [name]. The following aspects are pertinent.  Past Medical History:  Diagnosis Date  . Anemia 08/21/2017  . Anxiety   . Cardiomyopathy    non ischemic NL cos on cath 08/2009, EF to 15-20%, Her follow up  2D echo  in 10/2009 showed impoved EFo 35-40%  . CHF (congestive heart failure) ([place])   . Cough   . Depression   . Dyspnea   . Edema of leg   . Full dentures   . Hypercalcemia 04/20/2017  . Hypertension   . Hypothyroid   . Lung mass   . Medicare annual wellness visit, subsequent 12/14/2014   Follows with Dr Marin Olp Follows with Dr Deatra Ina, gastroenterology, last colonoscopy in 2012, repeat in 2017 Last Pap 2011, always normal, no need for repeat Last MGM in 2011, no concerns, declines for now Follows with Dr Stanford Breed of cardiology   . Non Hodgkin's lymphoma ([place]) 11/17/1999   -Dr. Marin Olp  . Pleural effusion   . Vitamin D deficiency 10/17/2015  . Wears glasses    Social History   Socioeconomic History  . Marital status: Married    Spouse name: Crystal [name]  . Number of children: 5  . Years of education: Not on file  . Highest education [name]: Not on file  Occupational History  . Occupation: retired  Tobacco Use  . Smoking status: Never Smoker  . Smokeless tobacco: Never Used  Substance and Sexual Activity  . Alcohol use: Never    Alcohol/week: 0.0 standard drinks   . Drug use: Never  . Sexual activity: Yes    Comment: lives with [name], no dietary restrictions, minimizes dairy  Other Topics Concern  . Not on file  Social History Narrative   The Crystal [name] is married ( [name] Crystal Ramanathan)  She just recently celebrated     her 52nd anniversary.  She has five children.  There is no active     tobacco or alcohol use history.          Smoking Status:  never   Caffeine use/day:  None   Does Crystal [name]  Exercise:  yes   Social Determinants of Health   Financial Resource Strain:   . Difficulty of Paying Living Expenses:   Food Insecurity:   . Worried About Charity fundraiser in the Last Year:   . Arboriculturist in the Last Year:   Transportation Needs:   . Film/video editor (Medical):   Marland Kitchen Lack of Transportation (Non-Medical):   Physical Activity:   . Days of Exercise per Week:   . Minutes of Exercise per Session:   Stress:   . Feeling of Stress :   Social Connections:   . Frequency of Communication with Friends and Family:   . Frequency of Social Gatherings with Friends and Family:   . Attends Religious Services:   . Active Member of Clubs or Organizations:   . Attends Archivist Meetings:   Marland Kitchen Marital Status:    Family History  Problem Relation Age of Onset  . Diabetes Mother        deceased secondary to diabetes  . Hypertension Mother   . Vision loss Mother   . Pneumonia Father        died age 79 due to complications of pneumonia  . Colon polyps Father   . Liver disease Daughter   . Sarcoidosis Daughter   . Diabetes Sister   . Colon cancer Brother 46  . Cancer Brother   . Obesity Son   . Diabetes Brother   . Kidney disease Brother   . Diabetes Sister   . Sarcoidosis Sister   . Arthritis Sister   . Arthritis Sister   . Diabetes Sister   . Arthritis Sister   . Diabetes Sister   . Hypertension Daughter   . Hypertension Daughter   . Other Other        no obvious premature cardiovascular disease or familial  cardiomyopathy is noted  . Alcohol abuse Neg Hx   . Heart disease Neg Hx    Scheduled Meds: . Chlorhexidine Gluconate Cloth  6 each Topical Daily  . heparin  5,000 Units Subcutaneous Q8H  . levothyroxine  50 mcg Oral Daily  . potassium chloride  40 mEq Oral Q4H  . senna-docusate  1 tablet Oral QHS  . sodium chloride flush  10-40 mL Intracatheter Q12H  . sodium chloride flush  3 mL Intravenous Once  . sodium chloride flush  3 mL Intravenous Q12H  . sorbitol  30 mL Oral Daily   Continuous Infusions: . sodium chloride    . amiodarone 30 mg/hr (02/09/20 1831)  . furosemide (LASIX) infusion 12 mg/hr (02/09/20 0156)  . milrinone 0.25 mcg/kg/min (02/09/20 0156)   PRN Meds:.sodium chloride, acetaminophen, albuterol, ALPRAZolam, guaiFENesin-dextromethorphan, ondansetron (ZOFRAN) IV, sodium chloride flush, sodium chloride flush Allergies  Allergen Reactions  . Hydralazine Hcl Itching and Other (See Comments)    Hypotension and caused SEVERE ITCHING  . Lisinopril Swelling and Other (See Comments)    Facial and lip swelling  . Iohexol Itching         Review of Systems Crystal [name] complained of constipation, weakness and fatigue  Physical Exam  Very pleasant elderly female, awake, alert, orientated.  Somewhat childlike affect Lower ext with 2+ edema  Vital Signs: BP 91/74   Pulse 95   Temp 98 F (36.7 C) (Oral)   Resp (!) 25   Ht '5\' 3"'  (1.6 m)   Wt 60.4 kg   SpO2 97%   BMI 23.59 kg/m  Pain Scale: 0-10   Pain Score:  0-No pain   SpO2: SpO2: 97 % O2 Device:SpO2: 97 % O2 Flow Rate: .O2 Flow Rate (L/min): 2 L/min  IO: Intake/output summary:   Intake/Output Summary (Last 24 hours) at 02/09/2020 1937 Last data filed at 02/09/2020 1800 Gross per 24 hour  Intake 1312.93 ml  Output 2600 ml  Net -1287.07 ml    LBM: Last BM Date: 02/06/20 Baseline Weight: Weight: 64.9 kg Most recent weight: Weight: 60.4 kg     Palliative Assessment/Data: 30%     Time In: 3:00 Time Out:  4:15 Time Total: 75 min Visit consisted of counseling and education dealing with the complex and emotionally intense issues surrounding the need for palliative care and symptom management in the setting of serious and potentially life-threatening illness. Greater than 50%  of this time was spent counseling and coordinating care related to the above assessment and plan.  Signed by: Florentina Jenny, PA-C Palliative Medicine  Please contact Palliative Medicine Team phone at [phone] for questions and concerns.  For individual provider: See Shea Evans

## 2020-02-09 NOTE — Progress Notes (Signed)
Chronic with improvement

## 2020-02-09 NOTE — Progress Notes (Addendum)
Advanced Heart Failure Rounding Note  PCP-Cardiologist: [name], MD   Subjective:    Remains on milrinone 0.25 mcg + lasix 12 mg per hour. Negative 2.3 liters   CO-OX 69%   Denies SOB.    Objective:   Weight Range: 60.4 kg Body mass index is 23.59 kg/m.   Vital Signs:   Temp:  [97.7 F (36.5 C)-98.8 F (37.1 C)] 97.7 F (36.5 C) (03/26 0445) Pulse Rate:  [90-102] 90 (03/26 0445) Resp:  [16-26] 16 (03/26 0445) BP: (83-116)/(54-93) 114/93 (03/26 0445) SpO2:  [93 %-100 %] 93 % (03/26 0445) Weight:  [60.4 kg] 60.4 kg (03/26 0545) Last BM Date: 02/06/20  Weight change: Filed Weights   02/08/20 0003 02/08/20 0208 02/09/20 0545  Weight: 62.5 kg 61.2 kg 60.4 kg    Intake/Output:   Intake/Output Summary (Last 24 hours) at 02/09/2020 1042 Last data filed at 02/09/2020 0859 Gross per 24 hour  Intake 1086.79 ml  Output 2900 ml  Net -1813.21 ml      Physical Exam    CVP 12  General:  No resp difficulty HEENT: normal anicteric  Neck: supple. JVP 11-12 no JVD. Carotids 2+ bilat; no bruits. No lymphadenopathy or thryomegaly appreciated. Cor: PMI nondisplaced. Irregular rate & rhythm. No rubs, or murmurs. +S3  Lungs: clear no wheeze  Abdomen: soft, nontender, nondistended. No hepatosplenomegaly. No bruits or masses. Good bowel sounds. Extremities: no cyanosis, clubbing, rash, R and LLE 2+ RUE PICC  Neuro: alert & oriented x 3, cranial nerves grossly intact. moves all 4 extremities w/o difficulty. Affect pleasant   Telemetry  NSR with frequent PVCs.  Personally reviewed  Labs    CBC No results for input(s): WBC, NEUTROABS, HGB, HCT, MCV, PLT in the last 72 hours. Basic Metabolic Panel Recent Labs    02/08/20 0420 02/09/20 0432  NA 135 134*  K 3.3* 3.0*  CL 99 95*  CO2 25 29  GLUCOSE 139* 143*  BUN 61* 64*  CREATININE 1.84* 1.67*  CALCIUM 9.3 9.1   Liver Function Tests No results for input(s): AST, ALT, ALKPHOS, BILITOT, PROT, ALBUMIN in the  last 72 hours. No results for input(s): LIPASE, AMYLASE in the last 72 hours. Cardiac Enzymes No results for input(s): CKTOTAL, CKMB, CKMBINDEX, TROPONINI in the last 72 hours.  BNP: BNP (last 3 results) Recent Labs    11/21/19 1321 02/05/20 1332  BNP 3,536.1* 3,829.9*    ProBNP (last 3 results) Recent Labs    10/20/19 1032 11/07/19 0943  PROBNP 13,688* 12,815*     D-Dimer No results for input(s): DDIMER in the last 72 hours. Hemoglobin A1C No results for input(s): HGBA1C in the last 72 hours. Fasting Lipid Panel No results for input(s): CHOL, HDL, LDLCALC, TRIG, CHOLHDL, LDLDIRECT in the last 72 hours. Thyroid Function Tests No results for input(s): TSH, T4TOTAL, T3FREE, THYROIDAB in the last 72 hours.  Invalid input(s): FREET3  Other results:   Imaging    No results found.   Medications:     Scheduled Medications: . Chlorhexidine Gluconate Cloth  6 each Topical Daily  . heparin  5,000 Units Subcutaneous Q8H  . levothyroxine  50 mcg Oral Daily  . sodium chloride flush  10-40 mL Intracatheter Q12H  . sodium chloride flush  3 mL Intravenous Once  . sodium chloride flush  3 mL Intravenous Q12H    Infusions: . sodium chloride    . furosemide (LASIX) infusion 12 mg/hr (02/09/20 0156)  . milrinone 0.25 mcg/kg/min (02/09/20 0156)  PRN Medications: sodium chloride, acetaminophen, albuterol, ALPRAZolam, docusate sodium, guaiFENesin-dextromethorphan, magnesium hydroxide, ondansetron (ZOFRAN) IV, sodium chloride flush, sodium chloride flush    Assessment/Plan   1. Acute on Chronic Biventricular Heart Failure w/ Low Output/ Stage D Heart Failure - NICM dating back to 2010. Echo in 2010 showed EF 15-20%. LHC showed normal cors - Echo 11/24/19 w/ EF 25-30% w/ global hypokinesis w/ inferior/inferoseptal akinesis, G2DD but no LVH. RV was mildly enlarged and systolic function moderately reduced. - has known pulmonary sarcoid but no known cardiac involvement  -  This is 2nd admission for a/c CHF in last 3 months requiring milrinone - initial co-ox this admit 36%. Improved w/ milrinone 0.25. Co-ox up to 69%  - CVP 12 today.  Continue lasix drip 12 mg per hour. Supp K.  - No bb/arb with soft BP.   -Add amio drip with frequent PVCs. ? PVC induces.  - overall prognosis seems poor. Would be a poor candidate for LVAD given RV dysfunction  - palliative care team has been consulted to discuss [place]   2. AKI: Baseline creatinine 1.3-1.4  -Creatinine 1.8>1.6  - Follow creatinine   3. Pulmonary Sarcoid - followed by pulmonology   4. PVC Frequent PVCs noted > 20 per hour.  - Supp K and check Mag.  - Add amiodarone drip to suppress PVCs. Cut back milrinone 0.125 mcg.   5. Hypokalemia  K low. Supp K.   Length of Stay: 4  Amy Clegg, NP  02/09/2020, 10:42 AM  Advanced Heart Failure Team Pager [phone] (M-F; 7a - 4p)  Please contact [facility] for night-coverage after hours (4p -7a ) and weekends on [email or web address]  Patient seen and examined with the above-signed Advanced Practice Provider and/or Housestaff. I personally reviewed laboratory data, imaging studies and relevant notes. I independently examined the patient and formulated the important aspects of the plan. I have edited the note to reflect any of my changes or salient points. I have personally discussed the plan with the patient and/or family.  She looks and feels better today with milrinone support.   However long-term options remain poor.   Long discussion with her and her family (with Palliative team present) today about palliative care and hospice options. Will continue to diurese with milrinone support. Once fully diuresed will wean inotropes and see how she feels.   Agree with amio to suppress PVCs.  Glori Bickers, MD  5:56 PM

## 2020-02-10 LAB — CBC
HCT: 31.9 % — ABNORMAL LOW (ref 36.0–46.0)
Hemoglobin: 10.6 g/dL — ABNORMAL LOW (ref 12.0–15.0)
MCH: 29.9 pg (ref 26.0–34.0)
MCHC: 33.2 g/dL (ref 30.0–36.0)
MCV: 90.1 fL (ref 80.0–100.0)
Platelets: 126 10*3/uL — ABNORMAL LOW (ref 150–400)
RBC: 3.54 MIL/uL — ABNORMAL LOW (ref 3.87–5.11)
RDW: 15.9 % — ABNORMAL HIGH (ref 11.5–15.5)
WBC: 3.1 10*3/uL — ABNORMAL LOW (ref 4.0–10.5)
nRBC: 0 % (ref 0.0–0.2)

## 2020-02-10 LAB — BASIC METABOLIC PANEL
Anion gap: 10 (ref 5–15)
BUN: 63 mg/dL — ABNORMAL HIGH (ref 8–23)
CO2: 26 mmol/L (ref 22–32)
Calcium: 8.3 mg/dL — ABNORMAL LOW (ref 8.9–10.3)
Chloride: 97 mmol/L — ABNORMAL LOW (ref 98–111)
Creatinine, Ser: 1.71 mg/dL — ABNORMAL HIGH (ref 0.44–1.00)
GFR calc Af Amer: 31 mL/min — ABNORMAL LOW (ref >60)
GFR calc non Af Amer: 27 mL/min — ABNORMAL LOW (ref >60)
Glucose, Bld: 173 mg/dL — ABNORMAL HIGH (ref 70–99)
Potassium: 3.4 mmol/L — ABNORMAL LOW (ref 3.5–5.1)
Sodium: 133 mmol/L — ABNORMAL LOW (ref 135–145)

## 2020-02-10 LAB — COOXEMETRY PANEL
Carboxyhemoglobin: 1.7 % — ABNORMAL HIGH (ref 0.5–1.5)
Methemoglobin: 0.9 % (ref 0.0–1.5)
O2 Saturation: 61.3 %
Total hemoglobin: 13.1 g/dL (ref 12.0–16.0)

## 2020-02-10 MED ORDER — POTASSIUM CHLORIDE CRYS ER 20 MEQ PO TBCR
40.0000 meq | EXTENDED_RELEASE_TABLET | Freq: Once | ORAL | Status: AC
  Administered 2020-02-10: 40 meq via ORAL
  Filled 2020-02-10: qty 2

## 2020-02-10 MED ORDER — POTASSIUM CHLORIDE CRYS ER 20 MEQ PO TBCR
20.0000 meq | EXTENDED_RELEASE_TABLET | ORAL | Status: AC
  Administered 2020-02-10 – 2020-02-11 (×3): 20 meq via ORAL
  Filled 2020-02-10 (×3): qty 1

## 2020-02-10 MED ORDER — AMIODARONE HCL 200 MG PO TABS
200.0000 mg | ORAL_TABLET | Freq: Every day | ORAL | Status: DC
  Administered 2020-02-11 – 2020-02-13 (×3): 200 mg via ORAL
  Filled 2020-02-10 (×3): qty 1

## 2020-02-10 MED ORDER — MIDODRINE HCL 5 MG PO TABS
10.0000 mg | ORAL_TABLET | Freq: Three times a day (TID) | ORAL | Status: DC
  Administered 2020-02-10 – 2020-02-14 (×12): 10 mg via ORAL
  Filled 2020-02-10 (×11): qty 2

## 2020-02-10 MED ORDER — MIDODRINE HCL 5 MG PO TABS
5.0000 mg | ORAL_TABLET | Freq: Three times a day (TID) | ORAL | Status: DC
  Administered 2020-02-10: 5 mg via ORAL
  Filled 2020-02-10 (×2): qty 1

## 2020-02-10 NOTE — Progress Notes (Signed)
Acute on chronic Patient status is improving with Medications adjustments.

## 2020-02-10 NOTE — Significant Event (Signed)
Patient is oriented sleepy after "rough night with moving bowels sedative given, b/p soft baseline and in 70/50s MAP 63 Legs swollen and warm this morning patient heart/failure clinic and [place] [facility].

## 2020-02-10 NOTE — Significant Event (Signed)
Spoke  patient sleepy oriented 1-2 b/p- improving from 70/50 to 90/60s after lasix and Milrinone being paused with Rapid Response since about 0945. Spoke with Heart Team and plan with Adjust Drip rates per Mancel Bale NP and Dr. Haroldine Laws aware to round later.

## 2020-02-10 NOTE — Progress Notes (Signed)
Patient is awake in bed chair postiion to eat with no distress. Started midodrine to improve b/p.

## 2020-02-10 NOTE — Progress Notes (Signed)
Daily Progress Note   Patient Name: Crystal [name]       Date: 02/10/2020 DOB: 1934-06-14  Age: 86 y.o. MRN#: [id number] Attending Physician: Lelon Perla, MD Primary Care Physician: Mosie Lukes, MD Admit Date: 02/05/2020  Reason for Consultation/Follow-up: Establishing goals of care and Psychosocial/spiritual support  Subjective: Crystal [name] reports a difficult night last night.  She states she was coughing a lot and felt poorly.  She is looking forward to her husband coming up to visit late this afternoon.  I spoke with son Crystal [name] on the telephone.  I explained that his mother's blood pressure had been very low - this was concerning.  The recommendation at this point is to consider Hospice in the home or Hospice facility as their is some concern she may be too tenuous to make it home.  Bruce appreciated the phone call and stated he would speak with his father.  Bruce and his sister Crystal [name] are both heavily involved in the care of their parent.   Assessment: Crystal [name] with biventricular heart failure with low output.  She is hypotensive and overall not improved with medical therapy.   Patient Profile/HPI:  86 y.o. [name]  with past medical history of pulmonary sarcoidosis, hypothyroidism and CHF who was admitted on 02/05/2020 with acute on chronic mixed HF with low output.  The Advanced Heart Failure team has been diuresing her.  Her blood pressure has remained soft.  She is currently on milrinone.   Per Cardiology she is a poor LVAD candidate.      Length of Stay: 5  Current Medications: Scheduled Meds:  . amiodarone  200 mg Oral Daily  . Chlorhexidine Gluconate Cloth  6 each Topical Daily  . heparin  5,000 Units Subcutaneous Q8H  . levothyroxine  50 mcg  Oral Daily  . midodrine  10 mg Oral TID WC  . potassium chloride  20 mEq Oral Q4H  . senna-docusate  1 tablet Oral QHS  . sodium chloride flush  10-40 mL Intracatheter Q12H  . sodium chloride flush  3 mL Intravenous Once  . sodium chloride flush  3 mL Intravenous Q12H  . sorbitol  30 mL Oral Daily    Continuous Infusions: . sodium chloride    . furosemide (LASIX) infusion Stopped (02/10/20 0945)  .  milrinone 0.125 mcg/kg/min (02/10/20 1042)    PRN Meds: sodium chloride, acetaminophen, albuterol, ALPRAZolam, guaiFENesin-dextromethorphan, ondansetron (ZOFRAN) IV, sodium chloride flush, sodium chloride flush  Physical Exam        Frail [name], awake, alert, orientated, some what child like affect.  Clear speech. No acute distress.  Vital Signs: BP (!) 83/61 (BP Location: Left Arm) Comment: Gave Midodrine   Pulse 62   Temp 97.7 F (36.5 C)   Resp (!) 22   Ht 5\' 3"  (1.6 m)   Wt 61.4 kg   SpO2 100%   BMI 23.98 kg/m  SpO2: SpO2: 100 % O2 Device: O2 Device: Room Air O2 Flow Rate: O2 Flow Rate (L/min): 2 L/min  Intake/output summary:   Intake/Output Summary (Last 24 hours) at 02/10/2020 1721 Last data filed at 02/10/2020 1000 Gross per 24 hour  Intake 538.75 ml  Output 1400 ml  Net -861.25 ml   LBM: Last BM Date: 02/06/20 Baseline Weight: Weight: 64.9 kg Most recent weight: Weight: 61.4 kg       Palliative Assessment/Data:  20%      Patient Active Problem List   Diagnosis Date Noted  . Palliative care encounter   . Acute on chronic combined systolic and diastolic CHF (congestive heart failure) ([place]) 02/05/2020  . Edema 01/31/2020  . Constipation 12/17/2019  . NSVT (nonsustained ventricular tachycardia) ([place])   . CHF (congestive heart failure) ([place]) 11/21/2019  . Hypoxia 05/29/2019  . Mediastinal adenopathy 05/24/2019  . Acute on chronic respiratory failure with hypoxemia ([place]) 01/12/2019  . CKD stage G3a/A1, GFR 45-59 and albumin creatinine ratio <30 mg/g  11/30/2018  . Acute on chronic combined systolic (congestive) and diastolic (congestive) heart failure ([place]) 11/29/2018  . Cavitating mass in left upper lung lobe 06/29/2018  . Hilar adenopathy 06/29/2018  . Sarcoidosis 06/22/2018  . IBS (irritable bowel syndrome) 02/21/2018  . Anemia 08/21/2017  . Hypercalcemia 04/20/2017  . Diffuse large B-cell lymphoma of lymph nodes of neck ([place]) 01/21/2017  . Mitral valve regurgitation, Moderate to severe 11/26/2015  . Chronic systolic heart failure ([place]) 11/26/2015  . Hyperlipidemia, mixed 10/17/2015  . Vitamin D deficiency 10/17/2015  . History of angioedema 06/11/2015  . Hematuria 06/11/2015  . Preventative health care 12/14/2014  . Hyperglycemia 03/24/2012  . Diverticulosis 10/01/2011  . Thrombocytopenia ([place]) 10/02/2010  . Hypothyroidism 01/24/2010  . RHEUMATOID FACTOR, POSITIVE 01/24/2010  . Non Hodgkin's lymphoma ([place]) 01/09/2010  . RAYNAUDS SYNDROME 01/09/2010  . Anxiety state 09/20/2009  . Essential hypertension 09/20/2009  . Nonischemic cardiomyopathy ([place]) 09/20/2009  . Congestive heart failure ([place]) 09/20/2009    Palliative Care Plan    Recommendations/Plan:  Family considering question of code status  PMT to follow up with family tomorrow regarding a shift in goal from stabilization to comfort.  Goals of Care and Additional Recommendations:  Limitations on Scope of Treatment: Full Scope Treatment  Code Status:  Full code  Prognosis:   < 2 weeks   Discharge Planning:  To Be Determined home with hospice vs hospice facility.  Care plan was discussed with patient, son, Dr. Haroldine Laws  Thank you for allowing the Palliative Medicine Team to assist in the care of this patient.  Total time spent:  25 min.     Greater than 50%  of this time was spent counseling and coordinating care related to the above assessment and plan.  Florentina Jenny, PA-C Palliative Medicine  Please contact Palliative MedicineTeam  phone at [phone] for questions and concerns between 7 am -  7 pm.   Please see AMION for individual provider pager numbers.

## 2020-02-10 NOTE — Significant Event (Signed)
Rapid Response Event Note  Overview: Follow Up - Hypotension  I called at 1125 for an updated, per nurse, she was able to restart the Milrinone to 0.150mcg/kg/min and administered Midodrine 5 mg. SBP improved to 90s.   Crystal [name]

## 2020-02-10 NOTE — Progress Notes (Addendum)
Progress Note  Patient Name: Crystal [name] Date of Encounter: 02/10/2020  Primary Cardiologist: [name], MD   Subjective   Called by RN with patient's blood pressures in the 70s this morning.   Had terrible night. SOB and coughing   On lasix gtt. I/O - 786 but weight up 1-2 pounds   Feels very weak currently. Remains on milrinone 0.25. Co-ox 61% CVP 22   Inpatient Medications    Scheduled Meds: . Chlorhexidine Gluconate Cloth  6 each Topical Daily  . heparin  5,000 Units Subcutaneous Q8H  . levothyroxine  50 mcg Oral Daily  . midodrine  5 mg Oral TID WC  . senna-docusate  1 tablet Oral QHS  . sodium chloride flush  10-40 mL Intracatheter Q12H  . sodium chloride flush  3 mL Intravenous Once  . sodium chloride flush  3 mL Intravenous Q12H  . sorbitol  30 mL Oral Daily   Continuous Infusions: . sodium chloride    . amiodarone 30 mg/hr (02/10/20 0238)  . furosemide (LASIX) infusion 12 mg/hr (02/09/20 2128)  . milrinone 0.25 mcg/kg/min (02/09/20 0156)   PRN Meds: sodium chloride, acetaminophen, albuterol, ALPRAZolam, guaiFENesin-dextromethorphan, ondansetron (ZOFRAN) IV, sodium chloride flush, sodium chloride flush   Vital Signs    Vitals:   02/10/20 0957 02/10/20 1000 02/10/20 1003 02/10/20 1004  BP: (!) 76/54 (!) 83/54 (!) 78/58 (!) 79/60  Pulse: 80 77 81 76  Resp:      Temp:      TempSrc:      SpO2: 100% 98% 100% 100%  Weight:      Height:        Intake/Output Summary (Last 24 hours) at 02/10/2020 1035 Last data filed at 02/10/2020 0943 Gross per 24 hour  Intake 732.66 ml  Output 1900 ml  Net -1167.34 ml   Last 3 Weights 02/10/2020 02/09/2020 02/08/2020  Weight (lbs) 135 lb 5.8 oz 133 lb 2.5 oz 134 lb 14.4 oz  Weight (kg) 61.4 kg 60.4 kg 61.19 kg      Telemetry    SR 70-80s - Personally Reviewed  ECG    No new tracing this morning.   Physical Exam  Frail older AAF, sitting up in bed.  General:  Weak appearing. No resp difficulty HEENT:  normal Neck: supple. JVP to ear. Carotids 2+ bilat; no bruits. No lymphadenopathy or thryomegaly appreciated. Cor: PMI nondisplaced. Regular tachy  +s3 Lungs: clear Abdomen: soft, nontender, nondistended. No hepatosplenomegaly. No bruits or masses. Good bowel sounds. Extremities: no cyanosis, clubbing, rash, 1+ edema cool  Neuro: alert & orientedx3, cranial nerves grossly intact. moves all 4 extremities w/o difficulty. Affect pleasant   Labs    High Sensitivity Troponin:   Recent Labs  Lab 02/05/20 1221 02/05/20 1430  TROPONINIHS 14 13      Chemistry Recent Labs  Lab 02/08/20 0420 02/09/20 0432 02/10/20 0254  NA 135 134* 133*  K 3.3* 3.0* 3.4*  CL 99 95* 97*  CO2 25 29 26   GLUCOSE 139* 143* 173*  BUN 61* 64* 63*  CREATININE 1.84* 1.67* 1.71*  CALCIUM 9.3 9.1 8.3*  GFRNONAA 25* 28* 27*  GFRAA 29* 32* 31*  ANIONGAP 11 10 10      Hematology Recent Labs  Lab 02/05/20 1221 02/10/20 0254  WBC 3.0* 3.1*  RBC 4.12 3.54*  HGB 12.4 10.6*  HCT 38.3 31.9*  MCV 93.0 90.1  MCH 30.1 29.9  MCHC 32.4 33.2  RDW 16.5* 15.9*  PLT 111* 126*  BNP Recent Labs  Lab 02/05/20 1332  BNP 3,829.9*     DDimer No results for input(s): DDIMER in the last 168 hours.   Radiology    No results found.  Cardiac Studies   N/a   Patient Profile     86 y.o. female  with PMH of non-ischemic cardiomyopathy, HTN, hypothyroidism, sarcoidosis, and anxiety, who presented for management of her CHF.  Assessment & Plan    1. Acute on Chronic Biventricular Heart Failure w/ Low Output/ Stage D Heart Failure - NICM dating back to 2010. Echo in 2010 showed EF 15-20%. LHC showed normal cors - Echo 11/24/19 w/ EF25-30% w/ global hypokinesis w/ inferior/inferoseptal akinesis, G2DD but no LVH. RV was mildly enlarged and systolic function moderately reduced. - has known pulmonary sarcoid but no known cardiac involvement  - This is 2nd admission for a/c CHF in last 3 months requiring  milrinone - initial co-ox this admit 36%. Improved w/ milrinone 0.25. Co-ox is 61% this morning - CVP 19 today per RN. Blood pressures are soft this morning, confirmed with manual check. Lasix drip stopped. Milrinone reduced to 0.125 and midodrine 5mg  TID added.  - No bb/arb with soft BP.   - remains amio drip with PVCs burden much improved. - overall prognosis seems poor. Considered a poor candidate for LVAD given RV dysfunction  - palliative care team saw yesterday, remains full code/full scope of treatment. PMT to follow up this weekend.  2. AKI: Baseline creatinine 1.3-1.4  -Creatinine 1.8>1.6>1.71  - Follow creatinine   3. Pulmonary Sarcoid - followed by pulmonology  4. PVC Frequent PVCs noted > 20 per hour.  - Supp K and Mag 1.7 supp yesterday. Recheck am. - amiodarone drip to suppress PVCs. Cut back milrinone 0.125 mcg.   5. Hypokalemia  K low. Supp K.   For questions or updates, please contact [place] Please consult [email or web address] for contact info under        Signed, Reino Bellis, NP  02/10/2020, 10:35 AM    Patient seen and examined with the above-signed Advanced Practice Provider and/or Housestaff. I personally reviewed laboratory data, imaging studies and relevant notes. I independently examined the patient and formulated the important aspects of the plan. I have edited the note to reflect any of my changes or salient points. I have personally discussed the plan with the patient and/or family.  She is failing medical therapy. Much worse today despite milrinone support. CVP markedly elevated. More SOB. And now BP low. Options very limited.   Suspect she is truly end-stage and will need inpatient hospice.   Will restart lasix gtt. Drop milrinone to 0.125. Add midodrine for BP support.   May need to switch to comfort care soon. D/w Palliative Care team .  Glori Bickers, MD  3:56 PM

## 2020-02-10 NOTE — Significant Event (Addendum)
Rapid Response Event Note  Overview: Hypotension  Initial Focused Assessment: Called to bedside for hypotension, per nurse, SBP is in the 70s. Upon arrival, patient was alert and oriented, quite pleasant - she endorsed that she feels much better now. She informed that last night, she experienced a good bit of shortness of breath and she was miserable but this morning she felt good. Not in acute distress, skin warm and dry, + 1 edema in legs bilaterally, lung sounds - clear in all fields, HR in the 80s, SBP 70-80s, MAP 60-70s, 99% on RA, RR 16-20. Patient denies CP/SOB, denies dizziness, and overall feels better. CVP 17-19. Manual BP checked and it correlated with automatic BP  Interventions: - Paused Lasix and Milrinone Infusions until CARDS MD calls back with orders.   Plan of Care: -- I spoke with CARDS APP, updated her, informed her that the infusions of Lasix and Milrinone were paused for now.  -- Monitor VS -- Rest per MD -- GOCs?   Event Summary:  Call Time [place]   Braya Habermehl R

## 2020-02-11 LAB — BASIC METABOLIC PANEL
Anion gap: 8 (ref 5–15)
BUN: 69 mg/dL — ABNORMAL HIGH (ref 8–23)
CO2: 27 mmol/L (ref 22–32)
Calcium: 8.9 mg/dL (ref 8.9–10.3)
Chloride: 94 mmol/L — ABNORMAL LOW (ref 98–111)
Creatinine, Ser: 2 mg/dL — ABNORMAL HIGH (ref 0.44–1.00)
GFR calc Af Amer: 26 mL/min — ABNORMAL LOW (ref >60)
GFR calc non Af Amer: 22 mL/min — ABNORMAL LOW (ref >60)
Glucose, Bld: 113 mg/dL — ABNORMAL HIGH (ref 70–99)
Potassium: 5 mmol/L (ref 3.5–5.1)
Sodium: 129 mmol/L — ABNORMAL LOW (ref 135–145)

## 2020-02-11 LAB — MAGNESIUM: Magnesium: 2.1 mg/dL (ref 1.7–2.4)

## 2020-02-11 LAB — COOXEMETRY PANEL
Carboxyhemoglobin: 1.4 % (ref 0.5–1.5)
Methemoglobin: 1 % (ref 0.0–1.5)
O2 Saturation: 50.7 %
Total hemoglobin: 12 g/dL (ref 12.0–16.0)

## 2020-02-11 NOTE — Progress Notes (Signed)
Progress Note  Patient Name: Crystal [name] Date of Encounter: 02/11/2020  Primary Cardiologist: [name], MD   Subjective    BPs were in 2s yesterday. Milrinone turned down to 01.25. SBP now slightly improved 80-100.  However she is very weak and SOB. Co-ox down to 51% CVP 21-22   Inpatient Medications    Scheduled Meds: . amiodarone  200 mg Oral Daily  . Chlorhexidine Gluconate Cloth  6 each Topical Daily  . heparin  5,000 Units Subcutaneous Q8H  . levothyroxine  50 mcg Oral Daily  . midodrine  10 mg Oral TID WC  . senna-docusate  1 tablet Oral QHS  . sodium chloride flush  10-40 mL Intracatheter Q12H  . sodium chloride flush  3 mL Intravenous Once  . sodium chloride flush  3 mL Intravenous Q12H  . sorbitol  30 mL Oral Daily   Continuous Infusions: . sodium chloride 10 mL/hr at 02/11/20 0011  . furosemide (LASIX) infusion 12 mg/hr (02/11/20 0710)  . milrinone 0.125 mcg/kg/min (02/10/20 1042)   PRN Meds: sodium chloride, acetaminophen, albuterol, ALPRAZolam, guaiFENesin-dextromethorphan, ondansetron (ZOFRAN) IV, sodium chloride flush, sodium chloride flush   Vital Signs    Vitals:   02/11/20 0700 02/11/20 0800 02/11/20 1000 02/11/20 1042  BP: 101/60 102/62 (!) 89/71 (!) 108/51  Pulse: 77 75 72 78  Resp:  20    Temp:  98 F (36.7 C)    TempSrc:  Oral    SpO2: 93% 99% 98% 98%  Weight:      Height:        Intake/Output Summary (Last 24 hours) at 02/11/2020 1315 Last data filed at 02/11/2020 1042 Gross per 24 hour  Intake 868.16 ml  Output 1001 ml  Net -132.84 ml   Last 3 Weights 02/11/2020 02/10/2020 02/09/2020  Weight (lbs) 137 lb 5.6 oz 135 lb 5.8 oz 133 lb 2.5 oz  Weight (kg) 62.3 kg 61.4 kg 60.4 kg      Telemetry    SR 70-80s + frequent PVCs - Personally Reviewed  Physical Exam   General:  Frail, weak elderly woman sitting up in bed. Mild dyspnea HEENT: normal Neck: supple.JVP to ear . Carotids 2+ bilat; no bruits. No lymphadenopathy or  thryomegaly appreciated. Cor: PMI nondisplaced. Regular rate & rhythm. 2/6 TR + s3 Lungs: clear Abdomen: soft, nontender, nondistended. No hepatosplenomegaly. No bruits or masses. Good bowel sounds. Extremities: no cyanosis, clubbing, rash, 2-3+ edema Neuro: alert & orientedx3, cranial nerves grossly intact. moves all 4 extremities w/o difficulty. Affect pleasant   Labs    High Sensitivity Troponin:   Recent Labs  Lab 02/05/20 1221 02/05/20 1430  TROPONINIHS 14 13      Chemistry Recent Labs  Lab 02/09/20 0432 02/10/20 0254 02/11/20 0315  NA 134* 133* 129*  K 3.0* 3.4* 5.0  CL 95* 97* 94*  CO2 29 26 27   GLUCOSE 143* 173* 113*  BUN 64* 63* 69*  CREATININE 1.67* 1.71* 2.00*  CALCIUM 9.1 8.3* 8.9  GFRNONAA 28* 27* 22*  GFRAA 32* 31* 26*  ANIONGAP 10 10 8      Hematology Recent Labs  Lab 02/05/20 1221 02/10/20 0254  WBC 3.0* 3.1*  RBC 4.12 3.54*  HGB 12.4 10.6*  HCT 38.3 31.9*  MCV 93.0 90.1  MCH 30.1 29.9  MCHC 32.4 33.2  RDW 16.5* 15.9*  PLT 111* 126*    BNP Recent Labs  Lab 02/05/20 1332  BNP 3,829.9*     DDimer No results for input(s):  DDIMER in the last 168 hours.   Radiology    No results found.  Cardiac Studies   N/a   Patient Profile     86 y.o. female  with PMH of non-ischemic cardiomyopathy, HTN, hypothyroidism, sarcoidosis, and anxiety, who presented for management of her CHF.  Assessment & Plan    1. Acute on Chronic Biventricular Heart Failure w/ Low Output/ Stage D Heart Failure -> cardiogenic shock - NICM dating back to 2010. Echo in 2010 showed EF 15-20%. LHC showed normal cors - Echo 11/24/19 w/ EF25-30% w/ global hypokinesis w/ inferior/inferoseptal akinesis, G2DD but no LVH. RV was mildly enlarged and systolic function moderately reduced. - has known pulmonary sarcoid but no known cardiac involvement  - This is 2nd admission for a/c CHF in last 3 months requiring milrinone - initial co-ox this admit 36%. Improved w/  milrinone 0.25 but had to cut back to 0.125 with hypotension. Now co-ox back down in shock range and volume status very high. Renal function worsening  - she is actively dying from end-stage HF refractory to inotropes. I think she will need inpatient hospice/hospice home - I discussed this again with her and her husband and they continue to be in denial about exactly how severe her HF is and her husband said " don't you think we need more time to see how it goes?". I told him that I felt her body was declaring itself as she has had multiple recent admits for HF and now failing inotrope support. I strongly suggested the need for Hospice. He said he wants his son to be here tomorrow to discuss.  - Palliative care actively following. I have d/w them again today  2. AKI: - Due to cardiorenal syndrome/shock. Baseline creatinine 1.3-1.4  - Creatinine 1.8>1.6>1.71> 2.0  3. Pulmonary Sarcoid - followed by pulmonology  4. PVC - Frequent PVCs noted > 20 per hour. Doubt this is cause of CM as CM is longstanding. Stop amio  5. Hypokalemia  - K high. Stop K-dur - can give lokelma as needed. - not candidate for HD  6. Hyponatremia - due to advanced HF - restrict free water as tolerated  Total time spent 40 minutes. Over half that time spent discussing above.    For questions or updates, please contact [place] Please consult [email or web address] for contact info under        Signed, Glori Bickers, MD  02/11/2020, 1:15 PM

## 2020-02-11 NOTE — Progress Notes (Signed)
Daily Progress Note   Patient Name: Crystal [name]       Date: 02/11/2020 DOB: 1934/06/19  Age: 86 y.o. MRN#: [id number] Attending Physician: Lelon Perla, MD Primary Care Physician: Mosie Lukes, MD Admit Date: 02/05/2020  Reason for Consultation/Follow-up: Establishing goals of care and Psychosocial/spiritual support  Subjective: Met with patient and husband at bedside.  Patient appears fatigued and unhappy.  She asks me to tell her about hospice facility.  I responded to her question with a description of hospice facility and excellent care provided there.  She explains that the doctor is recommending Hospice facility.    Patient and husband Jeneen Rinks tell me firmly that they want to "take her home".  I talked with them about needing 4-5 people to provide round the clock care.  I also described the difficulty with having to make decisions about giving medications - it takes knowledge and courage and is very difficult for grieving family members to do.  I confided in the couple that even though I'm a Palliative Medicine provider I had difficulty giving care and medications to my own mother at the end of her life.  Jeneen Rinks explains that both of his parents died at home.  He wants his wife to be at home.  He explains that they have 5 children to help and they have grandchildren that are medically trained.  He wants the family to be able to be with her.  He seems firm in his decision to take her home.  I spoke with Darnell Level (son who retired from [employer]) over the phone.  I explained our concerns that she will decline very rapidly once the milrinone is removed.  We are concerned that she will have severe symptoms.   Bruce listened to me but stated 2x that he has to respect his father's wishes.    I  went back to the patient's room to discuss DNR with the couple.  Mrs. Bresnan was alone sitting on the side of the bed eating dinner (she looked better than earlier).  I will not discuss DNR with her alone - I need for her family to be with her.   Assessment: Patient with end stage heart failure.  Stabilizes on milrinone but declines rapidly when it is decreased.   Patient Profile/HPI:  86 y.o. female  with past medical history of pulmonary sarcoidosis, hypothyroidism and CHF who was admitted on 02/05/2020 with acute on chronic mixed HF with low output.  The Advanced Heart Failure team has been diuresing her.  Her blood pressure has remained soft.  She is currently on milrinone.   Per Cardiology she is a poor LVAD candidate.      Length of Stay: 6  Current Medications: Scheduled Meds:  . amiodarone  200 mg Oral Daily  . Chlorhexidine Gluconate Cloth  6 each Topical Daily  . heparin  5,000 Units Subcutaneous Q8H  . levothyroxine  50 mcg Oral Daily  . midodrine  10 mg Oral TID WC  . senna-docusate  1 tablet Oral QHS  . sodium chloride flush  10-40 mL Intracatheter Q12H  . sodium chloride flush  3 mL Intravenous Once  . sodium chloride flush  3 mL Intravenous Q12H  . sorbitol  30 mL Oral Daily    Continuous Infusions: . sodium chloride 10 mL/hr at 02/11/20 0011  . furosemide (LASIX) infusion 12 mg/hr (02/11/20 1435)  . milrinone 0.125 mcg/kg/min (02/10/20 1042)    PRN Meds: sodium chloride, acetaminophen, albuterol, ALPRAZolam, guaiFENesin-dextromethorphan, ondansetron (ZOFRAN) IV, sodium chloride flush, sodium chloride flush  Physical Exam        Frail pleasant elderly female, awake, alert and orientated. NAD.   Husband at bedside.  Vital Signs: BP (!) 108/51   Pulse 78   Temp 98 F (36.7 C) (Oral)   Resp 20   Ht '5\' 3"'  (1.6 m)   Wt 62.3 kg   SpO2 98%   BMI 24.33 kg/m  SpO2: SpO2: 98 % O2 Device: O2 Device: Room Air O2 Flow Rate: O2 Flow Rate (L/min): 2  L/min  Intake/output summary:   Intake/Output Summary (Last 24 hours) at 02/11/2020 1922 Last data filed at 02/11/2020 1904 Gross per 24 hour  Intake 1453.89 ml  Output 1850 ml  Net -396.11 ml   LBM: Last BM Date: 02/11/20 Baseline Weight: Weight: 64.9 kg Most recent weight: Weight: 62.3 kg       Palliative Assessment/Data: 40%      Patient Active Problem List   Diagnosis Date Noted  . Palliative care encounter   . Acute on chronic combined systolic and diastolic CHF (congestive heart failure) ([place]) 02/05/2020  . Edema 01/31/2020  . Constipation 12/17/2019  . NSVT (nonsustained ventricular tachycardia) ([place])   . CHF (congestive heart failure) ([place]) 11/21/2019  . Hypoxia 05/29/2019  . Mediastinal adenopathy 05/24/2019  . Acute on chronic respiratory failure with hypoxemia ([place]) 01/12/2019  . CKD stage G3a/A1, GFR 45-59 and albumin creatinine ratio <30 mg/g 11/30/2018  . Acute on chronic combined systolic (congestive) and diastolic (congestive) heart failure ([place]) 11/29/2018  . Cavitating mass in left upper lung lobe 06/29/2018  . Hilar adenopathy 06/29/2018  . Sarcoidosis 06/22/2018  . IBS (irritable bowel syndrome) 02/21/2018  . Anemia 08/21/2017  . Hypercalcemia 04/20/2017  . Diffuse large B-cell lymphoma of lymph nodes of neck ([place]) 01/21/2017  . Mitral valve regurgitation, Moderate to severe 11/26/2015  . Chronic systolic heart failure ([place]) 11/26/2015  . Hyperlipidemia, mixed 10/17/2015  . Vitamin D deficiency 10/17/2015  . History of angioedema 06/11/2015  . Hematuria 06/11/2015  . Preventative health care 12/14/2014  . Hyperglycemia 03/24/2012  . Diverticulosis 10/01/2011  . Thrombocytopenia ([place]) 10/02/2010  . Hypothyroidism 01/24/2010  . RHEUMATOID FACTOR, POSITIVE 01/24/2010  . Non Hodgkin's lymphoma ([place]) 01/09/2010  . RAYNAUDS SYNDROME 01/09/2010  . Anxiety state 09/20/2009  .  Essential hypertension 09/20/2009  . Nonischemic cardiomyopathy ([place])  09/20/2009  . Congestive heart failure ([place]) 09/20/2009    Palliative Care Plan    Recommendations/Plan:  Will visit the family again tomorrow.  Son arriving between 2-3.    Need to discuss DNR, and MOST form.  Will encourage Hospice Facility, but if the family steadfastly refuses I will do my best to prepare them for the dying experience at home.  Providing the children education about what to expect and symptom management.  l   Will also work with Hospice to ensure everything is in place prior to discharge and that the Hospice RN will be present the day of discharge to begin care.  Goals of Care and Additional Recommendations:  Limitations on Scope of Treatment: Full Scope Treatment  Code Status:  Full code  Prognosis:   < 2 weeks.  Rapid decline after Milrinone is discontinued.   Discharge Planning:  To Be [place] vs Home with Hospice  Care plan was discussed with Dr. Haroldine Laws, patient and family.  Thank you for allowing the Palliative Medicine Team to assist in the care of this patient.  Total time spent:  35 min     Greater than 50%  of this time was spent counseling and coordinating care related to the above assessment and plan.  Florentina Jenny, PA-C Palliative Medicine  Please contact Palliative MedicineTeam phone at [phone] for questions and concerns between 7 am - 7 pm.   Please see AMION for individual provider pager numbers.

## 2020-02-12 DIAGNOSIS — Z515 Encounter for palliative care: Secondary | ICD-10-CM

## 2020-02-12 LAB — BASIC METABOLIC PANEL
Anion gap: 11 (ref 5–15)
BUN: 68 mg/dL — ABNORMAL HIGH (ref 8–23)
CO2: 29 mmol/L (ref 22–32)
Calcium: 9.1 mg/dL (ref 8.9–10.3)
Chloride: 88 mmol/L — ABNORMAL LOW (ref 98–111)
Creatinine, Ser: 1.86 mg/dL — ABNORMAL HIGH (ref 0.44–1.00)
GFR calc Af Amer: 28 mL/min — ABNORMAL LOW (ref >60)
GFR calc non Af Amer: 24 mL/min — ABNORMAL LOW (ref >60)
Glucose, Bld: 202 mg/dL — ABNORMAL HIGH (ref 70–99)
Potassium: 3.4 mmol/L — ABNORMAL LOW (ref 3.5–5.1)
Sodium: 128 mmol/L — ABNORMAL LOW (ref 135–145)

## 2020-02-12 LAB — COOXEMETRY PANEL
Carboxyhemoglobin: 1.6 % — ABNORMAL HIGH (ref 0.5–1.5)
Methemoglobin: 1.1 % (ref 0.0–1.5)
O2 Saturation: 58.5 %
Total hemoglobin: 11.3 g/dL — ABNORMAL LOW (ref 12.0–16.0)

## 2020-02-12 MED ORDER — METOLAZONE 5 MG PO TABS
5.0000 mg | ORAL_TABLET | Freq: Every day | ORAL | Status: DC
  Administered 2020-02-12 – 2020-02-14 (×3): 5 mg via ORAL
  Filled 2020-02-12 (×3): qty 1

## 2020-02-12 MED ORDER — POTASSIUM CHLORIDE CRYS ER 20 MEQ PO TBCR
30.0000 meq | EXTENDED_RELEASE_TABLET | Freq: Two times a day (BID) | ORAL | Status: AC
  Administered 2020-02-12 (×2): 30 meq via ORAL
  Filled 2020-02-12 (×2): qty 1

## 2020-02-12 NOTE — Progress Notes (Signed)
[facility])  Referral received from PMT for hospice services at home once discharged.    Discussed with family, explained hospice and answered questions.    Pt will d/c home Tuesday afternoon or Wednesday am per family.  [facility] will contact hospital to determine d/c date so we can have a RN meet the family when they get home.  Pt currently has O2 through [facility], will need to change to different vendor, but should the family want to go prior to this change out as long as there is O2 there, we can swap it out later.  Family has also requested wheelchair.  [facility] will order necessary DME.  Please send completed DNR home with pt.  Please arrange for any comfort rx prior to discharge so family may pick up before pt arrives at home, this will prevent any lapse in symptoms.  Thank you, Venia Carbon RN, BSN, [facility] Liaison

## 2020-02-12 NOTE — Consult Note (Signed)
[name] CM Inpatient Consult   02/12/2020  Crystal [name] February 18, 1934 [id number]   Patient screened for high risk score and for length of stay [7 days] hospitalization to check for patient in the [employer] ACO,  if potential [facility] Care Management services are needed.  Review of patient's medical record reveals patient is being considered with family to  transitioned to comfort care. Assess/Plan:  If hospice is the disposition, patient will have post hospital care management needs met by Hospice, which provides complete care management in that benefit.    Will sign off after disposition is confirmed.  For questions contact:   Natividad Brood, RN BSN [facility] Liaison  [phone] business mobile phone Toll free office [phone]  Fax number: [phone] [name]

## 2020-02-12 NOTE — Care Management Important Message (Signed)
Important Message  Patient Details  Name: Crystal [name] MRN: [id number] Date of Birth: 1934-11-10   Medicare Important Message Given:  Yes     Shelda Altes 02/12/2020, 9:59 AM

## 2020-02-12 NOTE — Progress Notes (Signed)
Daily Progress Note   Patient Name: Crystal [name]       Date: 02/12/2020 DOB: 01/03/1934  Age: 86 y.o. MRN#: [id number] Attending Physician: Crystal Perla, MD Primary Care Physician: Crystal Lukes, MD Admit Date: 02/05/2020  Reason for Consultation/Follow-up: Establishing goals of care and Psychosocial/spiritual support  Subjective: Extended meeting held with patient, Crystal [name], Crystal [name] (son) and Olin Hauser (daughter).  Discussed at length our concerns that patient will decline rapidly on discharge and the family will be primarily responsible if they decide to go home rather than to [place].  Patient expressed that she did not sleep last night because she wants to go home and does not want to go anywhere else.  We reviewed the MOST form in detail.  Family choose DNR, Comfort Measures only - do not transfer back to the hospital unless comfort needs can not be met at home, and no feeding tube.  MOST form allows for the patient to die in the comfort of her home rather than dying in the ER/ICU or [place].   Presented and discussed the DNR form.  Family expressed concerns that they would like their long term trusted physicians - Dr. Stanford [name] and Dr. Randel [name] to be aware of the recommendations.  The family wanted to know if Drs. Crystal [name] and Crystal [name] supported the recommendations.  I expressed concerns that the family would need to be prepared to become the RN, Tech and MD at home.  In other words, they would have some guidance from Hospice but they would primarily be responsible for Crystal [name]'s care and medication administration.  Family expressed that they are comfortable caring for Crystal [name] thru end of life.  They also expressed strong faith in the [place] that he would guide them thru the  experience.  MOST and DNR were completed and placed on the chart.  Copies of the MOST were given to the family.  We prayed together for Crystal [name] to live in [place] and peace for the rest of her days.   Assessment: Patient with End stage heart disease.  Appears to be feeling better on Milrinone.  Family making plans to take her home with hospice services.   Patient Profile/HPI:  86 y.o. female  with past medical history of pulmonary sarcoidosis, hypothyroidism and CHF who was admitted on 02/05/2020  with acute on chronic mixed HF with low output.  The Advanced Heart Failure team has been diuresing her.  Her blood pressure has remained soft.  She is currently on milrinone.   Per Cardiology she is a poor LVAD candidate.     Length of Stay: 7  Current Medications: Scheduled Meds:  . amiodarone  200 mg Oral Daily  . Chlorhexidine Gluconate Cloth  6 each Topical Daily  . heparin  5,000 Units Subcutaneous Q8H  . levothyroxine  50 mcg Oral Daily  . midodrine  10 mg Oral TID WC  . potassium chloride  30 mEq Oral BID  . senna-docusate  1 tablet Oral QHS  . sodium chloride flush  10-40 mL Intracatheter Q12H  . sodium chloride flush  3 mL Intravenous Once  . sodium chloride flush  3 mL Intravenous Q12H  . sorbitol  30 mL Oral Daily    Continuous Infusions: . sodium chloride 10 mL/hr at 02/11/20 0011  . furosemide (LASIX) infusion 12 mg/hr (02/12/20 1324)  . milrinone 0.125 mcg/kg/min (02/12/20 0639)    PRN Meds: sodium chloride, acetaminophen, albuterol, ALPRAZolam, guaiFENesin-dextromethorphan, ondansetron (ZOFRAN) IV, sodium chloride flush, sodium chloride flush  Physical Exam        Frail pleasant appearing female, awake, alert, orientated x 4  Vital Signs: BP 111/67 (BP Location: Left Arm)   Pulse 80   Temp (!) 97.5 F (36.4 C) (Oral)   Resp 17   Ht '5\' 3"'  (1.6 m)   Wt 61.4 kg   SpO2 100%   BMI 23.98 kg/m  SpO2: SpO2: 100 % O2 Device: O2 Device: Room Air O2 Flow Rate: O2 Flow  Rate (L/min): 2 L/min  Intake/output summary:   Intake/Output Summary (Last 24 hours) at 02/12/2020 1723 Last data filed at 02/12/2020 1306 Gross per 24 hour  Intake 580 ml  Output 2300 ml  Net -1720 ml   LBM: Last BM Date: 02/11/20 Baseline Weight: Weight: 64.9 kg Most recent weight: Weight: 61.4 kg       Palliative Assessment/Data: 40%      Patient Active Problem List   Diagnosis Date Noted  . Palliative care encounter   . Acute on chronic combined systolic and diastolic CHF (congestive heart failure) ([place]) 02/05/2020  . Edema 01/31/2020  . Constipation 12/17/2019  . NSVT (nonsustained ventricular tachycardia) ([place])   . CHF (congestive heart failure) ([place]) 11/21/2019  . Hypoxia 05/29/2019  . Mediastinal adenopathy 05/24/2019  . Acute on chronic respiratory failure with hypoxemia ([place]) 01/12/2019  . CKD stage G3a/A1, GFR 45-59 and albumin creatinine ratio <30 mg/g 11/30/2018  . Acute on chronic combined systolic (congestive) and diastolic (congestive) heart failure ([place]) 11/29/2018  . Cavitating mass in left upper lung lobe 06/29/2018  . Hilar adenopathy 06/29/2018  . Sarcoidosis 06/22/2018  . IBS (irritable bowel syndrome) 02/21/2018  . Anemia 08/21/2017  . Hypercalcemia 04/20/2017  . Diffuse large B-cell lymphoma of lymph nodes of neck ([place]) 01/21/2017  . Mitral valve regurgitation, Moderate to severe 11/26/2015  . Chronic systolic heart failure ([place]) 11/26/2015  . Hyperlipidemia, mixed 10/17/2015  . Vitamin D deficiency 10/17/2015  . History of angioedema 06/11/2015  . Hematuria 06/11/2015  . Preventative health care 12/14/2014  . Hyperglycemia 03/24/2012  . Diverticulosis 10/01/2011  . Thrombocytopenia ([place]) 10/02/2010  . Hypothyroidism 01/24/2010  . RHEUMATOID FACTOR, POSITIVE 01/24/2010  . Non Hodgkin's lymphoma ([place]) 01/09/2010  . RAYNAUDS SYNDROME 01/09/2010  . Anxiety state 09/20/2009  . Essential hypertension 09/20/2009  . Nonischemic cardiomyopathy  (  [place]) 09/20/2009  . Congestive heart failure ([place]) 09/20/2009    Palliative Care Plan    Recommendations/Plan:  Family requests PT to work with her to get her up (if there is time before DC).  Ideally if she can get up safely family would prefer to drive her home (she will likely need an ambulance).  Per Dr. Haroldine Laws, Milrinone will remain infusing until time of discharge.  [facility] requested by family.  As it was after 5:00 I contacted them to be in touch with son Darnell Level.  Family  was concerned  that they had been contacted by "[facility]" before they had agreed to Hospice.  They felt this was inappropriate.  MEDICATION recommendations for discharge in addition to diuretics and midodrine - Please have Rx at pharmacy for pick up so that they can be in the home when she arrives.  1.  Roxanol concentrate 5-15 mg q 2 hours PRN shortness of breath or discomfort.  If symptoms persist 30 minutes after 1 dose, may give a second dose before the two hour interval.  Have discussed with family the need to titrate up or down based on symptoms.  This may bear repeating to the family prior to discharge.  2.  Ativan solution 0.5 mg q 4 hours prn anxiety or for sleep.  3.  Robinul 0.4 mg TID PRN excess secretions   Recommend writing the word "Hospice" on the controlled prescriptions.  Doing so exempts them from the STOP act.  Goals of Care and Additional Recommendations: Limitations on Scope of Treatment:  Focus on comfort.  No plans to return to the hospital.    Code Status:  DNR   Prognosis:  Uncertain.  Likely days to weeks.  Discharge Planning:  Home with Hospice  Care plan was discussed with Family, attending team, HF team.  Thank you for allowing the Palliative Medicine Team to assist in the care of this patient.  Total time spent:  120 min. 3:00 pm - 5:00 pm     Greater than 50%  of this time was spent counseling and coordinating care  related to the above assessment and plan.  Florentina Jenny, PA-C Palliative Medicine  Please contact Palliative MedicineTeam phone at [phone] for questions and concerns between 7 am - 7 pm.   Please see AMION for individual provider pager numbers.

## 2020-02-12 NOTE — Progress Notes (Addendum)
Progress Note  Patient Name: Crystal [name] Date of Encounter: 02/12/2020  Primary Cardiologist: [name], MD   Subjective  Remains on milrinone 0.125 mcg. CO-OX 61%.   Diuresing with lasix drip. CVP 20. I/O not accurate.   Denies SOB.   Inpatient Medications    Scheduled Meds: . amiodarone  200 mg Oral Daily  . Chlorhexidine Gluconate Cloth  6 each Topical Daily  . heparin  5,000 Units Subcutaneous Q8H  . levothyroxine  50 mcg Oral Daily  . midodrine  10 mg Oral TID WC  . senna-docusate  1 tablet Oral QHS  . sodium chloride flush  10-40 mL Intracatheter Q12H  . sodium chloride flush  3 mL Intravenous Once  . sodium chloride flush  3 mL Intravenous Q12H  . sorbitol  30 mL Oral Daily   Continuous Infusions: . sodium chloride 10 mL/hr at 02/11/20 0011  . furosemide (LASIX) infusion 12 mg/hr (02/11/20 1435)  . milrinone 0.125 mcg/kg/min (02/12/20 0639)   PRN Meds: sodium chloride, acetaminophen, albuterol, ALPRAZolam, guaiFENesin-dextromethorphan, ondansetron (ZOFRAN) IV, sodium chloride flush, sodium chloride flush   Vital Signs    Vitals:   02/12/20 0600 02/12/20 0630 02/12/20 0700 02/12/20 0730  BP: 109/62 (!) 104/58 (!) 108/59   Pulse: 76  74   Resp:      Temp:    (!) 97.4 F (36.3 C)  TempSrc:      SpO2: 96%  91%   Weight:      Height:        Intake/Output Summary (Last 24 hours) at 02/12/2020 0841 Last data filed at 02/12/2020 0504 Gross per 24 hour  Intake 835.73 ml  Output 2650 ml  Net -1814.27 ml   Last 3 Weights 02/12/2020 02/11/2020 02/10/2020  Weight (lbs) 135 lb 5.8 oz 137 lb 5.6 oz 135 lb 5.8 oz  Weight (kg) 61.4 kg 62.3 kg 61.4 kg      Telemetry  SR 70-80s personally reviewed.   Physical Exam  CVP 20 personally checked.  General:   No resp difficulty HEENT: normal Neck: supple. JPV to jaw . Carotids 2+ bilat; no bruits. No lymphadenopathy or thryomegaly appreciated. Cor: PMI nondisplaced. Regular rate & rhythm. No rubs, or  murmurs. +S3 Lungs: clear Abdomen: soft, nontender, nondistended. No hepatosplenomegaly. No bruits or masses. Good bowel sounds. Extremities: no cyanosis, clubbing, rash, R and LLE 1+ edema Neuro: alert & orientedx3, cranial nerves grossly intact. moves all 4 extremities w/o difficulty. Affect pleasant  Labs    High Sensitivity Troponin:   Recent Labs  Lab 02/05/20 1221 02/05/20 1430  TROPONINIHS 14 13      Chemistry Recent Labs  Lab 02/09/20 0432 02/10/20 0254 02/11/20 0315  NA 134* 133* 129*  K 3.0* 3.4* 5.0  CL 95* 97* 94*  CO2 29 26 27   GLUCOSE 143* 173* 113*  BUN 64* 63* 69*  CREATININE 1.67* 1.71* 2.00*  CALCIUM 9.1 8.3* 8.9  GFRNONAA 28* 27* 22*  GFRAA 32* 31* 26*  ANIONGAP 10 10 8      Hematology Recent Labs  Lab 02/05/20 1221 02/10/20 0254  WBC 3.0* 3.1*  RBC 4.12 3.54*  HGB 12.4 10.6*  HCT 38.3 31.9*  MCV 93.0 90.1  MCH 30.1 29.9  MCHC 32.4 33.2  RDW 16.5* 15.9*  PLT 111* 126*    BNP Recent Labs  Lab 02/05/20 1332  BNP 3,829.9*     DDimer No results for input(s): DDIMER in the last 168 hours.   Radiology  No results found.  Cardiac Studies   N/a   Patient Profile     86 y.o. female  with PMH of non-ischemic cardiomyopathy, HTN, hypothyroidism, sarcoidosis, and anxiety, who presented for management of her CHF.  Assessment & Plan    1. Acute on Chronic Biventricular Heart Failure w/ Low Output/ Stage D Heart Failure -> cardiogenic shock - NICM dating back to 2010. Echo in 2010 showed EF 15-20%. LHC showed normal cors - Echo 11/24/19 w/ EF25-30% w/ global hypokinesis w/ inferior/inferoseptal akinesis, G2DD but no LVH. RV was mildly enlarged and systolic function moderately reduced. - has known pulmonary sarcoid but no known cardiac involvement  - This is 2nd admission for a/c CHF in last 3 months requiring milrinone - initial co-ox this admit 36%. Improved w/ milrinone 0.25 but had to cut back to 0.125 with hypotension.  Todays  CO-OX is 58.5%. on milrinone 0.125 mcg.  -CVP 20 despite lasix drip at 12 mg per hour.  - BMET pending.  - she is actively dying from end-stage HF refractory to inotropes. I think she will need inpatient hospice/hospice home - Per Dr Haroldine Laws --->I discussed this again with her and her husband and they continue to be in denial about exactly how severe her HF is and her husband said " don't you think we need more time to see how it goes?". I told him that I felt her body was declaring itself as she has had multiple recent admits for HF and now failing inotrope support. I strongly suggested the need for Hospice. He said he wants his son to be here tomorrow to discuss.  - Palliative care actively following.   2. AKI: - Due to cardiorenal syndrome/shock. Baseline creatinine 1.3-1.4  - BMET pending.   3. Pulmonary Sarcoid - followed by pulmonology  4. PVC - Frequent PVCs noted > 20 per hour. Doubt this is cause of CM as CM is longstanding.  Amio stopped.   5. Hypokalemia  BMET pending.  - can give lokelma as needed. - not candidate for HD  6. Hyponatremia - due to advanced HF - restrict free water as tolerated  Palliative Care appreciated.   Advanced Heart Failure Team Pager [phone] (M-F; [place])  Please contact [facility] for night-coverage after hours (4p -7a ) and weekends on [email or web address]      Signed, Darrick Grinder, NP  02/12/2020, 8:41 AM     Patient seen and examined with the above-signed Advanced Practice Provider and/or Housestaff. I personally reviewed laboratory data, imaging studies and relevant notes. I independently examined the patient and formulated the important aspects of the plan. I have edited the note to reflect any of my changes or salient points. I have personally discussed the plan with the patient and/or family.  Long talk with Hospice team, patient and family. She remains very tenuous despite milrinone support. Not responding well to IV lasix. CVP  remains 20. Still SOB at rest.   General:  Elderly  Sitting in bed mildly SOB HEENT: normal Neck: supple. JVP to ear . Carotids 2+ bilat; no bruits. No lymphadenopathy or thryomegaly appreciated. Cor: PMI nondisplaced. Regular tachy + s3 Lungs: clear Abdomen: soft, nontender, nondistended. No hepatosplenomegaly. No bruits or masses. Good bowel sounds. Extremities: no cyanosis, clubbing, rash, 2-3+ edema cool  Neuro: alert & orientedx3, cranial nerves grossly intact. moves all 4 extremities w/o difficulty. Affect pleasant  Have decided on home hospice.  Will continue milrinone until the time of d/c. Try to push  diuresis a bit prior to d/c for comfort sake.   Glori Bickers, MD  6:37 PM

## 2020-02-12 NOTE — Progress Notes (Addendum)
Conference call this am with Bruce and Porfirio Mylar (Patient's son and daughter).    We discussed the patient being near end of life and is expected to have a very rapid decline after the discontinuation of IV medications here in the hospital.    We discussed the unexpected severity of symptoms that may arise at end of life and the expertise available at [facility] to deal with those.  We discussed the magnified difficulty of grieving while having to give EOL care and medications.  We discussed the concern that if the family opted to bring her back to the hospital she would likely die in the ER.   With concern for the patient and family the medical team recommends a discharge to [facility].  Family explained that Olin Hauser was a Psychologist, counselling at [employer] for 6 years and it was not unusual for her to sit with dying patients.  Bruce was a phlebotomist on all floors in the hospital including the ER.  Patient's husband is a Theme park manager who is accustomed to being with his parishioners as they are dying. The family took both of their grandparents home to pass in the home.     Most of all the family is concerned that their mother does not want to be in a facility when she passes.   We discussed DNR and MOST forms (Comfort Measures Only) to be completed today.    Palliative Care Meeting scheduled in [place] conference room at 3:00 pm.  Florentina Jenny, PA-C Palliative Medicine Office:  [phone]  30 min.

## 2020-02-13 ENCOUNTER — Telehealth: Payer: Self-pay

## 2020-02-13 LAB — BASIC METABOLIC PANEL
Anion gap: 9 (ref 5–15)
BUN: 66 mg/dL — ABNORMAL HIGH (ref 8–23)
CO2: 31 mmol/L (ref 22–32)
Calcium: 8.9 mg/dL (ref 8.9–10.3)
Chloride: 90 mmol/L — ABNORMAL LOW (ref 98–111)
Creatinine, Ser: 1.7 mg/dL — ABNORMAL HIGH (ref 0.44–1.00)
GFR calc Af Amer: 32 mL/min — ABNORMAL LOW (ref >60)
GFR calc non Af Amer: 27 mL/min — ABNORMAL LOW (ref >60)
Glucose, Bld: 132 mg/dL — ABNORMAL HIGH (ref 70–99)
Potassium: 4 mmol/L (ref 3.5–5.1)
Sodium: 130 mmol/L — ABNORMAL LOW (ref 135–145)

## 2020-02-13 LAB — COOXEMETRY PANEL
Carboxyhemoglobin: 1.8 % — ABNORMAL HIGH (ref 0.5–1.5)
Methemoglobin: 1.1 % (ref 0.0–1.5)
O2 Saturation: 66.7 %
Total hemoglobin: 10.9 g/dL — ABNORMAL LOW (ref 12.0–16.0)

## 2020-02-13 LAB — MAGNESIUM: Magnesium: 1.9 mg/dL (ref 1.7–2.4)

## 2020-02-13 MED ORDER — POLYETHYLENE GLYCOL 3350 17 G PO PACK
17.0000 g | PACK | Freq: Once | ORAL | Status: AC
  Administered 2020-02-13: 11:00:00 17 g via ORAL
  Filled 2020-02-13: qty 1

## 2020-02-13 MED ORDER — POLYETHYLENE GLYCOL 3350 17 G PO PACK: 17.0000 g | PACK | Freq: Every day | ORAL | Status: DC

## 2020-02-13 MED ORDER — POTASSIUM CHLORIDE CRYS ER 20 MEQ PO TBCR
20.0000 meq | EXTENDED_RELEASE_TABLET | Freq: Once | ORAL | Status: AC
  Administered 2020-02-13: 20 meq via ORAL
  Filled 2020-02-13: qty 1

## 2020-02-13 MED ORDER — MAGNESIUM SULFATE IN D5W 1-5 GM/100ML-% IV SOLN
1.0000 g | Freq: Once | INTRAVENOUS | Status: AC
  Administered 2020-02-13: 13:00:00 1 g via INTRAVENOUS
  Filled 2020-02-13: qty 100

## 2020-02-13 MED ORDER — SENNOSIDES-DOCUSATE SODIUM 8.6-50 MG PO TABS
1.0000 | ORAL_TABLET | Freq: Two times a day (BID) | ORAL | Status: DC
  Administered 2020-02-13 – 2020-02-14 (×2): 1 via ORAL
  Filled 2020-02-13 (×2): qty 1

## 2020-02-13 NOTE — Evaluation (Signed)
Physical Therapy Evaluation Patient Details Name: Crystal [name] MRN: [id number] DOB: [birth year] Today's Date: 02/13/2020   History of Present Illness  Pt adm with acute on chronic biventricular heart failure. Pt is end-stage heart failure and will return home with hospice. PMH - hear failure, pulmonary sarcoid, htn   Clinical Impression  Pt presents to PT able to ambulate short distances with assist. Pt should be able to go home by car with her family and get into her home. Will follow patient while she is here to maximize her mobility. Pt to have hospice at home.     Follow Up Recommendations No PT follow up;Supervision/Assistance - 24 hour(home with hospice)    Equipment Recommendations  None recommended by PT    Recommendations for Other Services       Precautions / Restrictions Precautions Precautions: Fall      Mobility  Bed Mobility Overal bed mobility: Needs Assistance Bed Mobility: Sit to Supine       Sit to supine: Min assist   General bed mobility comments: Assist to bring legs back up into bed  Transfers Overall transfer level: Needs assistance Equipment used: 1 person hand held assist Transfers: Sit to/from Stand Sit to Stand: Min guard         General transfer comment: Assist for safety and lines  Ambulation/Gait Ambulation/Gait assistance: Min assist;Min guard Gait Distance (Feet): 100 Feet Assistive device: 1 person hand held assist;4-wheeled walker Gait Pattern/deviations: Step-through pattern;Decreased stride length;Narrow base of support Gait velocity: decr Gait velocity interpretation: <1.31 ft/sec, indicative of household ambulator General Gait Details: Min assist with hand held for balance. With use of rollator min guard for safety and lines.   Stairs            Wheelchair Mobility    Modified Rankin (Stroke Patients Only)       Balance Overall balance assessment: Needs assistance Sitting-balance support: No upper extremity  supported;Feet supported Sitting balance-Leahy Scale: Good     Standing balance support: Single extremity supported Standing balance-Leahy Scale: Poor Standing balance comment: UE support and min guard for static standing                             Pertinent Vitals/Pain Pain Assessment: No/denies pain    Home Living Family/patient expects to be discharged to:: Private residence Living Arrangements: Spouse/significant other Available Help at Discharge: Family;Available 24 hours/day Type of Home: House Home Access: Stairs to enter Entrance Stairs-Rails: Right Entrance Stairs-Number of Steps: 3 Home Layout: One level Home Equipment: Walker - 2 wheels;Cane - single point;Bedside commode Additional Comments: home O2    Prior Function Level of Independence: Independent with assistive device(s)         Comments: amb with cane     Hand Dominance   Dominant Hand: Right    Extremity/Trunk Assessment   Upper Extremity Assessment Upper Extremity Assessment: Generalized weakness    Lower Extremity Assessment Lower Extremity Assessment: Generalized weakness       Communication   Communication: No difficulties  Cognition Arousal/Alertness: Awake/alert Behavior During Therapy: WFL for tasks assessed/performed Overall Cognitive Status: No family/caregiver present to determine baseline cognitive functioning                                        General Comments General comments (skin integrity, edema, etc.): Amb on 1L  of O2 with SpO2 >92%    Exercises     Assessment/Plan    PT Assessment Patient needs continued PT services  PT Problem List Decreased strength;Decreased activity tolerance;Decreased balance;Decreased mobility       PT Treatment Interventions DME instruction;Gait training;Functional mobility training;Therapeutic activities;Therapeutic exercise;Balance training;Patient/family education    PT Goals (Current goals can be  found in the Care Plan section)  Acute Rehab PT Goals Patient Stated Goal: return home PT Goal Formulation: With patient Time For Goal Achievement: 02/20/20 Potential to Achieve Goals: Good    Frequency Min 3X/week   Barriers to discharge        Co-evaluation               AM-PAC PT "6 Clicks" Mobility  Outcome Measure Help needed turning from your back to your side while in a flat bed without using bedrails?: None Help needed moving from lying on your back to sitting on the side of a flat bed without using bedrails?: A Little Help needed moving to and from a bed to a chair (including a wheelchair)?: A Little Help needed standing up from a chair using your arms (e.g., wheelchair or bedside chair)?: A Little Help needed to walk in hospital room?: A Little Help needed climbing 3-5 steps with a railing? : A Little 6 Click Score: 19    End of Session Equipment Utilized During Treatment: Gait belt;Oxygen Activity Tolerance: Patient tolerated treatment well Patient left: in bed;with call bell/phone within reach;with bed alarm set Nurse Communication: Mobility status PT Visit Diagnosis: Unsteadiness on feet (R26.81);Muscle weakness (generalized) (M62.81)    Time: 3606-7703 PT Time Calculation (min) (ACUTE ONLY): 22 min   Charges:   PT Evaluation $PT Eval Moderate Complexity: [place] Pager [phone] Office [place] 02/13/2020, 9:55 AM

## 2020-02-13 NOTE — Progress Notes (Addendum)
Progress Note  Patient Name: Crystal [name] Date of Encounter: 02/13/2020  Primary Cardiologist: [name], MD   Subjective  Remains on milrinone 0.125 mcg. CO-OX 67%   Yesterday diuresed with lasix drip + metolazone.   Weight down 6 more pounds.  Creatinine trending down 2>1.8>1.7   Denies SOB. Complaining of constipation.     Inpatient Medications    Scheduled Meds: . amiodarone  200 mg Oral Daily  . Chlorhexidine Gluconate Cloth  6 each Topical Daily  . heparin  5,000 Units Subcutaneous Q8H  . levothyroxine  50 mcg Oral Daily  . metolazone  5 mg Oral Daily  . midodrine  10 mg Oral TID WC  . senna-docusate  1 tablet Oral QHS  . sodium chloride flush  10-40 mL Intracatheter Q12H  . sodium chloride flush  3 mL Intravenous Once  . sodium chloride flush  3 mL Intravenous Q12H  . sorbitol  30 mL Oral Daily   Continuous Infusions: . sodium chloride 10 mL/hr at 02/11/20 0011  . furosemide (LASIX) infusion 20 mg/hr (02/13/20 0523)  . milrinone 0.125 mcg/kg/min (02/12/20 0639)   PRN Meds: sodium chloride, acetaminophen, albuterol, ALPRAZolam, guaiFENesin-dextromethorphan, ondansetron (ZOFRAN) IV, sodium chloride flush, sodium chloride flush   Vital Signs    Vitals:   02/12/20 1900 02/13/20 0300 02/13/20 0400 02/13/20 0728  BP: 106/82  (!) 96/54 103/84  Pulse: 82  72 84  Resp:    18  Temp: 98.1 F (36.7 C)  97.7 F (36.5 C) 98.4 F (36.9 C)  TempSrc: Oral  Oral Oral  SpO2: 97%  97%   Weight:  58.6 kg    Height:        Intake/Output Summary (Last 24 hours) at 02/13/2020 1049 Last data filed at 02/13/2020 1000 Gross per 24 hour  Intake 840 ml  Output 3350 ml  Net -2510 ml   Last 3 Weights 02/13/2020 02/12/2020 02/11/2020  Weight (lbs) 129 lb 1.6 oz 135 lb 5.8 oz 137 lb 5.6 oz  Weight (kg) 58.559 kg 61.4 kg 62.3 kg      Telemetry  SR 70-80s personally checked  Physical Exam  CVP 15  General:  . No resp difficulty HEENT: normal Neck: supple. JVP to  jaw . Carotids 2+ bilat; no bruits. No lymphadenopathy or thryomegaly appreciated. Cor: PMI nondisplaced. Regular rate & rhythm. No rubs, or murmurs. +S3  Lungs: clear Abdomen: soft, nontender, nondistended. No hepatosplenomegaly. No bruits or masses. Good bowel sounds. Extremities: no cyanosis, clubbing, rash, R and LLE 1+ edema Neuro: alert & orientedx3, cranial nerves grossly intact. moves all 4 extremities w/o difficulty. Affect pleasant     Labs    High Sensitivity Troponin:   Recent Labs  Lab 02/05/20 1221 02/05/20 1430  TROPONINIHS 14 13      Chemistry Recent Labs  Lab 02/11/20 0315 02/12/20 0900 02/13/20 0330  NA 129* 128* 130*  K 5.0 3.4* 4.0  CL 94* 88* 90*  CO2 27 29 31   GLUCOSE 113* 202* 132*  BUN 69* 68* 66*  CREATININE 2.00* 1.86* 1.70*  CALCIUM 8.9 9.1 8.9  GFRNONAA 22* 24* 27*  GFRAA 26* 28* 32*  ANIONGAP 8 11 9      Hematology Recent Labs  Lab 02/10/20 0254  WBC 3.1*  RBC 3.54*  HGB 10.6*  HCT 31.9*  MCV 90.1  MCH 29.9  MCHC 33.2  RDW 15.9*  PLT 126*    BNP No results for input(s): BNP, PROBNP in the last 168 hours.  DDimer No results for input(s): DDIMER in the last 168 hours.   Radiology    No results found.  Cardiac Studies   N/a   Patient Profile     86 y.o. female  with PMH of non-ischemic cardiomyopathy, HTN, hypothyroidism, sarcoidosis, and anxiety, who presented for management of her CHF.  Assessment & Plan    1. Acute on Chronic Biventricular Heart Failure w/ Low Output/ Stage D Heart Failure -> cardiogenic shock - NICM dating back to 2010. Echo in 2010 showed EF 15-20%. LHC showed normal cors - Echo 11/24/19 w/ EF25-30% w/ global hypokinesis w/ inferior/inferoseptal akinesis, G2DD but no LVH. RV was mildly enlarged and systolic function moderately reduced. - has known pulmonary sarcoid but no known cardiac involvement  - This is 2nd admission for a/c CHF in last 3 months requiring milrinone - initial co-ox this  admit 36%. Improved w/ milrinone 0.25 but had to cut back to 0.125 with hypotension.  Todays CO-OX is 67%  on milrinone 0.125 mcg. - CVP down to 15. Continue lasix drip + metolazone.  -  she is actively dying from end-stage HF refractory to inotropes.  - Per Dr Haroldine Laws --->I discussed this again with her and her husband and they continue to be in denial about exactly how severe her HF is and her husband said " don't you think we need more time to see how it goes?". I told him that I felt her body was declaring itself as she has had multiple recent admits for HF and now failing inotrope support. I strongly suggested the need for Hospice. He said he wants his son to be here tomorrow to discuss.   2. AKI: - Due to cardiorenal syndrome/shock. Baseline creatinine 1.3-1.4  - Creatinine down to 1.7.   3. Pulmonary Sarcoid - followed by pulmonology  4. PVC - Frequent PVCs noted > 20 per hour. Doubt this is cause of CM as CM is longstanding.  - Stop amiodarone.   5. Hypokalemia  K stable today.   6. Hyponatremia - due to advanced HF -Sodium 130  7. DNR/DNI   Planning for d/c with [facility] once arrangements completed. We will stop milrinone and remove PICC prior to discharge.   Advanced Heart Failure Team Pager [phone] (M-F; [place])  Please contact [facility] for night-coverage after hours (4p -7a ) and weekends on [email or web address]      Signed, Darrick Grinder, NP  02/13/2020, 10:49 AM     Patient seen and examined with the above-signed Advanced Practice Provider and/or Housestaff. I personally reviewed laboratory data, imaging studies and relevant notes. I independently examined the patient and formulated the important aspects of the plan. I have edited the note to reflect any of my changes or salient points. I have personally discussed the plan with the patient and/or family.   Remains very weak and SOB despite milrinone support. Yesterday I increased IV lasix and we  were able to diurese her some more without any symptomatic improvement.   General:  Very weak  Hacking cough  HEENT: normal Neck: supple. JVP to jaw  Carotids 2+ bilat; no bruits. No lymphadenopathy or thryomegaly appreciated. Cor: PMI nondisplaced. Regular rate & rhythm. +s3 Lungs: clear Abdomen: soft, nontender, nondistended. No hepatosplenomegaly. No bruits or masses. Good bowel sounds. Extremities: no cyanosis, clubbing, rash, 2+ edema cool Neuro: alert & orientedx3, cranial nerves grossly intact. moves all 4 extremities w/o difficulty. Affect pleasant  Volume status somewhat improved but still extremely  symptomatic despite milrinone support.   She is clearly end-stage. Long discussion with her and family about transition to Hospice care. They are still sorting this out.   While in house we will continue milrinone and continue attempts at IV diuresis for comfort's sake.   Glori Bickers, MD  4:00 PM

## 2020-02-13 NOTE — Telephone Encounter (Signed)
Jenn from Hospice called in to see if Dr. Charlett Blake could give her a call at [phone] to discuss about being the attending provider.

## 2020-02-13 NOTE — Progress Notes (Signed)
I have reviewed the patient's hospital course, management and laboratories.  Despite IV Lasix and milrinone she remains volume overloaded.  Her renal function has also deteriorated.  I agree with Dr. Haroldine Laws that there are no other good options and that she is nearing end-of-life.  I agree with home hospice care.  I spoke with the patient's husband by phone and also discussed this with her personally.  They both understand and are in agreement with plans as outlined.  I appreciate Dr. Clayborne Dana assistance with her care. [name]

## 2020-02-13 NOTE — Progress Notes (Signed)
CCMD notified that pt had 7 beats wide QRS in SVT. Pt stable, asymptomatic. MD made aware. Will monitor.

## 2020-02-13 NOTE — Progress Notes (Signed)
Manufacturing engineer Documentation  Liaison notes per PMT note that family is still deciding re: home with hospice care.   Liaison left VM with husband to call with any questions he may have.   [facility] will continue to follow pt so that assistance can be given when/if family elects hospice care.   Thank you,  Freddie Breech, RN [facility] Liaison [phone]

## 2020-02-14 LAB — BASIC METABOLIC PANEL
Anion gap: 8 (ref 5–15)
BUN: 64 mg/dL — ABNORMAL HIGH (ref 8–23)
CO2: 33 mmol/L — ABNORMAL HIGH (ref 22–32)
Calcium: 8.6 mg/dL — ABNORMAL LOW (ref 8.9–10.3)
Chloride: 87 mmol/L — ABNORMAL LOW (ref 98–111)
Creatinine, Ser: 1.65 mg/dL — ABNORMAL HIGH (ref 0.44–1.00)
GFR calc Af Amer: 33 mL/min — ABNORMAL LOW (ref >60)
GFR calc non Af Amer: 28 mL/min — ABNORMAL LOW (ref >60)
Glucose, Bld: 97 mg/dL (ref 70–99)
Potassium: 3.6 mmol/L (ref 3.5–5.1)
Sodium: 128 mmol/L — ABNORMAL LOW (ref 135–145)

## 2020-02-14 LAB — COOXEMETRY PANEL
Carboxyhemoglobin: 1.6 % — ABNORMAL HIGH (ref 0.5–1.5)
Methemoglobin: 0.6 % (ref 0.0–1.5)
O2 Saturation: 68.2 %
Total hemoglobin: 10.6 g/dL — ABNORMAL LOW (ref 12.0–16.0)

## 2020-02-14 MED ORDER — TORSEMIDE 20 MG PO TABS: 80.0000 mg | ORAL_TABLET | Freq: Every day | ORAL | 6 refills | Status: AC

## 2020-02-14 MED ORDER — POTASSIUM CHLORIDE CRYS ER 20 MEQ PO TBCR: 20.0000 meq | EXTENDED_RELEASE_TABLET | Freq: Every day | ORAL | Status: DC

## 2020-02-14 MED ORDER — TORSEMIDE 20 MG PO TABS: 80.0000 mg | ORAL_TABLET | Freq: Every day | ORAL | Status: DC

## 2020-02-14 MED ORDER — MIDODRINE HCL 10 MG PO TABS: 10.0000 mg | ORAL_TABLET | Freq: Three times a day (TID) | ORAL | 6 refills | Status: AC

## 2020-02-14 MED ORDER — OXYCODONE HCL 5 MG PO TABS: 5.0000 mg | ORAL_TABLET | Freq: Four times a day (QID) | ORAL | Status: DC | PRN

## 2020-02-14 MED ORDER — ALPRAZOLAM 0.25 MG PO TABS: 0.2500 mg | ORAL_TABLET | Freq: Three times a day (TID) | ORAL | Status: DC | PRN

## 2020-02-14 MED ORDER — OXYCODONE HCL 5 MG PO TABS: 5.0000 mg | ORAL_TABLET | Freq: Four times a day (QID) | ORAL | 0 refills | Status: AC | PRN

## 2020-02-14 MED ORDER — POTASSIUM CHLORIDE CRYS ER 20 MEQ PO TBCR: 20.0000 meq | EXTENDED_RELEASE_TABLET | Freq: Every day | ORAL | 6 refills | Status: AC

## 2020-02-14 MED ORDER — POTASSIUM CHLORIDE CRYS ER 20 MEQ PO TBCR
40.0000 meq | EXTENDED_RELEASE_TABLET | Freq: Once | ORAL | Status: AC
  Administered 2020-02-14: 40 meq via ORAL
  Filled 2020-02-14: qty 2

## 2020-02-14 MED ORDER — SENNOSIDES-DOCUSATE SODIUM 8.6-50 MG PO TABS: 1.0000 | ORAL_TABLET | Freq: Two times a day (BID) | ORAL | 6 refills | Status: DC

## 2020-02-14 NOTE — TOC Initial Note (Addendum)
Transition of Care [facility]) - Initial/Assessment Note    Patient Details  Name: Crystal [name] MRN: [id number] Date of Birth: 05/07/1934  Transition of Care [facility]) CM/SW Contact:    Zenon Mayo, RN Phone Number: 02/14/2020, 10:16 AM  Clinical Narrative:                 Patient High risk score changed from 21 to 28 today.  Patient is to be dc home with hospice.  NCM received call from [facility] with [facility], states she received a call from spouse stating they want patient to come home with hospice.  Velta Addison states they will have DME delivered this morning, they will need hospital bed and oxygen and w/chair.  NCM spoke with son , Darnell Level to confirm, he will contact this NCM when DME is delivered to home.  Since she is on oxygen will transport via ptar.   Expected Discharge Plan: Home w Hospice Care Barriers to Discharge: No Barriers Identified   Patient Goals and CMS Choice Patient states their goals for this hospitalization and ongoing recovery are:: get better CMS Medicare.gov Compare Post Acute Care list provided to:: Patient Represenative (must comment) Choice offered to / list presented to : Spouse, Adult Children  Expected Discharge Plan and Services Expected Discharge Plan: Home w Hospice Care   Discharge Planning Services: CM Consult Post Acute Care Choice: Hospice Living arrangements for the past 2 months: Single Family Home                 DME Arranged: ([facility] will supply DME) DME Agency: NA       HH Arranged: RN [place] Agency: Teacher, early years/pre) Date HH Agency Contacted: 02/13/20 Time HH Agency Contacted: 27 Representative spoke with at [place]: Jennifer/Maryann  Prior Living Arrangements/Services Living arrangements for the past 2 months: [place] with:: Spouse Patient language and need for interpreter reviewed:: Yes Do you feel safe going back to the place where you live?: Yes      Need for Family Participation in Patient Care: Yes  (Comment) Care giver support system in place?: Yes (comment)   Criminal Activity/Legal Involvement Pertinent to Current Situation/Hospitalization: No - Comment as needed  Activities of Daily Living Home Assistive Devices/Equipment: Grab bars around toilet ADL Screening (condition at time of admission) Patient's cognitive ability adequate to safely complete daily activities?: Yes Is the patient deaf or have difficulty hearing?: No Does the patient have difficulty seeing, even when wearing glasses/contacts?: No Does the patient have difficulty concentrating, remembering, or making decisions?: No Patient able to express need for assistance with ADLs?: Yes Does the patient have difficulty dressing or bathing?: Yes Independently performs ADLs?: Yes (appropriate for developmental age) Does the patient have difficulty walking or climbing stairs?: Yes Weakness of Legs: Both Weakness of Arms/Hands: None  Permission Sought/Granted                  Emotional Assessment Appearance:: Appears stated age Attitude/Demeanor/Rapport: Gracious Affect (typically observed): Appropriate Orientation: : Oriented to Self, Oriented to Place, Oriented to  Time, Oriented to Situation Alcohol / Substance Use: Not Applicable Psych Involvement: No (comment)  Admission diagnosis:  Acute on chronic combined systolic and diastolic CHF (congestive heart failure) ([place]) [I50.43] Acute on chronic congestive heart failure, unspecified heart failure type ([place]) [I50.9] Patient Active Problem List   Diagnosis Date Noted  . Palliative care encounter   . Acute on chronic combined systolic and diastolic CHF (congestive heart failure) ([place]) 02/05/2020  . Edema  01/31/2020  . Constipation 12/17/2019  . NSVT (nonsustained ventricular tachycardia) ([place])   . CHF (congestive heart failure) ([place]) 11/21/2019  . Hypoxia 05/29/2019  . Mediastinal adenopathy 05/24/2019  . Acute on chronic respiratory failure with hypoxemia  ([place]) 01/12/2019  . CKD stage G3a/A1, GFR 45-59 and albumin creatinine ratio <30 mg/g 11/30/2018  . Acute on chronic combined systolic (congestive) and diastolic (congestive) heart failure ([place]) 11/29/2018  . Cavitating mass in left upper lung lobe 06/29/2018  . Hilar adenopathy 06/29/2018  . Sarcoidosis 06/22/2018  . IBS (irritable bowel syndrome) 02/21/2018  . Anemia 08/21/2017  . Hypercalcemia 04/20/2017  . Diffuse large B-cell lymphoma of lymph nodes of neck ([place]) 01/21/2017  . Mitral valve regurgitation, Moderate to severe 11/26/2015  . Chronic systolic heart failure ([place]) 11/26/2015  . Hyperlipidemia, mixed 10/17/2015  . Vitamin D deficiency 10/17/2015  . History of angioedema 06/11/2015  . Hematuria 06/11/2015  . Preventative health care 12/14/2014  . Hyperglycemia 03/24/2012  . Diverticulosis 10/01/2011  . Thrombocytopenia ([place]) 10/02/2010  . Hypothyroidism 01/24/2010  . RHEUMATOID FACTOR, POSITIVE 01/24/2010  . Non Hodgkin's lymphoma ([place]) 01/09/2010  . RAYNAUDS SYNDROME 01/09/2010  . Anxiety state 09/20/2009  . Essential hypertension 09/20/2009  . Nonischemic cardiomyopathy ([place]) 09/20/2009  . Congestive heart failure ([place]) 09/20/2009   PCP:  Mosie Lukes, MD Pharmacy:   [facility] [address] [place] [address] Phone: [phone] Fax: [phone]     Social Determinants of Health (SDOH) Interventions    Readmission Risk Interventions Readmission Risk Prevention Plan 02/14/2020  Transportation Screening Complete  PCP or Specialist Appt within 3-5 Days Complete  HRI or [place] Complete  Social Work Consult for [place] Planning/Counseling Complete  Palliative Care Screening Complete  Medication Review Press photographer) Complete  Some recent data might be hidden

## 2020-02-14 NOTE — Progress Notes (Addendum)
Hydrologist [facility]) Hospital Liaison: RN note     Spoke with Patient's son Darnell Level to confirm family wants home hospice.     Please send signed and completed DNR form home with patient/family. Patient will need prescriptions for discharge comfort medications.      DME needs have been discussed, patient currently has the following equipment in the home:       [place], [place].  Patient/family requests the following DME for delivery to the home:  Hospital bed, W/C and oxygen. [place] equipment manager has been notified and will contact DME provider to arrange delivery to the home. Home address has been verified and is correct in the chart.            Darnell Level is the family member to contact to arrange time of delivery.      [facility] aware of the above. Please notify [facility] when patient is ready to leave the unit at discharge. (Call [phone] or [phone] after 5pm.) [facility] information and contact numbers given to Bruce.       Please call with any hospice related questions.      Thank you for this referral.      Farrel Gordon, RN, [facility] (listed on [place] under [place])   [phone]

## 2020-02-14 NOTE — Progress Notes (Addendum)
Progress Note  Patient Name: Crystal [name] Date of Encounter: 02/14/2020  Primary Cardiologist: [name], MD   Subjective  Remains on milrinone 0.125 mcg. CO-OX 68%   Yesterday she continues to diurese with lasix drip 20 mg/hour.  Brisk diuresis noted.  Weight down another 5 pounds.  Creatinine trending down 2>1.8>1.7 >1.65    Wants to go home. Denies SOB   Inpatient Medications    Scheduled Meds: . Chlorhexidine Gluconate Cloth  6 each Topical Daily  . heparin  5,000 Units Subcutaneous Q8H  . levothyroxine  50 mcg Oral Daily  . metolazone  5 mg Oral Daily  . midodrine  10 mg Oral TID WC  . senna-docusate  1 tablet Oral BID  . sodium chloride flush  10-40 mL Intracatheter Q12H  . sodium chloride flush  3 mL Intravenous Once  . sodium chloride flush  3 mL Intravenous Q12H  . sorbitol  30 mL Oral Daily   Continuous Infusions: . sodium chloride 10 mL/hr at 02/11/20 0011  . furosemide (LASIX) infusion 20 mg/hr (02/14/20 0824)  . milrinone 0.125 mcg/kg/min (02/14/20 0147)   PRN Meds: sodium chloride, acetaminophen, albuterol, ALPRAZolam, guaiFENesin-dextromethorphan, ondansetron (ZOFRAN) IV, sodium chloride flush, sodium chloride flush   Vital Signs    Vitals:   02/14/20 0400 02/14/20 0427 02/14/20 0800 02/14/20 0802  BP: (!) 96/52  113/60   Pulse: 74  74   Resp:      Temp: 98.3 F (36.8 C)   98.1 F (36.7 C)  TempSrc: Oral   Oral  SpO2: 97%  97%   Weight:  54.9 kg    Height:        Intake/Output Summary (Last 24 hours) at 02/14/2020 1036 Last data filed at 02/14/2020 0700 Gross per 24 hour  Intake 700 ml  Output 3450 ml  Net -2750 ml   Last 3 Weights 02/14/2020 02/13/2020 02/12/2020  Weight (lbs) 121 lb 129 lb 1.6 oz 135 lb 5.8 oz  Weight (kg) 54.885 kg 58.559 kg 61.4 kg      Telemetry  SR 70-80s  Personally reviewed   Physical Exam  CVP 13  General:  Elderly weak appearing. Mildly dyspneic HEENT: normal anictreic  Neck: supple. 11-12 .  Carotids 2+ bilat; no bruits. No lymphadenopathy or thryomegaly appreciated. Cor: PMI nondisplaced. Regular rate & rhythm. 2/6 TR +S3  Lungs: clear Abdomen: soft, nontender, nondistended. No hepatosplenomegaly. No bruits or masses. Good bowel sounds. Extremities: no cyanosis, clubbing, rash, R and LLE 2+ edema  Neuro: alert & oriented x 3, cranial nerves grossly intact. moves all 4 extremities w/o difficulty. Affect pleasant   Labs    High Sensitivity Troponin:   Recent Labs  Lab 02/05/20 1221 02/05/20 1430  TROPONINIHS 14 13      Chemistry Recent Labs  Lab 02/12/20 0900 02/13/20 0330 02/14/20 0400  NA 128* 130* 128*  K 3.4* 4.0 3.6  CL 88* 90* 87*  CO2 29 31 33*  GLUCOSE 202* 132* 97  BUN 68* 66* 64*  CREATININE 1.86* 1.70* 1.65*  CALCIUM 9.1 8.9 8.6*  GFRNONAA 24* 27* 28*  GFRAA 28* 32* 33*  ANIONGAP 11 9 8      Hematology Recent Labs  Lab 02/10/20 0254  WBC 3.1*  RBC 3.54*  HGB 10.6*  HCT 31.9*  MCV 90.1  MCH 29.9  MCHC 33.2  RDW 15.9*  PLT 126*    BNP No results for input(s): BNP, PROBNP in the last 168 hours.   DDimer No  results for input(s): DDIMER in the last 168 hours.   Radiology    No results found.  Cardiac Studies   N/a   Patient Profile     86 y.o. female  with PMH of non-ischemic cardiomyopathy, HTN, hypothyroidism, sarcoidosis, and anxiety, who presented for management of her CHF.  Assessment & Plan    1. Acute on Chronic Biventricular Heart Failure w/ Low Output/ Stage D Heart Failure -> cardiogenic shock - NICM dating back to 2010. Echo in 2010 showed EF 15-20%. LHC showed normal cors - Echo 11/24/19 w/ EF25-30% w/ global hypokinesis w/ inferior/inferoseptal akinesis, G2DD but no LVH. RV was mildly enlarged and systolic function moderately reduced. - has known pulmonary sarcoid but no known cardiac involvement  - This is 2nd admission for a/c CHF in last 3 months requiring milrinone - initial co-ox this admit 36%. Improved w/  milrinone 0.25 but had to cut back to 0.125 with hypotension.  Todays CO-OX 68% on milrinone 0.125 mcg. Plan for d/c today. Stop milrinone and remove PICC line.  - Stop lasix drip. Start torsemide 80 mg daily tomorrow.  -  she is actively dying from end-stage HF refractory to inotropes. - Per Dr Haroldine Laws --->I discussed this again with her and her husband and they continue to be in denial about exactly how severe her HF is and her husband said " don't you think we need more time to see how it goes?". I told him that I felt her body was declaring itself as she has had multiple recent admits for HF and now failing inotrope support. I strongly suggested the need for Hospice. He said he wants his son to be here tomorrow to discuss.   2. AKI: - Due to cardiorenal syndrome/shock. Baseline creatinine 1.3-1.4  - Creatinine 1.65 .   3. Pulmonary Sarcoid - followed by pulmonology  4. PVC - Frequent PVCs noted > 20 per hour. Doubt this is cause of CM as CM is longstanding.  - Stop amiodarone.   5. Hypokalemia  K 3.6   6. Hyponatremia - due to advanced HF -Sodium 130  7. DNR/DNI  Completed DNR by Hospice.   Planning for d/c with [facility] today. I have reached out to [facility] today and discussed plan. Discharge today and Hospice will see today. DME has been ordered.    We will stop milrinone and remove PICC now    Advanced Heart Failure Team Pager [phone] (M-F; 7a - 4p)  Please contact [facility] for night-coverage after hours (4p -7a ) and weekends on [email or web address]      Signed, Darrick Grinder, NP  02/14/2020, 10:36 AM    Patient seen and examined with the above-signed Advanced Practice Provider and/or Housestaff. I personally reviewed laboratory data, imaging studies and relevant notes. I independently examined the patient and formulated the important aspects of the plan. I have edited the note to reflect any of my changes or salient points. I have personally  discussed the plan with the patient and/or family.  Diuresing well on milrinone and high-dose IV lasix but still quite symptomatic and very weak. Co-ox improved.   Plans in place for Home Hospice.  Agree with pulling PICC and d/cing home. D/c meds discussed.   Glori Bickers, MD  12:31 PM

## 2020-02-14 NOTE — Progress Notes (Signed)
CCMD notified that pt had 8 beats wide QRS/VTach. Pt asymptomatic and stable. Provider notified. Will monitor.

## 2020-02-14 NOTE — Telephone Encounter (Signed)
Jenn from [facility] called in to see if Dr. Charlett Blake could be an attending record for the patient. Please call Jenn asap at [phone] thanks,

## 2020-02-14 NOTE — Progress Notes (Signed)
Reviewed AVS with patient, patient's son and patient's husband at 54. All questions answered. Prescription provided. Site of PICC line removal assessed; no drainage, dressing clean dry and intact, no complaints of pain.   Patient escorted to main entrance for discharge via wheelchair by RN. Patient transported home via private auto.

## 2020-02-14 NOTE — Discharge Summary (Addendum)
Advanced Heart Failure Team  Discharge Summary   Patient ID: Crystal [name] MRN: [id number], DOB/AGE: [birth year] 86 y.o. Admit date: 02/05/2020 D/C date:    02/14/2020    Primary Discharge Diagnoses:  1. Acute on Chronic Biventricular Heart Failure w/ Low Output/ Stage D Heart Failure -> cardiogenic shock 2. AKI: 3. Pulmonary Sarcoid 4. PVC 5. Hypokalemia  6. Hyponatremia 7. DNR/DNI  Completed DNR by Hospice.    Hospital Course:  Crystal [name] is a 86 y/o woman with h/o hypertension, anxiety, lymphoma treated with chemo and XRT in 3570, chronic systolic CHF 2/2 NICM that was first diagnosed in 08/2009. EF at that time was 15-20% and LHC showed normal coronaries. Also has pulmonary sarcoidosis that is followed by pulmonology. Uncertain if cardiac involvement.    She was recently admitted 1/21 for a/c CHF w/ marked volume overload. She was managed by general cardiology and required milrinone and high dose diuretics for diuresis. Echo showed LVEF 30-35% w/ global hypokinesis w/ inferior/inferoseptal akinesis, G2DD but no LVH. RV was mildly enlarged and systolic function moderately reduced. There was concern for possible LV thrombus on TTE. TEE was performed and negative for thombus.      Admitted with a/c CHF. Markedly volume overloaded on admit. SCr worsened w/ attempts at diuresis, raising concern for low output. PICC line placed and initial Co-ox was 36%. Was started on milrinone 0.25 mcg. Diuresed with IV lasix and transitioned to torsemide. Milrinone was stopped prior to discharge.   She was not a candidate for advanced therapy given severe biventricular heart failure. Palliative Care met with patient and family. They elected comfort care and discharge with hospice. She requested to discharge with home with family to provide care.    See below for detailed problem list.   1.  Acute on Chronic Biventricular Heart Failure w/ Low Output/ Stage D Heart Failure -> cardiogenic shock - NICM dating  back to 2010. Echo in 2010 showed EF 15-20%. LHC showed normal cors - Echo 11/24/19 w/ EF 25-30% w/ global hypokinesis w/ inferior/inferoseptal akinesis, G2DD but no LVH. RV was mildly enlarged and systolic function moderately reduced. - has known pulmonary sarcoid but no known cardiac involvement  - This was the 2nd admission for a/c CHF in last 3 months requiring milrinone - initial co-ox this admit 36%. Improved w/ milrinone 0.25 but had to cut back to 0.125 with hypotension. Todays CO-OX 68% on milrinone 0.125 mcg. Milrinone stopped prior to discharge and PICC line was removed. .   - Stop lasix drip. Start torsemide 80 mg daily tomorrow.  -  she is actively dying from end-stage HF refractory to inotropes. - Per Dr Haroldine Laws --->I discussed this again with her and her husband and they continue to be in denial about exactly how severe her HF is and her husband said " don't you think we need more time to see how it goes?". I told him that I felt her body was declaring itself as she has had multiple recent admits for HF and now failing inotrope support. I strongly suggested the need for Hospice. He said he wants his son to be here tomorrow to discuss.    2. AKI: - Due to cardiorenal syndrome/shock. Baseline creatinine 1.3-1.4  - Creatinine peaked at 2.  - On the day of discharge creatinine was 1.65.    3. Pulmonary Sarcoid - followed by pulmonology    4. PVC - Frequent PVCs noted > 20 per hour. Doubt this is cause of CM  as CM is longstanding.  - Stop amiodarone.    5. Hypokalemia  K 3.6    6. Hyponatremia - due to advanced HF -Sodium 128 at discharged.    7. DNR/DNI  Completed DNR by Hospice.    [facility]  I have reached out to Hospice liason today and discussed plan. Discharge today and Hospice will see today. DME has been ordered.   Discharge Vitals: Blood pressure 100/62, pulse 77, temperature 98.1 F (36.7 C), temperature source Oral, resp. rate  18, height 5' 3" (1.6 m), weight 54.9 kg, SpO2 100 %.  Labs: Lab Results  Component Value Date   WBC 3.1 (L) 02/10/2020   HGB 10.6 (L) 02/10/2020   HCT 31.9 (L) 02/10/2020   MCV 90.1 02/10/2020   PLT 126 (L) 02/10/2020    Recent Labs  Lab 02/14/20 0400  NA 128*  K 3.6  CL 87*  CO2 33*  BUN 64*  CREATININE 1.65*  CALCIUM 8.6*  GLUCOSE 97   Lab Results  Component Value Date   CHOL 136 05/29/2019   HDL 51.60 05/29/2019   LDLCALC 72 05/29/2019   TRIG 65.0 05/29/2019   BNP (last 3 results) Recent Labs    11/21/19 1321 02/05/20 1332  BNP 3,536.1* 3,829.9*    ProBNP (last 3 results) Recent Labs    10/20/19 1032 11/07/19 0943  PROBNP 13,688* 12,815*     Diagnostic Studies/Procedures   No results found.  Discharge Medications   Allergies as of 02/14/2020       Reactions   Hydralazine Hcl Itching, Other (See Comments)   Hypotension and caused SEVERE ITCHING   Lisinopril Swelling, Other (See Comments)   Facial and lip swelling   Iohexol Itching           Medication List     STOP taking these medications    carvedilol 3.125 MG tablet Commonly known as: COREG   CENTRUM PO   docusate sodium 100 MG capsule Commonly known as: COLACE   Fish Oil 1000 MG Caps   furosemide 40 MG tablet Commonly known as: LASIX   isosorbide mononitrate 30 MG 24 hr tablet Commonly known as: IMDUR   spironolactone 25 MG tablet Commonly known as: ALDACTONE       TAKE these medications    acetaminophen 325 MG tablet Commonly known as: TYLENOL Take 2 tablets (650 mg total) by mouth every 6 (six) hours as needed for fever, headache, mild pain or moderate pain.   albuterol 108 (90 Base) MCG/ACT inhaler Commonly known as: VENTOLIN HFA INHALE 1 TO 2 PUFFS BY MOUTH EVERY 4 HOURS AS NEEDED FOR WHEEZING FOR SHORTNESS OF BREATH What changed: See the new instructions.   ALPRAZolam 0.25 MG tablet Commonly known as: XANAX Take 1 tablet by mouth twice daily as needed  for anxiety What changed:  reasons to take this additional instructions   benzonatate 100 MG capsule Commonly known as: TESSALON TAKE 1 TO 2 CAPSULES BY MOUTH THREE TIMES DAILY AS NEEDED FOR COUGH What changed: See the new instructions.   Euthyrox 50 MCG tablet Generic drug: levothyroxine Take 50 mcg by mouth daily before breakfast.   midodrine 10 MG tablet Commonly known as: PROAMATINE Take 1 tablet (10 mg total) by mouth 3 (three) times daily with meals.   oxyCODONE 5 MG immediate release tablet Commonly known as: Oxy IR/ROXICODONE Take 1 tablet (5 mg total) by mouth every 6 (six) hours as needed for moderate pain (dyspnea Hospice   patient).   potassium chloride SA 20 MEQ tablet Commonly known as: KLOR-CON Take 1 tablet (20 mEq total) by mouth daily. Start taking on: February 15, 2020 What changed:  when to take this reasons to take this   PROBIOTIC DAILY PO Take 1 capsule by mouth daily.   senna-docusate 8.6-50 MG tablet Commonly known as: Senokot-S Take 1 tablet by mouth 2 (two) times daily.   torsemide 20 MG tablet Commonly known as: DEMADEX Take 4 tablets (80 mg total) by mouth daily. Start taking on: February 15, 2020        Disposition   The patient will be discharged in stable condition to home. Discharge Instructions     Diet - low sodium heart healthy   Complete by: As directed    Increase activity slowly   Complete by: As directed    STOP any activity that causes chest pain, shortness of breath, dizziness, sweating, or exessive weakness   Complete by: As directed       Follow-up Information     Ennever, Peter R, MD.   Specialty: Oncology Why: office will call and setup a appointment  Contact information: [address] [place] [address] [phone]              Duration of Discharge Encounter: Greater than 35 minutes   Signed, Amy Clegg NP-C  02/14/2020, 11:46 AM  Patient seen and examined with the above-signed  Advanced Practice Provider and/or Housestaff. I personally reviewed laboratory data, imaging studies and relevant notes. I independently examined the patient and formulated the important aspects of the plan. I have edited the note to reflect any of my changes or salient points. I have personally discussed the plan with the patient and/or family.  She has end-stage systolic HF with minimal benefit from inotrope support. Plan is to discharge home with Hospice support.   Daniel Bensimhon, MD  10:17 AM   

## 2020-02-14 NOTE — Progress Notes (Signed)
PMT provider chart review. Discussed with hospice liaison, Farrel Gordon. Plan is for home with hospice services today. Goals are clear. Recommend prn Xanax for anxiety and prn Oxycodone for pain/dyspnea on discharge summary. Hospice will further manage comfort medications once patient returns home. Thank you.   NO CHARGE  Ihor Dow, [place], FNP-C Palliative Medicine Team  Phone: [phone] Fax: [phone]

## 2020-02-15 NOTE — Telephone Encounter (Signed)
Please advise 

## 2020-02-16 NOTE — Telephone Encounter (Signed)
Called [facility] and agreed to be doctor of record for patient

## 2020-02-21 ENCOUNTER — Other Ambulatory Visit: Payer: Self-pay | Admitting: Adult Health

## 2020-02-21 DIAGNOSIS — I5022 Chronic systolic (congestive) heart failure: Secondary | ICD-10-CM

## 2020-02-21 DIAGNOSIS — R05 Cough: Secondary | ICD-10-CM

## 2020-02-21 DIAGNOSIS — R609 Edema, unspecified: Secondary | ICD-10-CM

## 2020-02-21 DIAGNOSIS — I10 Essential (primary) hypertension: Secondary | ICD-10-CM

## 2020-02-21 DIAGNOSIS — R059 Cough, unspecified: Secondary | ICD-10-CM

## 2020-02-21 DIAGNOSIS — I428 Other cardiomyopathies: Secondary | ICD-10-CM

## 2020-02-24 ENCOUNTER — Other Ambulatory Visit (HOSPITAL_COMMUNITY): Payer: Self-pay | Admitting: Adult Health

## 2020-03-08 ENCOUNTER — Telehealth: Payer: Self-pay | Admitting: Family Medicine

## 2020-03-08 NOTE — Telephone Encounter (Signed)
CallerTori Milks  Call Back # [phone] OR  [phone]  Subject: [employer]   Patient's daughter is calling in regards to Dr Charlett Blake filling out [employer] paper work for her leave of absent. Patient is be admitted to [facility].  Please Advise

## 2020-03-09 ENCOUNTER — Other Ambulatory Visit (HOSPITAL_COMMUNITY): Payer: Self-pay | Admitting: Adult Health

## 2020-03-11 NOTE — Telephone Encounter (Signed)
Spoke with daughter and she will be sending in her paperwork to Dr. Charlett Blake to fill out.  Dr. Patrici Ranks for paperwork to be filled out.

## 2020-03-12 NOTE — Telephone Encounter (Signed)
Paperwork received.  Placed in folder for provider to complete.

## 2020-03-15 ENCOUNTER — Telehealth: Payer: Self-pay | Admitting: Internal Medicine

## 2020-03-15 ENCOUNTER — Telehealth: Payer: Self-pay

## 2020-03-15 NOTE — Telephone Encounter (Signed)
Please start a condolence card to send to the family

## 2020-03-15 NOTE — Telephone Encounter (Signed)
After Hours Death Notification from Hospice. Pt passed away 2020/03/23.

## 2020-03-15 NOTE — Telephone Encounter (Signed)
Crystal [name] with [facility] called to notify: pt passed away - pronounced 1750 28-Mar-2020.  Family aware.

## 2020-03-16 DEATH — deceased

## 2020-03-18 NOTE — Telephone Encounter (Signed)
Left message on machine that paperwork was faxed over to human resources    [name] Leave of absence Coordinator Benefits Department [facility] [address] [place], [place] [address]  Phone: [phone] Fax: [phone] Email Address: [email or web address]

## 2020-03-19 ENCOUNTER — Telehealth: Payer: Self-pay | Admitting: Family Medicine

## 2020-03-19 NOTE — Telephone Encounter (Signed)
Funeral home called and notified that death certificate is ready for pickup

## 2020-03-19 NOTE — Telephone Encounter (Signed)
[employer] dropped off Death Certificate to be filled out and to call them at [phone] when document ready to pick up. Document given to CMA.

## 2020-04-30 ENCOUNTER — Telehealth: Payer: Self-pay | Admitting: Family Medicine

## 2020-04-30 NOTE — Telephone Encounter (Signed)
Caller : [facility] Call Back # [phone] Fax Number # [phone]  Per [facility] sent our office a medical order to be signed by Patient's provider on April 24,2021. Per [facility] records they have never received the signed order back.  Please Advise

## 2020-05-28 ENCOUNTER — Telehealth: Payer: Self-pay | Admitting: Family Medicine

## 2020-05-28 NOTE — Telephone Encounter (Signed)
Caller:Brenda Whitesell-[facility] Call Back # [phone]  Per brenda, they did not receive copy of Supplemental Medical order back in April 2021.  [facility] faxed order to [facility]

## 2020-06-17 ENCOUNTER — Telehealth: Payer: Self-pay | Admitting: Family Medicine

## 2020-06-17 NOTE — Telephone Encounter (Signed)
Per Hassan Rowan with  [phone]) Their office sent an order for a Supplemental Order and they have received it back yet. Order for 03/08/2020.   Per Hassan Rowan, she will re-send the order for providers signature.

## 2020-06-18 NOTE — Telephone Encounter (Addendum)
Left message on machine that I have not received order yet.

## 2020-06-21 NOTE — Telephone Encounter (Signed)
Order refaxed over.

## 2020-06-21 NOTE — Telephone Encounter (Signed)
Spoke with Crystal [name] in medical records and she stated that she was resending the order back over.

## 2020-06-25 ENCOUNTER — Other Ambulatory Visit: Payer: Medicare Other

## 2020-06-25 ENCOUNTER — Ambulatory Visit: Payer: Medicare Other | Admitting: Hematology & Oncology
